# Patient Record
Sex: Female | Born: 1960
Health system: Southern US, Community
[De-identification: ages and names within clinical notes are randomized; demographics above are authoritative.]

## PROBLEM LIST (undated history)

## (undated) DIAGNOSIS — R569 Unspecified convulsions: Secondary | ICD-10-CM

## (undated) DIAGNOSIS — Z789 Other specified health status: Secondary | ICD-10-CM

## (undated) DIAGNOSIS — F209 Schizophrenia, unspecified: Secondary | ICD-10-CM

## (undated) DIAGNOSIS — F419 Anxiety disorder, unspecified: Secondary | ICD-10-CM

## (undated) DIAGNOSIS — I10 Essential (primary) hypertension: Secondary | ICD-10-CM

## (undated) DIAGNOSIS — F70 Mild intellectual disabilities: Secondary | ICD-10-CM

## (undated) DIAGNOSIS — E119 Type 2 diabetes mellitus without complications: Secondary | ICD-10-CM

## (undated) DIAGNOSIS — E785 Hyperlipidemia, unspecified: Secondary | ICD-10-CM

## (undated) DIAGNOSIS — R1310 Dysphagia, unspecified: Secondary | ICD-10-CM

## (undated) HISTORY — DX: Schizophrenia, unspecified: F20.9

## (undated) HISTORY — PX: LEG SURGERY: SHX1003

## (undated) HISTORY — DX: Hyperlipidemia, unspecified: E78.5

## (undated) HISTORY — DX: Anxiety disorder, unspecified: F41.9

## (undated) HISTORY — DX: Type 2 diabetes mellitus without complications: E11.9

---

## 2000-09-13 ENCOUNTER — Inpatient Hospital Stay (HOSPITAL_COMMUNITY): Admission: EM | Admit: 2000-09-13 | Discharge: 2000-09-19 | Payer: Self-pay | Admitting: *Deleted

## 2001-05-02 ENCOUNTER — Ambulatory Visit (HOSPITAL_COMMUNITY): Admission: RE | Admit: 2001-05-02 | Discharge: 2001-05-02 | Payer: Self-pay | Admitting: Obstetrics and Gynecology

## 2001-05-09 ENCOUNTER — Encounter: Payer: Self-pay | Admitting: Obstetrics and Gynecology

## 2001-05-09 ENCOUNTER — Ambulatory Visit (HOSPITAL_COMMUNITY): Admission: RE | Admit: 2001-05-09 | Discharge: 2001-05-09 | Payer: Self-pay | Admitting: Obstetrics and Gynecology

## 2001-08-09 ENCOUNTER — Other Ambulatory Visit: Admission: RE | Admit: 2001-08-09 | Discharge: 2001-08-09 | Payer: Self-pay | Admitting: Obstetrics and Gynecology

## 2003-01-07 ENCOUNTER — Encounter: Admission: RE | Admit: 2003-01-07 | Discharge: 2003-01-07 | Payer: Self-pay | Admitting: Family Medicine

## 2003-01-07 ENCOUNTER — Encounter: Payer: Self-pay | Admitting: Family Medicine

## 2013-12-26 ENCOUNTER — Ambulatory Visit: Payer: Self-pay | Admitting: Gastroenterology

## 2014-01-13 ENCOUNTER — Ambulatory Visit (INDEPENDENT_AMBULATORY_CARE_PROVIDER_SITE_OTHER): Payer: Medicare Other | Admitting: Gastroenterology

## 2014-01-13 ENCOUNTER — Encounter: Payer: Self-pay | Admitting: Gastroenterology

## 2014-01-13 ENCOUNTER — Encounter (INDEPENDENT_AMBULATORY_CARE_PROVIDER_SITE_OTHER): Payer: Self-pay

## 2014-01-13 VITALS — BP 135/80 | HR 87 | Temp 98.4°F | Ht 63.0 in | Wt 154.4 lb

## 2014-01-13 DIAGNOSIS — K625 Hemorrhage of anus and rectum: Secondary | ICD-10-CM | POA: Insufficient documentation

## 2014-01-13 DIAGNOSIS — R1310 Dysphagia, unspecified: Secondary | ICD-10-CM | POA: Insufficient documentation

## 2014-01-13 NOTE — Progress Notes (Signed)
Primary Care Physician:  Toma Deiters, MD Primary Gastroenterologist:  Dr. Darrick Penna   Chief Complaint  Patient presents with  . Colonoscopy    HPI:   Natasha Martin presents today at the request of Coralee Rud, NP, for initial screening colonoscopy. She has a history of schizophrenia and resides at a family care facility in [place]. She is a poor historian and difficult to keep focused on visit at hand.  Occasional low-volume hematochezia. Unable to get accurate picture from patient. Sometimes gets choked on her blue pill. Solid food and liquid dysphagia. X 2 years per patient. No change in bowel habits. No N/V, abdominal pain. Mother is power of attorney.   Past Medical History  Diagnosis Date  . Diabetes   . Hyperlipidemia   . Schizophrenia   . Anxiety     Past Surgical History  Procedure Laterality Date  . 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 aspirin 81 MG tablet Take 81 mg by mouth daily.      Marland Kitchen atenolol (TENORMIN) 25 MG tablet Take 25 mg by mouth daily.       . benztropine (COGENTIN) 2 MG tablet Take 2 mg by mouth daily.       . carbamazepine (TEGRETOL) 200 MG tablet Take 200 mg by mouth 3 (three) times daily.       . clotrimazole-betamethasone (LOTRISONE) cream Apply 1 application topically.       . CRESTOR 20 MG tablet Take 20 mg by mouth daily.       . diazepam (VALIUM) 10 MG tablet Take 10 mg by mouth every 6 (six) hours as needed.       . fenofibrate (TRICOR) 145 MG tablet Take 145 mg by mouth daily.       Marland Kitchen glimepiride (AMARYL) 4 MG tablet Take 4 mg by mouth daily with breakfast.       . glyBURIDE (DIABETA) 5 MG tablet 5 mg daily with breakfast.       . haloperidol (HALDOL) 10 MG tablet Take 10 mg by mouth 2 (two) times daily.       Marland Kitchen LORazepam (ATIVAN) 1 MG tablet Take 1 mg by mouth.       . metFORMIN (GLUCOPHAGE) 1000 MG tablet 1,000 mg 2 (two) times daily  with a meal.       . nortriptyline (PAMELOR) 50 MG capsule Take 50 mg by mouth at bedtime.       . Omega-3 Fatty Acids (OMEGA-3 FISH OIL PO) Take 1,000 mg by mouth 3 (three) times daily.      . QUEtiapine (SEROQUEL) 200 MG tablet 200 mg. Four tablets at bedtime      . ramipril (ALTACE) 10 MG capsule Take 10 mg by mouth daily.       . sucralfate (CARAFATE) 1 G tablet Take 1 g by mouth 2 (two) times daily.       . traZODone (DESYREL) 100 MG tablet Take 100 mg by mouth at bedtime.       No current facility-administered medications for this visit.    Allergies as of 01/13/2014  . (No Known Allergies)    Family History  Problem Relation Age of Onset  . Colon cancer      unknown    History   Social History  . Marital Status: Legally Separated  Spouse Name: N/A    Number of Children: N/A  . Years of Education: N/A   Occupational History  . Not on file.   Social History Main Topics  . Smoking status: Never Smoker   . Smokeless tobacco: Not on file  . Alcohol Use: No     Comment: history of ETOH abuse  . Drug Use: No  . Sexual Activity: No   Other Topics Concern  . Not on file   Social History Narrative  . No narrative on file    Review of Systems: As mentioned in HPI.   Physical Exam: BP 135/80  Pulse 87  Temp(Src) 98.4 F (36.9 C) (Oral)  Ht 5\' 3"  (1.6 m)  Wt 154 lb 6.4 oz (70.035 kg)  BMI 27.36 kg/m2 General:   Alert and oriented to person Head:  Normocephalic and atraumatic. Eyes:  Without icterus, sclera clear and conjunctiva pink.  Ears:  Normal auditory acuity. Nose:  No deformity, discharge,  or lesions. Mouth:  Poor dentition, oral mucosa pink.  Neck:  Supple, without mass or thyromegaly. Lungs:  Clear to auscultation bilaterally. No wheezes, rales, or rhonchi. No distress.  Heart:  S1, S2 present without murmurs appreciated.  Abdomen:  +BS, soft, non-tender and non-distended. Obese, no appreciable HSM Rectal:  Deferred until colonoscopy  Msk:   Symmetrical without gross deformities. Normal posture. Extremities:  Without clubbing or edema. Neurologic:  Alert and  oriented to person. Skin:  Intact without significant lesions or rashes. Psych:  Alert and cooperative.

## 2014-01-13 NOTE — Patient Instructions (Signed)
We have scheduled you for a colonoscopy and upper endoscopy with dilation with Dr. Darrick PennaFields in the near future.  You will need to bring your mom with you to sign the consent form.

## 2014-01-15 ENCOUNTER — Telehealth: Payer: Self-pay

## 2014-01-15 ENCOUNTER — Other Ambulatory Visit: Payer: Self-pay | Admitting: Gastroenterology

## 2014-01-15 DIAGNOSIS — R1319 Other dysphagia: Secondary | ICD-10-CM

## 2014-01-15 DIAGNOSIS — Z1211 Encounter for screening for malignant neoplasm of colon: Secondary | ICD-10-CM

## 2014-01-15 MED ORDER — PEG 3350-KCL-NA BICARB-NACL 420 G PO SOLR: 4000.0000 mL | ORAL | Status: DC

## 2014-01-15 NOTE — Assessment & Plan Note (Signed)
Poor historian; notes choking on "blue pill" and solid food dysphagia. Appears chronic. Question underlying motility disorder. Unable to rule out occult web, ring, or stricture.  Proceed with upper endoscopy and dilation at time of TCS.Marland Kitchen. The risks, benefits, and alternatives have been discussed in detail with patient. They have stated understanding and desire to proceed.  Mother to sign consent.

## 2014-01-15 NOTE — Assessment & Plan Note (Signed)
53 year old female with schizophrenia, poor historian, presenting with need for initial screening colonoscopy and reports of intermittent low-volume hematochezia. Likely benign anorectal source. Mother is power of attorney and will need to sign for consents. Due to polypharmacy, will need Propofol for procedures.   Proceed with colonoscopy with Dr. Darrick PennaFields in the near future. The risks, benefits, and alternatives have been discussed in detail with the patient. They state understanding and desire to proceed.  Propofol for sedation Mother will need to sign consent.

## 2014-01-15 NOTE — Telephone Encounter (Signed)
I called the home where pt lives. I asked who was her power of attorney and the caregiver asked the pt. She said it was her mother and gave them her name and phone number from memory. ( I could hear her in the back ground)  Mother: Natasha Martin Phone: [phone]  I called her mom. She said that the pt has been making her own decisions. She said she cannot help because she has a husband at home with dementia and she cannot leave him. She wanted to know when it would be and I told her it would be a Tues ( pt to be done in OR with Dr. Darrick PennaFields).   She said she has a granddaughter that comes and stays once a week a few hours on Wed for her to go to the grocery store.  She does not know of anyone she could get to help.

## 2014-01-15 NOTE — Telephone Encounter (Signed)
Is it possible to do a verbal consent the day of the procedure?  Let's check with Melanie in endo.

## 2014-01-15 NOTE — Telephone Encounter (Signed)
Verbal Consent can be given by the patients mother Per Shawna OrleansMelanie in Endo, I have Ms. Rauda Scheduled w/[facility] in the OR on Tuesday March 31st and I have faxed her instructions to her caregiver and she is aware

## 2014-01-16 NOTE — Progress Notes (Signed)
cc'd to pcp 

## 2014-01-17 ENCOUNTER — Encounter (HOSPITAL_COMMUNITY): Payer: Self-pay | Admitting: Pharmacy Technician

## 2014-01-21 ENCOUNTER — Encounter (HOSPITAL_COMMUNITY): Payer: Self-pay

## 2014-01-21 ENCOUNTER — Encounter (HOSPITAL_COMMUNITY)
Admission: RE | Admit: 2014-01-21 | Discharge: 2014-01-21 | Disposition: A | Discharge status: Home / Self Care | Payer: Medicare Other | Source: Ambulatory Visit | Attending: Gastroenterology | Admitting: Gastroenterology

## 2014-01-21 ENCOUNTER — Other Ambulatory Visit: Payer: Self-pay

## 2014-01-21 DIAGNOSIS — Z01812 Encounter for preprocedural laboratory examination: Secondary | ICD-10-CM | POA: Insufficient documentation

## 2014-01-21 DIAGNOSIS — Z01810 Encounter for preprocedural cardiovascular examination: Secondary | ICD-10-CM | POA: Insufficient documentation

## 2014-01-21 HISTORY — DX: Essential (primary) hypertension: I10

## 2014-01-21 HISTORY — DX: Dysphagia, unspecified: R13.10

## 2014-01-21 HISTORY — DX: Mild intellectual disabilities: F70

## 2014-01-21 HISTORY — DX: Unspecified convulsions: R56.9

## 2014-01-21 HISTORY — DX: Other specified health status: Z78.9

## 2014-01-21 LAB — BASIC METABOLIC PANEL
BUN: 13 mg/dL (ref 6–23)
CHLORIDE: 89 meq/L — AB (ref 96–112)
CO2: 24 meq/L (ref 19–32)
CREATININE: 0.74 mg/dL (ref 0.50–1.10)
Calcium: 9.4 mg/dL (ref 8.4–10.5)
GFR calc Af Amer: >90 mL/min (ref >90)
GFR calc non Af Amer: >90 mL/min (ref >90)
GLUCOSE: 203 mg/dL — AB (ref 70–99)
POTASSIUM: 4.4 meq/L (ref 3.7–5.3)
Sodium: 131 mEq/L — ABNORMAL LOW (ref 137–147)

## 2014-01-21 LAB — HEMOGLOBIN AND HEMATOCRIT, BLOOD
HEMATOCRIT: 35.5 % — AB (ref 36.0–46.0)
Hemoglobin: 12.4 g/dL (ref 12.0–15.0)

## 2014-01-21 NOTE — Pre-Procedure Instructions (Signed)
Patient has no access to computer.

## 2014-01-21 NOTE — Patient Instructions (Signed)
Natasha Martin  01/21/2014   Your procedure is scheduled on:   01/28/2014  Report to [facility] at  615  AM.  Call this number if you have problems the morning of surgery: [phone]   Remember:   Do not eat food or drink liquids after midnight.   Take these medicines the morning of surgery with A SIP OF WATER: atenolol, cogentin, tegretol, valium, haldol, altace   Do not wear jewelry, make-up or nail polish.  Do not wear lotions, powders, or perfumes.  Do not shave 48 hours prior to surgery. Men may shave face and neck.  Do not bring valuables to the hospital.  [facility] is not responsible for any belongings or valuables.               Contacts, dentures or bridgework may not be worn into surgery.  Leave suitcase in the car. After surgery it may be brought to your room.  For patients admitted to the hospital, discharge time is determined by your treatment team.               Patients discharged the day of surgery will not be allowed to drive home.  Name and phone number of your driver: family  Special Instructions: N/A   Please read over the following fact sheets that you were given: Pain Booklet, Coughing and Deep Breathing, Surgical Site Infection Prevention, Anesthesia Post-op Instructions and Care and Recovery After Surgery Colonoscopy A colonoscopy is an exam to look at the entire large intestine (colon). This exam can help find problems such as tumors, polyps, inflammation, and areas of bleeding. The exam takes about 1 hour.  LET [facility] PROVIDER KNOW ABOUT:   Any allergies you have.  All medicines you are taking, including vitamins, herbs, eye drops, creams, and over-the-counter medicines.  Previous problems you or members of your family have had with the use of anesthetics.  Any blood disorders you have.  Previous surgeries you have had.  Medical conditions you have. RISKS AND COMPLICATIONS  Generally, this is a safe procedure. However, as  with any procedure, complications can occur. Possible complications include:  Bleeding.  Tearing or rupture of the colon wall.  Reaction to medicines given during the exam.  Infection (rare). BEFORE THE PROCEDURE   Ask your health care provider about changing or stopping your regular medicines.  You may be prescribed an oral bowel prep. This involves drinking a large amount of medicated liquid, starting the day before your procedure. The liquid will cause you to have multiple loose stools until your stool is almost clear or light green. This cleans out your colon in preparation for the procedure.  Do not eat or drink anything else once you have started the bowel prep, unless your health care provider tells you it is safe to do so.  Arrange for someone to drive you home after the procedure. PROCEDURE   You will be given medicine to help you relax (sedative).  You will lie on your side with your knees bent.  A long, flexible tube with a light and camera on the end (colonoscope) will be inserted through the rectum and into the colon. The camera sends video back to a computer screen as it moves through the colon. The colonoscope also releases carbon dioxide gas to inflate the colon. This helps your health care provider see the area better.  During the exam, your health care provider may take  a small tissue sample (biopsy) to be examined under a microscope if any abnormalities are found.  The exam is finished when the entire colon has been viewed. AFTER THE PROCEDURE   Do not drive for 24 hours after the exam.  You may have a small amount of blood in your stool.  You may pass moderate amounts of gas and have mild abdominal cramping or bloating. This is caused by the gas used to inflate your colon during the exam.  Ask when your test results will be ready and how you will get your results. Make sure you get your test results. Document Released: 10/14/2000 Document Revised: 08/07/2013  Document Reviewed: 06/24/2013 [facility] Patient Information 2014 [place]. Esophagogastroduodenoscopy Esophagogastroduodenoscopy (EGD) is a procedure to examine the lining of the esophagus, stomach, and first part of the small intestine (duodenum). A long, flexible, lighted tube with a camera attached (endoscope) is inserted down the throat to view these organs. This procedure is done to detect problems or abnormalities, such as inflammation, bleeding, ulcers, or growths, in order to treat them. The procedure lasts about 5 20 minutes. It is usually an outpatient procedure, but it may need to be performed in emergency cases in the hospital. LET YOUR CAREGIVER KNOW ABOUT:   Allergies to food or medicine.  All medicines you are taking, including vitamins, herbs, eyedrops, and over-the-counter medicines and creams.  Use of steroids (by mouth or creams).  Previous problems you or members of your family have had with the use of anesthetics.  Any blood disorders you have.  Previous surgeries you have had.  Other health problems you have.  Possibility of pregnancy, if this applies. RISKS AND COMPLICATIONS  Generally, EGD is a safe procedure. However, as with any procedure, complications can occur. Possible complications include:  Infection.  Bleeding.  Tearing (perforation) of the esophagus, stomach, or duodenum.  Difficulty breathing or not being able to breath.  Excessive sweating.  Spasms of the larynx.  Slowed heartbeat.  Low blood pressure. BEFORE THE PROCEDURE  Do not eat or drink anything for 6 8 hours before the procedure or as directed by your caregiver.  Ask your caregiver about changing or stopping your regular medicines.  If you wear dentures, be prepared to remove them before the procedure.  Arrange for someone to drive you home after the procedure. PROCEDURE   A vein will be accessed to give medicines and fluids. A medicine to relax you (sedative) and a  pain reliever will be given through that access into the vein.  A numbing medicine (local anesthetic) may be sprayed on your throat for comfort and to stop you from gagging or coughing.  A mouth guard may be placed in your mouth to protect your teeth and to keep you from biting on the endoscope.  You will be asked to lie on your left side.  The endoscope is inserted down your throat and into the esophagus, stomach, and duodenum.  Air is put through the endoscope to allow your caregiver to view the lining of your esophagus clearly.  The esophagus, stomach, and duodenum is then examined. During the exam, your caregiver may:  Remove tissue to be examined under a microscope (biopsy) for inflammation, infection, or other medical problems.  Remove growths.  Remove objects (foreign bodies) that are stuck.  Treat any bleeding with medicines or other devices that stop tissues from bleeding (hot cauters, clipping devices).  Widen (dilate) or stretch narrowed areas of the esophagus and stomach.  The  endoscope will then be withdrawn. AFTER THE PROCEDURE  You will be taken to a recovery area to be monitored. You will be able to go home once you are stable and alert.  Do not eat or drink anything until the local anesthetic and numbing medicines have worn off. You may choke.  It is normal to feel bloated, have pain with swallowing, or have a sore throat for a short time. This will wear off.  Your caregiver should be able to discuss his or her findings with you. It will take longer to discuss the test results if any biopsies were taken. Document Released: 02/17/2005 Document Revised: 10/03/2012 Document Reviewed: 09/19/2012 [facility] Patient Information 2014 [place], [place]. PATIENT INSTRUCTIONS POST-ANESTHESIA  IMMEDIATELY FOLLOWING SURGERY:  Do not drive or operate machinery for the first twenty four hours after surgery.  Do not make any important decisions for twenty four hours after surgery  or while taking narcotic pain medications or sedatives.  If you develop intractable nausea and vomiting or a severe headache please notify your doctor immediately.  FOLLOW-UP:  Please make an appointment with your surgeon as instructed. You do not need to follow up with anesthesia unless specifically instructed to do so.  WOUND CARE INSTRUCTIONS (if applicable):  Keep a dry clean dressing on the anesthesia/puncture wound site if there is drainage.  Once the wound has quit draining you may leave it open to air.  Generally you should leave the bandage intact for twenty four hours unless there is drainage.  If the epidural site drains for more than 36-48 hours please call the anesthesia department.  QUESTIONS?:  Please feel free to call your physician or the hospital operator if you have any questions, and they will be happy to assist you.

## 2014-01-28 ENCOUNTER — Encounter (HOSPITAL_COMMUNITY): Payer: Self-pay

## 2014-01-28 ENCOUNTER — Ambulatory Visit (HOSPITAL_COMMUNITY): Payer: Medicare Other | Admitting: Anesthesiology

## 2014-01-28 ENCOUNTER — Ambulatory Visit (HOSPITAL_COMMUNITY)
Admission: RE | Admit: 2014-01-28 | Discharge: 2014-01-28 | Disposition: A | Discharge status: Home / Self Care | Payer: Medicare Other | Source: Ambulatory Visit | Attending: Gastroenterology | Admitting: Gastroenterology

## 2014-01-28 ENCOUNTER — Encounter (HOSPITAL_COMMUNITY)
Admission: RE | Disposition: A | Discharge status: Home / Self Care | Payer: Self-pay | Source: Ambulatory Visit | Attending: Gastroenterology

## 2014-01-28 ENCOUNTER — Encounter (HOSPITAL_COMMUNITY): Payer: Medicare Other | Admitting: Anesthesiology

## 2014-01-28 DIAGNOSIS — E785 Hyperlipidemia, unspecified: Secondary | ICD-10-CM | POA: Insufficient documentation

## 2014-01-28 DIAGNOSIS — K2960 Other gastritis without bleeding: Secondary | ICD-10-CM | POA: Insufficient documentation

## 2014-01-28 DIAGNOSIS — K3189 Other diseases of stomach and duodenum: Secondary | ICD-10-CM

## 2014-01-28 DIAGNOSIS — K222 Esophageal obstruction: Secondary | ICD-10-CM | POA: Diagnosis not present

## 2014-01-28 DIAGNOSIS — K648 Other hemorrhoids: Secondary | ICD-10-CM | POA: Insufficient documentation

## 2014-01-28 DIAGNOSIS — K621 Rectal polyp: Secondary | ICD-10-CM

## 2014-01-28 DIAGNOSIS — Q438 Other specified congenital malformations of intestine: Secondary | ICD-10-CM | POA: Insufficient documentation

## 2014-01-28 DIAGNOSIS — K625 Hemorrhage of anus and rectum: Secondary | ICD-10-CM | POA: Insufficient documentation

## 2014-01-28 DIAGNOSIS — Z7982 Long term (current) use of aspirin: Secondary | ICD-10-CM | POA: Insufficient documentation

## 2014-01-28 DIAGNOSIS — K2100 Gastro-esophageal reflux disease with esophagitis, without bleeding: Secondary | ICD-10-CM | POA: Insufficient documentation

## 2014-01-28 DIAGNOSIS — K922 Gastrointestinal hemorrhage, unspecified: Secondary | ICD-10-CM

## 2014-01-28 DIAGNOSIS — F411 Generalized anxiety disorder: Secondary | ICD-10-CM | POA: Insufficient documentation

## 2014-01-28 DIAGNOSIS — K644 Residual hemorrhoidal skin tags: Secondary | ICD-10-CM | POA: Insufficient documentation

## 2014-01-28 DIAGNOSIS — E119 Type 2 diabetes mellitus without complications: Secondary | ICD-10-CM | POA: Insufficient documentation

## 2014-01-28 DIAGNOSIS — I10 Essential (primary) hypertension: Secondary | ICD-10-CM | POA: Insufficient documentation

## 2014-01-28 DIAGNOSIS — R1013 Epigastric pain: Secondary | ICD-10-CM

## 2014-01-28 DIAGNOSIS — R1310 Dysphagia, unspecified: Secondary | ICD-10-CM | POA: Insufficient documentation

## 2014-01-28 DIAGNOSIS — K620 Anal polyp: Secondary | ICD-10-CM

## 2014-01-28 HISTORY — PX: COLONOSCOPY WITH PROPOFOL: SHX5780

## 2014-01-28 HISTORY — PX: SAVORY DILATION: SHX5439

## 2014-01-28 HISTORY — PX: ESOPHAGOGASTRODUODENOSCOPY (EGD) WITH PROPOFOL: SHX5813

## 2014-01-28 HISTORY — PX: BIOPSY: SHX5522

## 2014-01-28 HISTORY — PX: POLYPECTOMY: SHX5525

## 2014-01-28 LAB — HCG, SERUM, QUALITATIVE: Preg, Serum: NEGATIVE

## 2014-01-28 LAB — GLUCOSE, RANDOM: Glucose, Bld: 172 mg/dL — ABNORMAL HIGH (ref 70–99)

## 2014-01-28 SURGERY — COLONOSCOPY WITH PROPOFOL
Anesthesia: Monitor Anesthesia Care

## 2014-01-28 MED ORDER — PROPOFOL INFUSION 10 MG/ML OPTIME
INTRAVENOUS | Status: DC | PRN
  Administered 2014-01-28 (×3): 150 ug/kg/min via INTRAVENOUS

## 2014-01-28 MED ORDER — PROPOFOL 10 MG/ML IV BOLUS
INTRAVENOUS | Status: AC
  Filled 2014-01-28: qty 20

## 2014-01-28 MED ORDER — ONDANSETRON HCL 4 MG/2ML IJ SOLN
4.0000 mg | Freq: Once | INTRAMUSCULAR | Status: AC
  Administered 2014-01-28: 4 mg via INTRAVENOUS

## 2014-01-28 MED ORDER — OMEPRAZOLE 20 MG PO CPDR: DELAYED_RELEASE_CAPSULE | ORAL | Status: DC

## 2014-01-28 MED ORDER — MIDAZOLAM HCL 2 MG/2ML IJ SOLN
1.0000 mg | INTRAMUSCULAR | Status: DC | PRN
  Administered 2014-01-28: 2 mg via INTRAVENOUS

## 2014-01-28 MED ORDER — FENTANYL CITRATE 0.05 MG/ML IJ SOLN
INTRAMUSCULAR | Status: AC
  Filled 2014-01-28: qty 2

## 2014-01-28 MED ORDER — LIDOCAINE VISCOUS 2 % MT SOLN
OROMUCOSAL | Status: AC
  Filled 2014-01-28: qty 15

## 2014-01-28 MED ORDER — LIDOCAINE VISCOUS 2 % MT SOLN
OROMUCOSAL | Status: DC | PRN
  Administered 2014-01-28: 4 mL via OROMUCOSAL

## 2014-01-28 MED ORDER — GLYCOPYRROLATE 0.2 MG/ML IJ SOLN
0.2000 mg | Freq: Once | INTRAMUSCULAR | Status: AC
Start: 2014-01-28 — End: 2014-01-28
  Administered 2014-01-28: 0.2 mg via INTRAVENOUS

## 2014-01-28 MED ORDER — FENTANYL CITRATE 0.05 MG/ML IJ SOLN: 25.0000 ug | INTRAMUSCULAR | Status: DC | PRN

## 2014-01-28 MED ORDER — WATER FOR IRRIGATION, STERILE IR SOLN
Status: DC | PRN
  Administered 2014-01-28: 1000 mL

## 2014-01-28 MED ORDER — LIDOCAINE HCL (PF) 1 % IJ SOLN
INTRAMUSCULAR | Status: AC
  Filled 2014-01-28: qty 5

## 2014-01-28 MED ORDER — GLYCOPYRROLATE 0.2 MG/ML IJ SOLN
INTRAMUSCULAR | Status: AC
  Filled 2014-01-28: qty 1

## 2014-01-28 MED ORDER — FENTANYL CITRATE 0.05 MG/ML IJ SOLN
25.0000 ug | INTRAMUSCULAR | Status: AC
  Administered 2014-01-28: 25 ug via INTRAVENOUS

## 2014-01-28 MED ORDER — STERILE WATER FOR IRRIGATION IR SOLN
Status: DC | PRN
  Administered 2014-01-28: 08:00:00

## 2014-01-28 MED ORDER — ONDANSETRON HCL 4 MG/2ML IJ SOLN
INTRAMUSCULAR | Status: AC
  Filled 2014-01-28: qty 2

## 2014-01-28 MED ORDER — MINERAL OIL LIGHT 100 % EX OIL
TOPICAL_OIL | CUTANEOUS | Status: DC | PRN
  Administered 2014-01-28: 1 via TOPICAL

## 2014-01-28 MED ORDER — LIDOCAINE HCL (CARDIAC) 10 MG/ML IV SOLN
INTRAVENOUS | Status: DC | PRN
  Administered 2014-01-28: 20 mg via INTRAVENOUS

## 2014-01-28 MED ORDER — LACTATED RINGERS IV SOLN
INTRAVENOUS | Status: DC
  Administered 2014-01-28: 07:00:00 via INTRAVENOUS

## 2014-01-28 MED ORDER — METOPROLOL TARTRATE 1 MG/ML IV SOLN
5.0000 mg | Freq: Once | INTRAVENOUS | Status: AC
  Administered 2014-01-28: 3 mg via INTRAVENOUS

## 2014-01-28 MED ORDER — METOPROLOL TARTRATE 1 MG/ML IV SOLN
INTRAVENOUS | Status: AC
  Filled 2014-01-28: qty 5

## 2014-01-28 MED ORDER — ONDANSETRON HCL 4 MG/2ML IJ SOLN: 4.0000 mg | Freq: Once | INTRAMUSCULAR | Status: DC | PRN

## 2014-01-28 MED ORDER — MINERAL OIL PO OIL
TOPICAL_OIL | ORAL | Status: AC
  Filled 2014-01-28: qty 30

## 2014-01-28 MED ORDER — PROPOFOL 10 MG/ML IV BOLUS
INTRAVENOUS | Status: DC | PRN
  Administered 2014-01-28 (×4): 7.1 mg via INTRAVENOUS

## 2014-01-28 MED ORDER — MIDAZOLAM HCL 2 MG/2ML IJ SOLN
INTRAMUSCULAR | Status: AC
  Filled 2014-01-28: qty 2

## 2014-01-28 SURGICAL SUPPLY — 25 items
BLOCK BITE 60FR ADLT L/F BLUE (MISCELLANEOUS) ×4 IMPLANT
ELECT REM PT RETURN 9FT ADLT (ELECTROSURGICAL)
ELECTRODE REM PT RTRN 9FT ADLT (ELECTROSURGICAL) IMPLANT
FCP BXJMBJMB 240X2.8X (CUTTING FORCEPS)
FLOOR PAD 36X40 (MISCELLANEOUS) ×4
FORCEPS BIOP RAD 4 LRG CAP 4 (CUTTING FORCEPS) ×8 IMPLANT
FORCEPS BIOP RJ4 240 W/NDL (CUTTING FORCEPS)
FORCEPS BXJMBJMB 240X2.8X (CUTTING FORCEPS) IMPLANT
FORMALIN 10 PREFIL 20ML (MISCELLANEOUS) IMPLANT
INJECTOR/SNARE I SNARE (MISCELLANEOUS) IMPLANT
KIT CLEAN ENDO COMPLIANCE (KITS) ×4 IMPLANT
LUBRICANT JELLY 4.5OZ STERILE (MISCELLANEOUS) ×4 IMPLANT
MANIFOLD NEPTUNE II (INSTRUMENTS) ×4 IMPLANT
NEEDLE SCLEROTHERAPY 25GX240 (NEEDLE) IMPLANT
PAD FLOOR 36X40 (MISCELLANEOUS) ×2 IMPLANT
PROBE APC STR FIRE (PROBE) IMPLANT
PROBE INJECTION GOLD (MISCELLANEOUS)
PROBE INJECTION GOLD 7FR (MISCELLANEOUS) IMPLANT
SNARE ROTATE MED OVAL 20MM (MISCELLANEOUS) IMPLANT
SYR 50ML LL SCALE MARK (SYRINGE) ×4 IMPLANT
SYR INFLATION 60ML (SYRINGE) IMPLANT
TRAP SPECIMEN MUCOUS 40CC (MISCELLANEOUS) IMPLANT
TUBING ENDO SMARTCAP PENTAX (MISCELLANEOUS) ×4 IMPLANT
TUBING IRRIGATION ENDOGATOR (MISCELLANEOUS) ×4 IMPLANT
WATER STERILE IRR 1000ML POUR (IV SOLUTION) ×4 IMPLANT

## 2014-01-28 NOTE — Transfer of Care (Signed)
Immediate Anesthesia Transfer of Care Note  Patient: Natasha Martin  Procedure(s) Performed: Procedure(s) (LRB): COLONOSCOPY WITH PROPOFOL (at cecum at 0749 total withdrawal time=3025minutes) (N/A) ESOPHAGOGASTRODUODENOSCOPY (EGD) WITH PROPOFOL (N/A) SAVORY DILATION  (12.228mm to 16mm) (N/A) POLYPECTOMY (Rectal) BIOPSY (Gastric)  Patient Location: PACU  Anesthesia Type: MAC  Level of Consciousness: awake  Airway & Oxygen Therapy: Patient Spontanous Breathing.   Post-op Assessment: Report given to PACU RN, Post -op Vital signs reviewed and stable and Patient moving all extremities  Post vital signs: Reviewed and stable  Complications: No apparent anesthesia complications

## 2014-01-28 NOTE — Anesthesia Preprocedure Evaluation (Signed)
Anesthesia Evaluation  Patient identified by MRN, date of birth, ID band Patient awake    Reviewed: Allergy & Precautions, H&P , NPO status , Patient's Chart, lab work & pertinent test results, reviewed documented beta blocker date and time   Airway Mallampati: III TM Distance: >3 FB Neck ROM: Full    Dental  (+) Teeth Intact, Implants, Dental Advisory Given   Pulmonary neg pulmonary ROS,  breath sounds clear to auscultation        Cardiovascular hypertension, Pt. on medications and Pt. on home beta blockers Rhythm:Regular Rate:Normal     Neuro/Psych Seizures -, Well Controlled,  PSYCHIATRIC DISORDERS Anxiety Schizophrenia    GI/Hepatic Dysphagia    Endo/Other  diabetes, Type 2, Oral Hypoglycemic Agents  Renal/GU      Musculoskeletal   Abdominal   Peds  Hematology   Anesthesia Other Findings   Reproductive/Obstetrics                           Anesthesia Physical Anesthesia Plan  ASA: III  Anesthesia Plan: MAC   Post-op Pain Management:    Induction: Intravenous  Airway Management Planned: Simple Face Mask  Additional Equipment:   Intra-op Plan:   Post-operative Plan:   Informed Consent: I have reviewed the patients History and Physical, chart, labs and discussed the procedure including the risks, benefits and alternatives for the proposed anesthesia with the patient or authorized representative who has indicated his/her understanding and acceptance.     Plan Discussed with:   Anesthesia Plan Comments:         Anesthesia Quick Evaluation

## 2014-01-28 NOTE — H&P (Signed)
Primary Care Physician:  PROVIDER NOT IN SYSTEM Primary Gastroenterologist:  Dr. Darrick PennaFields  Pre-Procedure History & Physical: HPI:  Natasha Martin is a 53 y.o. female here for DYSPHAGIA/brbpr.  Past Medical History  Diagnosis Date  . Diabetes   . Hyperlipidemia   . Schizophrenia   . Anxiety   . Hypertension   . Seizures     has not had a seizure since last year. Unknown orgin- no family available to come and patient does not know.  Marland Kitchen. Dysphagia   . Mild mental slowing   . Poor historian     Past Surgical History  Procedure Laterality Date  . 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 Authorizing Provider  aspirin 81 MG tablet Take 81 mg by mouth daily.   Yes Historical Provider, MD  atenolol (TENORMIN) 25 MG tablet Take 25 mg by mouth daily.  01/08/14  Yes Historical Provider, MD  benztropine (COGENTIN) 2 MG tablet Take 2 mg by mouth 2 (two) times daily.  01/08/14  Yes Historical Provider, MD  calcium-vitamin D (OSCAL WITH D) 500-200 MG-UNIT per tablet Take 1 tablet by mouth daily.   Yes Historical Provider, MD  carbamazepine (TEGRETOL) 200 MG tablet Take 400 mg by mouth 2 (two) times daily.  01/08/14  Yes Historical Provider, MD  clotrimazole-betamethasone (LOTRISONE) cream Apply 1 application topically.  11/25/13  Yes Historical Provider, MD  CRESTOR 20 MG tablet Take 20 mg by mouth daily.  01/08/14  Yes Historical Provider, MD  diazepam (VALIUM) 10 MG tablet Take 10 mg by mouth every 6 (six) hours as needed.  01/08/14  Yes Historical Provider, MD  fenofibrate (TRICOR) 145 MG tablet Take 145 mg by mouth daily.  01/08/14  Yes Historical Provider, MD  glimepiride (AMARYL) 4 MG tablet Take 4 mg by mouth 2 (two) times daily.  01/08/14  Yes Historical Provider, MD  glyBURIDE (DIABETA) 5 MG tablet Take 2.5 mg by mouth 2 (two) times daily with a meal.  01/08/14  Yes Historical  Provider, MD  haloperidol (HALDOL) 10 MG tablet Take 40 mg by mouth 2 (two) times daily.  01/08/14  Yes Historical Provider, MD  LORazepam (ATIVAN) 1 MG tablet Take 1 mg by mouth 2 (two) times daily as needed for anxiety.  01/02/14  Yes Historical Provider, MD  metFORMIN (GLUCOPHAGE) 1000 MG tablet 1,000 mg 2 (two) times daily with a meal.  12/12/13  Yes Historical Provider, MD  Multiple Vitamins-Minerals (MULTIVITAMINS THER. W/MINERALS) TABS tablet Take 1 tablet by mouth daily.   Yes Historical Provider, MD  nortriptyline (PAMELOR) 50 MG capsule Take 100 mg by mouth at bedtime.  01/08/14  Yes Historical Provider, MD  Omega-3 Fatty Acids (OMEGA-3 FISH OIL PO) Take 1,000 mg by mouth 3 (three) times daily.   Yes Historical Provider, MD  QUEtiapine (SEROQUEL) 200 MG tablet Take 800 mg by mouth at bedtime. Four tablets at bedtime 01/08/14  Yes Historical Provider, MD  ramipril (ALTACE) 10 MG capsule Take 10 mg by mouth daily.  01/08/14  Yes Historical Provider, MD  sucralfate (CARAFATE) 1 G tablet Take 1 g by mouth 3 (three) times daily.  01/08/14  Yes Historical Provider, MD  traZODone (DESYREL) 100 MG tablet Take 100 mg by mouth at bedtime as needed for sleep.    Yes Historical Provider, MD    Allergies as of  01/15/2014  . (No Known Allergies)    Family History  Problem Relation Age of Onset  . Colon cancer      unknown    History   Social History  . Marital Status: Legally Separated    Spouse Name: N/A    Number of Children: N/A  . Years of Education: N/A   Occupational History  . Not on file.   Social History Main Topics  . Smoking status: Never Smoker   . Smokeless tobacco: Not on file  . Alcohol Use: No     Comment: history of ETOH abuse  . Drug Use: No  . Sexual Activity: No   Other Topics Concern  . Not on file   Social History Narrative  . No narrative on file    Review of Systems: See HPI, otherwise negative ROS   Physical Exam: BP 142/70  Pulse 86  Temp(Src)  98.4 F (36.9 C) (Oral)  Resp 17  Wt 157 lb (71.215 kg)  SpO2 97% General:   Alert,  pleasant and cooperative in NAD Head:  Normocephalic and atraumatic. Neck:  Supple; Lungs:  Clear throughout to auscultation.    Heart:  Regular rate and rhythm. Abdomen:  Soft, nontender and nondistended. Normal bowel sounds, without guarding, and without rebound.   Neurologic:  Alert and  oriented x4;  grossly normal neurologically.  Impression/Plan:     DYSPHAGIA/brbpr PLAN:  TCS/EGD/DIL TODAY

## 2014-01-28 NOTE — Op Note (Signed)
[facility] [address] [place], [id number]   ENDOSCOPY PROCEDURE REPORT  PATIENT: Natasha Martin, Natasha S.  MR#: [id number] BIRTHDATE: 01-11-1961 , 52  yrs. old GENDER: Female  ENDOSCOPIST: Jonette EvaSandi Orva Riles, MD REFFERED NW:GNFABY:Xaje Hasanaj, M.D.  PROCEDURE DATE:  01/28/2014 PROCEDURE:   EGD with biopsy and with dilatation over guidewire  INDICATIONS:1.  dyspepsia.   2.  dysphagia. MEDICATIONS: MAC sedation, administered by CRNA TOPICAL ANESTHETIC: Viscous Xylocaine  DESCRIPTION OF PROCEDURE:   After the risks benefits and alternatives of the procedure were thoroughly explained, informed consent was obtained.  The     endoscope was introduced through the mouth and advanced to the second portion of the duodenum. The instrument was slowly withdrawn as the mucosa was carefully examined.  Prior to withdrawal of the scope, the guidwire was placed.  The esophagus was dilated successfully.  The patient was recovered in endoscopy and discharged home in satisfactory condition.   ESOPHAGUS: FREQUENT LINEAR EROSIONS IN MID TO DISTAL ESOPHAGUS.   A stricture was found at the gastroesophageal junction.  The stenosis was traversable with the endoscope.   STOMACH: Moderate erosive gastritis (inflammation) was found in the gastric antrum.  Multiple biopsies were performed using cold forceps.   DUODENUM: The duodenal mucosa showed no abnormalities in the bulb and second portion of the duodenum.   Dilation was then performed at the gastroesphageal junction Dilator: Savary over guidewire Size(s): 12.8-16 MM Resistance: minimal Heme: yes  COMPLICATIONS: There were no complications.  ENDOSCOPIC IMPRESSION: 1.   REFLUX ESOPHAGITIS 2.   Stricture at the gastroesophageal junction 3.   MODERATE Erosive gastritis  RECOMMENDATIONS: START OMEPRAZOLE twice a day for 3 mos then once daily FOREVER. USE CARAFATE AS NEEDED. FOLLOW A HIGH FIBER/LOW FAT DIET.  AVOID ITEMS THAT CAUSE  BLOATING.  BIOPSY WILL BE BACK IN 7 DAYS. Follow up in 4 mos.  Next colonoscopy in 10 years WITH AN OVERTUBE    _______________________________ eSignedJonette Eva:  Hamilton Marinello, MD 01/28/2014 12:31 PM

## 2014-01-28 NOTE — Progress Notes (Signed)
Awake. Swallowing without difficulty. 

## 2014-01-28 NOTE — Op Note (Signed)
[facility] [address] [place], [id number]   COLONOSCOPY PROCEDURE REPORT  PATIENT: Natasha Martin, Natasha S.  MR#: [id number] BIRTHDATE: [birth year] , 52  yrs. old GENDER: Female ENDOSCOPIST: Jonette EvaSandi Meshell Abdulaziz, MD REFERRED KG:MWNUBY:Xaje Hasanaj, M.D. PROCEDURE DATE:  01/28/2014 PROCEDURE:   Colonoscopy with cold biopsy polypectomy INDICATIONS:Rectal Bleeding. MEDICATIONS: MAC sedation, administered by CRNA  DESCRIPTION OF PROCEDURE:    Physical exam was performed.  Informed consent was obtained from the patient after explaining the benefits, risks, and alternatives to procedure.  The patient was connected to monitor and placed in left lateral position. Continuous oxygen was provided by nasal cannula and IV medicine administered through an indwelling cannula.  After administration of sedation and rectal exam, the patients rectum was intubated and the     colonoscope was advanced under direct visualization to the ileum.  The scope was removed slowly by carefully examining the color, texture, anatomy, and integrity mucosa on the way out.  The patient was recovered in endoscopy and discharged home in satisfactory condition.    COLON FINDINGS: The mucosa appeared normal in the terminal ileum.  , The colon is redundant.  Manual abdominal counter-pressure was used to reach the cecum.  The patient was moved on to their back to reach the cecum, A sessile polyp measuring 3 mm in size was found in the rectum.  A polypectomy was performed with cold forceps. Small internal and external hemorrhoids were found.  PREP QUALITY: good. CECAL W/D TIME: 25 minutes     COMPLICATIONS: None  ENDOSCOPIC IMPRESSION: 1.   Normal mucosa in the terminal ileum 2.   The colon is redundant 3.   ONE RECTAL POLYP 4.   Small internal and external hemorrhoids  RECOMMENDATIONS: START OMEPRAZOLE twice a day for 3 mos then once daily FOREVER. USE CARAFATE AS NEEDED. FOLLOW A HIGH FIBER/LOW FAT DIET.  AVOID  ITEMS THAT CAUSE BLOATING.  BIOPSY WILL BE BACK IN 7 DAYS. Follow up in 4 mos.  Next colonoscopy in 10 years WITH AN OVERTUBE       _______________________________ eSignedJonette Eva:  Ilani Otterson, MD 01/28/2014 12:37 PM

## 2014-01-28 NOTE — Progress Notes (Signed)
REVIEWED.  

## 2014-01-28 NOTE — Anesthesia Postprocedure Evaluation (Signed)
Anesthesia Post Note  Patient: Natasha Martin  Procedure(s) Performed: Procedure(s) (LRB): COLONOSCOPY WITH PROPOFOL (at cecum at 0749 total withdrawal time=1725minutes) (N/A) ESOPHAGOGASTRODUODENOSCOPY (EGD) WITH PROPOFOL (N/A) SAVORY DILATION  (12.408mm to 16mm) (N/A) POLYPECTOMY (Rectal) BIOPSY (Gastric)  Anesthesia type: MAC  Patient location: PACU  Post pain: Pain level controlled  Post assessment: Post-op Vital signs reviewed, Patient's Cardiovascular Status Stable, Respiratory Function Stable, Patent Airway, No signs of Nausea or vomiting and Pain level controlled  Last Vitals:  Filed Vitals:   01/28/14 0838  BP: 128/42  Pulse: 82  Temp: 36.8 C  Resp: 18    Post vital signs: Reviewed and stable  Level of consciousness: awake and alert   Complications: No apparent anesthesia complications

## 2014-01-28 NOTE — Discharge Instructions (Signed)
NO OBVIOUS SOURCE FOR YOUR RECTAL BLEEDING WAS IDENTIFIED. You had 1 polyp removed FROM YOUR RECTUM. YOU HAVE A  ESOPHAGITIS FROM ACID REFLUX AND GASTRITIS. I STRETCHED YOUR ESOPHAGUS DUE to a stricture at the bottom of your esophagus. I BIOPSIED YOUR STOMACH.   START OMEPRAZOLE twice a day for 3 mos then once daily.  USE CARAFATE AS NEEDED.  FOLLOW A HIGH FIBER/LOW FAT DIET. AVOID ITEMS THAT CAUSE BLOATING. SEE INFO BELOW.  YOUR BIOPSY WILL BE BACK IN 7 DAYS.  Follow up in 4 mos.  Next colonoscopy in 10 years.    ENDOSCOPY Care After Read the instructions outlined below and refer to this sheet in the next week. These discharge instructions provide you with general information on caring for yourself after you leave the hospital. While your treatment has been planned according to the most current medical practices available, unavoidable complications occasionally occur. If you have any problems or questions after discharge, call DR. Terrilynn Postell, [phone].  ACTIVITY  You may resume your regular activity, but move at a slower pace for the next 24 hours.   Take frequent rest periods for the next 24 hours.   Walking will help get rid of the air and reduce the bloated feeling in your belly (abdomen).   No driving for 24 hours (because of the medicine (anesthesia) used during the test).   You may shower.   Do not sign any important legal documents or operate any machinery for 24 hours (because of the anesthesia used during the test).    NUTRITION  Drink plenty of fluids.   You may resume your normal diet as instructed by your doctor.   Begin with a light meal and progress to your normal diet. Heavy or fried foods are harder to digest and may make you feel sick to your stomach (nauseated).   Avoid alcoholic beverages for 24 hours or as instructed.    MEDICATIONS  You may resume your normal medications.   WHAT YOU CAN EXPECT TODAY  Some feelings of bloating in the abdomen.    Passage of more gas than usual.   Spotting of blood in your stool or on the toilet paper  .  IF YOU HAD POLYPS REMOVED DURING THE ENDOSCOPY:  Eat a soft diet IF YOU HAVE NAUSEA, BLOATING, ABDOMINAL PAIN, OR VOMITING.    FINDING OUT THE RESULTS OF YOUR TEST Not all test results are available during your visit. DR. Darrick PennaFIELDS WILL CALL YOU WITHIN 7 DAYS OF YOUR PROCEDUE WITH YOUR RESULTS. Do not assume everything is normal if you have not heard from DR. Jean Alejos IN ONE WEEK, CALL HER OFFICE AT [phone].  SEEK IMMEDIATE MEDICAL ATTENTION AND CALL THE OFFICE: [phone] IF:  You have more than a spotting of blood in your stool.   Your belly is swollen (abdominal distention).   You are nauseated or vomiting.   You have a temperature over 101F.   You have abdominal pain or discomfort that is severe or gets worse throughout the day.   High-Fiber Diet A high-fiber diet changes your normal diet to include more whole grains, legumes, fruits, and vegetables. Changes in the diet involve replacing refined carbohydrates with unrefined foods. The calorie level of the diet is essentially unchanged. The Dietary Reference Intake (recommended amount) for adult males is 38 grams per day. For adult females, it is 25 grams per day. Pregnant and lactating women should consume 28 grams of fiber per day. Fiber is the intact part of a plant  that is not broken down during digestion. Functional fiber is fiber that has been isolated from the plant to provide a beneficial effect in the body. PURPOSE  Increase stool bulk.   Ease and regulate bowel movements.   Lower cholesterol.  INDICATIONS THAT YOU NEED MORE FIBER  Constipation and hemorrhoids.   Uncomplicated diverticulosis (intestine condition) and irritable bowel syndrome.   Weight management.   As a protective measure against hardening of the arteries (atherosclerosis), diabetes, and cancer.   GUIDELINES FOR INCREASING FIBER IN THE  DIET  Start adding fiber to the diet slowly. A gradual increase of about 5 more grams (2 slices of whole-wheat bread, 2 servings of most fruits or vegetables, or 1 bowl of high-fiber cereal) per day is best. Too rapid an increase in fiber may result in constipation, flatulence, and bloating.   Drink enough water and fluids to keep your urine clear or pale yellow. Water, juice, or caffeine-free drinks are recommended. Not drinking enough fluid may cause constipation.   Eat a variety of high-fiber foods rather than one type of fiber.   Try to increase your intake of fiber through using high-fiber foods rather than fiber pills or supplements that contain small amounts of fiber.   The goal is to change the types of food eaten. Do not supplement your present diet with high-fiber foods, but replace foods in your present diet.  INCLUDE A VARIETY OF FIBER SOURCES  Replace refined and processed grains with whole grains, canned fruits with fresh fruits, and incorporate other fiber sources. White rice, white breads, and most bakery goods contain little or no fiber.   Brown whole-grain rice, buckwheat oats, and many fruits and vegetables are all good sources of fiber. These include: broccoli, Brussels sprouts, cabbage, cauliflower, beets, sweet potatoes, white potatoes (skin on), carrots, tomatoes, eggplant, squash, berries, fresh fruits, and dried fruits.   Cereals appear to be the richest source of fiber. Cereal fiber is found in whole grains and bran. Bran is the fiber-rich outer coat of cereal grain, which is largely removed in refining. In whole-grain cereals, the bran remains. In breakfast cereals, the largest amount of fiber is found in those with "bran" in their names. The fiber content is sometimes indicated on the label.   You may need to include additional fruits and vegetables each day.   In baking, for 1 cup white flour, you may use the following substitutions:   1 cup whole-wheat flour minus  2 tablespoons.   1/2 cup white flour plus 1/2 cup whole-wheat flour.   Low-Fat Diet BREADS, CEREALS, PASTA, RICE, DRIED PEAS, AND BEANS These products are high in carbohydrates and most are low in fat. Therefore, they can be increased in the diet as substitutes for fatty foods. They too, however, contain calories and should not be eaten in excess. Cereals can be eaten for snacks as well as for breakfast.   FRUITS AND VEGETABLES It is good to eat fruits and vegetables. Besides being sources of fiber, both are rich in vitamins and some minerals. They help you get the daily allowances of these nutrients. Fruits and vegetables can be used for snacks and desserts.  MEATS Limit lean meat, chicken, [place], and fish to no more than 6 ounces per day. Beef, Pork, and Lamb Use lean cuts of beef, pork, and lamb. Lean cuts include:  Extra-lean ground beef.  Arm roast.  Sirloin tip.  Center-cut ham.  Round steak.  Loin chops.  Rump roast.  Tenderloin.  Trim  all fat off the outside of meats before cooking. It is not necessary to severely decrease the intake of red meat, but lean choices should be made. Lean meat is rich in protein and contains a highly absorbable form of iron. Premenopausal women, in particular, should avoid reducing lean red meat because this could increase the risk for low red blood cells (iron-deficiency anemia).  Chicken and [place] These are good sources of protein. The fat of poultry can be reduced by removing the skin and underlying fat layers before cooking. Chicken and [place] can be substituted for lean red meat in the diet. Poultry should not be fried or covered with high-fat sauces. Fish and Shellfish Fish is a good source of protein. Shellfish contain cholesterol, but they usually are low in saturated fatty acids. The preparation of fish is important. Like chicken and [place], they should not be fried or covered with high-fat sauces. EGGS Egg whites contain no fat or  cholesterol. They can be eaten often. Try 1 to 2 egg whites instead of whole eggs in recipes or use egg substitutes that do not contain yolk. MILK AND DAIRY PRODUCTS Use skim or 1% milk instead of 2% or whole milk. Decrease whole milk, natural, and processed cheeses. Use nonfat or low-fat (2%) cottage cheese or low-fat cheeses made from vegetable oils. Choose nonfat or low-fat (1 to 2%) yogurt. Experiment with evaporated skim milk in recipes that call for heavy cream. Substitute low-fat yogurt or low-fat cottage cheese for sour cream in dips and salad dressings. Have at least 2 servings of low-fat dairy products, such as 2 glasses of skim (or 1%) milk each day to help get your daily calcium intake. FATS AND OILS Reduce the total intake of fats, especially saturated fat. Butterfat, lard, and beef fats are high in saturated fat and cholesterol. These should be avoided as much as possible. Vegetable fats do not contain cholesterol, but certain vegetable fats, such as coconut oil, palm oil, and palm kernel oil are very high in saturated fats. These should be limited. These fats are often used in bakery goods, processed foods, popcorn, oils, and nondairy creamers. Vegetable shortenings and some peanut butters contain hydrogenated oils, which are also saturated fats. Read the labels on these foods and check for saturated vegetable oils. Unsaturated vegetable oils and fats do not raise blood cholesterol. However, they should be limited because they are fats and are high in calories. Total fat should still be limited to 30% of your daily caloric intake. Desirable liquid vegetable oils are corn oil, cottonseed oil, olive oil, canola oil, safflower oil, soybean oil, and sunflower oil. Peanut oil is not as good, but small amounts are acceptable. Buy a heart-healthy tub margarine that has no partially hydrogenated oils in the ingredients. Mayonnaise and salad dressings often are made from unsaturated fats, but they should  also be limited because of their high calorie and fat content. Seeds, nuts, peanut butter, olives, and avocados are high in fat, but the fat is mainly the unsaturated type. These foods should be limited mainly to avoid excess calories and fat. OTHER EATING TIPS Snacks  Most sweets should be limited as snacks. They tend to be rich in calories and fats, and their caloric content outweighs their nutritional value. Some good choices in snacks are graham crackers, melba toast, soda crackers, bagels (no egg), English muffins, fruits, and vegetables. These snacks are preferable to snack crackers, [place] fries, TORTILLA CHIPS, and POTATO chips. Popcorn should be air-popped or cooked in  small amounts of liquid vegetable oil. Desserts Eat fruit, low-fat yogurt, and fruit ices instead of pastries, cake, and cookies. Sherbet, angel food cake, gelatin dessert, frozen low-fat yogurt, or other frozen products that do not contain saturated fat (pure fruit juice bars, frozen ice pops) are also acceptable.  COOKING METHODS Choose those methods that use little or no fat. They include: Poaching.  Braising.  Steaming.  Grilling.  Baking.  Stir-frying.  Broiling.  Microwaving.  Foods can be cooked in a nonstick pan without added fat, or use a nonfat cooking spray in regular cookware. Limit fried foods and avoid frying in saturated fat. Add moisture to lean meats by using water, broth, cooking wines, and other nonfat or low-fat sauces along with the cooking methods mentioned above. Soups and stews should be chilled after cooking. The fat that forms on top after a few hours in the refrigerator should be skimmed off. When preparing meals, avoid using excess salt. Salt can contribute to raising blood pressure in some people.  EATING AWAY FROM HOME Order entres, potatoes, and vegetables without sauces or butter. When meat exceeds the size of a deck of cards (3 to 4 ounces), the rest can be taken home for another  meal. Choose vegetable or fruit salads and ask for low-calorie salad dressings to be served on the side. Use dressings sparingly. Limit high-fat toppings, such as bacon, crumbled eggs, cheese, sunflower seeds, and olives. Ask for heart-healthy tub margarine instead of butter.    Polyps, Colon  A polyp is extra tissue that grows inside your body. Colon polyps grow in the large intestine. The large intestine, also called the colon, is part of your digestive system. It is a long, hollow tube at the end of your digestive tract where your body makes and stores stool. Most polyps are not dangerous. They are benign. This means they are not cancerous. But over time, some types of polyps can turn into cancer. Polyps that are smaller than a pea are usually not harmful. But larger polyps could someday become or may already be cancerous. To be safe, doctors remove all polyps and test them.   WHO GETS POLYPS? Anyone can get polyps, but certain people are more likely than others. You may have a greater chance of getting polyps if:  You are over 50.   You have had polyps before.   Someone in your family has had polyps.   Someone in your family has had cancer of the large intestine.   Find out if someone in your family has had polyps. You may also be more likely to get polyps if you:   Eat a lot of fatty foods   Smoke   Drink alcohol   Do not exercise  Eat too much   TREATMENT  The caregiver will remove the polyp during sigmoidoscopy or colonoscopy.  PREVENTION There is not one sure way to prevent polyps. You might be able to lower your risk of getting them if you:  Eat more fruits and vegetables and less fatty food.   Do not smoke.   Avoid alcohol.   Exercise every day.   Lose weight if you are overweight.   Eating more calcium and folate can also lower your risk of getting polyps. Some foods that are rich in calcium are milk, cheese, and broccoli. Some foods that are rich in folate are  chickpeas, kidney beans, and spinach.    Gastritis  Gastritis is an inflammation (the body's way of reacting to  injury and/or infection) of the stomach. It is often caused by viral or bacterial (germ) infections. It can also be caused BY ASPIRIN, BC/GOODY POWDER'S, (IBUPROFEN) MOTRIN, OR ALEVE (NAPROXEN), chemicals (including alcohol), SPICY FOODS, and medications. This illness may be associated with generalized malaise (feeling tired, not well), UPPER ABDOMINAL STOMACH cramps, and fever. One common bacterial cause of gastritis is an organism known as H. Pylori. This can be treated with antibiotics.

## 2014-01-28 NOTE — Anesthesia Procedure Notes (Signed)
Procedure Name: MAC Date/Time: 01/28/2014 7:31 AM Performed by: Franco NonesYATES, Maghan Jessee S Pre-anesthesia Checklist: Patient identified, Emergency Drugs available, Suction available, Timeout performed and Patient being monitored Patient Re-evaluated:Patient Re-evaluated prior to inductionOxygen Delivery Method: Non-rebreather mask

## 2014-01-29 ENCOUNTER — Encounter (HOSPITAL_COMMUNITY): Payer: Self-pay | Admitting: Gastroenterology

## 2014-01-30 ENCOUNTER — Telehealth: Payer: Self-pay | Admitting: Gastroenterology

## 2014-01-30 NOTE — Telephone Encounter (Signed)
Please call pt. HER stomach Bx shows gastritis. She had a polypoid lesion removed FROM HER RECTUM and it was benign.    START OMEPRAZOLE twice a day for 3 mos then once daily. USE CARAFATE AS NEEDED. FOLLOW A HIGH FIBER/LOW FAT DIET. AVOID ITEMS THAT CAUSE BLOATING.  Follow up in 4 mos E30 DYSPEPSIA/RECTAL BLEEDING.  Next colonoscopy in 10 years WITH PROPOFOL.

## 2014-02-03 NOTE — Telephone Encounter (Signed)
Faxed results to nursing home

## 2014-02-03 NOTE — Telephone Encounter (Signed)
Reminder in Epic 

## 2014-06-09 ENCOUNTER — Encounter: Payer: Self-pay | Admitting: Gastroenterology

## 2014-12-02 DIAGNOSIS — F250 Schizoaffective disorder, bipolar type: Secondary | ICD-10-CM | POA: Diagnosis not present

## 2014-12-26 DIAGNOSIS — K210 Gastro-esophageal reflux disease with esophagitis: Secondary | ICD-10-CM | POA: Diagnosis not present

## 2014-12-26 DIAGNOSIS — I10 Essential (primary) hypertension: Secondary | ICD-10-CM | POA: Diagnosis not present

## 2014-12-26 DIAGNOSIS — E782 Mixed hyperlipidemia: Secondary | ICD-10-CM | POA: Diagnosis not present

## 2014-12-26 DIAGNOSIS — E119 Type 2 diabetes mellitus without complications: Secondary | ICD-10-CM | POA: Diagnosis not present

## 2015-01-05 DIAGNOSIS — E785 Hyperlipidemia, unspecified: Secondary | ICD-10-CM | POA: Diagnosis not present

## 2015-01-05 DIAGNOSIS — E1165 Type 2 diabetes mellitus with hyperglycemia: Secondary | ICD-10-CM | POA: Diagnosis not present

## 2015-02-08 DIAGNOSIS — K921 Melena: Secondary | ICD-10-CM | POA: Diagnosis not present

## 2015-02-08 DIAGNOSIS — R569 Unspecified convulsions: Secondary | ICD-10-CM | POA: Diagnosis not present

## 2015-02-08 DIAGNOSIS — I2699 Other pulmonary embolism without acute cor pulmonale: Secondary | ICD-10-CM | POA: Diagnosis not present

## 2015-02-08 DIAGNOSIS — R4182 Altered mental status, unspecified: Secondary | ICD-10-CM | POA: Diagnosis not present

## 2015-02-08 DIAGNOSIS — R140 Abdominal distension (gaseous): Secondary | ICD-10-CM | POA: Diagnosis not present

## 2015-02-08 DIAGNOSIS — E119 Type 2 diabetes mellitus without complications: Secondary | ICD-10-CM | POA: Diagnosis not present

## 2015-02-08 DIAGNOSIS — R40241 Glasgow coma scale score 13-15: Secondary | ICD-10-CM | POA: Diagnosis not present

## 2015-02-08 DIAGNOSIS — R404 Transient alteration of awareness: Secondary | ICD-10-CM | POA: Diagnosis not present

## 2015-02-08 DIAGNOSIS — R531 Weakness: Secondary | ICD-10-CM | POA: Diagnosis not present

## 2015-02-08 DIAGNOSIS — R401 Stupor: Secondary | ICD-10-CM | POA: Diagnosis not present

## 2015-02-08 DIAGNOSIS — R4781 Slurred speech: Secondary | ICD-10-CM | POA: Diagnosis not present

## 2015-02-08 DIAGNOSIS — N289 Disorder of kidney and ureter, unspecified: Secondary | ICD-10-CM | POA: Diagnosis not present

## 2015-02-08 DIAGNOSIS — E871 Hypo-osmolality and hyponatremia: Secondary | ICD-10-CM | POA: Diagnosis not present

## 2015-02-08 DIAGNOSIS — E860 Dehydration: Secondary | ICD-10-CM | POA: Diagnosis not present

## 2015-02-09 DIAGNOSIS — F99 Mental disorder, not otherwise specified: Secondary | ICD-10-CM | POA: Diagnosis present

## 2015-02-09 DIAGNOSIS — Z7982 Long term (current) use of aspirin: Secondary | ICD-10-CM | POA: Diagnosis not present

## 2015-02-09 DIAGNOSIS — R140 Abdominal distension (gaseous): Secondary | ICD-10-CM | POA: Diagnosis present

## 2015-02-09 DIAGNOSIS — I4891 Unspecified atrial fibrillation: Secondary | ICD-10-CM | POA: Diagnosis not present

## 2015-02-09 DIAGNOSIS — I6789 Other cerebrovascular disease: Secondary | ICD-10-CM | POA: Diagnosis not present

## 2015-02-09 DIAGNOSIS — R4781 Slurred speech: Secondary | ICD-10-CM | POA: Diagnosis not present

## 2015-02-09 DIAGNOSIS — R4182 Altered mental status, unspecified: Secondary | ICD-10-CM | POA: Diagnosis not present

## 2015-02-09 DIAGNOSIS — E875 Hyperkalemia: Secondary | ICD-10-CM | POA: Diagnosis present

## 2015-02-09 DIAGNOSIS — E871 Hypo-osmolality and hyponatremia: Secondary | ICD-10-CM | POA: Diagnosis present

## 2015-02-09 DIAGNOSIS — Z794 Long term (current) use of insulin: Secondary | ICD-10-CM | POA: Diagnosis not present

## 2015-02-09 DIAGNOSIS — Z885 Allergy status to narcotic agent status: Secondary | ICD-10-CM | POA: Diagnosis not present

## 2015-02-09 DIAGNOSIS — G809 Cerebral palsy, unspecified: Secondary | ICD-10-CM | POA: Diagnosis present

## 2015-02-09 DIAGNOSIS — I2699 Other pulmonary embolism without acute cor pulmonale: Secondary | ICD-10-CM | POA: Diagnosis present

## 2015-02-09 DIAGNOSIS — E119 Type 2 diabetes mellitus without complications: Secondary | ICD-10-CM | POA: Diagnosis not present

## 2015-02-09 DIAGNOSIS — K5669 Other intestinal obstruction: Secondary | ICD-10-CM | POA: Diagnosis not present

## 2015-02-09 DIAGNOSIS — G40409 Other generalized epilepsy and epileptic syndromes, not intractable, without status epilepticus: Secondary | ICD-10-CM | POA: Diagnosis present

## 2015-02-09 DIAGNOSIS — K6389 Other specified diseases of intestine: Secondary | ICD-10-CM | POA: Diagnosis not present

## 2015-02-09 DIAGNOSIS — R8299 Other abnormal findings in urine: Secondary | ICD-10-CM | POA: Diagnosis present

## 2015-02-09 DIAGNOSIS — R404 Transient alteration of awareness: Secondary | ICD-10-CM | POA: Diagnosis not present

## 2015-02-09 DIAGNOSIS — R569 Unspecified convulsions: Secondary | ICD-10-CM | POA: Diagnosis not present

## 2015-02-09 DIAGNOSIS — K562 Volvulus: Secondary | ICD-10-CM | POA: Diagnosis not present

## 2015-02-09 DIAGNOSIS — Z79899 Other long term (current) drug therapy: Secondary | ICD-10-CM | POA: Diagnosis not present

## 2015-02-09 DIAGNOSIS — E860 Dehydration: Secondary | ICD-10-CM | POA: Diagnosis present

## 2015-02-09 DIAGNOSIS — E1165 Type 2 diabetes mellitus with hyperglycemia: Secondary | ICD-10-CM | POA: Diagnosis present

## 2015-02-09 DIAGNOSIS — R0602 Shortness of breath: Secondary | ICD-10-CM | POA: Diagnosis not present

## 2015-02-09 DIAGNOSIS — K921 Melena: Secondary | ICD-10-CM | POA: Diagnosis present

## 2015-02-09 DIAGNOSIS — Z888 Allergy status to other drugs, medicaments and biological substances status: Secondary | ICD-10-CM | POA: Diagnosis not present

## 2015-02-09 DIAGNOSIS — N289 Disorder of kidney and ureter, unspecified: Secondary | ICD-10-CM | POA: Diagnosis present

## 2015-02-11 ENCOUNTER — Inpatient Hospital Stay (HOSPITAL_COMMUNITY): Payer: Medicare Other

## 2015-02-11 ENCOUNTER — Encounter (HOSPITAL_COMMUNITY): Payer: Self-pay | Admitting: *Deleted

## 2015-02-11 ENCOUNTER — Inpatient Hospital Stay (HOSPITAL_COMMUNITY)
Admission: AD | Admit: 2015-02-11 | Discharge: 2015-02-20 | DRG: 308 | Disposition: A | Discharge status: Skilled Nursing Facility | Payer: Medicare Other | Source: Other Acute Inpatient Hospital | Attending: Internal Medicine | Admitting: Internal Medicine

## 2015-02-11 DIAGNOSIS — F419 Anxiety disorder, unspecified: Secondary | ICD-10-CM | POA: Diagnosis present

## 2015-02-11 DIAGNOSIS — N390 Urinary tract infection, site not specified: Secondary | ICD-10-CM | POA: Diagnosis present

## 2015-02-11 DIAGNOSIS — E1165 Type 2 diabetes mellitus with hyperglycemia: Secondary | ICD-10-CM | POA: Diagnosis present

## 2015-02-11 DIAGNOSIS — E875 Hyperkalemia: Secondary | ICD-10-CM | POA: Diagnosis present

## 2015-02-11 DIAGNOSIS — E872 Acidosis: Secondary | ICD-10-CM | POA: Diagnosis present

## 2015-02-11 DIAGNOSIS — K828 Other specified diseases of gallbladder: Secondary | ICD-10-CM | POA: Diagnosis not present

## 2015-02-11 DIAGNOSIS — R791 Abnormal coagulation profile: Secondary | ICD-10-CM | POA: Diagnosis not present

## 2015-02-11 DIAGNOSIS — Z658 Other specified problems related to psychosocial circumstances: Secondary | ICD-10-CM | POA: Diagnosis not present

## 2015-02-11 DIAGNOSIS — K550 Acute vascular disorders of intestine: Secondary | ICD-10-CM | POA: Diagnosis not present

## 2015-02-11 DIAGNOSIS — G40909 Epilepsy, unspecified, not intractable, without status epilepticus: Secondary | ICD-10-CM | POA: Diagnosis present

## 2015-02-11 DIAGNOSIS — F79 Unspecified intellectual disabilities: Secondary | ICD-10-CM | POA: Diagnosis present

## 2015-02-11 DIAGNOSIS — I481 Persistent atrial fibrillation: Secondary | ICD-10-CM | POA: Diagnosis not present

## 2015-02-11 DIAGNOSIS — I4891 Unspecified atrial fibrillation: Secondary | ICD-10-CM | POA: Diagnosis not present

## 2015-02-11 DIAGNOSIS — I10 Essential (primary) hypertension: Secondary | ICD-10-CM | POA: Diagnosis present

## 2015-02-11 DIAGNOSIS — K5150 Left sided colitis without complications: Secondary | ICD-10-CM | POA: Diagnosis present

## 2015-02-11 DIAGNOSIS — K921 Melena: Secondary | ICD-10-CM | POA: Diagnosis not present

## 2015-02-11 DIAGNOSIS — Z86010 Personal history of colonic polyps: Secondary | ICD-10-CM

## 2015-02-11 DIAGNOSIS — D649 Anemia, unspecified: Secondary | ICD-10-CM | POA: Diagnosis present

## 2015-02-11 DIAGNOSIS — K5289 Other specified noninfective gastroenteritis and colitis: Secondary | ICD-10-CM | POA: Diagnosis not present

## 2015-02-11 DIAGNOSIS — G92 Toxic encephalopathy: Secondary | ICD-10-CM | POA: Diagnosis present

## 2015-02-11 DIAGNOSIS — N179 Acute kidney failure, unspecified: Secondary | ICD-10-CM | POA: Diagnosis present

## 2015-02-11 DIAGNOSIS — R7989 Other specified abnormal findings of blood chemistry: Secondary | ICD-10-CM | POA: Diagnosis present

## 2015-02-11 DIAGNOSIS — G9340 Encephalopathy, unspecified: Secondary | ICD-10-CM | POA: Diagnosis not present

## 2015-02-11 DIAGNOSIS — E785 Hyperlipidemia, unspecified: Secondary | ICD-10-CM | POA: Diagnosis present

## 2015-02-11 DIAGNOSIS — K529 Noninfective gastroenteritis and colitis, unspecified: Secondary | ICD-10-CM | POA: Diagnosis present

## 2015-02-11 DIAGNOSIS — R140 Abdominal distension (gaseous): Secondary | ICD-10-CM | POA: Diagnosis present

## 2015-02-11 DIAGNOSIS — K567 Ileus, unspecified: Secondary | ICD-10-CM | POA: Diagnosis present

## 2015-02-11 DIAGNOSIS — K55039 Acute (reversible) ischemia of large intestine, extent unspecified: Secondary | ICD-10-CM | POA: Diagnosis present

## 2015-02-11 DIAGNOSIS — E222 Syndrome of inappropriate secretion of antidiuretic hormone: Secondary | ICD-10-CM | POA: Diagnosis present

## 2015-02-11 DIAGNOSIS — I248 Other forms of acute ischemic heart disease: Secondary | ICD-10-CM | POA: Diagnosis present

## 2015-02-11 DIAGNOSIS — F209 Schizophrenia, unspecified: Secondary | ICD-10-CM | POA: Diagnosis present

## 2015-02-11 DIAGNOSIS — Z800 Family history of malignant neoplasm of digestive organs: Secondary | ICD-10-CM

## 2015-02-11 DIAGNOSIS — I4892 Unspecified atrial flutter: Secondary | ICD-10-CM | POA: Diagnosis not present

## 2015-02-11 DIAGNOSIS — R0902 Hypoxemia: Secondary | ICD-10-CM

## 2015-02-11 DIAGNOSIS — Z79899 Other long term (current) drug therapy: Secondary | ICD-10-CM

## 2015-02-11 DIAGNOSIS — I480 Paroxysmal atrial fibrillation: Secondary | ICD-10-CM | POA: Diagnosis not present

## 2015-02-11 DIAGNOSIS — R198 Other specified symptoms and signs involving the digestive system and abdomen: Secondary | ICD-10-CM | POA: Diagnosis not present

## 2015-02-11 DIAGNOSIS — K7290 Hepatic failure, unspecified without coma: Secondary | ICD-10-CM | POA: Diagnosis present

## 2015-02-11 DIAGNOSIS — E871 Hypo-osmolality and hyponatremia: Secondary | ICD-10-CM | POA: Diagnosis present

## 2015-02-11 DIAGNOSIS — Z7982 Long term (current) use of aspirin: Secondary | ICD-10-CM | POA: Diagnosis not present

## 2015-02-11 DIAGNOSIS — R079 Chest pain, unspecified: Secondary | ICD-10-CM | POA: Diagnosis not present

## 2015-02-11 DIAGNOSIS — R000 Tachycardia, unspecified: Secondary | ICD-10-CM

## 2015-02-11 DIAGNOSIS — E119 Type 2 diabetes mellitus without complications: Secondary | ICD-10-CM | POA: Diagnosis not present

## 2015-02-11 DIAGNOSIS — K922 Gastrointestinal hemorrhage, unspecified: Secondary | ICD-10-CM | POA: Diagnosis not present

## 2015-02-11 DIAGNOSIS — E876 Hypokalemia: Secondary | ICD-10-CM | POA: Diagnosis present

## 2015-02-11 DIAGNOSIS — J9811 Atelectasis: Secondary | ICD-10-CM | POA: Diagnosis not present

## 2015-02-11 DIAGNOSIS — R4182 Altered mental status, unspecified: Secondary | ICD-10-CM | POA: Diagnosis not present

## 2015-02-11 DIAGNOSIS — I483 Typical atrial flutter: Secondary | ICD-10-CM

## 2015-02-11 DIAGNOSIS — T43505A Adverse effect of unspecified antipsychotics and neuroleptics, initial encounter: Secondary | ICD-10-CM | POA: Diagnosis present

## 2015-02-11 DIAGNOSIS — G931 Anoxic brain damage, not elsewhere classified: Secondary | ICD-10-CM | POA: Diagnosis not present

## 2015-02-11 DIAGNOSIS — I482 Chronic atrial fibrillation: Secondary | ICD-10-CM | POA: Diagnosis not present

## 2015-02-11 DIAGNOSIS — Z452 Encounter for adjustment and management of vascular access device: Secondary | ICD-10-CM | POA: Diagnosis not present

## 2015-02-11 DIAGNOSIS — R1084 Generalized abdominal pain: Secondary | ICD-10-CM | POA: Diagnosis not present

## 2015-02-11 LAB — HEPATIC FUNCTION PANEL
ALT: 17 U/L (ref 0–35)
ALT: 17 U/L (ref 0–35)
AST: 21 U/L (ref 0–37)
AST: 19 U/L (ref 0–37)
Albumin: 2.6 g/dL — ABNORMAL LOW (ref 3.5–5.2)
Albumin: 2.5 g/dL — ABNORMAL LOW (ref 3.5–5.2)
Alkaline Phosphatase: 55 U/L (ref 39–117)
Alkaline Phosphatase: 53 U/L (ref 39–117)
BILIRUBIN DIRECT: 0.2 mg/dL (ref 0.0–0.5)
BILIRUBIN INDIRECT: 0.6 mg/dL (ref 0.3–0.9)
BILIRUBIN TOTAL: 0.8 mg/dL (ref 0.3–1.2)
BILIRUBIN TOTAL: 1 mg/dL (ref 0.3–1.2)
Bilirubin, Direct: 0.2 mg/dL (ref 0.0–0.5)
Indirect Bilirubin: 0.8 mg/dL (ref 0.3–0.9)
TOTAL PROTEIN: 5.5 g/dL — AB (ref 6.0–8.3)
Total Protein: 6 g/dL (ref 6.0–8.3)

## 2015-02-11 LAB — BASIC METABOLIC PANEL
Anion gap: 9 (ref 5–15)
BUN: 15 mg/dL (ref 6–23)
CALCIUM: 7.9 mg/dL — AB (ref 8.4–10.5)
CHLORIDE: 102 mmol/L (ref 96–112)
CO2: 26 mmol/L (ref 19–32)
Creatinine, Ser: 0.66 mg/dL (ref 0.50–1.10)
GFR calc Af Amer: >90 mL/min (ref >90)
GFR calc non Af Amer: >90 mL/min (ref >90)
Glucose, Bld: 194 mg/dL — ABNORMAL HIGH (ref 70–99)
Potassium: 3.6 mmol/L (ref 3.5–5.1)
Sodium: 137 mmol/L (ref 135–145)

## 2015-02-11 LAB — CBC
HEMATOCRIT: 31 % — AB (ref 36.0–46.0)
HEMOGLOBIN: 10.6 g/dL — AB (ref 12.0–15.0)
MCH: 31.4 pg (ref 26.0–34.0)
MCHC: 34.2 g/dL (ref 30.0–36.0)
MCV: 91.7 fL (ref 78.0–100.0)
PLATELETS: 172 10*3/uL (ref 150–400)
RBC: 3.38 MIL/uL — ABNORMAL LOW (ref 3.87–5.11)
RDW: 12.8 % (ref 11.5–15.5)
WBC: 3.9 10*3/uL — ABNORMAL LOW (ref 4.0–10.5)

## 2015-02-11 LAB — TROPONIN I: Troponin I: 0.04 ng/mL — ABNORMAL HIGH (ref <0.031)

## 2015-02-11 LAB — TSH: TSH: 1.154 u[IU]/mL (ref 0.350–4.500)

## 2015-02-11 LAB — BLOOD GAS, ARTERIAL
Acid-Base Excess: 1.5 mmol/L (ref 0.0–2.0)
BICARBONATE: 24.8 meq/L — AB (ref 20.0–24.0)
DRAWN BY: 331471
O2 Content: 4 L/min
O2 Saturation: 97.2 %
PCO2 ART: 35.4 mmHg (ref 35.0–45.0)
PH ART: 7.458 — AB (ref 7.350–7.450)
Patient temperature: 98.6
TCO2: 22.7 mmol/L (ref 0–100)
pO2, Arterial: 150 mmHg — ABNORMAL HIGH (ref 80.0–100.0)

## 2015-02-11 LAB — PROCALCITONIN: PROCALCITONIN: 5.07 ng/mL

## 2015-02-11 LAB — CK TOTAL AND CKMB (NOT AT ARMC)
CK, MB: 11.4 ng/mL (ref 0.3–4.0)
RELATIVE INDEX: INVALID (ref 0.0–2.5)
Total CK: 41 U/L (ref 7–177)

## 2015-02-11 LAB — LACTIC ACID, PLASMA
LACTIC ACID, VENOUS: 0.9 mmol/L (ref 0.5–2.0)
Lactic Acid, Venous: 1.7 mmol/L (ref 0.5–2.0)

## 2015-02-11 LAB — AMMONIA: Ammonia: 21 umol/L (ref 11–32)

## 2015-02-11 LAB — GLUCOSE, CAPILLARY: GLUCOSE-CAPILLARY: 161 mg/dL — AB (ref 70–99)

## 2015-02-11 LAB — MRSA PCR SCREENING: MRSA by PCR: NEGATIVE

## 2015-02-11 MED ORDER — SODIUM CHLORIDE 0.9 % IV SOLN
8.0000 mg/h | INTRAVENOUS | Status: DC
  Filled 2015-02-11: qty 80

## 2015-02-11 MED ORDER — AMIODARONE HCL IN DEXTROSE 360-4.14 MG/200ML-% IV SOLN
30.0000 mg/h | INTRAVENOUS | Status: DC
  Administered 2015-02-11 – 2015-02-16 (×11): 30 mg/h via INTRAVENOUS
  Filled 2015-02-11 (×11): qty 200

## 2015-02-11 MED ORDER — ALBUTEROL SULFATE (2.5 MG/3ML) 0.083% IN NEBU: 2.5000 mg | INHALATION_SOLUTION | RESPIRATORY_TRACT | Status: DC | PRN

## 2015-02-11 MED ORDER — SODIUM CHLORIDE 0.9 % IV SOLN
80.0000 mg | Freq: Once | INTRAVENOUS | Status: DC
  Filled 2015-02-11: qty 80

## 2015-02-11 MED ORDER — ONDANSETRON HCL 4 MG PO TABS
4.0000 mg | ORAL_TABLET | Freq: Four times a day (QID) | ORAL | Status: DC | PRN
  Administered 2015-02-19 – 2015-02-20 (×2): 4 mg via ORAL
  Filled 2015-02-11 (×2): qty 1

## 2015-02-11 MED ORDER — DILTIAZEM HCL 100 MG IV SOLR
5.0000 mg/h | INTRAVENOUS | Status: DC
  Administered 2015-02-11: 15 mg/h via INTRAVENOUS
  Administered 2015-02-11: 10 mg/h via INTRAVENOUS
  Administered 2015-02-12 – 2015-02-13 (×5): 15 mg/h via INTRAVENOUS
  Administered 2015-02-14: 5 mg/h via INTRAVENOUS
  Administered 2015-02-14 – 2015-02-16 (×8): 15 mg/h via INTRAVENOUS
  Filled 2015-02-11 (×19): qty 100

## 2015-02-11 MED ORDER — SODIUM CHLORIDE 0.9 % IV SOLN
INTRAVENOUS | Status: AC
  Administered 2015-02-12: 16:00:00 via INTRAVENOUS
  Administered 2015-02-14: 75 mL via INTRAVENOUS

## 2015-02-11 MED ORDER — PANTOPRAZOLE SODIUM 40 MG IV SOLR: 40.0000 mg | Freq: Two times a day (BID) | INTRAVENOUS | Status: DC

## 2015-02-11 MED ORDER — AMIODARONE LOAD VIA INFUSION
150.0000 mg | Freq: Once | INTRAVENOUS | Status: AC
  Administered 2015-02-11: 150 mg via INTRAVENOUS
  Filled 2015-02-11: qty 83.34

## 2015-02-11 MED ORDER — METRONIDAZOLE IN NACL 5-0.79 MG/ML-% IV SOLN
500.0000 mg | Freq: Four times a day (QID) | INTRAVENOUS | Status: DC
  Administered 2015-02-11 – 2015-02-14 (×12): 500 mg via INTRAVENOUS
  Filled 2015-02-11 (×12): qty 100

## 2015-02-11 MED ORDER — ONDANSETRON HCL 4 MG/2ML IJ SOLN
4.0000 mg | Freq: Four times a day (QID) | INTRAMUSCULAR | Status: DC | PRN
  Administered 2015-02-18 – 2015-02-20 (×4): 4 mg via INTRAVENOUS
  Filled 2015-02-11 (×4): qty 2

## 2015-02-11 MED ORDER — AMIODARONE IV BOLUS ONLY 150 MG/100ML
INTRAVENOUS | Status: AC
  Administered 2015-02-11: 150 mg
  Filled 2015-02-11: qty 100

## 2015-02-11 MED ORDER — AMIODARONE HCL IN DEXTROSE 360-4.14 MG/200ML-% IV SOLN
60.0000 mg/h | INTRAVENOUS | Status: AC
  Administered 2015-02-11: 60 mg/h via INTRAVENOUS
  Filled 2015-02-11 (×2): qty 200

## 2015-02-11 MED ORDER — PIPERACILLIN-TAZOBACTAM 3.375 G IVPB
3.3750 g | Freq: Three times a day (TID) | INTRAVENOUS | Status: DC
  Administered 2015-02-11 – 2015-02-16 (×15): 3.375 g via INTRAVENOUS
  Filled 2015-02-11 (×15): qty 50

## 2015-02-11 MED ORDER — IOHEXOL 300 MG/ML  SOLN
100.0000 mL | Freq: Once | INTRAMUSCULAR | Status: AC | PRN
  Administered 2015-02-11: 100 mL via INTRAVENOUS

## 2015-02-11 NOTE — Progress Notes (Signed)
eLink Physician-Brief Progress Note Patient Name: Natasha Martin DOB: Jan 03, 1961 MRN: [id number]   Date of Service  02/11/2015  HPI/Events of Note  Called d/t multiple issues: 1. Review Head, Abdomen/Pelvis CT Scans, 2. Patient wants soda and 3. Troponin #1 = 0.04.  eICU Interventions  Head CT Scan negative for intracranial pathology. CT Scan Abdomen and Pelvis - 1. Colitis extending from the mid transverse colon through the rectum. Findings may be infectious or inflammatory. There is no perforation or abscess. 2. Dilated and fluid-filled small bowel loops in the central and lower abdomen. This may be reactive ileus versus small bowel enteritis. 3. Small amount of free fluid in the pelvis and perisplenic space.  Will order ice chips. Trend troponin.     Intervention Category Intermediate Interventions: Diagnostic test evaluation Minor Interventions: Communication with other healthcare providers and/or family  Lenell AntuSommer,Latrell Potempa [place] 02/11/2015, 11:05 PM

## 2015-02-11 NOTE — Consult Note (Signed)
PULMONARY / CRITICAL CARE MEDICINE   Name: Natasha Martin MRN: [id number] DOB: 08/15/1961    ADMISSION DATE:  02/11/2015 CONSULTATION DATE:  4/13 REFERRING MD :  Tat CHIEF COMPLAINT:  Encephalopathy   INITIAL PRESENTATION:  This is a 54 year old female w/ history for MR since birth, seizure d.o, schizophrenia, HTN and DM . [facility] for evaluation of encephalopathy. Dx eval demonstrated AKI, Na 125, abd pain and hyperkalemia. There was also question of UTI so treated for that. On 4/12 developed new onset of AF w/ RVR as well as worsening Abd swelling. CT abd w/ possible colitis. Transferred to [facility] on 4/13 as MS not improved and remained in AF w/ RVR.   STUDIES:  CT head 4/9: neg (per report) CT abd/pelvis 4/11:  chronic wall thickening from mid transverse to descending colon c/w possible colitis    SIGNIFICANT EVENTS:    HISTORY OF PRESENT ILLNESS:  See above   PAST MEDICAL HISTORY :   has a past medical history of Diabetes; Hyperlipidemia; Schizophrenia; Anxiety; Hypertension; Seizures; Dysphagia; Mild mental slowing; and Poor historian.  has past surgical history that includes Leg Surgery (Bilateral); Colonoscopy with propofol (N/A, 01/28/2014); Esophagogastroduodenoscopy (egd) with propofol (N/A, 01/28/2014); Savory dilation (N/A, 01/28/2014); polypectomy (01/28/2014); and esophageal biopsy (01/28/2014). Prior to Admission medications   Medication Sig Start Date End Date Taking? Authorizing Provider  aspirin 81 MG tablet Take 81 mg by mouth daily.    Historical Provider, MD  atenolol (TENORMIN) 25 MG tablet Take 25 mg by mouth daily.  01/08/14   Historical Provider, MD  benztropine (COGENTIN) 2 MG tablet Take 2 mg by mouth 2 (two) times daily.  01/08/14   Historical Provider, MD  calcium-vitamin D (OSCAL WITH D) 500-200 MG-UNIT per tablet Take 1 tablet by mouth daily.    Historical Provider, MD  carbamazepine (TEGRETOL) 200 MG tablet Take 400 mg by mouth 2 (two)  times daily.  01/08/14   Historical Provider, MD  clotrimazole-betamethasone (LOTRISONE) cream Apply 1 application topically.  11/25/13   Historical Provider, MD  CRESTOR 20 MG tablet Take 20 mg by mouth daily.  01/08/14   Historical Provider, MD  diazepam (VALIUM) 10 MG tablet Take 10 mg by mouth every 6 (six) hours as needed.  01/08/14   Historical Provider, MD  fenofibrate (TRICOR) 145 MG tablet Take 145 mg by mouth daily.  01/08/14   Historical Provider, MD  glimepiride (AMARYL) 4 MG tablet Take 4 mg by mouth 2 (two) times daily.  01/08/14   Historical Provider, MD  glyBURIDE (DIABETA) 5 MG tablet Take 2.5 mg by mouth 2 (two) times daily with a meal.  01/08/14   Historical Provider, MD  haloperidol (HALDOL) 10 MG tablet Take 40 mg by mouth 2 (two) times daily.  01/08/14   Historical Provider, MD  LORazepam (ATIVAN) 1 MG tablet Take 1 mg by mouth 2 (two) times daily as needed for anxiety.  01/02/14   Historical Provider, MD  metFORMIN (GLUCOPHAGE) 1000 MG tablet 1,000 mg 2 (two) times daily with a meal.  12/12/13   Historical Provider, MD  Multiple Vitamins-Minerals (MULTIVITAMINS THER. W/MINERALS) TABS tablet Take 1 tablet by mouth daily.    Historical Provider, MD  nortriptyline (PAMELOR) 50 MG capsule Take 100 mg by mouth at bedtime.  01/08/14   Historical Provider, MD  Omega-3 Fatty Acids (OMEGA-3 FISH OIL PO) Take 1,000 mg by mouth 3 (three) times daily.    Historical Provider, MD  omeprazole (  PRILOSEC) 20 MG capsule 1 po bid 30 minutes before meals for 3 mos then once daily FOREVER 01/28/14   West Bali, MD  QUEtiapine (SEROQUEL) 200 MG tablet Take 800 mg by mouth at bedtime. Four tablets at bedtime 01/08/14   Historical Provider, MD  ramipril (ALTACE) 10 MG capsule Take 10 mg by mouth daily.  01/08/14   Historical Provider, MD  traZODone (DESYREL) 100 MG tablet Take 100 mg by mouth at bedtime as needed for sleep.     Historical Provider, MD   No Known Allergies  FAMILY HISTORY:  has no family  status information on file.  SOCIAL HISTORY:  reports that she has never smoked. She has never used smokeless tobacco. She reports that she does not drink alcohol or use illicit drugs.  REVIEW OF SYSTEMS:  Unable  SUBJECTIVE:  Lethargic   VITAL SIGNS: Temp:  [98.3 F (36.8 C)-99.4 F (37.4 C)] 99.4 F (37.4 C) (04/13 1530) Pulse Rate:  [146-152] 152 (04/13 1530) Resp:  [17-19] 17 (04/13 1530) BP: (137-156)/(68-79) 137/68 mmHg (04/13 1530) SpO2:  [100 %] 100 % (04/13 1530) Weight:  [75.9 kg (167 lb 5.3 oz)] 75.9 kg (167 lb 5.3 oz) (04/13 1400) HEMODYNAMICS:   VENTILATOR SETTINGS:   INTAKE / OUTPUT: No intake or output data in the 24 hours ending 02/11/15 1621  PHYSICAL EXAMINATION: General:  54 year old female, somnolent, difficult to arouse   Neuro:  Oriented X1, very weak, attempts to localize.  HEENT:  NGT in nare. MMM no JVD  Cardiovascular:  Tachy irreg  Lungs:  Clear and decreased in bases Abdomen:  Distended, hypoactive. Some pain to palp  Musculoskeletal:  Generalized weakness  Skin:  intact  LABS:  CBC  Recent Labs Lab 02/11/15 1525  WBC 3.9*  HGB 10.6*  HCT 31.0*  PLT 172   Coag's No results for input(s): APTT, INR in the last 168 hours. BMET  Recent Labs Lab 02/11/15 1525  NA 137  K 3.6  CL 102  CO2 26  BUN 15  CREATININE 0.66  GLUCOSE 194*   Electrolytes  Recent Labs Lab 02/11/15 1525  CALCIUM 7.9*   Sepsis Markers  Recent Labs Lab 02/11/15 1525  LATICACIDVEN 1.7   ABG  Recent Labs Lab 02/11/15 1515  PHART 7.458*  PCO2ART 35.4  PO2ART 150.0*   Liver Enzymes  Recent Labs Lab 02/11/15 1525  AST 21  ALT 17  ALKPHOS 55  BILITOT 0.8  ALBUMIN 2.6*   Cardiac Enzymes No results for input(s): TROPONINI, PROBNP in the last 168 hours. Glucose No results for input(s): GLUCAP in the last 168 hours.  Imaging No results found.   ASSESSMENT / PLAN:  PULMONARY OETT A: No acute. At risk for acute resp failure in  setting of AMS P:   Pulse ox Repeat cxr s/p CVL  CARDIOVASCULAR CVL A:  New on set AF w/ RVR P:  amio bolus and gtt Cycle CEs Fluid challenge  Ck lactic acid  ECHO  RENAL A:   AKI Metabolic acidosis (on presentation lactic acid was 3.3) Hyponatremia (suspect r/t hypovolemia as responded to volume)  P:   Repeat lactic acid  CK BMP Total CKs  GASTROINTESTINAL A:   abd pain CT abd at [facility]: chronic wall thickening from mid transverse to descending colon c/w possible colitis  P:   NPO Cont NGT to NIGW Ck C diff PCR Ck LFTs CT abd/pelvis ordered per recs of surg  Don't think PPI gtt warranted  HEMATOLOGIC A:   Tarry stools  Anemia of critical illness  P:  Place PAS PPI F/u CBC  INFECTIOUS A:   Colitis ? R/o infectious vs ischemic  Reported UTI P:   BCx2 4/13>>> UC 4/13>>> Zosyn 4/13> Flagyl 4/13>>>  ENDOCRINE A:   DM  P:   ssi   NEUROLOGIC A:  Acute encephalopathy: ? Metabolic vs post-ictal vs CVA  H/o seizure d/o H/o chronic cognitive delay  Schizophrenia  P:   RASS goal: 0 Hold all sedating meds EEG CT head ordered; prob needs MRI ammonia and lactic acid   FAMILY  - Updates:      TODAY'S SUMMARY:  Admitted to [facility] for further evaluation of encephalopathy. Ddx new CVA in setting AF but seems non-focal, subclinical seizure or simply residual metabolic acidosis. Her initial Na was 125, it was 134 at time of dc after NS resuscitation. (doubt this is still contributing). She remains in AF. Abd more distended w/ CT from [facility] suggesting possible abd colitis. Surgery wanted repeat CT abd/pelvis per IM service. IM service ordered CT chest. Doubt this is PE. CT head ordered, although suspect she will need an MRI when we think that it is safe to do this. Currently she is protecting her airway.   Simonne Martinet ACNP-BC [facility] Pulmonary/Critical Care Pager # [phone] OR # [phone] if no answer   02/11/2015, 4:21 PM   Attending:  I  have seen and examined the patient with nurse practitioner/resident and agree with the note above.   On my exam Ms. Loudin has increased work of breathing, clear lungs, some confusion but able to say her name.  She as some abdominal tenderness.  Chart reviewed including H&P, cardiology and gi consultants notes.   She has colitis on the CT with little stool output.  Etiology of colitis uncertain, appreciate GI input.    Colitis> continue supportive care, check for c.diff, gi consult Afib with RVR> we will place CVL for IV amiodarone, no anticoagulation per cardiology Elevated D-dimer> no signs of swelling on leg exam, as we would not anticoagulate with GI bleed I will cancel the CT chest and order a LE doppler to look for DVT Lactic acidosis> continue volume resuscitation as she is volume deplete  Family updated beside by Anders Simmonds and me.  My cc time 40 minutes  Heber Otwell, MD  PCCM Pager: [phone] Cell: [phone] If no response, call [phone]

## 2015-02-11 NOTE — Procedures (Signed)
Central Venous Catheter Insertion Procedure Note Retta Macaula S Ehrhard [id number] September 16, 1961  Procedure: Insertion of Central Venous Catheter Indications: Assessment of intravascular volume, Drug and/or fluid administration and Frequent blood sampling  Procedure Details Consent: Risks of procedure as well as the alternatives and risks of each were explained to the (patient/caregiver).  Consent for procedure obtained. Time Out: Verified patient identification, verified procedure, site/side was marked, verified correct patient position, special equipment/implants available, medications/allergies/relevent history reviewed, required imaging and test results available.  Performed Real time US used to ID and cannulate vessel  Maximum sterile technique was used including antiseptics, cap, gloves, gown, hand hygiene, mask and sheet. Skin prep: Chlorhexidine; local anesthetic administered A antimicrobial bonded/coated triple lumen catheter was placed in the right internal jugular vein using the Seldinger technique.  Evaluation Blood flow good Complications: No apparent complications Patient did tolerate procedure well. Chest X-ray ordered to verify placement.  CXR: pending.  Danyle Boening,PETE 02/11/2015, 5:10 PM

## 2015-02-11 NOTE — Progress Notes (Signed)
Dr Tat page to inform that pt arrived to unit he informed me that Dr. Randol KernElgergawy would be admitting him. Dr. Randol KernElgergawy paged and informed that no orders were in computer that pt was in Afib c RVR in 150's, cardizem gtt has run dry from [facility] and I am unable to pull cardizem gtt from pyxis. MD ordered cardizem and to continue amiodarone gtt that was infusing upon arrival. MD also made aware of pt distended abdomen with coffee ground dark emesis coming out of NG tube and that pt was lethargic. No other orders given at this time. Will continue to monitor.

## 2015-02-11 NOTE — H&P (Signed)
Patient Demographics  Natasha Martin, is a 54 y.o. female  MRN: [id number]   DOB - 02-Dec-1960  Admit Date - 02/11/2015  Outpatient Primary MD for the patient is PROVIDER NOT IN SYSTEM   With History of -  Past Medical History  Diagnosis Date  . Diabetes   . Hyperlipidemia   . Schizophrenia   . Anxiety   . Hypertension   . Seizures     has not had a seizure since last year. Unknown orgin- no family available to come and patient does not know.  Marland Kitchen Dysphagia   . Mild mental slowing   . Poor historian       Past Surgical History  Procedure Laterality Date  . 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 Colonoscopy with propofol N/A 01/28/2014    Procedure: COLONOSCOPY WITH PROPOFOL (at cecum at 0749 total withdrawal time=38minutes);  Surgeon: West Bali, MD;  Location: [facility];  Service: Endoscopy;  Laterality: N/A;  . Esophagogastroduodenoscopy (egd) with propofol N/A 01/28/2014    Procedure: ESOPHAGOGASTRODUODENOSCOPY (EGD) WITH PROPOFOL;  Surgeon: West Bali, MD;  Location: [facility];  Service: Endoscopy;  Laterality: N/A;  . Savory dilation N/A 01/28/2014    Procedure: SAVORY DILATION  (12.48mm to 16mm);  Surgeon: West Bali, MD;  Location: [facility];  Service: Endoscopy;  Laterality: N/A;  . Polypectomy  01/28/2014    Procedure: POLYPECTOMY (Rectal);  Surgeon: West Bali, MD;  Location: [facility];  Service: Endoscopy;;  . Esophageal biopsy  01/28/2014    Procedure: BIOPSY (Gastric);  Surgeon: West Bali, MD;  Location: [facility];  Service: Endoscopy;;    in for   No chief complaint on file.    HPI  Natasha Martin  is a 54 y.o. female, with  who lives in a group home secondary to developmental delay( questionable MR), admitted to [facility] on 02/09/15 for altered mental status, CT head was negative on admission , negative urinalysis, negative toxicology panel, when a tree me of 125 on admission, CT abdomen pelvis without contrast  was done for distended abdomen( without contrast given elevated creatinine of 1.9 on admission), showing continuous wall thickening extended from mid transverse to descending colon, patient developed A. fib with RVR, started on amiodarone and Cardizem drip, acute renal failure resolved with creatinine improved from 1.9 to 0,8 , 12 patient was noticed to have melena and tarry stool on physical exam, her d-dimer's were elevated at 14 as well, [facility] requested transfer to [facility] for further management, patient was noticed to have tarry stool at [facility], continues to have deterioration of her mental status, patient is currently very lethargic, can give any history or review of system.    Review of Systems    In addition to the HPI above,  - Patient can't give any  review of system given she is lethargic.     Social History History  Substance Use Topics  . Smoking status: Never Smoker   . Smokeless tobacco: Not on file  . Alcohol Use: No     Comment: history of ETOH abuse     Family History Family History  Problem Relation Age of Onset  . Colon cancer      unknown     Prior to Admission medications   Medication Sig Start Date End Date Taking? Authorizing Provider  aspirin 81 MG tablet Take 81 mg by  mouth daily.    Historical Provider, MD  atenolol (TENORMIN) 25 MG tablet Take 25 mg by mouth daily.  01/08/14   Historical Provider, MD  benztropine (COGENTIN) 2 MG tablet Take 2 mg by mouth 2 (two) times daily.  01/08/14   Historical Provider, MD  calcium-vitamin D (OSCAL WITH D) 500-200 MG-UNIT per tablet Take 1 tablet by mouth daily.    Historical Provider, MD  carbamazepine (TEGRETOL) 200 MG tablet Take 400 mg by mouth 2 (two) times daily.  01/08/14   Historical Provider, MD  clotrimazole-betamethasone (LOTRISONE) cream Apply 1 application topically.  11/25/13   Historical Provider, MD  CRESTOR 20 MG tablet Take 20 mg by mouth daily.  01/08/14   Historical  Provider, MD  diazepam (VALIUM) 10 MG tablet Take 10 mg by mouth every 6 (six) hours as needed.  01/08/14   Historical Provider, MD  fenofibrate (TRICOR) 145 MG tablet Take 145 mg by mouth daily.  01/08/14   Historical Provider, MD  glimepiride (AMARYL) 4 MG tablet Take 4 mg by mouth 2 (two) times daily.  01/08/14   Historical Provider, MD  glyBURIDE (DIABETA) 5 MG tablet Take 2.5 mg by mouth 2 (two) times daily with a meal.  01/08/14   Historical Provider, MD  haloperidol (HALDOL) 10 MG tablet Take 40 mg by mouth 2 (two) times daily.  01/08/14   Historical Provider, MD  LORazepam (ATIVAN) 1 MG tablet Take 1 mg by mouth 2 (two) times daily as needed for anxiety.  01/02/14   Historical Provider, MD  metFORMIN (GLUCOPHAGE) 1000 MG tablet 1,000 mg 2 (two) times daily with a meal.  12/12/13   Historical Provider, MD  Multiple Vitamins-Minerals (MULTIVITAMINS THER. W/MINERALS) TABS tablet Take 1 tablet by mouth daily.    Historical Provider, MD  nortriptyline (PAMELOR) 50 MG capsule Take 100 mg by mouth at bedtime.  01/08/14   Historical Provider, MD  Omega-3 Fatty Acids (OMEGA-3 FISH OIL PO) Take 1,000 mg by mouth 3 (three) times daily.    Historical Provider, MD  omeprazole (PRILOSEC) 20 MG capsule 1 po bid 30 minutes before meals for 3 mos then once daily FOREVER 01/28/14   West Bali, MD  QUEtiapine (SEROQUEL) 200 MG tablet Take 800 mg by mouth at bedtime. Four tablets at bedtime 01/08/14   Historical Provider, MD  ramipril (ALTACE) 10 MG capsule Take 10 mg by mouth daily.  01/08/14   Historical Provider, MD  traZODone (DESYREL) 100 MG tablet Take 100 mg by mouth at bedtime as needed for sleep.     Historical Provider, MD    No Known Allergies  Physical Exam  Vitals  Blood pressure 156/79, pulse 148, temperature 98.3 F (36.8 C), temperature source Oral, resp. rate 19, height  (1.549 m), weight 75.9 kg (167 lb 5.3 oz), last menstrual period 01/21/2014, SpO2 100 %.    General frail appearing  female lying in bed in NAD, minimally responsive to painful stimuli.   Appears to be moving all extremities in response to painful stimuli.   Ears and Eyes appear Normal, Conjunctivae clear,  a.   Supple Neck, No JVD, No cervical lymphadenopathy appriciated, No Carotid Bruits.  Symmetrical Chest wall movement, Good air movement bilaterally, CTAB.   Tachycardic, No Gallops, Rubs or Murmurs, No Parasternal Heave.  Diminished Bowel Sounds, Abdomen distended, No tenderness , No organomegaly appriciated,No rebound -guarding or rigidity.   No Cyanosis, Normal Skin Turgor, No Skin Rash or Bruise.    joints appear normal ,  no effusions,    No Palpable Lymph Nodes in Neck or Axillae    Data Review  CBC No results for input(s): WBC, HGB, HCT, PLT, MCV, MCH, MCHC, RDW, LYMPHSABS, MONOABS, EOSABS, BASOSABS, BANDABS in the last 168 hours.  Invalid input(s): NEUTRABS, BANDSABD ------------------------------------------------------------------------------------------------------------------  Chemistries  No results for input(s): NA, K, CL, CO2, GLUCOSE, BUN, CREATININE, CALCIUM, MG, AST, ALT, ALKPHOS, BILITOT in the last 168 hours.  Invalid input(s): GFRCGP ------------------------------------------------------------------------------------------------------------------ CrCl cannot be calculated (Patient has no serum creatinine result on file.). ------------------------------------------------------------------------------------------------------------------ No results for input(s): TSH, T4TOTAL, T3FREE, THYROIDAB in the last 72 hours.  Invalid input(s): FREET3   Coagulation profile No results for input(s): INR, PROTIME in the last 168 hours. ------------------------------------------------------------------------------------------------------------------- No results for input(s): DDIMER in the last 72  hours. -------------------------------------------------------------------------------------------------------------------  Cardiac Enzymes No results for input(s): CKMB, TROPONINI, MYOGLOBIN in the last 168 hours.  Invalid input(s): CK ------------------------------------------------------------------------------------------------------------------ Invalid input(s): POCBNP   ---------------------------------------------------------------------------------------------------------------  Urinalysis No results found for: COLORURINE, APPEARANCEUR, LABSPEC, PHURINE, GLUCOSEU, HGBUR, BILIRUBINUR, KETONESUR, PROTEINUR, UROBILINOGEN, NITRITE, LEUKOCYTESUR  ----------------------------------------------------------------------------------------------------------------  Imaging results:   No results found.      Assessment & Plan  Active Problems:   Atrial fibrillation with RVR   GI bleed   Abdominal distention   Noninfectious gastroenteritis and colitis   Diabetes mellitus type 2 in nonobese   Acute encephalopathy    Altered mental status - Unclear etiology, most likely related to metabolic region, will repeat CT head, ammonia level, TSH. - We'll check EEG. - Keep nothing by mouth.  Abdominal distention/ colitis/ GI bleed. - Most likely related to colitis, unclear if related to  ischemic colitis given her new onset A. Fib, and the distribution of colitis from mid transverse to descending colon. - We'll check CT abdomen and pelvis with IV contrast. - Check C. difficile by PCR. - Requested surgical and gastric logic consult. - Patient presents with melena, has tarry stool, will start on Protonix drip.  A. fib with RVR - EKG showing a flutter. - Continue with amiodarone drip. - Cardiology consulted.  Type 2 diabetes mellitus - We'll monitor CBGs, if elevated will start on sliding scale  Elevated d-dimer's - [facility] (normal is 0.49),  will obtain CT chest angiogram to rule out PE.  DVT Prophylaxis Heparin -  Lovenox - SCDs   AM Labs Ordered, also please review Full Orders  Family Communication: Admission, patients condition and plan of care including tests being ordered have been discussed with the sister, niece and mother who indicate understanding and agree with the plan and Code Status.  Code Status full  Likely DC to maintain stepdown  Condition GUARDED  /critical  Time spent in minutes : 65 minutes    ELGERGAWY, DAWOOD M.D on 02/11/2015 at 3:36 PM  Between 7am to 7pm - Pager - [phone]  After 7pm go to [email or web address] - password [email or web address]  And look for the night coverage person covering me after hours  [facility] Office  [phone]   **Disclaimer: This note may have been dictated with voice recognition software. Similar sounding words can inadvertently be transcribed and this note may contain transcription errors which may not have been corrected upon publication of note.**

## 2015-02-11 NOTE — Consult Note (Signed)
Patient ID: Natasha Martin MRN: [id number] DOB/AGE: 52-Jul-1962 52 y.o.  Admit date: 02/11/2015 Referring Physician: Elgergawy Primary Cardiologist: New Reason for Consultation: Atrial flutter with RVR  HPI: 54 yo female with history of DM, HTN, HLD, schizophrenia, mental retardation who lives in a group home and is transferred to [facility] from [facility] for further management of GI bleeding, altered mental status and atrial flutter. She was admitted to [facility] 02/09/15 with altered mental status. CT head negative for acute events. She was started on an amiodarone drip for reported atrial fibrillation with RVR. She has now developed melena with a distended abdomen. History is obtained from the chart.   She is currently minimally responsive. She does react to noxious stimuli. Family reports no prior arrhythmias or heart problems.    Past Medical History  Diagnosis Date  . Diabetes   . Hyperlipidemia   . Schizophrenia   . Anxiety   . Hypertension   . Seizures     has not had a seizure since last year. Unknown orgin- no family available to come and patient does not know.  Marland Kitchen Dysphagia   . Mild mental slowing   . Poor historian     Family History  Problem Relation Age of Onset  . Colon cancer      unknown    History   Social History  . Marital Status: Legally Separated    Spouse Name: N/A  . Number of Children: N/A  . Years of Education: N/A   Occupational History  . Not on file.   Social History Main Topics  . Smoking status: Never Smoker   . Smokeless tobacco: Never Used  . Alcohol Use: No     Comment: history of ETOH abuse  . Drug Use: No  . Sexual Activity: No   Other Topics Concern  . Not on file   Social History Narrative    Past Surgical History  Procedure Laterality Date  . 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 Colonoscopy with propofol N/A 01/28/2014    Procedure: COLONOSCOPY  WITH PROPOFOL (at cecum at 0749 total withdrawal time=32minutes);  Surgeon: West Bali, MD;  Location: [facility];  Service: Endoscopy;  Laterality: N/A;  . Esophagogastroduodenoscopy (egd) with propofol N/A 01/28/2014    Procedure: ESOPHAGOGASTRODUODENOSCOPY (EGD) WITH PROPOFOL;  Surgeon: West Bali, MD;  Location: [facility];  Service: Endoscopy;  Laterality: N/A;  . Savory dilation N/A 01/28/2014    Procedure: SAVORY DILATION  (12.24mm to 16mm);  Surgeon: West Bali, MD;  Location: [facility];  Service: Endoscopy;  Laterality: N/A;  . Polypectomy  01/28/2014    Procedure: POLYPECTOMY (Rectal);  Surgeon: West Bali, MD;  Location: [facility];  Service: Endoscopy;;  . Esophageal biopsy  01/28/2014    Procedure: BIOPSY (Gastric);  Surgeon: West Bali, MD;  Location: [facility];  Service: Endoscopy;;    No Known Allergies   Hospital Meds:  . amiodarone  150 mg Intravenous Once  . metronidazole  500 mg Intravenous 4 times per day  . [START ON 02/15/2015] pantoprazole (PROTONIX) IV  40 mg Intravenous Q12H    Prior to Admission medications   Medication Sig Start Date End Date Taking? Authorizing Provider  aspirin 81 MG tablet Take 81 mg by mouth daily.    Historical Provider, MD  atenolol (TENORMIN) 25 MG tablet Take 25 mg by mouth daily.  01/08/14   Historical Provider, MD  benztropine (COGENTIN) 2 MG tablet Take 2 mg by mouth 2 (two) times daily.  01/08/14   Historical Provider, MD  calcium-vitamin D (OSCAL WITH D) 500-200 MG-UNIT per tablet Take 1 tablet by mouth daily.    Historical Provider, MD  carbamazepine (TEGRETOL) 200 MG tablet Take 400 mg by mouth 2 (two) times daily.  01/08/14   Historical Provider, MD  clotrimazole-betamethasone (LOTRISONE) cream Apply 1 application topically.  11/25/13   Historical Provider, MD  CRESTOR 20 MG tablet Take 20 mg by mouth daily.  01/08/14   Historical Provider, MD  diazepam (VALIUM) 10 MG tablet Take 10 mg by mouth every 6 (six) hours as needed.  01/08/14    Historical Provider, MD  fenofibrate (TRICOR) 145 MG tablet Take 145 mg by mouth daily.  01/08/14   Historical Provider, MD  glimepiride (AMARYL) 4 MG tablet Take 4 mg by mouth 2 (two) times daily.  01/08/14   Historical Provider, MD  glyBURIDE (DIABETA) 5 MG tablet Take 2.5 mg by mouth 2 (two) times daily with a meal.  01/08/14   Historical Provider, MD  haloperidol (HALDOL) 10 MG tablet Take 40 mg by mouth 2 (two) times daily.  01/08/14   Historical Provider, MD  LORazepam (ATIVAN) 1 MG tablet Take 1 mg by mouth 2 (two) times daily as needed for anxiety.  01/02/14   Historical Provider, MD  metFORMIN (GLUCOPHAGE) 1000 MG tablet 1,000 mg 2 (two) times daily with a meal.  12/12/13   Historical Provider, MD  Multiple Vitamins-Minerals (MULTIVITAMINS THER. W/MINERALS) TABS tablet Take 1 tablet by mouth daily.    Historical Provider, MD  nortriptyline (PAMELOR) 50 MG capsule Take 100 mg by mouth at bedtime.  01/08/14   Historical Provider, MD  Omega-3 Fatty Acids (OMEGA-3 FISH OIL PO) Take 1,000 mg by mouth 3 (three) times daily.    Historical Provider, MD  omeprazole (PRILOSEC) 20 MG capsule 1 po bid 30 minutes before meals for 3 mos then once daily FOREVER 01/28/14   West BaliSandi L Fields, MD  QUEtiapine (SEROQUEL) 200 MG tablet Take 800 mg by mouth at bedtime. Four tablets at bedtime 01/08/14   Historical Provider, MD  ramipril (ALTACE) 10 MG capsule Take 10 mg by mouth daily.  01/08/14   Historical Provider, MD  traZODone (DESYREL) 100 MG tablet Take 100 mg by mouth at bedtime as needed for sleep.     Historical Provider, MD    Review of systems complete and found to be negative unless listed above    Physical Exam: Blood pressure 137/68, pulse 152, temperature 99.4 F (37.4 C), temperature source Axillary, resp. rate 17, height 5\' 1"  (1.549 m), weight 167 lb 5.3 oz (75.9 kg), last menstrual period 01/21/2014, SpO2 100 %.    General: Minimally responsive HEENT: OP clear, mucus membranes moist  SKIN: warm,  dry. No rashes.  Musculoskeletal: Muscle strength 5/5 all ext  Psychiatric: Mood and affect normal  Neck: No JVD, no carotid bruits, no thyromegaly, no lymphadenopathy.  Lungs:Clear bilaterally, no wheezes, rhonci, crackles  Cardiovascular: Regular, tachy. No murmurs, gallops or rubs.  Abdomen:Soft. Distended. TTP.   Extremities: No lower extremity edema. Pulses are 2 + in the bilateral DP/PT.  Labs:   Lab Results  Component Value Date   WBC 3.9* 02/11/2015   HGB 10.6* 02/11/2015   HCT 31.0* 02/11/2015   MCV 91.7 02/11/2015   PLT 172 02/11/2015      EKG: Atrial flutter, rate 135  bpm  ASSESSMENT AND PLAN:   1. Atrial flutter with RVR: Rate is slightly better controlled. BP is stable.  She is currently on an amiodarone drip and a diltiazem drip. She has been given an additional bolus of amiodarone. She is being resuscitated with IV fluids.  Echo tomorrow to assess LV function. No anti-coagulation currently with GI bleeding.   We will follow with you.    Signed: Verne Carrow, MD 02/11/2015, 4:45 PM

## 2015-02-11 NOTE — Consult Note (Signed)
Consultation  Referring Provider: [facility] hospitalist Primary Care Physician:  PROVIDER NOT IN SYSTEM Primary Gastroenterologist:  Dr. Jonette Eva  Reason for Consultation:   Abdominal distention, dark stool abnormal CT  HPI: Natasha Martin is a 54 y.o. female admitted to the ICU at [facility] this afternoon in transfer from [facility]. She had been admitted to [facility] on 02/09/2015 with altered mental status from her group home. According to her family she had been in the emergency room there the day before after an episode of unconsciousness. It was felt she may have had a seizure but apparently this was not witnessed. She does have history of seizure disorder but had not had any seizures in years. She has not been mentating since then. CT of the head without contrast was negative she was noted to have a distended abdomen and CT of the abdomen and pelvis again with no IV contrast showed a colitis from the transverse colon to the descending colon. The only yesterday she went into rapid atrial fibrillation was started on amiodarone. She then developed dark stools. And for all of the above reasons decision was made to transfer her here for further intensive evaluation. Her family states she has history of diabetes mellitus seizure disorder and schizophrenia anxiety and hypertension. She had undergone prior colonoscopy and EGD in March of last year 2015 with Dr. Andrey Campanile fields or complaints of rectal bleeding colonoscopy was positive for a redundant colon one small polyp and hemorrhoids she also had EGD with dilation was noted have reflux esophagitis and erosive gastritis. Labs have been reviewed hemoglobin 10.6 today She has been hemodynamically stable here however remains in rapid atrial fibrillation with a rate in the 130s to 150s. Cardiology is evaluating   Past Medical History  Diagnosis Date  . Diabetes   . Hyperlipidemia   . Schizophrenia   . Anxiety   . Hypertension   . Seizures       has not had a seizure since last year. Unknown orgin- no family available to come and patient does not know.  Marland Kitchen Dysphagia   . Mild mental slowing   . Poor historian     Past Surgical History  Procedure Laterality Date  . 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 Colonoscopy with propofol N/A 01/28/2014    Procedure: COLONOSCOPY WITH PROPOFOL (at cecum at 0749 total withdrawal time=31minutes);  Surgeon: West Bali, MD;  Location: [facility];  Service: Endoscopy;  Laterality: N/A;  . Esophagogastroduodenoscopy (egd) with propofol N/A 01/28/2014    Procedure: ESOPHAGOGASTRODUODENOSCOPY (EGD) WITH PROPOFOL;  Surgeon: West Bali, MD;  Location: [facility];  Service: Endoscopy;  Laterality: N/A;  . Savory dilation N/A 01/28/2014    Procedure: SAVORY DILATION  (12.19mm to 16mm);  Surgeon: West Bali, MD;  Location: [facility];  Service: Endoscopy;  Laterality: N/A;  . Polypectomy  01/28/2014    Procedure: POLYPECTOMY (Rectal);  Surgeon: West Bali, MD;  Location: [facility];  Service: Endoscopy;;  . Esophageal biopsy  01/28/2014    Procedure: BIOPSY (Gastric);  Surgeon: West Bali, MD;  Location: [facility];  Service: Endoscopy;;    Prior to Admission medications   Medication Sig Start Date End Date Taking? Authorizing Provider  aspirin 81 MG tablet Take 81 mg by mouth daily.    Historical Provider, MD  atenolol (TENORMIN) 25 MG tablet Take 25 mg by mouth daily.  01/08/14  Historical Provider, MD  benztropine (COGENTIN) 2 MG tablet Take 2 mg by mouth 2 (two) times daily.  01/08/14   Historical Provider, MD  calcium-vitamin D (OSCAL WITH D) 500-200 MG-UNIT per tablet Take 1 tablet by mouth daily.    Historical Provider, MD  carbamazepine (TEGRETOL) 200 MG tablet Take 400 mg by mouth 2 (two) times daily.  01/08/14   Historical Provider, MD  clotrimazole-betamethasone (LOTRISONE) cream Apply 1 application topically.  11/25/13   Historical Provider, MD  CRESTOR 20  MG tablet Take 20 mg by mouth daily.  01/08/14   Historical Provider, MD  diazepam (VALIUM) 10 MG tablet Take 10 mg by mouth every 6 (six) hours as needed.  01/08/14   Historical Provider, MD  fenofibrate (TRICOR) 145 MG tablet Take 145 mg by mouth daily.  01/08/14   Historical Provider, MD  glimepiride (AMARYL) 4 MG tablet Take 4 mg by mouth 2 (two) times daily.  01/08/14   Historical Provider, MD  glyBURIDE (DIABETA) 5 MG tablet Take 2.5 mg by mouth 2 (two) times daily with a meal.  01/08/14   Historical Provider, MD  haloperidol (HALDOL) 10 MG tablet Take 40 mg by mouth 2 (two) times daily.  01/08/14   Historical Provider, MD  LORazepam (ATIVAN) 1 MG tablet Take 1 mg by mouth 2 (two) times daily as needed for anxiety.  01/02/14   Historical Provider, MD  metFORMIN (GLUCOPHAGE) 1000 MG tablet 1,000 mg 2 (two) times daily with a meal.  12/12/13   Historical Provider, MD  Multiple Vitamins-Minerals (MULTIVITAMINS THER. W/MINERALS) TABS tablet Take 1 tablet by mouth daily.    Historical Provider, MD  nortriptyline (PAMELOR) 50 MG capsule Take 100 mg by mouth at bedtime.  01/08/14   Historical Provider, MD  Omega-3 Fatty Acids (OMEGA-3 FISH OIL PO) Take 1,000 mg by mouth 3 (three) times daily.    Historical Provider, MD  omeprazole (PRILOSEC) 20 MG capsule 1 po bid 30 minutes before meals for 3 mos then once daily FOREVER 01/28/14   West BaliSandi L Fields, MD  QUEtiapine (SEROQUEL) 200 MG tablet Take 800 mg by mouth at bedtime. Four tablets at bedtime 01/08/14   Historical Provider, MD  ramipril (ALTACE) 10 MG capsule Take 10 mg by mouth daily.  01/08/14   Historical Provider, MD  traZODone (DESYREL) 100 MG tablet Take 100 mg by mouth at bedtime as needed for sleep.     Historical Provider, MD    Current Facility-Administered Medications  Medication Dose Route Frequency Provider Last Rate Last Dose  . 0.9 %  sodium chloride infusion   Intravenous Continuous Dawood S Elgergawy, MD      . albuterol (PROVENTIL) (2.5  MG/3ML) 0.083% nebulizer solution 2.5 mg  2.5 mg Nebulization Q2H PRN Starleen Armsawood S Elgergawy, MD      . amiodarone (NEXTERONE PREMIX) 360 MG/200ML (1.8 mg/mL) IV infusion  60 mg/hr Intravenous Continuous Leana Roeawood S Elgergawy, MD 33.3 mL/hr at 02/11/15 1529 60 mg/hr at 02/11/15 1529  . amiodarone (NEXTERONE PREMIX) 360 MG/200ML (1.8 mg/mL) IV infusion  30 mg/hr Intravenous Continuous Leana Roeawood S Elgergawy, MD      . amiodarone (NEXTERONE) 1.8 mg/mL load via infusion 150 mg  150 mg Intravenous Once Simonne MartinetPeter E Babcock, NP      . diltiazem (CARDIZEM) 100 mg in dextrose 5 % 100 mL (1 mg/mL) infusion  5-15 mg/hr Intravenous Titrated Starleen Armsawood S Elgergawy, MD 10 mL/hr at 02/11/15 1529 10 mg/hr at 02/11/15 1529  . ondansetron (ZOFRAN) tablet 4 mg  4 mg Oral Q6H PRN Starleen Arms, MD       Or  . ondansetron (ZOFRAN) injection 4 mg  4 mg Intravenous Q6H PRN Leana Roe Elgergawy, MD      . pantoprazole (PROTONIX) 80 mg in sodium chloride 0.9 % 100 mL IVPB  80 mg Intravenous Once Starleen Arms, MD      . pantoprazole (PROTONIX) 80 mg in sodium chloride 0.9 % 250 mL (0.32 mg/mL) infusion  8 mg/hr Intravenous Continuous Starleen Arms, MD      . Melene Muller ON 02/15/2015] pantoprazole (PROTONIX) injection 40 mg  40 mg Intravenous Q12H Starleen Arms, MD        Allergies as of 02/11/2015  . (No Known Allergies)    Family History  Problem Relation Age of Onset  . Colon cancer      unknown    History   Social History  . Marital Status: Legally Separated    Spouse Name: N/A  . Number of Children: N/A  . Years of Education: N/A   Occupational History  . Not on file.   Social History Main Topics  . Smoking status: Never Smoker   . Smokeless tobacco: Never Used  . Alcohol Use: No     Comment: history of ETOH abuse  . Drug Use: No  . Sexual Activity: No   Other Topics Concern  . Not on file   Social History Narrative    Review of Systems: Pt unable to offer.  Physical Exam: Vital signs in  last 24 hours: Temp:  [98.3 F (36.8 C)-99.4 F (37.4 C)] 99.4 F (37.4 C) (04/13 1530) Pulse Rate:  [146-152] 152 (04/13 1530) Resp:  [17-19] 17 (04/13 1530) BP: (137-156)/(68-79) 137/68 mmHg (04/13 1530) SpO2:  [100 %] 100 % (04/13 1530) Weight:  [167 lb 5.3 oz (75.9 kg)] 167 lb 5.3 oz (75.9 kg) (04/13 1400)   General:  Lethargic-opens eyes to sternal rub but no attempt to speak,  Well-developed, well-nourished, obese, NG draining dark green black effluent Head:  Normocephalic and atraumatic. Eyes:  Sclera clear, no icterus.   Conjunctiva pink. Ears:  Normal auditory acuity. Nose:  No deformity, discharge,  or lesions. Mouth:  No deformity or lesions.   Neck:  Supple; no masses or thyromegaly. Lungs:  Clear throughout to auscultation.   No wheezes, crackles, or rhonchi. Heart: tachy irrRegular rate and rhythm; no murmurs, clicks, rubs,  or gallops. Abdomen:  Distended, tympanitic, Bs+ but decreased, cannot elicit ant focal tenderness Rectal:  Deferred  Msk:  Symmetrical without gross deformities. . Pulses:  Normal pulses noted. Extremities:  Without clubbing or edema. Neurologic:  Obtunded.opens eyes to rub Skin:  Intact without significant lesions or rashes.. Psych:    Intake/Output from previous day:   Intake/Output this shift:    Lab Results:  Recent Labs  02/11/15 1525  WBC 3.9*  HGB 10.6*  HCT 31.0*  PLT 172   BMET  Recent Labs  02/11/15 1525  NA 137  K 3.6  CL 102  CO2 26  GLUCOSE 194*  BUN 15  CREATININE 0.66  CALCIUM 7.9*   LFT  Recent Labs  02/11/15 1525  PROT 6.0  ALBUMIN 2.6*  AST 21  ALT 17  ALKPHOS 55  BILITOT 0.8  BILIDIR 0.2  IBILI 0.6   PT/INR No results for input(s): LABPROT, INR in the last 72 hours. Hepatitis Panel No results for input(s): HEPBSAG, HCVAB, HEPAIGM, HEPBIGM in the last 72 hours.  MPRESSION:  #42 54 year old white female who resides in a group home with history of mental retardation and schizophrenia with  altered mental status 4 days, new onset rapid atrial fibrillation, low-grade GI bleeding and abnormal CT of the abdomen and pelvis (noncontrasted) showing a left-sided colitis. Etiology of her constellation of problems is unclear though would wonder if she had an arrhythmia which precipitated the altered mental status and ?ischemic  colitis-question embolic shower #2 anemia mild secondary to above #3 acute renal insufficiency improved #4 history of erosive gastritis March 2015 #5 history of colon polyps March 2015  Plan; Patient is scheduled for CT angio of the chest and CT of the abdomen and pelvis with contrast. Will await findings of above for now GI management is supportive. Leave NG to suction Serial hemoglobins IV PPI twice a day Surgery is also following. We will follow with you   Amy Esterwood  02/11/2015, 4:18 PM      Attending physician's note   I have taken a history, examined the patient and reviewed the chart. I agree with the Advanced Practitioner's note, impression and recommendations. Suspect ischemic colitis with segmental bowel wall thickening on CT. Had colonoscopy with only a small rectal polyp found and EGD with erosive gastritis, esophageal stricture and reflux esophagitis-both 1 year ago. Await contrasted abdomen/pelvic CT and chest CT angio. Monitor CBC.   Meryl Dare, MD Clementeen Graham

## 2015-02-11 NOTE — Consult Note (Signed)
Reason for Consult: abdominal distension and Altered mental status Referring Physician: Starleen Arms, MD  Natasha Martin is an 54 y.o. 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 HPI: Pt seen at [facility] on Sunday 02/08/15 after she was found unresponsive, she was thought to have a seizure, but nothing found at ED in [place] and she returned to the group home that evening.  She walked in but by Monday she could not get up and her speech was garbled, they could not understand her.  This was new.  She was seen and  admitted to [facility] with altered mental status, elevated creatinine and had a distended abdomen. There are reports of Melena and tarry stools. She had an elevated D-Dimer.   CT without contrast showed wall  thickening from the mid transverse to descending colon.  She has subsequently developed AF with RVR and is on amiodarone and Cardizem drips.  She continues to have a deterioration of her mental status, currently she is unresponsive to any commands and is not responding to family in room.  She was transferred to Medicine here at [facility] for evaluation and further management.  We are ask to see and help evaluate for ischemic colitis.  CT's are ordered and pending at this time. She is afebrile, AF in 140-150's range.  Sats good on 4l/Mackey. Labs show creatinine is down to 0.66.  WBC is normal Glucose is up at 194.  Lactate is 1.7, procalcitonin 5.07.  We are ask to see.  She is currently unresponsive, NG, greenish colored drainage.   Past Medical History  Diagnosis Date  . Diabetes   . Hyperlipidemia   . Schizophrenia   . Anxiety   . Hypertension   . Seizures     has not had a seizure since last year. Unknown orgin- no family available to come and patient does not know.  Marland Kitchen Dysphagia   . Mild mental slowing   . Poor historian     Past Surgical History  Procedure Laterality Date  . Leg surgery Bilateral     states she was born crippled and had  to be repaired  . Colonoscopy with propofol N/A 01/28/2014    Procedure: COLONOSCOPY WITH PROPOFOL (at cecum at 0749 total withdrawal time=12minutes);  Surgeon: West Bali, MD;  Location: [facility];  Service: Endoscopy;  Laterality: N/A;  . Esophagogastroduodenoscopy (egd) with propofol N/A 01/28/2014    Procedure: ESOPHAGOGASTRODUODENOSCOPY (EGD) WITH PROPOFOL;  Surgeon: West Bali, MD;  Location: [facility];  Service: Endoscopy;  Laterality: N/A;  . Savory dilation N/A 01/28/2014    Procedure: SAVORY DILATION  (12.54mm to 16mm);  Surgeon: West Bali, MD;  Location: [facility];  Service: Endoscopy;  Laterality: N/A;  . Polypectomy  01/28/2014    Procedure: POLYPECTOMY (Rectal);  Surgeon: West Bali, MD;  Location: [facility];  Service: Endoscopy;;  . Esophageal biopsy  01/28/2014    Procedure: BIOPSY (Gastric);  Surgeon: West Bali, MD;  Location: [facility];  Service: Endoscopy;;    Family History  Problem Relation Age of Onset  . Colon cancer      unknown    Social History:  reports that she has never smoked. She does not have any smokeless tobacco history on file. She reports that she does not drink alcohol or use illicit drugs.  Allergies: No Known Allergies  Medications:  Prior to Admission:  Prescriptions prior to admission  Medication Sig Dispense Refill  Last Dose  . aspirin 81 MG tablet Take 81 mg by mouth daily.   01/27/2014 at 0800  . atenolol (TENORMIN) 25 MG tablet Take 25 mg by mouth daily.    01/27/2014 at 0800  . benztropine (COGENTIN) 2 MG tablet Take 2 mg by mouth 2 (two) times daily.    01/27/2014 at 2000  . calcium-vitamin D (OSCAL WITH D) 500-200 MG-UNIT per tablet Take 1 tablet by mouth daily.   01/27/2014 at Unknown time  . carbamazepine (TEGRETOL) 200 MG tablet Take 400 mg by mouth 2 (two) times daily.    01/27/2014 at 2000  . clotrimazole-betamethasone (LOTRISONE) cream Apply 1 application topically.    01/27/2014 at Unknown time  . CRESTOR 20 MG tablet Take 20 mg by  mouth daily.    01/27/2014 at Unknown time  . diazepam (VALIUM) 10 MG tablet Take 10 mg by mouth every 6 (six) hours as needed.    01/27/2014 at 2000  . fenofibrate (TRICOR) 145 MG tablet Take 145 mg by mouth daily.    01/27/2014 at Unknown time  . glimepiride (AMARYL) 4 MG tablet Take 4 mg by mouth 2 (two) times daily.    01/27/2014 at Unknown time  . glyBURIDE (DIABETA) 5 MG tablet Take 2.5 mg by mouth 2 (two) times daily with a meal.    01/27/2014 at Unknown time  . haloperidol (HALDOL) 10 MG tablet Take 40 mg by mouth 2 (two) times daily.    01/27/2014 at 2000  . LORazepam (ATIVAN) 1 MG tablet Take 1 mg by mouth 2 (two) times daily as needed for anxiety.    01/27/2014 at 2000  . metFORMIN (GLUCOPHAGE) 1000 MG tablet 1,000 mg 2 (two) times daily with a meal.    01/27/2014 at Unknown time  . Multiple Vitamins-Minerals (MULTIVITAMINS THER. W/MINERALS) TABS tablet Take 1 tablet by mouth daily.   01/27/2014 at Unknown time  . nortriptyline (PAMELOR) 50 MG capsule Take 100 mg by mouth at bedtime.    01/27/2014 at Unknown time  . Omega-3 Fatty Acids (OMEGA-3 FISH OIL PO) Take 1,000 mg by mouth 3 (three) times daily.   01/27/2014 at Unknown time  . omeprazole (PRILOSEC) 20 MG capsule 1 po bid 30 minutes before meals for 3 mos then once daily FOREVER 60 capsule 11   . QUEtiapine (SEROQUEL) 200 MG tablet Take 800 mg by mouth at bedtime. Four tablets at bedtime   01/27/2014 at Unknown time  . ramipril (ALTACE) 10 MG capsule Take 10 mg by mouth daily.    01/27/2014 at Unknown time  . traZODone (DESYREL) 100 MG tablet Take 100 mg by mouth at bedtime as needed for sleep.    01/27/2014 at Unknown time   Scheduled: . amiodarone  150 mg Intravenous Once  . pantoprazole (PROTONIX) IV  80 mg Intravenous Once  . [START ON 02/15/2015] pantoprazole (PROTONIX) IV  40 mg Intravenous Q12H   Continuous: . sodium chloride    . amiodarone 60 mg/hr (02/11/15 1529)  . amiodarone    . diltiazem (CARDIZEM) infusion 10 mg/hr (02/11/15  1529)  . pantoprozole (PROTONIX) infusion     ZOX:[id number], ondansetron **OR** ondansetron (ZOFRAN) IV Anti-infectives    None      Results for orders placed or performed during the hospital encounter of 02/11/15 (from the past 48 hour(s))  Blood gas, arterial     Status: Abnormal   Collection Time: 02/11/15  3:15 PM  Result Value Ref Range   O2 Content 4.0 L/min  Delivery systems NASAL CANNULA    pH, Arterial 7.458 (H) 7.350 - 7.450   pCO2 arterial 35.4 35.0 - 45.0 mmHg   pO2, Arterial 150.0 (H) 80.0 - 100.0 mmHg   Bicarbonate 24.8 (H) 20.0 - 24.0 mEq/L   TCO2 22.7 0 - 100 mmol/L   Acid-Base Excess 1.5 0.0 - 2.0 mmol/L   O2 Saturation 97.2 %   Patient temperature 98.6    Collection site LEFT RADIAL    Drawn by [id number]    Sample type ARTERIAL DRAW    Allens test (pass/fail) PASS PASS  CBC     Status: Abnormal   Collection Time: 02/11/15  3:25 PM  Result Value Ref Range   WBC 3.9 (L) 4.0 - 10.5 K/uL   RBC 3.38 (L) 3.87 - 5.11 MIL/uL   Hemoglobin 10.6 (L) 12.0 - 15.0 g/dL   HCT 04.531.0 (L) 40.936.0 - 81.146.0 %   MCV 91.7 78.0 - 100.0 fL   MCH 31.4 26.0 - 34.0 pg   MCHC 34.2 30.0 - 36.0 g/dL   RDW 91.412.8 78.211.5 - 95.615.5 %   Platelets 172 150 - 400 K/uL    No results found.  Review of Systems  Unable to perform ROS: mental acuity  Constitutional: Negative.   HENT: Negative.   Skin: Negative.    Blood pressure 137/68, pulse 152, temperature 99.4 F (37.4 C), temperature source Axillary, resp. rate 17, height 5\' 1"  (1.549 m), weight 75.9 kg (167 lb 5.3 oz), last menstrual period 01/21/2014, SpO2 100 %. Physical Exam  Constitutional: She appears well-developed and well-nourished.  She is not responding in ICU.  Family answered all questions.  HENT:  Head: Normocephalic and atraumatic.  NG in place  Eyes: Right eye exhibits no discharge. Left eye exhibits no discharge. No scleral icterus.  Neck: Normal range of motion. No JVD present. No tracheal deviation present.   Cardiovascular: Normal heart sounds.   No murmur heard. AF with RVR  Respiratory: Effort normal and breath sounds normal. No stridor. No respiratory distress. She has no wheezes. She has no rales.  Anterior exam  GI: She exhibits distension. She exhibits no mass. There is no tenderness. There is no rebound and no guarding.  I did not hear bowel sounds, she is in isolation so we don't have a good scope. Tympanic on percussion   Musculoskeletal: She exhibits no edema.  Lymphadenopathy:    She has no cervical adenopathy.  Neurological:  Pt is unresponsive  Skin: Skin is dry. No rash noted. No erythema. No pallor.  Psychiatric:  unresponsive    Assessment/Plan: 1.  Altered Mental status/LOC of uncertain etiology 2.  Abdominal distension, she was having some pain with thickened transverse and descending colon wall on non contrast CT. 3.  Reported Melena 4.  AF with RVR  - THIS  Is thought to be a new diagnosis 5.  Group home secondary to diminished mental capacity 20+ years. (blue baby) 6.  Hx of seizures 7.  Hx of schizophrenia 8.  Diabetes  9.  Hypertension 10.  Dysphagia with prior esophageal dilatation    Plan:  We will await CT scans and follow with you.  She does not appear to have an acute abdomen at this time, by exam.    Annaleia Pence 02/11/2015, 3:51 PM

## 2015-02-12 ENCOUNTER — Inpatient Hospital Stay (HOSPITAL_COMMUNITY): Payer: Medicare Other

## 2015-02-12 ENCOUNTER — Inpatient Hospital Stay (HOSPITAL_COMMUNITY)
Admission: AD | Admit: 2015-02-12 | Discharge: 2015-02-12 | Disposition: A | Discharge status: Home / Self Care | Payer: Medicare Other | Source: Other Acute Inpatient Hospital | Attending: Internal Medicine | Admitting: Internal Medicine

## 2015-02-12 DIAGNOSIS — K567 Ileus, unspecified: Secondary | ICD-10-CM | POA: Diagnosis present

## 2015-02-12 DIAGNOSIS — K550 Acute vascular disorders of intestine: Secondary | ICD-10-CM

## 2015-02-12 DIAGNOSIS — R079 Chest pain, unspecified: Secondary | ICD-10-CM

## 2015-02-12 DIAGNOSIS — K55039 Acute (reversible) ischemia of large intestine, extent unspecified: Secondary | ICD-10-CM | POA: Diagnosis present

## 2015-02-12 LAB — BASIC METABOLIC PANEL
Anion gap: 8 (ref 5–15)
BUN: 11 mg/dL (ref 6–23)
CALCIUM: 7.7 mg/dL — AB (ref 8.4–10.5)
CHLORIDE: 106 mmol/L (ref 96–112)
CO2: 26 mmol/L (ref 19–32)
Creatinine, Ser: 0.69 mg/dL (ref 0.50–1.10)
GFR calc Af Amer: >90 mL/min (ref >90)
Glucose, Bld: 199 mg/dL — ABNORMAL HIGH (ref 70–99)
Potassium: 2.9 mmol/L — ABNORMAL LOW (ref 3.5–5.1)
SODIUM: 140 mmol/L (ref 135–145)

## 2015-02-12 LAB — TROPONIN I
Troponin I: 0.04 ng/mL — ABNORMAL HIGH (ref <0.031)
Troponin I: <0.03 ng/mL (ref <0.031)

## 2015-02-12 LAB — GLUCOSE, CAPILLARY: Glucose-Capillary: 183 mg/dL — ABNORMAL HIGH (ref 70–99)

## 2015-02-12 LAB — CBC
HCT: 29.4 % — ABNORMAL LOW (ref 36.0–46.0)
HEMOGLOBIN: 10.2 g/dL — AB (ref 12.0–15.0)
MCH: 31.5 pg (ref 26.0–34.0)
MCHC: 34.7 g/dL (ref 30.0–36.0)
MCV: 90.7 fL (ref 78.0–100.0)
PLATELETS: 176 10*3/uL (ref 150–400)
RBC: 3.24 MIL/uL — ABNORMAL LOW (ref 3.87–5.11)
RDW: 12.8 % (ref 11.5–15.5)
WBC: 4 10*3/uL (ref 4.0–10.5)

## 2015-02-12 LAB — OCCULT BLOOD GASTRIC / DUODENUM (SPECIMEN CUP)
Occult Blood, Gastric: POSITIVE — AB
PH, GASTRIC: 4

## 2015-02-12 LAB — PROCALCITONIN: Procalcitonin: 3.43 ng/mL

## 2015-02-12 MED ORDER — POTASSIUM CHLORIDE 10 MEQ/50ML IV SOLN
10.0000 meq | INTRAVENOUS | Status: AC
  Administered 2015-02-12 (×6): 10 meq via INTRAVENOUS
  Filled 2015-02-12 (×6): qty 50

## 2015-02-12 MED ORDER — AMIODARONE IV BOLUS ONLY 150 MG/100ML
150.0000 mg | Freq: Once | INTRAVENOUS | Status: AC
  Administered 2015-02-12: 150 mg via INTRAVENOUS
  Filled 2015-02-12: qty 100

## 2015-02-12 MED ORDER — METOPROLOL TARTRATE 1 MG/ML IV SOLN: 2.5000 mg | Freq: Four times a day (QID) | INTRAVENOUS | Status: DC

## 2015-02-12 MED ORDER — IOHEXOL 350 MG/ML SOLN
100.0000 mL | Freq: Once | INTRAVENOUS | Status: AC | PRN
  Administered 2015-02-12: 100 mL via INTRAVENOUS

## 2015-02-12 MED ORDER — MAGNESIUM SULFATE 2 GM/50ML IV SOLN
2.0000 g | Freq: Once | INTRAVENOUS | Status: AC
  Administered 2015-02-12: 2 g via INTRAVENOUS
  Filled 2015-02-12: qty 50

## 2015-02-12 MED ORDER — FAMOTIDINE 20 MG PO TABS
20.0000 mg | ORAL_TABLET | Freq: Every day | ORAL | Status: DC
  Administered 2015-02-12 – 2015-02-20 (×8): 20 mg via ORAL
  Filled 2015-02-12 (×9): qty 1

## 2015-02-12 MED ORDER — PERFLUTREN LIPID MICROSPHERE
1.0000 mL | INTRAVENOUS | Status: AC | PRN
  Filled 2015-02-12: qty 10

## 2015-02-12 MED ORDER — ALPRAZOLAM 0.25 MG PO TABS
0.2500 mg | ORAL_TABLET | Freq: Three times a day (TID) | ORAL | Status: DC | PRN
Start: 2015-02-12 — End: 2015-02-20
  Administered 2015-02-12 – 2015-02-20 (×11): 0.25 mg via ORAL
  Filled 2015-02-12 (×13): qty 1

## 2015-02-12 MED ORDER — METOPROLOL TARTRATE 1 MG/ML IV SOLN
5.0000 mg | Freq: Four times a day (QID) | INTRAVENOUS | Status: DC
  Administered 2015-02-12 – 2015-02-13 (×3): 5 mg via INTRAVENOUS
  Filled 2015-02-12 (×3): qty 5

## 2015-02-12 MED ORDER — QUETIAPINE FUMARATE 100 MG PO TABS
800.0000 mg | ORAL_TABLET | Freq: Every day | ORAL | Status: DC
  Administered 2015-02-12 – 2015-02-16 (×5): 800 mg via ORAL
  Filled 2015-02-12 (×5): qty 8

## 2015-02-12 MED ORDER — CARBAMAZEPINE 200 MG PO TABS
400.0000 mg | ORAL_TABLET | Freq: Two times a day (BID) | ORAL | Status: DC
  Administered 2015-02-12 – 2015-02-20 (×16): 400 mg via ORAL
  Filled 2015-02-12 (×23): qty 2

## 2015-02-12 NOTE — Progress Notes (Signed)
Inpatient Diabetes Program Recommendations  AACE/ADA: New Consensus Statement on Inpatient Glycemic Control (2013)  Target Ranges:  Prepandial:   less than 140 mg/dL      Peak postprandial:   less than 180 mg/dL (1-2 hours)      Critically ill patients:  140 - 180 mg/dL     Results for Retta MacROWE, Juan S (MRN [id number]) as of 02/12/2015 15:52  Ref. Range 02/11/2015 15:13 02/12/2015 06:33  Glucose-Capillary Latest Ref Range: 70-99 mg/dL 409161 (H) 811183 (H)    Admit with: AMS/ Colitis/ A Fib w/ RVR  History: DM, HTN, Schizophrenia, Mental disability  Home DM Meds: Lantus 20 units QHS       Novolog 8 units QID for CBG > 150 mg/dl       Amaryl 4 mg bid       Glyburide 2.5 mg bid  Current DM Orders: None    **Patient has an order for CBG checks Q6 hours, however, only one CBG checked today.    MD- Please place order for Novolog Sensitive SSI tid ac + HS    Will follow Ambrose FinlandJeannine Johnston Vandy Fong RN, MSN, CDE Diabetes Coordinator Inpatient Diabetes Program Team Pager: [phone] (8a-5p)

## 2015-02-12 NOTE — Progress Notes (Signed)
Clinical Social Work Department BRIEF PSYCHOSOCIAL ASSESSMENT 02/12/2015  Patient:  Natasha Martin,Natasha Martin     Account Number:  [id number]     Admit date:  02/11/2015  Clinical Social Worker:  Hampton AbbotGRANT,Daveena Elmore, CLINICAL SOCIAL WORKER  Date/Time:  02/12/2015 01:52 PM  Referred by:  Physician  Date Referred:  02/12/2015 Referred for  Other - See comment   Other Referral:   Admitted from group home.   Interview type:  Family Other interview type:   Sister, Natasha Martin via phone    PSYCHOSOCIAL DATA Living Status:  FACILITY Admitted from facility:  Other- [facility]  Level of care:  Group Home Primary support name:  Natasha Martin Primary support relationship to patient:  SIBLING Degree of support available:   Adequate    CURRENT CONCERNS Current Concerns  Post-Acute Placement   Other Concerns:    SOCIAL WORK ASSESSMENT / PLAN CSW received referral being that pt is from a group home. CSW to update FL2 and contact group home for DC plans.   Assessment/plan status:  [facility] to [facility] Other assessment/ plan:   Information/referral to community resources:   CSW spoke with family regarding DC plans as well as group home to see what pt baseline is and if pt can return or needs short term rehab.    PATIENT'Martin/FAMILY'Martin RESPONSE TO PLAN OF CARE: [facility] being that [facility] from facility. CSW introduced self and explained role. CSW inquired about pt'Martin baseline, but the RN was unaware and gave her manager Latoya'Martin contact for further discussion [phone]. CSW contacted Latoya to gather information but was unable to reach her, CSW left detailed message, awaiting phone call back. CSW also contacted pt'Martin sister Natasha Martin regarding DC plans. Sister states that she would like to see if they can find another group home and the family is currently working on that now and states [facility] to discuss possible  placements for other group homes or SNF if recommended by PT. CSW will continue to follow for further DC plans.       Hampton AbbotKadijah Yesenia Locurto BSW Intern

## 2015-02-12 NOTE — Progress Notes (Signed)
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 Initially admitted to [facility] on 4/10 for evaluation of encephalopathy. Dx eval demonstrated AKI, Na 125, abd pain and hyperkalemia. There was also question of UTI so treated for that. On 4/12 developed new onset of AF w/ RVR as well as worsening Abd swelling. CT abd w/ possible colitis. Transferred to [facility] on 4/13 as MS not improved and remained in AF w/ RVR.   STUDIES:  CT head 4/9: neg (per report) CT abd/pelvis 4/13:  1. Colitis extending from the mid transverse colon through the rectum. Findings may be infectious or inflammatory. There is no perforation or abscess. 2. Dilated and fluid-filled small bowel loops in the central andlower abdomen. This may be reactive ileus versus small bowel enteritis.3. Small amount of free fluid in the pelvis and perisplenic space. CT head 4/13: normal  SUBJECTIVE:  Now better   VITAL SIGNS: Temp:  [98.3 F (36.8 C)-99.5 F (37.5 C)] 99.5 F (37.5 C) (04/14 0700) Pulse Rate:  [83-152] 143 (04/14 0800) Resp:  [17-31] 24 (04/14 0800) BP: (135-178)/(57-121) 178/89 mmHg (04/14 0800) SpO2:  [92 %-100 %] 94 % (04/14 0800) Weight:  [75.9 kg (167 lb 5.3 oz)] 75.9 kg (167 lb 5.3 oz) (04/14 0400) HEMODYNAMICS: CVP:  [4 mmHg-11 mmHg] 6 mmHg VENTILATOR SETTINGS:   INTAKE / OUTPUT:  Intake/Output Summary (Last 24 hours) at 02/12/15 0912 Last data filed at 02/12/15 0800  Gross per 24 hour  Intake 2031.95 ml  Output   4250 ml  Net -2218.05 ml    PHYSICAL EXAMINATION: General:  54 year old female, now fully awake and at baseline Neuro:  Oriented x3 and at baseline HEENT:  NGT in nare. MMM no JVD  Cardiovascular:  Tachy irreg   Lungs:  Clear and decreased in bases Abdomen:  Distended, hypoactive. Some pain to palp  Musculoskeletal:  Generalized weakness  Skin:  intact  LABS:  CBC  Recent Labs Lab 02/11/15 1525 02/12/15 0438  WBC 3.9* 4.0  HGB 10.6* 10.2*  HCT 31.0* 29.4*  PLT 172 176   Coag's No results for input(s): APTT, INR in the last 168 hours. BMET  Recent Labs Lab 02/11/15 1525 02/12/15 0438  NA 137 140  K 3.6 2.9*  CL 102 106  CO2 26 26  BUN 15 11  CREATININE 0.66 0.69  GLUCOSE 194* 199*   Electrolytes  Recent Labs Lab 02/11/15 1525 02/12/15 0438  CALCIUM 7.9* 7.7*   Sepsis Markers  Recent Labs Lab 02/11/15 1525 02/11/15 1830 02/12/15 0438  LATICACIDVEN 1.7 0.9  --   PROCALCITON 5.07  --  3.43   ABG  Recent Labs Lab 02/11/15 1515  PHART 7.458*  PCO2ART 35.4  PO2ART 150.0*   Liver Enzymes  Recent Labs Lab 02/11/15 1525 02/11/15 1830  AST 21 19  ALT 17 17  ALKPHOS 55 53  BILITOT 0.8 1.0  ALBUMIN 2.6* 2.5*   Cardiac Enzymes  Recent Labs Lab 02/11/15 1830 02/11/15 2255 02/12/15 0438  TROPONINI 0.04* 0.04* <0.03   Glucose  Recent Labs Lab 02/11/15 1513 02/12/15 0633  GLUCAP 161* 183*    Imaging Ct Head Wo Contrast  02/11/2015   CLINICAL DATA:  Altered  mental status today.  EXAM: CT HEAD WITHOUT CONTRAST  TECHNIQUE: Contiguous axial images were obtained from the base of the skull through the vertex without intravenous contrast.  COMPARISON:  02/09/2015  FINDINGS: The ventricles are normal in size and configuration. No extra-axial fluid collections are identified. The gray-white differentiation is normal. No CT findings for acute intracranial process such as hemorrhage or infarction. No mass lesions. The brainstem and cerebellum are grossly normal.  The bony structures are intact. The paranasal sinuses and mastoid air cells are clear. The globes are intact.  IMPRESSION: Normal head CT.  No change since 2 days ago.   Electronically Signed   By:  Rudie MeyerP.  Gallerani M.D.   On: 02/11/2015 21:44   Ct Abdomen Pelvis W Contrast  02/11/2015   CLINICAL DATA:  Abdominal pain and distention, decreased bowel sounds.  EXAM: CT ABDOMEN AND PELVIS WITH CONTRAST  TECHNIQUE: Multidetector CT imaging of the abdomen and pelvis was performed using the standard protocol following bolus administration of intravenous contrast.  CONTRAST:  100mL OMNIPAQUE IOHEXOL 300 MG/ML  SOLN  COMPARISON:  Radiographs 1 day prior, CT abdomen 2 days prior.  FINDINGS: Bibasilar atelectasis, minimal pleural thickening dependently.  Enteric tube in place, tip in the duodenum. There is fluid distending the stomach. Diffusely fluid-filled small bowel with small bowel dilatation, measuring up to 4.9 cm. No definite transition point, the distal small bowel loops are decompressed. There is diffuse colonic wall thickening with surrounding perienteric inflammatory change from the mid transverse colon to the rectum. The distal colon is fluid-filled. The more proximal colon is normal in caliber with small volume of stool. The appendix is normal.  No free air or pneumatosis. Small amount of free fluid in the perisplenic space and pelvis is likely reactive. The mesenteric vessels appear patent.  No focal hepatic lesion. There is an elongated left hepatic lobe. The gallbladder is physiologically distended. The spleen and pancreas are unremarkable. Unchanged adrenal nodularity. Kidneys demonstrate symmetric enhancement and excretion. No hydronephrosis or perinephric stranding.  Abdominal aorta is normal in caliber, minimal atherosclerosis. No retroperitoneal adenopathy. Mild whole body wall edema.  Within the pelvis the urinary bladder is decompressed by Foley catheter. Uterus remains in situ, atrophic, normal for age. No adnexal mass.  There are no acute or suspicious osseous abnormalities. Remote lower left rib fractures.  IMPRESSION: 1. Colitis extending from the mid transverse colon through the rectum.  Findings may be infectious or inflammatory. There is no perforation or abscess. 2. Dilated and fluid-filled small bowel loops in the central and lower abdomen. This may be reactive ileus versus small bowel enteritis. 3. Small amount of free fluid in the pelvis and perisplenic space.   Electronically Signed   By: Rubye OaksMelanie  Ehinger M.D.   On: 02/11/2015 21:53   Dg Chest Port 1 View  02/11/2015   CLINICAL DATA:  Central catheter placement  EXAM: PORTABLE CHEST - 1 VIEW  COMPARISON:  February 10, 2015  FINDINGS: Central catheter tip is at the cavoatrial junction. Nasogastric tube tip and side port are in the distal stomach. No pneumothorax. There is no edema or consolidation. Heart is upper normal in size with pulmonary vascularity within normal limits. No adenopathy.  IMPRESSION: Tube and catheter positions as described without pneumothorax. No edema or consolidation.   Electronically Signed   By: Bretta BangWilliam  Woodruff III M.D.   On: 02/11/2015 17:33     ASSESSMENT / PLAN:  PULMONARY OETT A: No acute. At risk for acute resp failure in  setting of AMS P:   Pulse ox  CARDIOVASCULAR CVL A:  New on set AF w/ RVR CHADS2 score 2-3 P:  amio bolus, gtt and CCB per cards F/u echo She needs anticoagulation. Will need to decide on a) bleeding and b) safety of this w/ her baseline cognitive deficits. In this case heparin (to ensure no bleeding) f/b NOAC such as Pradaxa would be ideal as could give Praxbind if bleeding occurred. Don't think she's a good coumadin candidate   RENAL A:   AKI, Metabolic acidosis, and Hyponatremia (suspect r/t hypovolemia as responded to volume) Hypokalemia  P:   D/c sxn on NGT Replace K F/u cultures   GASTROINTESTINAL A:   abd pain Diffuse Colitis w/ heme + stools. Has h/o gastritis. Favor colitis etiology ischemic in nature from new onset AF P:   Clamp NGT Start cl liquid diet  Ck C diff PCR May need GI in-put further if we need anticoagulation   HEMATOLOGIC A:    Heme+ stools.  Anemia of critical illness  See GI section  P:  PAS for now; need to decide on anticoagulation  PPI F/u CBC  INFECTIOUS A:   Colitis ? R/o infectious vs ischemic  Reported UTI P:   BCx2 4/13>>> UC 4/13>>> Zosyn 4/13> Flagyl 4/13>>> Would narrow quickly  ENDOCRINE A:   DM  P:   ssi   NEUROLOGIC A:  Acute encephalopathy: ? Metabolic vs post-ictal -->resolved as of 4/14 H/o seizure d/o H/o chronic cognitive delay  Schizophrenia  P:   RASS goal: 0 Hold all sedating meds EEG pending. Suspect will be normal.   FAMILY  - Updates:   TODAY'S SUMMARY:  Admitted to [facility] for further evaluation of encephalopathy. This has now resolved. Likely was all metabolic in nature, but also consider post-ictal or poly-pharmacy. She remains in AF. CT again demonstrates diffuse colitis. Suspect this was ischemic in nature. She needs better rate control. Would cautiously resume home meds (in a slow and thoughtful manor). Need to decide on long-term anticoagulation. See discussion in cards section. Her cognitive delays and h/o seizure make anticoagulation a little more risky. PCCM will s/o. Defer anticoagulation to GI and cards.    Simonne Martinet ACNP-BC [facility] Pulmonary/Critical Care Pager # [phone] OR # [phone] if no answer   02/12/2015, 9:12 AM

## 2015-02-12 NOTE — Care Management Note (Signed)
  Page 1 of 1   02/12/2015     12:34:20 PM CARE MANAGEMENT NOTE 02/12/2015  Patient:  Natasha Martin,Natasha Martin   Account Number:  [id number]  Date Initiated:  02/12/2015  Documentation initiated by:  DAVIS,RHONDA  Subjective/Objective Assessment:   infectious colitis with sirs     Action/Plan:   home when stable/ hx of M.R. [facility]   Anticipated DC Date:  02/15/2015   Anticipated DC Plan:  ASSISTED LIVING / REST HOME  In-house referral  Clinical Social Worker      DC Planning Services  CM consult      [facility]  NA   Choice offered to / List presented to:             Status of service:  In process, will continue to follow Medicare Important Message given?   (If response is "NO", the following Medicare IM given date fields will be blank) Date Medicare IM given:   Medicare IM given by:   Date Additional Medicare IM given:   Additional Medicare IM given by:    Discharge Disposition:    Per UR Regulation:  Reviewed for med. necessity/level of care/duration of stay  If discussed at Long Length of Stay Meetings, dates discussed:    Comments:  February 12, 2015/Rhonda L. Earlene Plateravis, RN, BSN, CCM. Case Management [employer] [phone] No discharge needs present of time of review.

## 2015-02-12 NOTE — Progress Notes (Signed)
Patient ID: Natasha Martin, female   DOB: 01/05/1961, 54 y.o.   MRN: [id number]    Progress Note   Subjective  Alert, about back to baseline per family, hungry, denies abdominal pain. Cdiff pending- covered with Flagyl   Objective   Vital signs in last 24 hours: Temp:  [98.3 F (36.8 C)-99.5 F (37.5 C)] 99.5 F (37.5 C) (04/14 0700) Pulse Rate:  [83-152] 143 (04/14 0800) Resp:  [17-31] 24 (04/14 0800) BP: (135-178)/(57-121) 178/89 mmHg (04/14 0800) SpO2:  [92 %-100 %] 94 % (04/14 0800) Weight:  [167 lb 5.3 oz (75.9 kg)] 167 lb 5.3 oz (75.9 kg) (04/14 0400) Last BM Date: 02/09/15 (per family) General:    white female in NAD a little agitated, NG draining Heart: tachy irr Regular rate and rhythm; no murmurs Lungs: Respirations even and unlabored, lungs CTA bilaterally Abdomen:  Soft, distended, bowel sounds present. Extremities:  Without edema. Neurologic:  Alert and oriented,  grossly normal neurologically.-baseline for pt Psych:  Cooperative. Normal mood and affect.  Intake/Output from previous day: 04/13 0701 - 04/14 0700 In: 2032 [I.V.:1632; IV Piggyback:400] Out: 3950 [Urine:2475; Emesis/NG output:1475] Intake/Output this shift: Total I/O In: -  Out: 300 [Urine:300]  Lab Results:  C diff pending  Recent Labs  02/11/15 1525 02/12/15 0438  WBC 3.9* 4.0  HGB 10.6* 10.2*  HCT 31.0* 29.4*  PLT 172 176   BMET  Recent Labs  02/11/15 1525 02/12/15 0438  NA 137 140  K 3.6 2.9*  CL 102 106  CO2 26 26  GLUCOSE 194* 199*  BUN 15 11  CREATININE 0.66 0.69  CALCIUM 7.9* 7.7*   LFT  Recent Labs  02/11/15 1830  PROT 5.5*  ALBUMIN 2.5*  AST 19  ALT 17  ALKPHOS 53  BILITOT 1.0  BILIDIR 0.2  IBILI 0.8   PT/INR No results for input(s): LABPROT, INR in the last 72 hours.  Studies/Results: Ct Head Wo Contrast  02/11/2015   CLINICAL DATA:  Altered mental status today.  EXAM: CT HEAD WITHOUT CONTRAST  TECHNIQUE: Contiguous axial images were obtained from the  base of the skull through the vertex without intravenous contrast.  COMPARISON:  02/09/2015  FINDINGS: The ventricles are normal in size and configuration. No extra-axial fluid collections are identified. The gray-white differentiation is normal. No CT findings for acute intracranial process such as hemorrhage or infarction. No mass lesions. The brainstem and cerebellum are grossly normal.  The bony structures are intact. The paranasal sinuses and mastoid air cells are clear. The globes are intact.  IMPRESSION: Normal head CT.  No change since 2 days ago.   Electronically Signed   By: Rudie MeyerP.  Gallerani M.D.   On: 02/11/2015 21:44   Ct Abdomen Pelvis W Contrast  02/11/2015   CLINICAL DATA:  Abdominal pain and distention, decreased bowel sounds.  EXAM: CT ABDOMEN AND PELVIS WITH CONTRAST  TECHNIQUE: Multidetector CT imaging of the abdomen and pelvis was performed using the standard protocol following bolus administration of intravenous contrast.  CONTRAST:  100mL OMNIPAQUE IOHEXOL 300 MG/ML  SOLN  COMPARISON:  Radiographs 1 day prior, CT abdomen 2 days prior.  FINDINGS: Bibasilar atelectasis, minimal pleural thickening dependently.  Enteric tube in place, tip in the duodenum. There is fluid distending the stomach. Diffusely fluid-filled small bowel with small bowel dilatation, measuring up to 4.9 cm. No definite transition point, the distal small bowel loops are decompressed. There is diffuse colonic wall thickening with surrounding perienteric inflammatory change from the mid transverse  colon to the rectum. The distal colon is fluid-filled. The more proximal colon is normal in caliber with small volume of stool. The appendix is normal.  No free air or pneumatosis. Small amount of free fluid in the perisplenic space and pelvis is likely reactive. The mesenteric vessels appear patent.  No focal hepatic lesion. There is an elongated left hepatic lobe. The gallbladder is physiologically distended. The spleen and pancreas  are unremarkable. Unchanged adrenal nodularity. Kidneys demonstrate symmetric enhancement and excretion. No hydronephrosis or perinephric stranding.  Abdominal aorta is normal in caliber, minimal atherosclerosis. No retroperitoneal adenopathy. Mild whole body wall edema.  Within the pelvis the urinary bladder is decompressed by Foley catheter. Uterus remains in situ, atrophic, normal for age. No adnexal mass.  There are no acute or suspicious osseous abnormalities. Remote lower left rib fractures.  IMPRESSION: 1. Colitis extending from the mid transverse colon through the rectum. Findings may be infectious or inflammatory. There is no perforation or abscess. 2. Dilated and fluid-filled small bowel loops in the central and lower abdomen. This may be reactive ileus versus small bowel enteritis. 3. Small amount of free fluid in the pelvis and perisplenic space.   Electronically Signed   By: Rubye Oaks M.D.   On: 02/11/2015 21:53   Dg Chest Port 1 View  02/11/2015   CLINICAL DATA:  Central catheter placement  EXAM: PORTABLE CHEST - 1 VIEW  COMPARISON:  February 10, 2015  FINDINGS: Central catheter tip is at the cavoatrial junction. Nasogastric tube tip and side port are in the distal stomach. No pneumothorax. There is no edema or consolidation. Heart is upper normal in size with pulmonary vascularity within normal limits. No adenopathy.  IMPRESSION: Tube and catheter positions as described without pneumothorax. No edema or consolidation.   Electronically Signed   By: Bretta Bang III M.D.   On: 02/11/2015 17:33       Assessment / Plan:   #1 54 yo female with acute illness with encephalopathy, acute abdominal distention, and significant left sided colitis on CT, and new onset Afib. Not clear what event triggered illness- suspect from GI standpoint this is an acute ischemic colitis  Encephalopathy much improved Abdomen remains distended, and CT consistent with Ileus- leave NG , also NG was in  duodenum-will pull back Continue empiric flagyl. Plain films in am #2 Atrial fib with RVR- per CCM needs anticoagulated- she is a t least at moderate risk for bleeding with an acute ischemic colitis- could start heparin and watch carefully for bleeding. Once colon heals should be fine to anticoagulate. She had colonoscopy one year ago with Dr Darrick Penna- we do not plan on colonoscopy .   Active Problems:   Atrial fibrillation with RVR   GI bleed   Abdominal distention   Noninfectious gastroenteritis and colitis   Diabetes mellitus type 2 in nonobese   Acute encephalopathy     LOS: 1 day   Amy Esterwood  02/12/2015, 10:17 AM     Attending physician's note   I have taken an interval history, reviewed the chart and examined the patient. I agree with the Advanced Practitioner's note, impression and recommendations. Presumed ischemic colitis and ileus. Ischemic colitis will be at increased risk for bleeding with anticoagulation until it has healed-which will likely take at least 1-2 weeks. No plans to repeat her colonoscopy. Continue NG suction until ileus has substantially improved.   Venita Lick. Russella Dar, MD [facility]

## 2015-02-12 NOTE — Progress Notes (Signed)
Patient ID: Natasha Martin, female   DOB: [birth year], 54 y.o.   MRN: [id number]  General Surgery [facility]  Subjective: Patient now awake, talking, wants to eat.  In ICU/stepdown.  Denies pain.  Objective: Vital signs in last 24 hours: Temp:  [98.3 F (36.8 C)-99.5 F (37.5 C)] 99.5 F (37.5 C) (04/14 0700) Pulse Rate:  [83-152] 143 (04/14 0800) Resp:  [17-31] 24 (04/14 0800) BP: (135-178)/(57-121) 178/89 mmHg (04/14 0800) SpO2:  [92 %-100 %] 94 % (04/14 0800) Weight:  [75.9 kg (167 lb 5.3 oz)] 75.9 kg (167 lb 5.3 oz) (04/14 0400) Last BM Date: 02/09/15 (per family)  Intake/Output from previous day: 04/13 0701 - 04/14 0700 In: 2032 [I.V.:1632; IV Piggyback:400] Out: 3950 [Urine:2475; Emesis/NG output:1475] Intake/Output this shift: Total I/O In: -  Out: 300 [Urine:300]  Physical Exam: HEENT - sclerae clear, mucous membranes moist Neck - soft Chest - clear bilaterally Cor - tachycardic Abdomen - protuberant, few BS present; non-tender; no mass Ext - no edema, non-tender  Lab Results:   Recent Labs  02/11/15 1525 02/12/15 0438  WBC 3.9* 4.0  HGB 10.6* 10.2*  HCT 31.0* 29.4*  PLT 172 176   BMET  Recent Labs  02/11/15 1525 02/12/15 0438  NA 137 140  K 3.6 2.9*  CL 102 106  CO2 26 26  GLUCOSE 194* 199*  BUN 15 11  CREATININE 0.66 0.69  CALCIUM 7.9* 7.7*   PT/INR No results for input(s): LABPROT, INR in the last 72 hours. Comprehensive Metabolic Panel:    Component Value Date/Time   NA 140 02/12/2015 0438   NA 137 02/11/2015 1525   K 2.9* 02/12/2015 0438   K 3.6 02/11/2015 1525   CL 106 02/12/2015 0438   CL 102 02/11/2015 1525   CO2 26 02/12/2015 0438   CO2 26 02/11/2015 1525   BUN 11 02/12/2015 0438   BUN 15 02/11/2015 1525   CREATININE 0.69 02/12/2015 0438   CREATININE 0.66 02/11/2015 1525   GLUCOSE 199* 02/12/2015 0438   GLUCOSE 194* 02/11/2015 1525   CALCIUM 7.7* 02/12/2015 0438   CALCIUM 7.9* 02/11/2015 1525   AST  19 02/11/2015 1830   AST 21 02/11/2015 1525   ALT 17 02/11/2015 1830   ALT 17 02/11/2015 1525   ALKPHOS 53 02/11/2015 1830   ALKPHOS 55 02/11/2015 1525   BILITOT 1.0 02/11/2015 1830   BILITOT 0.8 02/11/2015 1525   PROT 5.5* 02/11/2015 1830   PROT 6.0 02/11/2015 1525   ALBUMIN 2.5* 02/11/2015 1830   ALBUMIN 2.6* 02/11/2015 1525    Studies/Results: Ct Head Wo Contrast  02/11/2015   CLINICAL DATA:  Altered mental status today.  EXAM: CT HEAD WITHOUT CONTRAST  TECHNIQUE: Contiguous axial images were obtained from the base of the skull through the vertex without intravenous contrast.  COMPARISON:  02/09/2015  FINDINGS: The ventricles are normal in size and configuration. No extra-axial fluid collections are identified. The gray-white differentiation is normal. No CT findings for acute intracranial process such as hemorrhage or infarction. No mass lesions. The brainstem and cerebellum are grossly normal.  The bony structures are intact. The paranasal sinuses and mastoid air cells are clear. The globes are intact.  IMPRESSION: Normal head CT.  No change since 2 days ago.   Electronically Signed   By: Rudie MeyerP.  Gallerani M.D.   On: 02/11/2015 21:44   Ct Abdomen Pelvis W Contrast  02/11/2015   CLINICAL DATA:  Abdominal pain and distention, decreased bowel sounds.  EXAM: CT ABDOMEN AND PELVIS WITH CONTRAST  TECHNIQUE: Multidetector CT imaging of the abdomen and pelvis was performed using the standard protocol following bolus administration of intravenous contrast.  CONTRAST:  OMNIPAQUE IOHEXOL 300 MG/ML  SOLN  COMPARISON:  Radiographs 1 day prior, CT abdomen 2 days prior.  FINDINGS: Bibasilar atelectasis, minimal pleural thickening dependently.  Enteric tube in place, tip in the duodenum. There is fluid distending the stomach. Diffusely fluid-filled small bowel with small bowel dilatation, measuring up to 4.9 cm. No definite transition point, the distal small bowel loops are decompressed. There is diffuse  colonic wall thickening with surrounding perienteric inflammatory change from the mid transverse colon to the rectum. The distal colon is fluid-filled. The more proximal colon is normal in caliber with small volume of stool. The appendix is normal.  No free air or pneumatosis. Small amount of free fluid in the perisplenic space and pelvis is likely reactive. The mesenteric vessels appear patent.  No focal hepatic lesion. There is an elongated left hepatic lobe. The gallbladder is physiologically distended. The spleen and pancreas are unremarkable. Unchanged adrenal nodularity. Kidneys demonstrate symmetric enhancement and excretion. No hydronephrosis or perinephric stranding.  Abdominal aorta is normal in caliber, minimal atherosclerosis. No retroperitoneal adenopathy. Mild whole body wall edema.  Within the pelvis the urinary bladder is decompressed by Foley catheter. Uterus remains in situ, atrophic, normal for age. No adnexal mass.  There are no acute or suspicious osseous abnormalities. Remote lower left rib fractures.  IMPRESSION: 1. Colitis extending from the mid transverse colon through the rectum. Findings may be infectious or inflammatory. There is no perforation or abscess. 2. Dilated and fluid-filled small bowel loops in the central and lower abdomen. This may be reactive ileus versus small bowel enteritis. 3. Small amount of free fluid in the pelvis and perisplenic space.   Electronically Signed   By: Rubye Oaks M.D.   On: 02/11/2015 21:53   Dg Chest Port 1 View  02/11/2015   CLINICAL DATA:  Central catheter placement  EXAM: PORTABLE CHEST - 1 VIEW  COMPARISON:  February 10, 2015  FINDINGS: Central catheter tip is at the cavoatrial junction. Nasogastric tube tip and side port are in the distal stomach. No pneumothorax. There is no edema or consolidation. Heart is upper normal in size with pulmonary vascularity within normal limits. No adenopathy.  IMPRESSION: Tube and catheter positions as  described without pneumothorax. No edema or consolidation.   Electronically Signed   By: Bretta Bang III M.D.   On: 02/11/2015 17:33    Anti-infectives: Anti-infectives    Start     Dose/Rate Route Frequency Ordered Stop   02/11/15 1800  metroNIDAZOLE (FLAGYL) IVPB 500 mg     500 mg 100 mL/hr over 60 Minutes Intravenous 4 times per day 02/11/15 1638     02/11/15 1800  piperacillin-tazobactam (ZOSYN) IVPB 3.375 g     3.375 g 12.5 mL/hr over 240 Minutes Intravenous Every 8 hours 02/11/15 1650        Assessment & Plans: 1.  Encephalopathy - ?etiology 2.  Colitis - likely infectious  Discussed with CCM and GI  No surgical issues at present  Will sign off but will be available should clinical course change.  Velora Heckler, MD, [facility] Office: [phone]   Natasha Martin 02/12/2015

## 2015-02-12 NOTE — Progress Notes (Signed)
EEG Completed; Results Pending  

## 2015-02-12 NOTE — Progress Notes (Signed)
eLink Physician-Brief Progress Note Patient Name: Natasha Martin DOB: 01/02/1961 MRN: [id number]   Date of Service  02/12/2015  HPI/Events of Note  hypokalemia  eICU Interventions  Potassium replaced     Intervention Category Minor Interventions: Electrolytes abnormality - evaluation and management  DETERDING,ELIZABETH 02/12/2015, 6:47 AM

## 2015-02-12 NOTE — Procedures (Signed)
ELECTROENCEPHALOGRAM REPORT   Patient: Natasha Martin       Room #: [id number] EEG No. ID: [id number] Age: 54 y.o.        Sex: female Referring Physician: Elgergawy Report Date:  02/12/2015        Interpreting Physician: Thana FarrEYNOLDS, Vinetta Brach  History: Natasha Martin is an 54 y.o. female with altered mental status  Medications:  Scheduled: . carbamazepine  400 mg Oral BID  . famotidine  20 mg Oral Daily  . magnesium sulfate 1 - 4 g bolus IVPB  2 g Intravenous Once  . metoprolol  5 mg Intravenous 4 times per day  . metronidazole  500 mg Intravenous 4 times per day  . piperacillin-tazobactam (ZOSYN)  IV  3.375 g Intravenous Q8H  . QUEtiapine  800 mg Oral QHS    Conditions of Recording:  This is a 16 channel EEG carried out with the patient in the awake state.  Description:  The background activity consists of a low voltage, poorly organized mixture of frequencies.  Intermixed disorganized theta and delta rhythms are most prominent.  There is noted overlying beta activity at times as well which is noted bilaterally.  This background rhythm is continuous and diffusely distributed.  There are frequent intermittent discharges of triphasic morphology.  They are most prominently located in the central and occipital regions bilaterally.   The patient does not drowse or sleep during the tracing.   Hyperventilation and intermittent photic stimulation were not performed.  IMPRESSION: This an abnormal electroencephalogram secondary to the presence of intermittent triphasic activity.  This finding is characteristic of an encephalopathy that is etiology nonspecific but commonly seen in hepatic encephalopathy.     Thana FarrLeslie Demitrios Molyneux, MD [facility] [phone] 02/12/2015, 4:27 PM

## 2015-02-12 NOTE — Progress Notes (Signed)
Notified Elink Nurse in regards to pt on clear liquid diet, abdomen distended, day shift nurse placed back on low intermittent suction via NG. Pt HR is in 140s, on amio, cardizem. Pt is asymptomatic but hypertensive. Will continue to monitor and assess.

## 2015-02-12 NOTE — Progress Notes (Signed)
*  Preliminary Results* Bilateral lower extremity venous duplex completed. Bilateral lower extremities are negative for deep vein thrombosis. There is no evidence of Baker's cyst bilaterally.  02/12/2015  Gertie FeyMichelle Lulia Schriner, RVT, RDCS, RDMS

## 2015-02-12 NOTE — Progress Notes (Signed)
  Echocardiogram 2D Echocardiogram has been performed.  Janalyn HarderWest, Hadiyah Maricle R 02/12/2015, 9:45 AM

## 2015-02-12 NOTE — Progress Notes (Signed)
SUBJECTIVE:   Complains of being hungry  OBJECTIVE:   Vitals:   Filed Vitals:   02/12/15 0500 02/12/15 0600 02/12/15 0700 02/12/15 0800  BP: 141/75 155/81  178/89  Pulse: 143 141  143  Temp:   99.5 F (37.5 C)   TempSrc:   Axillary   Resp: 29 24  24   Height:      Weight:      SpO2: 95% 94%  94%   I&O's:   Intake/Output Summary (Last 24 hours) at 02/12/15 1000 Last data filed at 02/12/15 0800  Gross per 24 hour  Intake 2031.95 ml  Output   4250 ml  Net -2218.05 ml   TELEMETRY: Reviewed telemetry pt in atrial fibrillation with RVR in the 130's:     PHYSICAL EXAM General: Well developed, well nourished, in no acute distress Head: Eyes PERRLA, No xanthomas.   Normal cephalic and atramatic  Lungs:   Clear bilaterally to auscultation and percussion. Heart:   Irregularly irregular and tachy S1 S2 Pulses are 2+ & equal. Abdomen: Bowel sounds are positive, abdomen soft and non-tender without masses Extremities:   No clubbing, cyanosis or edema.  DP +1 Neuro: Alert and oriented X 3. Psych:  Good affect, responds appropriately   LABS: Basic Metabolic Panel:  Recent Labs  16/08/9603/13/16 1525 02/12/15 0438  NA 137 140  K 3.6 2.9*  CL 102 106  CO2 26 26  GLUCOSE 194* 199*  BUN 15 11  CREATININE 0.66 0.69  CALCIUM 7.9* 7.7*   Liver Function Tests:  Recent Labs  02/11/15 1525 02/11/15 1830  AST 21 19  ALT 17 17  ALKPHOS 55 53  BILITOT 0.8 1.0  PROT 6.0 5.5*  ALBUMIN 2.6* 2.5*   No results for input(s): LIPASE, AMYLASE in the last 72 hours. CBC:  Recent Labs  02/11/15 1525 02/12/15 0438  WBC 3.9* 4.0  HGB 10.6* 10.2*  HCT 31.0* 29.4*  MCV 91.7 90.7  PLT 172 176   Cardiac Enzymes:  Recent Labs  02/11/15 1830 02/11/15 2255 02/12/15 0438  CKTOTAL 41  --   --   CKMB 11.4*  --   --   TROPONINI 0.04* 0.04* <0.03   BNP: Invalid input(s): POCBNP D-Dimer: No results for input(s): DDIMER in the last 72 hours. Hemoglobin A1C: No results for  input(s): HGBA1C in the last 72 hours. Fasting Lipid Panel: No results for input(s): CHOL, HDL, LDLCALC, TRIG, CHOLHDL, LDLDIRECT in the last 72 hours. Thyroid Function Tests:  Recent Labs  02/11/15 1830  TSH 1.154   Anemia Panel: No results for input(s): VITAMINB12, FOLATE, FERRITIN, TIBC, IRON, RETICCTPCT in the last 72 hours. Coag Panel:   No results found for: INR, PROTIME  RADIOLOGY: Ct Head Wo Contrast  02/11/2015   CLINICAL DATA:  Altered mental status today.  EXAM: CT HEAD WITHOUT CONTRAST  TECHNIQUE: Contiguous axial images were obtained from the base of the skull through the vertex without intravenous contrast.  COMPARISON:  02/09/2015  FINDINGS: The ventricles are normal in size and configuration. No extra-axial fluid collections are identified. The gray-white differentiation is normal. No CT findings for acute intracranial process such as hemorrhage or infarction. No mass lesions. The brainstem and cerebellum are grossly normal.  The bony structures are intact. The paranasal sinuses and mastoid air cells are clear. The globes are intact.  IMPRESSION: Normal head CT.  No change since 2 days ago.   Electronically Signed   By: Rudie MeyerP.  Gallerani M.D.   On: 02/11/2015  21:44   Ct Abdomen Pelvis W Contrast  02/11/2015   CLINICAL DATA:  Abdominal pain and distention, decreased bowel sounds.  EXAM: CT ABDOMEN AND PELVIS WITH CONTRAST  TECHNIQUE: Multidetector CT imaging of the abdomen and pelvis was performed using the standard protocol following bolus administration of intravenous contrast.  CONTRAST:  OMNIPAQUE IOHEXOL 300 MG/ML  SOLN  COMPARISON:  Radiographs 1 day prior, CT abdomen 2 days prior.  FINDINGS: Bibasilar atelectasis, minimal pleural thickening dependently.  Enteric tube in place, tip in the duodenum. There is fluid distending the stomach. Diffusely fluid-filled small bowel with small bowel dilatation, measuring up to 4.9 cm. No definite transition point, the distal small bowel  loops are decompressed. There is diffuse colonic wall thickening with surrounding perienteric inflammatory change from the mid transverse colon to the rectum. The distal colon is fluid-filled. The more proximal colon is normal in caliber with small volume of stool. The appendix is normal.  No free air or pneumatosis. Small amount of free fluid in the perisplenic space and pelvis is likely reactive. The mesenteric vessels appear patent.  No focal hepatic lesion. There is an elongated left hepatic lobe. The gallbladder is physiologically distended. The spleen and pancreas are unremarkable. Unchanged adrenal nodularity. Kidneys demonstrate symmetric enhancement and excretion. No hydronephrosis or perinephric stranding.  Abdominal aorta is normal in caliber, minimal atherosclerosis. No retroperitoneal adenopathy. Mild whole body wall edema.  Within the pelvis the urinary bladder is decompressed by Foley catheter. Uterus remains in situ, atrophic, normal for age. No adnexal mass.  There are no acute or suspicious osseous abnormalities. Remote lower left rib fractures.  IMPRESSION: 1. Colitis extending from the mid transverse colon through the rectum. Findings may be infectious or inflammatory. There is no perforation or abscess. 2. Dilated and fluid-filled small bowel loops in the central and lower abdomen. This may be reactive ileus versus small bowel enteritis. 3. Small amount of free fluid in the pelvis and perisplenic space.   Electronically Signed   By: Rubye Oaks M.D.   On: 02/11/2015 21:53   Dg Chest Port 1 View  02/11/2015   CLINICAL DATA:  Central catheter placement  EXAM: PORTABLE CHEST - 1 VIEW  COMPARISON:  February 10, 2015  FINDINGS: Central catheter tip is at the cavoatrial junction. Nasogastric tube tip and side port are in the distal stomach. No pneumothorax. There is no edema or consolidation. Heart is upper normal in size with pulmonary vascularity within normal limits. No adenopathy.   IMPRESSION: Tube and catheter positions as described without pneumothorax. No edema or consolidation.   Electronically Signed   By: Bretta Bang III M.D.   On: 02/11/2015 17:33    ASSESSMENT AND PLAN:   1. Atrial flutter with RVR: Rate is still poorly controlled . BP is stable. She is currently on an amiodarone drip and a diltiazem drip. I will give her another bolus of Amio IV and start Lopressor 2.5mg  IV q 6 hours.  She was on Atenolol at home.   Echo today to assess LV function once HR better controlled. No anti-coagulation currently with GI bleeding 2.  HTN - BP elevated today which should improve on BB 3.  GI bleed   Quintella Reichert, MD  02/12/2015  10:00 AM

## 2015-02-12 NOTE — Progress Notes (Addendum)
Patient Demographics  Natasha Martin, is a 54 y.o. female, DOB - Aug 19, 1961, ZOX:[id number]  Admit date - 02/11/2015   Admitting Physician Starleen Arms, MD  Outpatient Primary MD for the patient is PROVIDER NOT IN SYSTEM  LOS - 1   No chief complaint on file.     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 fib with RVR, abdominal distention with colitis, transferred to [facility] on 4/13, for further management and treatment of her A. fib, abdominal distention and colitis, and altered mental status.  Subjective:   Natasha Martin today has, No headache, No chest pain, No abdominal pain - No Nausea, reports she is hungry .  Assessment & Plan    Active Problems:   Atrial fibrillation with RVR   GI bleed   Abdominal distention   Noninfectious gastroenteritis and colitis   Diabetes mellitus type 2 in nonobese   Acute encephalopathy   Ileus   Acute ischemic colitis  Acute encephalopathy - Most likely metabolic in the setting of hyponatremia, intra-abdominal process. - Difficult improvement, almost at baseline per family. - CT head without contrast 4/13 no acute findings. - Ammonia within normal limits - Follow on EEG.  Atrial fibrillation/atrial flutter - New onset - Poorly controlled, on Cardizem and amiodarone drip, started on IV Lopressor today by cardiology. - No anti-coagulation currently giving her GI bleeding ( especially in the setting of possible ischemic colitis which will take 1-2 weeks to recover her GI) - 2-D echo showing EF 60-65%.  Colitis - Most likely ischemic, versus  infectious. - Continue with empiric IV antibiotic coverage Zosyn, as well empirically on Flagyl till C. difficile is ruled out.   Ileus - Continue with NG tube suction, keep nothing by mouth, her place electrolytes,  Type 2 diabetes mellitus - We'll monitor CBGs, if elevated will start on sliding scale   Hypokalemia - Repleted, recheck in a.m.Marland Kitchen  Elevated d-dimer - Shin had 2 diameters of 14 at [facility], will check CT angiogram to rule out PE.  Code Status: Full  Family Communication: None at bedside  Disposition Plan: Remains on step down   Procedures  None   Consults   Gen. Surgery Critical care [facility] nephrology Cardiology   Medications  Scheduled Meds: . carbamazepine  400 mg Oral BID  . famotidine  20 mg Oral Daily  . magnesium sulfate 1 - 4 g bolus IVPB  2 g Intravenous Once  . metoprolol  5 mg Intravenous 4 times per day  . metronidazole  500 mg Intravenous 4 times per day  . piperacillin-tazobactam (ZOSYN)  IV  3.375 g Intravenous Q8H  . QUEtiapine  800 mg Oral QHS   Continuous Infusions: . sodium chloride 75 mL/hr at 02/12/15 1300  . amiodarone 30 mg/hr (02/12/15 0916)  . diltiazem (CARDIZEM) infusion 15 mg/hr (02/12/15 1300)   PRN Meds:.albuterol, ondansetron **OR** ondansetron (ZOFRAN) IV  DVT Prophylaxis -SCDs   Lab Results  Component Value Date   PLT 176 02/12/2015    Antibiotics    Anti-infectives  Start     Dose/Rate Route Frequency Ordered Stop   02/11/15 1800  metroNIDAZOLE (FLAGYL) IVPB 500 mg     500 mg 100 mL/hr over 60 Minutes Intravenous 4 times per day 02/11/15 1638     02/11/15 1800  piperacillin-tazobactam (ZOSYN) IVPB 3.375 g     3.375 g 12.5 mL/hr over 240 Minutes Intravenous Every 8 hours 02/11/15 1650            Objective:   Filed Vitals:   02/12/15 0600 02/12/15 0700 02/12/15 0800 02/12/15 1149  BP: 155/81  178/89   Pulse: 141  143   Temp:  99.5 F (37.5 C)  98.6 F (37 C)  TempSrc:  Axillary   Oral  Resp: 24  24   Height:      Weight:      SpO2: 94%  94%     Wt Readings from Last 3 Encounters:  02/12/15 75.9 kg (167 lb 5.3 oz)  01/28/14 71.215 kg (157 lb)  01/13/14 70.035 kg (154 lb 6.4 oz)     Intake/Output Summary (Last 24 hours) at 02/12/15 1408 Last data filed at 02/12/15 1330  Gross per 24 hour  Intake 3178.85 ml  Output   5150 ml  Net -1971.15 ml     Physical Exam  Awake Alert, communicative, No new F.N deficits, Normal affect [place].AT,PERRAL Supple Neck,No JVD, No cervical lymphadenopathy appriciated.  Symmetrical Chest wall movement, Good air movement bilaterally, CTAB Irregular,No Gallops,Rubs or new Murmurs, No Parasternal Heave Diminished B.Sounds, Abd Soft, No tenderness, mildly distended ,No organomegaly appriciated, No rebound - guarding or rigidity. No Cyanosis, Clubbing or edema, No new Rash or bruise     Data Review   Micro Results Recent Results (from the past 240 hour(s))  MRSA PCR Screening     Status: None   Collection Time: 02/11/15  8:12 PM  Result Value Ref Range Status   MRSA by PCR NEGATIVE NEGATIVE Final    Comment:        The GeneXpert MRSA Assay (FDA approved for NASAL specimens only), is one component of a comprehensive MRSA colonization surveillance program. It is not intended to diagnose MRSA infection nor to guide or monitor treatment for MRSA infections.     Radiology Reports Ct Head Wo Contrast  02/11/2015   CLINICAL DATA:  Altered mental status today.  EXAM: CT HEAD WITHOUT CONTRAST  TECHNIQUE: Contiguous axial images were obtained from the base of the skull through the vertex without intravenous contrast.  COMPARISON:  02/09/2015  FINDINGS: The ventricles are normal in size and configuration. No extra-axial fluid collections are identified. The gray-white differentiation is normal. No CT findings for acute intracranial process such as hemorrhage or infarction. No mass lesions. The brainstem and cerebellum are  grossly normal.  The bony structures are intact. The paranasal sinuses and mastoid air cells are clear. The globes are intact.  IMPRESSION: Normal head CT.  No change since 2 days ago.   Electronically Signed   By: Rudie MeyerP.  Gallerani M.D.   On: 02/11/2015 21:44   Ct Abdomen Pelvis W Contrast  02/11/2015   CLINICAL DATA:  Abdominal pain and distention, decreased bowel sounds.  EXAM: CT ABDOMEN AND PELVIS WITH CONTRAST  TECHNIQUE: Multidetector CT imaging of the abdomen and pelvis was performed using the standard protocol following bolus administration of intravenous contrast.  CONTRAST:  100mL OMNIPAQUE IOHEXOL 300 MG/ML  SOLN  COMPARISON:  Radiographs 1 day prior, CT abdomen 2 days prior.  FINDINGS: Bibasilar atelectasis,  minimal pleural thickening dependently.  Enteric tube in place, tip in the duodenum. There is fluid distending the stomach. Diffusely fluid-filled small bowel with small bowel dilatation, measuring up to 4.9 cm. No definite transition point, the distal small bowel loops are decompressed. There is diffuse colonic wall thickening with surrounding perienteric inflammatory change from the mid transverse colon to the rectum. The distal colon is fluid-filled. The more proximal colon is normal in caliber with small volume of stool. The appendix is normal.  No free air or pneumatosis. Small amount of free fluid in the perisplenic space and pelvis is likely reactive. The mesenteric vessels appear patent.  No focal hepatic lesion. There is an elongated left hepatic lobe. The gallbladder is physiologically distended. The spleen and pancreas are unremarkable. Unchanged adrenal nodularity. Kidneys demonstrate symmetric enhancement and excretion. No hydronephrosis or perinephric stranding.  Abdominal aorta is normal in caliber, minimal atherosclerosis. No retroperitoneal adenopathy. Mild whole body wall edema.  Within the pelvis the urinary bladder is decompressed by Foley catheter. Uterus remains in situ,  atrophic, normal for age. No adnexal mass.  There are no acute or suspicious osseous abnormalities. Remote lower left rib fractures.  IMPRESSION: 1. Colitis extending from the mid transverse colon through the rectum. Findings may be infectious or inflammatory. There is no perforation or abscess. 2. Dilated and fluid-filled small bowel loops in the central and lower abdomen. This may be reactive ileus versus small bowel enteritis. 3. Small amount of free fluid in the pelvis and perisplenic space.   Electronically Signed   By: Rubye Oaks M.D.   On: 02/11/2015 21:53   Dg Chest Port 1 View  02/11/2015   CLINICAL DATA:  Central catheter placement  EXAM: PORTABLE CHEST - 1 VIEW  COMPARISON:  February 10, 2015  FINDINGS: Central catheter tip is at the cavoatrial junction. Nasogastric tube tip and side port are in the distal stomach. No pneumothorax. There is no edema or consolidation. Heart is upper normal in size with pulmonary vascularity within normal limits. No adenopathy.  IMPRESSION: Tube and catheter positions as described without pneumothorax. No edema or consolidation.   Electronically Signed   By: Bretta Bang III M.D.   On: 02/11/2015 17:33    CBC  Recent Labs Lab 02/11/15 1525 02/12/15 0438  WBC 3.9* 4.0  HGB 10.6* 10.2*  HCT 31.0* 29.4*  PLT 172 176  MCV 91.7 90.7  MCH 31.4 31.5  MCHC 34.2 34.7  RDW 12.8 12.8    Chemistries   Recent Labs Lab 02/11/15 1525 02/11/15 1830 02/12/15 0438  NA 137  --  140  K 3.6  --  2.9*  CL 102  --  106  CO2 26  --  26  GLUCOSE 194*  --  199*  BUN 15  --  11  CREATININE 0.66  --  0.69  CALCIUM 7.9*  --  7.7*  AST 21 19  --   ALT 17 17  --   ALKPHOS 55 53  --   BILITOT 0.8 1.0  --    ------------------------------------------------------------------------------------------------------------------ estimated creatinine clearance is 76.6 mL/min (by C-G formula based on Cr of  0.69). ------------------------------------------------------------------------------------------------------------------ No results for input(s): HGBA1C in the last 72 hours. ------------------------------------------------------------------------------------------------------------------ No results for input(s): CHOL, HDL, LDLCALC, TRIG, CHOLHDL, LDLDIRECT in the last 72 hours. ------------------------------------------------------------------------------------------------------------------  Recent Labs  02/11/15 1830  TSH 1.154   ------------------------------------------------------------------------------------------------------------------ No results for input(s): VITAMINB12, FOLATE, FERRITIN, TIBC, IRON, RETICCTPCT in the last 72 hours.  Coagulation profile No  results for input(s): INR, PROTIME in the last 168 hours.  No results for input(s): DDIMER in the last 72 hours.  Cardiac Enzymes  Recent Labs Lab 02/11/15 1830 02/11/15 2255 02/12/15 0438  CKMB 11.4*  --   --   TROPONINI 0.04* 0.04* <0.03   ------------------------------------------------------------------------------------------------------------------ Invalid input(s): POCBNP     Time Spent in minutes   40 minutes   Natasha Martin M.D on 02/12/2015 at 2:08 PM  Between 7am to 7pm - Pager - [phone]  After 7pm go to [email or web address] - password [email or web address]  And look for the night coverage person covering for me after hours  [facility] Office  [phone]   **Disclaimer: This note may have been dictated with voice recognition software. Similar sounding words can inadvertently be transcribed and this note may contain transcription errors which may not have been corrected upon publication of note.**

## 2015-02-13 ENCOUNTER — Inpatient Hospital Stay (HOSPITAL_COMMUNITY): Payer: Medicare Other

## 2015-02-13 DIAGNOSIS — I4892 Unspecified atrial flutter: Secondary | ICD-10-CM

## 2015-02-13 LAB — CBC
HCT: 28.3 % — ABNORMAL LOW (ref 36.0–46.0)
Hemoglobin: 10 g/dL — ABNORMAL LOW (ref 12.0–15.0)
MCH: 32.2 pg (ref 26.0–34.0)
MCHC: 35.3 g/dL (ref 30.0–36.0)
MCV: 91 fL (ref 78.0–100.0)
Platelets: 173 10*3/uL (ref 150–400)
RBC: 3.11 MIL/uL — ABNORMAL LOW (ref 3.87–5.11)
RDW: 13.1 % (ref 11.5–15.5)
WBC: 3.9 10*3/uL — ABNORMAL LOW (ref 4.0–10.5)

## 2015-02-13 LAB — GLUCOSE, CAPILLARY
GLUCOSE-CAPILLARY: 146 mg/dL — AB (ref 70–99)
GLUCOSE-CAPILLARY: 172 mg/dL — AB (ref 70–99)
GLUCOSE-CAPILLARY: 179 mg/dL — AB (ref 70–99)
GLUCOSE-CAPILLARY: 210 mg/dL — AB (ref 70–99)
GLUCOSE-CAPILLARY: 219 mg/dL — AB (ref 70–99)
Glucose-Capillary: 177 mg/dL — ABNORMAL HIGH (ref 70–99)
Glucose-Capillary: 198 mg/dL — ABNORMAL HIGH (ref 70–99)

## 2015-02-13 LAB — PROCALCITONIN: PROCALCITONIN: 1.6 ng/mL

## 2015-02-13 LAB — BASIC METABOLIC PANEL
Anion gap: 14 (ref 5–15)
BUN: 10 mg/dL (ref 6–23)
CO2: 22 mmol/L (ref 19–32)
CREATININE: 0.7 mg/dL (ref 0.50–1.10)
Calcium: 7.3 mg/dL — ABNORMAL LOW (ref 8.4–10.5)
Chloride: 99 mmol/L (ref 96–112)
GFR calc non Af Amer: >90 mL/min (ref >90)
GLUCOSE: 200 mg/dL — AB (ref 70–99)
POTASSIUM: 2.9 mmol/L — AB (ref 3.5–5.1)
SODIUM: 135 mmol/L (ref 135–145)

## 2015-02-13 LAB — MAGNESIUM: Magnesium: 1.7 mg/dL (ref 1.5–2.5)

## 2015-02-13 MED ORDER — POTASSIUM CHLORIDE CRYS ER 20 MEQ PO TBCR
40.0000 meq | EXTENDED_RELEASE_TABLET | Freq: Two times a day (BID) | ORAL | Status: AC
  Administered 2015-02-13 – 2015-02-14 (×3): 40 meq via ORAL
  Filled 2015-02-13 (×3): qty 2

## 2015-02-13 MED ORDER — METOPROLOL TARTRATE 1 MG/ML IV SOLN
5.0000 mg | INTRAVENOUS | Status: DC
  Administered 2015-02-13 – 2015-02-14 (×6): 5 mg via INTRAVENOUS
  Filled 2015-02-13 (×6): qty 5

## 2015-02-13 MED ORDER — INSULIN ASPART 100 UNIT/ML ~~LOC~~ SOLN
0.0000 [IU] | SUBCUTANEOUS | Status: DC
  Administered 2015-02-13: 2 [IU] via SUBCUTANEOUS
  Administered 2015-02-13: 1 [IU] via SUBCUTANEOUS
  Administered 2015-02-13: 2 [IU] via SUBCUTANEOUS
  Administered 2015-02-14: 5 [IU] via SUBCUTANEOUS
  Administered 2015-02-14: 2 [IU] via SUBCUTANEOUS
  Administered 2015-02-14: 3 [IU] via SUBCUTANEOUS
  Administered 2015-02-14: 1 [IU] via SUBCUTANEOUS
  Administered 2015-02-14: 2 [IU] via SUBCUTANEOUS
  Administered 2015-02-14 – 2015-02-15 (×4): 5 [IU] via SUBCUTANEOUS
  Administered 2015-02-15 (×2): 2 [IU] via SUBCUTANEOUS
  Administered 2015-02-15 (×2): 5 [IU] via SUBCUTANEOUS
  Administered 2015-02-16: 7 [IU] via SUBCUTANEOUS
  Administered 2015-02-16: 5 [IU] via SUBCUTANEOUS
  Administered 2015-02-16: 3 [IU] via SUBCUTANEOUS
  Administered 2015-02-16: 7 [IU] via SUBCUTANEOUS
  Administered 2015-02-16: 2 [IU] via SUBCUTANEOUS
  Administered 2015-02-17: 3 [IU] via SUBCUTANEOUS
  Administered 2015-02-17: 5 [IU] via SUBCUTANEOUS

## 2015-02-13 MED ORDER — HEPARIN SODIUM (PORCINE) 5000 UNIT/ML IJ SOLN
5000.0000 [IU] | Freq: Three times a day (TID) | INTRAMUSCULAR | Status: DC
  Administered 2015-02-13 – 2015-02-19 (×19): 5000 [IU] via SUBCUTANEOUS
  Filled 2015-02-13 (×17): qty 1

## 2015-02-13 MED ORDER — MAGNESIUM SULFATE 2 GM/50ML IV SOLN
2.0000 g | Freq: Once | INTRAVENOUS | Status: AC
  Administered 2015-02-13: 2 g via INTRAVENOUS
  Filled 2015-02-13: qty 50

## 2015-02-13 MED ORDER — POTASSIUM CHLORIDE 10 MEQ/50ML IV SOLN
10.0000 meq | INTRAVENOUS | Status: AC
  Administered 2015-02-13 (×6): 10 meq via INTRAVENOUS
  Filled 2015-02-13 (×6): qty 50

## 2015-02-13 MED ORDER — AMIODARONE IV BOLUS ONLY 150 MG/100ML
150.0000 mg | Freq: Once | INTRAVENOUS | Status: AC
  Administered 2015-02-13: 150 mg via INTRAVENOUS
  Filled 2015-02-13: qty 100

## 2015-02-13 MED ORDER — POTASSIUM CHLORIDE CRYS ER 20 MEQ PO TBCR: 40.0000 meq | EXTENDED_RELEASE_TABLET | Freq: Every day | ORAL | Status: DC

## 2015-02-13 NOTE — Progress Notes (Signed)
Patient Demographics  Natasha Martin, is a 54 y.o. female, DOB - 09-21-1961, XWR:[id number]  Admit date - 02/11/2015   Admitting Physician Starleen Arms, MD  Outpatient Primary MD for the patient is PROVIDER NOT IN SYSTEM   LOS - 2   No chief complaint on file.     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 fib with RVR, abdominal distention with colitis, transferred to [facility] on 4/13, for further management and treatment of her A. fib, abdominal distention and colitis, and altered mental status.  Subjective:   Natasha Martin today has, No headache, No chest pain, No abdominal pain - No Nausea, asking if she can have breakfast or soda .  Assessment & Plan    Active Problems:   Atrial fibrillation with RVR   GI bleed   Abdominal distention   Noninfectious gastroenteritis and colitis   Diabetes mellitus type 2 in nonobese   Acute encephalopathy   Ileus   Acute ischemic colitis   Atrial flutter with rapid ventricular response  Acute encephalopathy - Most likely metabolic in the setting of hyponatremia, intra-abdominal process, with possible polypharmacy as she is on multiple high-dose antipsychotic medication. - CT head without contrast 4/13 no acute findings. - Ammonia within normal limits - EEG finding consistent with hepatic encephalopathy. - resolved  at baseline per family.  Atrial fibrillation/atrial flutter - New onset - Poorly controlled, on Cardizem and amiodarone drip, IV Lopressor was increased by cardiology today, as well received IV amiodarone bolus. - No anti-coagulation  currently giving her GI bleeding ( especially in the setting of possible ischemic colitis which will take 1-2 weeks to recover her GI) - 2-D echo showing EF 60-65%.  Colitis - Most likely ischemic, versus infectious. - Continue with empiric IV antibiotic coverage Zosyn, as well empirically on Flagyl till C. difficile is ruled out. - No evidence of active GI bleed currently, so we'll start on subcutaneous heparin for DVT prophylaxis.  Ileus - Continue with NG tube suction, follow on abdominal x-ray today, if improving may advance diet, continue with aggressive electrolyte replacement.  Type 2 diabetes mellitus - Related CBGs, will start on insulin sliding scale.   Hypokalemia - Repleted, will recheck in a.m.Marland Kitchen - We'll start on daily potassium supplement  Elevated d-dimer - She had 2 diameters of 14 at [facility], CT chest angiogram negative for PE, lower extremity Dopplers negative for DVT.  Schizophrenia - Resumed on Seroquel, will resume back on Haldol when more stable.  Code Status: Full  Family Communication: Spoke with sister over phone.  Disposition Plan: Remains on step down   Procedures  None   Consults   Gen. Surgery Critical care [facility] nephrology Cardiology   Medications  Scheduled Meds: . amiodarone  150 mg Intravenous Once  . carbamazepine  400 mg Oral BID  . famotidine  20 mg Oral Daily  . heparin subcutaneous  5,000 Units Subcutaneous 3 times per day  . metoprolol  5 mg Intravenous Q4H  . metronidazole  500 mg Intravenous 4  times per day  . piperacillin-tazobactam (ZOSYN)  IV  3.375 g Intravenous Q8H  . potassium chloride  10 mEq Intravenous Q1 Hr x 6  . potassium chloride  40 mEq Oral BID  . QUEtiapine  800 mg Oral QHS   Continuous Infusions: . sodium chloride 75 mL/hr at 02/12/15 2000  . amiodarone 30 mg/hr (02/13/15 0457)  . diltiazem (CARDIZEM) infusion 15 mg/hr (02/13/15 0555)   PRN Meds:.albuterol, ALPRAZolam, ondansetron **OR**  ondansetron (ZOFRAN) IV  DVT Prophylaxis -SCDs , subcutaneous heparin  Lab Results  Component Value Date   PLT 173 02/13/2015    Antibiotics    Anti-infectives    Start     Dose/Rate Route Frequency Ordered Stop   02/11/15 1800  metroNIDAZOLE (FLAGYL) IVPB 500 mg     500 mg 100 mL/hr over 60 Minutes Intravenous 4 times per day 02/11/15 1638     02/11/15 1800  piperacillin-tazobactam (ZOSYN) IVPB 3.375 g     3.375 g 12.5 mL/hr over 240 Minutes Intravenous Every 8 hours 02/11/15 1650            Objective:   Filed Vitals:   02/13/15 0000 02/13/15 0200 02/13/15 0400 02/13/15 0600  BP: 125/51 147/71 148/84 150/63  Pulse: 144 150 139 146  Temp: 98.5 F (36.9 C)  97.9 F (36.6 C)   TempSrc: Axillary  Axillary   Resp: 21 20 20 20   Height:      Weight:      SpO2: 96% 95% 97% 96%    Wt Readings from Last 3 Encounters:  02/12/15 75.9 kg (167 lb 5.3 oz)  01/28/14 71.215 kg (157 lb)  01/13/14 70.035 kg (154 lb 6.4 oz)     Intake/Output Summary (Last 24 hours) at 02/13/15 1116 Last data filed at 02/13/15 0600  Gross per 24 hour  Intake 2437.3 ml  Output   4525 ml  Net -2087.7 ml     Physical Exam  Awake Alert, communicative, No new F.N deficits, Normal affect .AT,PERRAL Supple Neck,No JVD, No cervical lymphadenopathy appriciated.  Symmetrical Chest wall movement, Good air movement bilaterally, CTAB Irregular,No Gallops,Rubs or new Murmurs, No Parasternal Heave Diminished B.Sounds, Abd Soft, No tenderness, mildly distended ,No organomegaly appriciated, No rebound - guarding or rigidity. No Cyanosis, Clubbing or edema, No new Rash or bruise     Data Review   Micro Results Recent Results (from the past 240 hour(s))  MRSA PCR Screening     Status: None   Collection Time: 02/11/15  8:12 PM  Result Value Ref Range Status   MRSA by PCR NEGATIVE NEGATIVE Final    Comment:        The GeneXpert MRSA Assay (FDA approved for NASAL specimens only), is one  component of a comprehensive MRSA colonization surveillance program. It is not intended to diagnose MRSA infection nor to guide or monitor treatment for MRSA infections.     Radiology Reports Ct Head Wo Contrast  02/11/2015   CLINICAL DATA:  Altered mental status today.  EXAM: CT HEAD WITHOUT CONTRAST  TECHNIQUE: Contiguous axial images were obtained from the base of the skull through the vertex without intravenous contrast.  COMPARISON:  02/09/2015  FINDINGS: The ventricles are normal in size and configuration. No extra-axial fluid collections are identified. The gray-white differentiation is normal. No CT findings for acute intracranial process such as hemorrhage or infarction. No mass lesions. The brainstem and cerebellum are grossly normal.  The bony structures are intact. The paranasal sinuses and mastoid air cells  are clear. The globes are intact.  IMPRESSION: Normal head CT.  No change since 2 days ago.   Electronically Signed   By: Rudie Meyer M.D.   On: 02/11/2015 21:44   Ct Angio Chest Pe W/cm &/or Wo Cm  02/12/2015   CLINICAL DATA:  Tachycardia with elevated D-dimer.  EXAM: CT ANGIOGRAPHY CHEST WITH CONTRAST  TECHNIQUE: Multidetector CT imaging of the chest was performed using the standard protocol during bolus administration of intravenous contrast. Multiplanar CT image reconstructions and MIPs were obtained to evaluate the vascular anatomy.  CONTRAST:  OMNIPAQUE IOHEXOL 350 MG/ML SOLN  COMPARISON:  Chest x-ray 02/11/2015 and CT abdomen 02/11/2015  FINDINGS: The lungs are adequately inflated with minimal linear scarring over the left lower lobe. There is a tiny amount of right pleural fluid and associated atelectasis. Airways are within normal.  There is mild prominence of the heart. There is a sub cm lymph node over the left pericardial phrenic angle. Pulmonary arterial system is normal without evidence of emboli. Right-sided aortic arch is present with aberrant left subclavian  artery coursing posterior to the esophagus. Nasogastric tube is present. There is no mediastinal, hilar or axillary adenopathy.  Images through the upper abdomen demonstrate minimal diffuse hepatic steatosis. Tiny amount of perihepatic fluid unchanged. Remainder of the exam is unchanged.  Review of the MIP images confirms the above findings.  IMPRESSION: No evidence pulmonary embolism.  Tiny amount of right pleural fluid minimal right basilar atelectasis. Linear scarring left base.  Mild cardiomegaly. Right-sided aortic arch with aberrant left subclavian artery.   Electronically Signed   By: Elberta Fortis M.D.   On: 02/12/2015 17:25   Ct Abdomen Pelvis W Contrast  02/11/2015   CLINICAL DATA:  Abdominal pain and distention, decreased bowel sounds.  EXAM: CT ABDOMEN AND PELVIS WITH CONTRAST  TECHNIQUE: Multidetector CT imaging of the abdomen and pelvis was performed using the standard protocol following bolus administration of intravenous contrast.  CONTRAST:  OMNIPAQUE IOHEXOL 300 MG/ML  SOLN  COMPARISON:  Radiographs 1 day prior, CT abdomen 2 days prior.  FINDINGS: Bibasilar atelectasis, minimal pleural thickening dependently.  Enteric tube in place, tip in the duodenum. There is fluid distending the stomach. Diffusely fluid-filled small bowel with small bowel dilatation, measuring up to 4.9 cm. No definite transition point, the distal small bowel loops are decompressed. There is diffuse colonic wall thickening with surrounding perienteric inflammatory change from the mid transverse colon to the rectum. The distal colon is fluid-filled. The more proximal colon is normal in caliber with small volume of stool. The appendix is normal.  No free air or pneumatosis. Small amount of free fluid in the perisplenic space and pelvis is likely reactive. The mesenteric vessels appear patent.  No focal hepatic lesion. There is an elongated left hepatic lobe. The gallbladder is physiologically distended. The spleen and  pancreas are unremarkable. Unchanged adrenal nodularity. Kidneys demonstrate symmetric enhancement and excretion. No hydronephrosis or perinephric stranding.  Abdominal aorta is normal in caliber, minimal atherosclerosis. No retroperitoneal adenopathy. Mild whole body wall edema.  Within the pelvis the urinary bladder is decompressed by Foley catheter. Uterus remains in situ, atrophic, normal for age. No adnexal mass.  There are no acute or suspicious osseous abnormalities. Remote lower left rib fractures.  IMPRESSION: 1. Colitis extending from the mid transverse colon through the rectum. Findings may be infectious or inflammatory. There is no perforation or abscess. 2. Dilated and fluid-filled small bowel loops in the central and lower  abdomen. This may be reactive ileus versus small bowel enteritis. 3. Small amount of free fluid in the pelvis and perisplenic space.   Electronically Signed   By: Rubye OaksMelanie  Ehinger M.D.   On: 02/11/2015 21:53   Dg Chest Port 1 View  02/11/2015   CLINICAL DATA:  Central catheter placement  EXAM: PORTABLE CHEST - 1 VIEW  COMPARISON:  February 10, 2015  FINDINGS: Central catheter tip is at the cavoatrial junction. Nasogastric tube tip and side port are in the distal stomach. No pneumothorax. There is no edema or consolidation. Heart is upper normal in size with pulmonary vascularity within normal limits. No adenopathy.  IMPRESSION: Tube and catheter positions as described without pneumothorax. No edema or consolidation.   Electronically Signed   By: Bretta BangWilliam  Woodruff III M.D.   On: 02/11/2015 17:33    CBC  Recent Labs Lab 02/11/15 1525 02/12/15 0438 02/13/15 0528  WBC 3.9* 4.0 3.9*  HGB 10.6* 10.2* 10.0*  HCT 31.0* 29.4* 28.3*  PLT 172 176 173  MCV 91.7 90.7 91.0  MCH 31.4 31.5 32.2  MCHC 34.2 34.7 35.3  RDW 12.8 12.8 13.1    Chemistries   Recent Labs Lab 02/11/15 1525 02/11/15 1830 02/12/15 0438 02/13/15 0528  NA 137  --  140 135  K 3.6  --  2.9* 2.9*  CL  102  --  106 99  CO2 26  --  26 22  GLUCOSE 194*  --  199* 200*  BUN 15  --  11 10  CREATININE 0.66  --  0.69 0.70  CALCIUM 7.9*  --  7.7* 7.3*  MG  --   --   --  1.7  AST 21 19  --   --   ALT 17 17  --   --   ALKPHOS 55 53  --   --   BILITOT 0.8 1.0  --   --    ------------------------------------------------------------------------------------------------------------------ estimated creatinine clearance is 76.6 mL/min (by C-G formula based on Cr of 0.7). ------------------------------------------------------------------------------------------------------------------ No results for input(s): HGBA1C in the last 72 hours. ------------------------------------------------------------------------------------------------------------------ No results for input(s): CHOL, HDL, LDLCALC, TRIG, CHOLHDL, LDLDIRECT in the last 72 hours. ------------------------------------------------------------------------------------------------------------------  Recent Labs  02/11/15 1830  TSH 1.154   ------------------------------------------------------------------------------------------------------------------ No results for input(s): VITAMINB12, FOLATE, FERRITIN, TIBC, IRON, RETICCTPCT in the last 72 hours.  Coagulation profile No results for input(s): INR, PROTIME in the last 168 hours.  No results for input(s): DDIMER in the last 72 hours.  Cardiac Enzymes  Recent Labs Lab 02/11/15 1830 02/11/15 2255 02/12/15 0438  CKMB 11.4*  --   --   TROPONINI 0.04* 0.04* <0.03   ------------------------------------------------------------------------------------------------------------------ Invalid input(s): POCBNP     Time Spent in minutes   40 minutes   Dawana Asper M.D on 02/13/2015 at 11:16 AM  Between 7am to 7pm - Pager - [phone]  After 7pm go to [email or web address] - password [email or web address]  And look for the night coverage person covering for me after hours  [facility]  Group Office  [phone]   **Disclaimer: This note may have been dictated with voice recognition software. Similar sounding words can inadvertently be transcribed and this note may contain transcription errors which may not have been corrected upon publication of note.**

## 2015-02-13 NOTE — Progress Notes (Signed)
Inpatient Diabetes Program Recommendations  AACE/ADA: New Consensus Statement on Inpatient Glycemic Control (2013)  Target Ranges:  Prepandial:   less than 140 mg/dL      Peak postprandial:   less than 180 mg/dL (1-2 hours)      Critically ill patients:  140 - 180 mg/dL    Results for Retta MacROWE, Ledia S (MRN [id number]) as of 02/13/2015 08:46  Ref. Range 02/11/2015 15:25 02/12/2015 04:38 02/13/2015 05:28  Glucose Latest Ref Range: 70-99 mg/dL 528194 (H) 413199 (H) 244200 (H)    Admit with: AMS/ Colitis/ A Fib w/ RVR  History: DM, HTN, Schizophrenia, Mental disability  Home DM Meds: Lantus 20 units QHS  Novolog 8 units QID for CBG > 150 mg/dl  Amaryl 4 mg bid  Glyburide 2.5 mg bid  Current DM Orders: None    MD- Lab glucose levels are elevated.  Patient takes Lantus and Novolog at home.  Please consider starting Novolog Sensitive SSI Q4 hours    Will follow Ambrose FinlandJeannine Johnston Glennie Rodda RN, MSN, CDE Diabetes Coordinator Inpatient Diabetes Program Team Pager: [phone] (8a-5p)

## 2015-02-13 NOTE — Progress Notes (Signed)
MD Sommers ordered for low intermittent suction to used, place pt on NPO diet except with medications. Will continue to monitor and assess.

## 2015-02-13 NOTE — Progress Notes (Signed)
SUBJECTIVE:  sleeping  OBJECTIVE:   Vitals:   Filed Vitals:   02/13/15 0000 02/13/15 0200 02/13/15 0400 02/13/15 0600  BP: 125/51 147/71 148/84 150/63  Pulse: 144 150 139 146  Temp: 98.5 F (36.9 C)  97.9 F (36.6 C)   TempSrc: Axillary  Axillary   Resp: Height:      Weight:      SpO2: 96% 95% 97% 96%   I&O's:   Intake/Output Summary (Last 24 hours) at 02/13/15 0914 Last data filed at 02/13/15 0600  Gross per 24 hour  Intake 2800.7 ml  Output   4525 ml  Net -1724.3 ml   TELEMETRY: Reviewed telemetry pt in atrial flutter with RVR at 110-120 bpm:     PHYSICAL EXAM General: Well developed, well nourished, in no acute distress Head: Eyes PERRLA, No xanthomas.   Normal cephalic and atramatic  Lungs:   Clear bilaterally to auscultation and percussion. Heart:   Irregularly irregular and tachyS1 S2 Pulses are 2+ & equal. Abdomen: Bowel sounds are positive, abdomen soft and non-tender without masses  Extremities:   No clubbing, cyanosis or edema.  DP +1   LABS: Basic Metabolic Panel:  Recent Labs  78/29/56 0438 02/13/15 0528  NA 140 135  K 2.9* 2.9*  CL 106 99  CO2 26 22  GLUCOSE 199* 200*  BUN 11 10  CREATININE 0.69 0.70  CALCIUM 7.7* 7.3*  MG  --  1.7   Liver Function Tests:  Recent Labs  02/11/15 1525 02/11/15 1830  AST 21 19  ALT 17 17  ALKPHOS 55 53  BILITOT 0.8 1.0  PROT 6.0 5.5*  ALBUMIN 2.6* 2.5*   No results for input(s): LIPASE, AMYLASE in the last 72 hours. CBC:  Recent Labs  02/12/15 0438 02/13/15 0528  WBC 4.0 3.9*  HGB 10.2* 10.0*  HCT 29.4* 28.3*  MCV 90.7 91.0  PLT 176 173   Cardiac Enzymes:  Recent Labs  02/11/15 1830 02/11/15 2255 02/12/15 0438  CKTOTAL 41  --   --   CKMB 11.4*  --   --   TROPONINI 0.04* 0.04* <0.03   BNP: Invalid input(s): POCBNP D-Dimer: No results for input(s): DDIMER in the last 72 hours. Hemoglobin A1C: No results for input(s): HGBA1C in the last 72 hours. Fasting Lipid  Panel: No results for input(s): CHOL, HDL, LDLCALC, TRIG, CHOLHDL, LDLDIRECT in the last 72 hours. Thyroid Function Tests:  Recent Labs  02/11/15 1830  TSH 1.154   Anemia Panel: No results for input(s): VITAMINB12, FOLATE, FERRITIN, TIBC, IRON, RETICCTPCT in the last 72 hours. Coag Panel:   No results found for: INR, PROTIME  RADIOLOGY: Ct Head Wo Contrast  02/11/2015   CLINICAL DATA:  Altered mental status today.  EXAM: CT HEAD WITHOUT CONTRAST  TECHNIQUE: Contiguous axial images were obtained from the base of the skull through the vertex without intravenous contrast.  COMPARISON:  02/09/2015  FINDINGS: The ventricles are normal in size and configuration. No extra-axial fluid collections are identified. The gray-white differentiation is normal. No CT findings for acute intracranial process such as hemorrhage or infarction. No mass lesions. The brainstem and cerebellum are grossly normal.  The bony structures are intact. The paranasal sinuses and mastoid air cells are clear. The globes are intact.  IMPRESSION: Normal head CT.  No change since 2 days ago.   Electronically Signed   By: Rudie Meyer M.D.   On: 02/11/2015 21:44   Ct Angio Chest Pe  W/cm &/or Wo Cm  02/12/2015   CLINICAL DATA:  Tachycardia with elevated D-dimer.  EXAM: CT ANGIOGRAPHY CHEST WITH CONTRAST  TECHNIQUE: Multidetector CT imaging of the chest was performed using the standard protocol during bolus administration of intravenous contrast. Multiplanar CT image reconstructions and MIPs were obtained to evaluate the vascular anatomy.  CONTRAST:  100mL OMNIPAQUE IOHEXOL 350 MG/ML SOLN  COMPARISON:  Chest x-ray 02/11/2015 and CT abdomen 02/11/2015  FINDINGS: The lungs are adequately inflated with minimal linear scarring over the left lower lobe. There is a tiny amount of right pleural fluid and associated atelectasis. Airways are within normal.  There is mild prominence of the heart. There is a sub cm lymph node over the left  pericardial phrenic angle. Pulmonary arterial system is normal without evidence of emboli. Right-sided aortic arch is present with aberrant left subclavian artery coursing posterior to the esophagus. Nasogastric tube is present. There is no mediastinal, hilar or axillary adenopathy.  Images through the upper abdomen demonstrate minimal diffuse hepatic steatosis. Tiny amount of perihepatic fluid unchanged. Remainder of the exam is unchanged.  Review of the MIP images confirms the above findings.  IMPRESSION: No evidence pulmonary embolism.  Tiny amount of right pleural fluid minimal right basilar atelectasis. Linear scarring left base.  Mild cardiomegaly. Right-sided aortic arch with aberrant left subclavian artery.   Electronically Signed   By: Elberta Fortisaniel  Boyle M.D.   On: 02/12/2015 17:25   Ct Abdomen Pelvis W Contrast  02/11/2015   CLINICAL DATA:  Abdominal pain and distention, decreased bowel sounds.  EXAM: CT ABDOMEN AND PELVIS WITH CONTRAST  TECHNIQUE: Multidetector CT imaging of the abdomen and pelvis was performed using the standard protocol following bolus administration of intravenous contrast.  CONTRAST:  100mL OMNIPAQUE IOHEXOL 300 MG/ML  SOLN  COMPARISON:  Radiographs 1 day prior, CT abdomen 2 days prior.  FINDINGS: Bibasilar atelectasis, minimal pleural thickening dependently.  Enteric tube in place, tip in the duodenum. There is fluid distending the stomach. Diffusely fluid-filled small bowel with small bowel dilatation, measuring up to 4.9 cm. No definite transition point, the distal small bowel loops are decompressed. There is diffuse colonic wall thickening with surrounding perienteric inflammatory change from the mid transverse colon to the rectum. The distal colon is fluid-filled. The more proximal colon is normal in caliber with small volume of stool. The appendix is normal.  No free air or pneumatosis. Small amount of free fluid in the perisplenic space and pelvis is likely reactive. The  mesenteric vessels appear patent.  No focal hepatic lesion. There is an elongated left hepatic lobe. The gallbladder is physiologically distended. The spleen and pancreas are unremarkable. Unchanged adrenal nodularity. Kidneys demonstrate symmetric enhancement and excretion. No hydronephrosis or perinephric stranding.  Abdominal aorta is normal in caliber, minimal atherosclerosis. No retroperitoneal adenopathy. Mild whole body wall edema.  Within the pelvis the urinary bladder is decompressed by Foley catheter. Uterus remains in situ, atrophic, normal for age. No adnexal mass.  There are no acute or suspicious osseous abnormalities. Remote lower left rib fractures.  IMPRESSION: 1. Colitis extending from the mid transverse colon through the rectum. Findings may be infectious or inflammatory. There is no perforation or abscess. 2. Dilated and fluid-filled small bowel loops in the central and lower abdomen. This may be reactive ileus versus small bowel enteritis. 3. Small amount of free fluid in the pelvis and perisplenic space.   Electronically Signed   By: Rubye OaksMelanie  Ehinger M.D.   On: 02/11/2015 21:53  Dg Chest Port 1 View  02/11/2015   CLINICAL DATA:  Central catheter placement  EXAM: PORTABLE CHEST - 1 VIEW  COMPARISON:  February 10, 2015  FINDINGS: Central catheter tip is at the cavoatrial junction. Nasogastric tube tip and side port are in the distal stomach. No pneumothorax. There is no edema or consolidation. Heart is upper normal in size with pulmonary vascularity within normal limits. No adenopathy.  IMPRESSION: Tube and catheter positions as described without pneumothorax. No edema or consolidation.   Electronically Signed   By: Bretta Bang III M.D.   On: 02/11/2015 17:33    ASSESSMENT AND PLAN:   1. Atrial flutter with RVR: Rate is still poorly controlled . BP is stable. She is currently on an amiodarone drip and a diltiazem drip. I will give her another bolus of Amio IV and increase Lopressor   IV q 4 hours. She was on Atenolol at home. No anti-coagulation currently with GI bleeding and ischemic colitis - per GI will 1-2 weeks until safe to restart anticoagulation.   2. HTN - BP elevated today which should improve on BB 3. GI bleed secondary to ischemic colitis.  GI recommends no anticoagulation for at least 1-2 weeks 4.  Hypokalemia - replete per TRH 5.  Mildly elevated troponin most likely demand ischemia in setting of afib with RVR - normal LVF on echo    Quintella Reichert, MD  02/13/2015  9:14 AM

## 2015-02-13 NOTE — Progress Notes (Signed)
Patient ID: Natasha Martin, female   DOB: Nov 28, 1960, 54 y.o.   MRN: [id number]    Progress Note   Subjective   Sleeping soundly- did not disturb   Objective   Vital signs in last 24 hours: Temp:  [97.9 F (36.6 C)-99.2 F (37.3 C)] 97.9 F (36.6 C) (04/15 0400) Pulse Rate:  [139-150] 146 (04/15 0600) Resp:  [20-23] 20 (04/15 0600) BP: (125-177)/(51-94) 150/63 mmHg (04/15 0600) SpO2:  [95 %-100 %] 96 % (04/15 0600) Last BM Date: 02/09/15 (per family) General:    white female in NAD NG draining dark brown Heart: irr Regular rate and rhythm; tachy no murmurs Lungs: Respirations even and unlabored, lungs CTA bilaterally Abdomen:  Softer, nontender, BS+, less distended  Extremities:  Without edema. Neurologic:  Psych:  Cooperative, sleeping  Intake/Output from previous day: 04/14 0701 - 04/15 0700 In: 3064.1 [I.V.:2364.1; IV Piggyback:700] Out: 4825 [Urine:3450; Emesis/NG output:1375] Intake/Output this shift:    Lab Results:  Recent Labs  02/11/15 1525 02/12/15 0438 02/13/15 0528  WBC 3.9* 4.0 3.9*  HGB 10.6* 10.2* 10.0*  HCT 31.0* 29.4* 28.3*  PLT 172 176 173   BMET  Recent Labs  02/11/15 1525 02/12/15 0438 02/13/15 0528  NA 137 140 135  K 3.6 2.9* 2.9*  CL 102 106 99  CO2 26 26 22   GLUCOSE 194* 199* 200*  BUN 15 11 10   CREATININE 0.66 0.69 0.70  CALCIUM 7.9* 7.7* 7.3*   LFT  Recent Labs  02/11/15 1830  PROT 5.5*  ALBUMIN 2.5*  AST 19  ALT 17  ALKPHOS 53  BILITOT 1.0  BILIDIR 0.2  IBILI 0.8   PT/INR No results for input(s): LABPROT, INR in the last 72 hours.  Studies/Results: Ct Head Wo Contrast  02/11/2015   CLINICAL DATA:  Altered mental status today.  EXAM: CT HEAD WITHOUT CONTRAST  TECHNIQUE: Contiguous axial images were obtained from the base of the skull through the vertex without intravenous contrast.  COMPARISON:  02/09/2015  FINDINGS: The ventricles are normal in size and configuration. No extra-axial fluid collections are  identified. The gray-white differentiation is normal. No CT findings for acute intracranial process such as hemorrhage or infarction. No mass lesions. The brainstem and cerebellum are grossly normal.  The bony structures are intact. The paranasal sinuses and mastoid air cells are clear. The globes are intact.  IMPRESSION: Normal head CT.  No change since 2 days ago.   Electronically Signed   By: Rudie MeyerP.  Gallerani M.D.   On: 02/11/2015 21:44   Ct Angio Chest Pe W/cm &/or Wo Cm  02/12/2015   CLINICAL DATA:  Tachycardia with elevated D-dimer.  EXAM: CT ANGIOGRAPHY CHEST WITH CONTRAST  TECHNIQUE: Multidetector CT imaging of the chest was performed using the standard protocol during bolus administration of intravenous contrast. Multiplanar CT image reconstructions and MIPs were obtained to evaluate the vascular anatomy.  CONTRAST:  100mL OMNIPAQUE IOHEXOL 350 MG/ML SOLN  COMPARISON:  Chest x-ray 02/11/2015 and CT abdomen 02/11/2015  FINDINGS: The lungs are adequately inflated with minimal linear scarring over the left lower lobe. There is a tiny amount of right pleural fluid and associated atelectasis. Airways are within normal.  There is mild prominence of the heart. There is a sub cm lymph node over the left pericardial phrenic angle. Pulmonary arterial system is normal without evidence of emboli. Right-sided aortic arch is present with aberrant left subclavian artery coursing posterior to the esophagus. Nasogastric tube is present. There is no mediastinal, hilar or  axillary adenopathy.  Images through the upper abdomen demonstrate minimal diffuse hepatic steatosis. Tiny amount of perihepatic fluid unchanged. Remainder of the exam is unchanged.  Review of the MIP images confirms the above findings.  IMPRESSION: No evidence pulmonary embolism.  Tiny amount of right pleural fluid minimal right basilar atelectasis. Linear scarring left base.  Mild cardiomegaly. Right-sided aortic arch with aberrant left subclavian artery.    Electronically Signed   By: Elberta Fortis M.D.   On: 02/12/2015 17:25   Ct Abdomen Pelvis W Contrast  02/11/2015   CLINICAL DATA:  Abdominal pain and distention, decreased bowel sounds.  EXAM: CT ABDOMEN AND PELVIS WITH CONTRAST  TECHNIQUE: Multidetector CT imaging of the abdomen and pelvis was performed using the standard protocol following bolus administration of intravenous contrast.  CONTRAST:  OMNIPAQUE IOHEXOL 300 MG/ML  SOLN  COMPARISON:  Radiographs 1 day prior, CT abdomen 2 days prior.  FINDINGS: Bibasilar atelectasis, minimal pleural thickening dependently.  Enteric tube in place, tip in the duodenum. There is fluid distending the stomach. Diffusely fluid-filled small bowel with small bowel dilatation, measuring up to 4.9 cm. No definite transition point, the distal small bowel loops are decompressed. There is diffuse colonic wall thickening with surrounding perienteric inflammatory change from the mid transverse colon to the rectum. The distal colon is fluid-filled. The more proximal colon is normal in caliber with small volume of stool. The appendix is normal.  No free air or pneumatosis. Small amount of free fluid in the perisplenic space and pelvis is likely reactive. The mesenteric vessels appear patent.  No focal hepatic lesion. There is an elongated left hepatic lobe. The gallbladder is physiologically distended. The spleen and pancreas are unremarkable. Unchanged adrenal nodularity. Kidneys demonstrate symmetric enhancement and excretion. No hydronephrosis or perinephric stranding.  Abdominal aorta is normal in caliber, minimal atherosclerosis. No retroperitoneal adenopathy. Mild whole body wall edema.  Within the pelvis the urinary bladder is decompressed by Foley catheter. Uterus remains in situ, atrophic, normal for age. No adnexal mass.  There are no acute or suspicious osseous abnormalities. Remote lower left rib fractures.  IMPRESSION: 1. Colitis extending from the mid transverse  colon through the rectum. Findings may be infectious or inflammatory. There is no perforation or abscess. 2. Dilated and fluid-filled small bowel loops in the central and lower abdomen. This may be reactive ileus versus small bowel enteritis. 3. Small amount of free fluid in the pelvis and perisplenic space.   Electronically Signed   By: Rubye Oaks M.D.   On: 02/11/2015 21:53   Dg Chest Port 1 View  02/11/2015   CLINICAL DATA:  Central catheter placement  EXAM: PORTABLE CHEST - 1 VIEW  COMPARISON:  February 10, 2015  FINDINGS: Central catheter tip is at the cavoatrial junction. Nasogastric tube tip and side port are in the distal stomach. No pneumothorax. There is no edema or consolidation. Heart is upper normal in size with pulmonary vascularity within normal limits. No adenopathy.  IMPRESSION: Tube and catheter positions as described without pneumothorax. No edema or consolidation.   Electronically Signed   By: Bretta Bang III M.D.   On: 02/11/2015 17:33      Assessment / Plan:   #1 54 yo female with probable acute segmental ischemic colitis and ileus- improving slowly- abdominal films pending this am- if improved. May be able to d/c NG tube and allow clears #2 anemia- no current active bleeding #3 Atrial fib/flutter-rate remains high #4 acute encephalopathy- resolved #5 schizophrenia/MR  Active Problems:   Atrial fibrillation with RVR   GI bleed   Abdominal distention   Noninfectious gastroenteritis and colitis   Diabetes mellitus type 2 in nonobese   Acute encephalopathy   Ileus   Acute ischemic colitis   Atrial flutter with rapid ventricular response     LOS: 2 days   Amy Esterwood  02/13/2015, 11:05 AM      Attending physician's note   I have taken an interval history, reviewed the chart and examined the patient. I agree with the Advanced Practitioner's note, impression and recommendations. Resolving ileus and presumed ischemic colitis. Repeat abd films today.  Aflutter with RVP still poorly controlled. To clarify GI recommendations on anticoagulation: Pt is at an increased risk of GI bleeding with anticoagulation, likely for 1-2 weeks, however if other services feel that the benefits outweigh the risks of anticoagulation could start with close monitoring. Would suggest heparin that will reverse quickly if bleeding occurs if decision is made to anticoagulate.   Venita Lick. Russella Dar, MD [facility]

## 2015-02-13 NOTE — Progress Notes (Signed)
CSW continuing to follow.  Pt admitted [facility] [place]   CSW [place] home manager[place] reported that pt is ambulatory at baseline at group home. CSW updated pt group home manager on pt status.   CSW contacted pt sister, [place]. CSW introduced self and explained role. Pt sister reports that pt has been at [facility] for 4-5 years. CSW discussed with pt sister that pt will have to be assess by therapy once she is more stable in order to determine disposition needs. CSW discussed with pt sister that depending on how pt progresses that pt may need higher level of care upon discharge. Pt sister expressed understanding and agreeable to what is recommended for continued care.   Per unit progression meeting, pt with a-fib RVR and on amiodarone and cardizone drip, pt currently has NG tube. Pt mild mental slowing at baseline and has hx of schizophrenia.   Pt will need PT/OT consult when stable to participate in order to adequately assess pt discharge needs.   CSW to continue to follow to provide support and assist with pt disposition needs.   Loletta SpecterSuzanna Kidd, MSW, LCSW Clinical Social Work [phone]

## 2015-02-14 LAB — GLUCOSE, CAPILLARY
GLUCOSE-CAPILLARY: 221 mg/dL — AB (ref 70–99)
Glucose-Capillary: 158 mg/dL — ABNORMAL HIGH (ref 70–99)
Glucose-Capillary: 145 mg/dL — ABNORMAL HIGH (ref 70–99)
Glucose-Capillary: 262 mg/dL — ABNORMAL HIGH (ref 70–99)

## 2015-02-14 LAB — HEMOGLOBIN AND HEMATOCRIT, BLOOD
HCT: 30.3 % — ABNORMAL LOW (ref 36.0–46.0)
HCT: 30.5 % — ABNORMAL LOW (ref 36.0–46.0)
HEMOGLOBIN: 10.4 g/dL — AB (ref 12.0–15.0)
HEMOGLOBIN: 10.6 g/dL — AB (ref 12.0–15.0)

## 2015-02-14 LAB — BASIC METABOLIC PANEL
Anion gap: 13 (ref 5–15)
BUN: 8 mg/dL (ref 6–23)
CO2: 25 mmol/L (ref 19–32)
Calcium: 7.4 mg/dL — ABNORMAL LOW (ref 8.4–10.5)
Chloride: 99 mmol/L (ref 96–112)
Creatinine, Ser: 0.61 mg/dL (ref 0.50–1.10)
GFR calc Af Amer: >90 mL/min (ref >90)
GFR calc non Af Amer: >90 mL/min (ref >90)
Glucose, Bld: 190 mg/dL — ABNORMAL HIGH (ref 70–99)
Potassium: 3.2 mmol/L — ABNORMAL LOW (ref 3.5–5.1)
Sodium: 137 mmol/L (ref 135–145)

## 2015-02-14 LAB — PHOSPHORUS: PHOSPHORUS: 1.8 mg/dL — AB (ref 2.3–4.6)

## 2015-02-14 LAB — MAGNESIUM: MAGNESIUM: 1.9 mg/dL (ref 1.5–2.5)

## 2015-02-14 MED ORDER — SODIUM PHOSPHATE 3 MMOLE/ML IV SOLN
30.0000 mmol | Freq: Once | INTRAVENOUS | Status: AC
  Administered 2015-02-14: 30 mmol via INTRAVENOUS
  Filled 2015-02-14: qty 10

## 2015-02-14 MED ORDER — SODIUM CHLORIDE 0.9 % IV SOLN
INTRAVENOUS | Status: DC
  Administered 2015-02-14 – 2015-02-15 (×2): via INTRAVENOUS

## 2015-02-14 MED ORDER — POTASSIUM CHLORIDE 10 MEQ/50ML IV SOLN
10.0000 meq | INTRAVENOUS | Status: AC
  Administered 2015-02-14 (×4): 10 meq via INTRAVENOUS
  Filled 2015-02-14 (×4): qty 50

## 2015-02-14 MED ORDER — METOPROLOL TARTRATE 1 MG/ML IV SOLN
7.5000 mg | INTRAVENOUS | Status: DC
  Administered 2015-02-14 – 2015-02-15 (×5): 7.5 mg via INTRAVENOUS
  Filled 2015-02-14 (×5): qty 10

## 2015-02-14 MED ORDER — M.V.I. ADULT IV INJ
INTRAVENOUS | Status: AC
  Administered 2015-02-14: 18:00:00 via INTRAVENOUS
  Filled 2015-02-14: qty 720

## 2015-02-14 MED ORDER — TRACE MINERALS CR-CU-F-FE-I-MN-MO-SE-ZN IV SOLN
INTRAVENOUS | Status: DC
  Filled 2015-02-14: qty 720

## 2015-02-14 MED ORDER — FAT EMULSION 20 % IV EMUL
240.0000 mL | INTRAVENOUS | Status: AC
  Administered 2015-02-14: 240 mL via INTRAVENOUS
  Filled 2015-02-14: qty 250

## 2015-02-14 MED ORDER — FAT EMULSION 20 % IV EMUL
250.0000 mL | INTRAVENOUS | Status: DC
  Filled 2015-02-14: qty 250

## 2015-02-14 NOTE — Progress Notes (Signed)
PARENTERAL NUTRITION CONSULT NOTE - INITIAL  Pharmacy Consult for TPN Indication: Prolonged ileus  No Known Allergies  Patient Measurements: Height:  (154.9 cm) Weight: 162 lb 11.2 oz (73.8 kg) IBW/kg (Calculated) : 47.8 Adjusted Body Weight: 55kg Usual Weight:   Vital Signs: Temp: 98.9 F (37.2 C) (04/16 0400) Temp Source: Oral (04/16 0400) BP: 142/76 mmHg (04/16 0600) Pulse Rate: 76 (04/16 0604) Intake/Output from previous day: 04/15 0701 - 04/16 0700 In: 3338.7 [P.O.:200; I.V.:2238.7; IV Piggyback:900] Out: 3550 [Urine:2750; Emesis/NG output:800] Intake/Output from this shift:    Labs:  Recent Labs  02/11/15 1525 02/12/15 0438 02/13/15 0528  WBC 3.9* 4.0 3.9*  HGB 10.6* 10.2* 10.0*  HCT 31.0* 29.4* 28.3*  PLT 172 176 173     Recent Labs  02/11/15 1525 02/11/15 1830 02/12/15 0438 02/13/15 0528 02/14/15 0545  NA 137  --  140 135 137  K 3.6  --  2.9* 2.9* 3.2*  CL 102  --  106 99 99  CO2 26  --  GLUCOSE 194*  --  199* 200* 190*  BUN 15  --  CREATININE 0.66  --  0.69 0.70 0.61  CALCIUM 7.9*  --  7.7* 7.3* 7.4*  MG  --   --   --  1.7  --   PHOS  --   --   --   --  1.8*  PROT 6.0 5.5*  --   --   --   ALBUMIN 2.6* 2.5*  --   --   --   AST 21 19  --   --   --   ALT 17 17  --   --   --   ALKPHOS 55 53  --   --   --   BILITOT 0.8 1.0  --   --   --   BILIDIR 0.2 0.2  --   --   --   IBILI 0.6 0.8  --   --   --    Estimated Creatinine Clearance: 75.6 mL/min (by C-G formula based on Cr of 0.61).    Recent Labs  02/13/15 1739 02/13/15 1946 02/14/15 0005  GLUCAP 172* 146* 221*    Medical History: Past Medical History  Diagnosis Date  . Diabetes   . Hyperlipidemia   . Schizophrenia   . Anxiety   . Hypertension   . Seizures     has not had a seizure since last year. Unknown orgin- no family available to come and patient does not know.  Marland Kitchen Dysphagia   . Mild mental slowing   . 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 Hospital stay complicated by new onset A. Fib with RVR and abdominal distention with colitis and [facility] for further management and treatment of her A. fib, abdominal distention and colitis, and AMS. Abd films 4/15 showed persistent dilated small bowel and right colon likely due to obstruction related to severe colitis. Pharmacy consulted to start TPN on 4/16 for prolonged ileus.   NG tube in place.    Significant events:   Today:    Glucose - Hx DM, CBGs elevated 146-221 (goal < 150)  Electrolytes -K persistently low, replaced by MD with 100mEq yesterday, only increased 2.9--> 3.2, Mg 1.7 yesterday, replaced by MD, Phos low today - replaced by MD  Renal - SCr WNL, no issues  LFTs -WNL  TGs -Will check tomorrow AM  Prealbumin -will check tomorrow AM  NUTRITIONAL GOALS                                                                                             RD recs: pending  Estimate daily caloric needs: 65 -82 g protein, 1375 - 1650 kcal, and 1.5-2L fluid   Clinimix E5/15 at a goal rate of 5260ml/hr + 20% fat emulsion at 4610ml/hr to provide: 72g/day protein, 1502Kcal/day.  PLAN                                                                                                                         At 1800 today:  Start Clinimix E5/15 at 4330ml/hr.  20% fat emulsion at 9810ml/hr  Insulin - pt to  receive 10 units/24 hours in TPN   Plan to advance as tolerated to the goal rate.  TPN to contain standard multivitamins and trace elements.  Reduce IVF to 6050ml/hr.  Continue SSI q6h  TPN lab panels on Mondays & Thursdays.  RD consult  F/u daily.  Haynes Hoehnolleen Jeanpaul Biehl, PharmD, BCPS 02/14/2015, 8:25 AM  Pager: [phone]

## 2015-02-14 NOTE — Progress Notes (Signed)
Patient Demographics  Natasha Croftsaula Martin, is a 54 y.o. female, DOB - 03/24/1961, WUJ:[id number]  Admit date - 02/11/2015   Admitting Physician Starleen Armsawood S Bernardo Brayman, MD  Outpatient Primary MD for the patient is PROVIDER NOT IN SYSTEM   LOS - 3   No chief complaint on file.     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 fib with RVR, abdominal distention with colitis, transferred to [facility] on 4/13, for further management and treatment of her A. fib, abdominal distention and colitis, and altered mental status.  Subjective:   Natasha CroftsPaula Hitzeman today has, No headache, No chest pain, No abdominal pain - No Nausea, asking if she can have breakfast .  Assessment & Plan    Active Problems:   Atrial fibrillation with RVR   GI bleed   Abdominal distention   Noninfectious gastroenteritis and colitis   Diabetes mellitus type 2 in nonobese   Acute encephalopathy   Ileus   Acute ischemic colitis   Atrial flutter with rapid ventricular response  Acute encephalopathy - Most likely metabolic in the setting of hyponatremia, intra-abdominal process, with possible polypharmacy as she is on multiple high-dose antipsychotic medication. - CT head without contrast 4/13 no acute findings. - Ammonia within normal limits - EEG finding consistent with hepatic encephalopathy. - resolved  at baseline per family.  Atrial fibrillation/atrial flutter - New onset - Poorly controlled, on Cardizem and amiodarone drip, IV Lopressor was increased by cardiology to 7.5 mg IV every 4 hours, no plans for cardioversion as not on  anticoagulation. - No anti-coagulation currently giving her GI bleeding ( especially in the setting of possible ischemic colitis which will take 1-2 weeks to recover her GI) - 2-D echo showing EF 60-65%.  Colitis - Most likely ischemic, unlikely C. difficile as no diarrhea(no bowel movement). - Continue with empiric IV antibiotic coverage Zosyn.   Ileus - No improvement of ileus on abdominal x-ray , continue to replace electrolytes, will be started on TPN. - Gastroenterology Input appreciated, will clamp NG tube, will try clear liquid diet.  Type 2 diabetes mellitus - on insulin sliding scale.    Hypokalemia/hypophosphatemia - Repleted, will recheck in a.m.Marland Kitchen.  Elevated d-dimer - She had d diameters of 14 [facility], CT chest angiogram negative for PE, lower extremity Dopplers negative for DVT.  Schizophrenia - Resumed on Seroquel, will resume back on Haldol when more stable.  Code Status: Full  Family Communication: None at bedside  Disposition Plan: Remains on step down   Procedures  None   Consults   Gen. Surgery [facility]   Medications  Scheduled Meds: . carbamazepine  400 mg Oral BID  . famotidine  20 mg Oral Daily  . heparin subcutaneous  5,000 Units Subcutaneous 3 times per day  . insulin aspart  0-9 Units Subcutaneous 6 times per day  . metoprolol  7.5 mg Intravenous Q4H while awake  . piperacillin-tazobactam (ZOSYN)  IV  3.375 g Intravenous Q8H  . potassium chloride  10 mEq Intravenous  Q1 Hr x 4  . potassium chloride  40 mEq Oral BID  . QUEtiapine  800 mg Oral QHS  . sodium phosphate  Dextrose 5% IVPB  30 mmol Intravenous Once   Continuous Infusions: . sodium chloride 75 mL/hr at 02/14/15 0300  . sodium chloride    . amiodarone 30 mg/hr (02/14/15 0700)  . diltiazem (CARDIZEM) infusion 10 mg/hr (02/14/15 1209)  . fat emulsion     And  . Marland KitchenTPN (CLINIMIX-E) Adult     PRN Meds:.albuterol, ALPRAZolam,  ondansetron **OR** ondansetron (ZOFRAN) IV  DVT Prophylaxis -SCDs , subcutaneous heparin  Lab Results  Component Value Date   PLT 173 02/13/2015    Antibiotics    Anti-infectives    Start     Dose/Rate Route Frequency Ordered Stop   02/11/15 1800  metroNIDAZOLE (FLAGYL) IVPB 500 mg  Status:  Discontinued     500 mg 100 mL/hr over 60 Minutes Intravenous 4 times per day 02/11/15 1638 02/14/15 1256   02/11/15 1800  piperacillin-tazobactam (ZOSYN) IVPB 3.375 g     3.375 g 12.5 mL/hr over 240 Minutes Intravenous Every 8 hours 02/11/15 1650            Objective:   Filed Vitals:   02/14/15 0900 02/14/15 1000 02/14/15 1100 02/14/15 1200  BP:  168/78  159/110  Pulse: 138 119 133 136  Temp:      TempSrc:      Resp: 23 25 28 18   Height:      Weight:      SpO2: 98% 98% 97% 97%    Wt Readings from Last 3 Encounters:  02/14/15 73.8 kg (162 lb 11.2 oz)  01/28/14 71.215 kg (157 lb)  01/13/14 70.035 kg (154 lb 6.4 oz)     Intake/Output Summary (Last 24 hours) at 02/14/15 1257 Last data filed at 02/14/15 1209  Gross per 24 hour  Intake 3510.4 ml  Output   4650 ml  Net -1139.6 ml     Physical Exam  Awake Alert, communicative, No new F.N deficits, Normal affect .AT,PERRAL Supple Neck,No JVD, No cervical lymphadenopathy appriciated.  Symmetrical Chest wall movement, Good air movement bilaterally, CTAB Irregular,No Gallops,Rubs or new Murmurs, No Parasternal Heave Diminished B.Sounds, Abd Soft, No tenderness, mildly distended ,No organomegaly appriciated, No rebound - guarding or rigidity. No Cyanosis, Clubbing or edema, No new Rash or bruise     Data Review   Micro Results Recent Results (from the past 240 hour(s))  MRSA PCR Screening     Status: None   Collection Time: 02/11/15  8:12 PM  Result Value Ref Range Status   MRSA by PCR NEGATIVE NEGATIVE Final    Comment:        The GeneXpert MRSA Assay (FDA approved for NASAL specimens only), is one component of  a comprehensive MRSA colonization surveillance program. It is not intended to diagnose MRSA infection nor to guide or monitor treatment for MRSA infections.     Radiology Reports Ct Angio Chest Pe W/cm &/or Wo Cm  02/12/2015   CLINICAL DATA:  Tachycardia with elevated D-dimer.  EXAM: CT ANGIOGRAPHY CHEST WITH CONTRAST  TECHNIQUE: Multidetector CT imaging of the chest was performed using the standard protocol during bolus administration of intravenous contrast. Multiplanar CT image reconstructions and MIPs were obtained to evaluate the vascular anatomy.  CONTRAST:  OMNIPAQUE IOHEXOL 350 MG/ML SOLN  COMPARISON:  Chest x-ray 02/11/2015 and CT abdomen 02/11/2015  FINDINGS: The lungs are adequately inflated with minimal linear scarring  over the left lower lobe. There is a tiny amount of right pleural fluid and associated atelectasis. Airways are within normal.  There is mild prominence of the heart. There is a sub cm lymph node over the left pericardial phrenic angle. Pulmonary arterial system is normal without evidence of emboli. Right-sided aortic arch is present with aberrant left subclavian artery coursing posterior to the esophagus. Nasogastric tube is present. There is no mediastinal, hilar or axillary adenopathy.  Images through the upper abdomen demonstrate minimal diffuse hepatic steatosis. Tiny amount of perihepatic fluid unchanged. Remainder of the exam is unchanged.  Review of the MIP images confirms the above findings.  IMPRESSION: No evidence pulmonary embolism.  Tiny amount of right pleural fluid minimal right basilar atelectasis. Linear scarring left base.  Mild cardiomegaly. Right-sided aortic arch with aberrant left subclavian artery.   Electronically Signed   By: Elberta Fortis M.D.   On: 02/12/2015 17:25   Dg Abd Acute W/chest  02/13/2015   CLINICAL DATA:  Altered mental status. Abdominal distention. Colitis.  EXAM: DG ABDOMEN ACUTE W/ 1V CHEST  COMPARISON:  CT examinations 414 and  02/11/2015  FINDINGS: Upright chest x-ray: The right IJ central venous catheter tip is in distal SVC near the cavoatrial junction. This is stable. The heart is normal in size. Low lung volumes with vascular crowding and bibasilar atelectasis. The NG tube is stable.  Two views of the abdomen demonstrate diffusely distended small bowel loops and right colon. Suspect functional obstruction due to severe colitis beginning in the mid transverse colon  IMPRESSION: Persistent dilated small bowel and right colon likely due to a functional obstruction related to severe colitis beginning in the mid transverse colon. No definite free air.  Stable bibasilar atelectasis.   Electronically Signed   By: Rudie Meyer M.D.   On: 02/13/2015 15:33    CBC  Recent Labs Lab 02/11/15 1525 02/12/15 0438 02/13/15 0528 02/14/15 1137  WBC 3.9* 4.0 3.9*  --   HGB 10.6* 10.2* 10.0* 10.4*  HCT 31.0* 29.4* 28.3* 30.3*  PLT 172 176 173  --   MCV 91.7 90.7 91.0  --   MCH 31.4 31.5 32.2  --   MCHC 34.2 34.7 35.3  --   RDW 12.8 12.8 13.1  --     Chemistries   Recent Labs Lab 02/11/15 1525 02/11/15 1830 02/12/15 0438 02/13/15 0528 02/14/15 0545  NA 137  --  140 135 137  K 3.6  --  2.9* 2.9* 3.2*  CL 102  --  106 99 99  CO2 26  --  GLUCOSE 194*  --  199* 200* 190*  BUN 15  --  CREATININE 0.66  --  0.69 0.70 0.61  CALCIUM 7.9*  --  7.7* 7.3* 7.4*  MG  --   --   --  1.7 1.9  AST 21 19  --   --   --   ALT 17 17  --   --   --   ALKPHOS 55 53  --   --   --   BILITOT 0.8 1.0  --   --   --    ------------------------------------------------------------------------------------------------------------------ estimated creatinine clearance is 75.6 mL/min (by C-G formula based on Cr of 0.61). ------------------------------------------------------------------------------------------------------------------ No results for input(s): HGBA1C in the last 72  hours. ------------------------------------------------------------------------------------------------------------------ No results for input(s): CHOL, HDL, LDLCALC, TRIG, CHOLHDL, LDLDIRECT in the last 72 hours. ------------------------------------------------------------------------------------------------------------------  Recent Labs  02/11/15 1830  TSH 1.154   ------------------------------------------------------------------------------------------------------------------ No results for input(s): VITAMINB12, FOLATE, FERRITIN, TIBC, IRON, RETICCTPCT in the last 72 hours.  Coagulation profile No results for input(s): INR, PROTIME in the last 168 hours.  No results for input(s): DDIMER in the last 72 hours.  Cardiac Enzymes  Recent Labs Lab 02/11/15 1830 02/11/15 2255 02/12/15 0438  CKMB 11.4*  --   --   TROPONINI 0.04* 0.04* <0.03   ------------------------------------------------------------------------------------------------------------------ Invalid input(s): POCBNP     Time Spent in minutes   35 minutes   Kaili Castille M.D on 02/14/2015 at 12:57 PM  Between 7am to 7pm - Pager - [phone]  After 7pm go to [email or web address] - password [email or web address]  And look for the night coverage person covering for me after hours  [facility] Office  [phone]   **Disclaimer: This note may have been dictated with voice recognition software. Similar sounding words can inadvertently be transcribed and this note may contain transcription errors which may not have been corrected upon publication of note.**

## 2015-02-14 NOTE — Progress Notes (Signed)
Patient ID: Natasha Martin, female   DOB: 1961/07/20, 54 y.o.   MRN: [id number]    Progress Note   Subjective  In good spirits -asking repeatedly for gingerale- Denies any nausea or abdominal pain. No stools per nursing Abdominal films yesterday showed persistent small bowel ileus   Objective   Vital signs in last 24 hours: Temp:  [97.4 F (36.3 C)-98.9 F (37.2 C)] 98.8 F (37.1 C) (04/16 0800) Pulse Rate:  [30-146] 76 (04/16 0604) Resp:  [15-26] 24 (04/16 0604) BP: (102-184)/(39-108) 142/76 mmHg (04/16 0600) SpO2:  [95 %-100 %] 97 % (04/16 0604) Weight:  [162 lb 11.2 oz (73.8 kg)] 162 lb 11.2 oz (73.8 kg) (04/16 0400) Last BM Date: 02/09/15 (per family) General:    white female in NAD Heart:  irRegular rate and rhythm; no murmurs Lungs: Respirations even and unlabored, lungs CTA bilaterally Abdomen:  Soft,much less distended, BS+ Extremities:  Without edema. Neurologic:  Alert and oriented,  grossly normal neurologically. Psych:  Cooperative. Normal mood and affect.  Intake/Output from previous day: 04/15 0701 - 04/16 0700 In: 3338.7 [P.O.:200; I.V.:2238.7; IV Piggyback:900] Out: 4650 [Urine:3350; Emesis/NG output:1300] Intake/Output this shift:    Lab Results:  Recent Labs  02/11/15 1525 02/12/15 0438 02/13/15 0528  WBC 3.9* 4.0 3.9*  HGB 10.6* 10.2* 10.0*  HCT 31.0* 29.4* 28.3*  PLT 172 176 173   BMET  Recent Labs  02/12/15 0438 02/13/15 0528 02/14/15 0545  NA 140 135 137  K 2.9* 2.9* 3.2*  CL 106 99 99  CO2 26 22 25   GLUCOSE 199* 200* 190*  BUN 11 10 8   CREATININE 0.69 0.70 0.61  CALCIUM 7.7* 7.3* 7.4*   LFT  Recent Labs  02/11/15 1830  PROT 5.5*  ALBUMIN 2.5*  AST 19  ALT 17  ALKPHOS 53  BILITOT 1.0  BILIDIR 0.2  IBILI 0.8   PT/INR No results for input(s): LABPROT, INR in the last 72 hours.  Studies/Results: Ct Angio Chest Pe W/cm &/or Wo Cm  02/12/2015   CLINICAL DATA:  Tachycardia with elevated D-dimer.  EXAM: CT ANGIOGRAPHY  CHEST WITH CONTRAST  TECHNIQUE: Multidetector CT imaging of the chest was performed using the standard protocol during bolus administration of intravenous contrast. Multiplanar CT image reconstructions and MIPs were obtained to evaluate the vascular anatomy.  CONTRAST:  100mL OMNIPAQUE IOHEXOL 350 MG/ML SOLN  COMPARISON:  Chest x-ray 02/11/2015 and CT abdomen 02/11/2015  FINDINGS: The lungs are adequately inflated with minimal linear scarring over the left lower lobe. There is a tiny amount of right pleural fluid and associated atelectasis. Airways are within normal.  There is mild prominence of the heart. There is a sub cm lymph node over the left pericardial phrenic angle. Pulmonary arterial system is normal without evidence of emboli. Right-sided aortic arch is present with aberrant left subclavian artery coursing posterior to the esophagus. Nasogastric tube is present. There is no mediastinal, hilar or axillary adenopathy.  Images through the upper abdomen demonstrate minimal diffuse hepatic steatosis. Tiny amount of perihepatic fluid unchanged. Remainder of the exam is unchanged.  Review of the MIP images confirms the above findings.  IMPRESSION: No evidence pulmonary embolism.  Tiny amount of right pleural fluid minimal right basilar atelectasis. Linear scarring left base.  Mild cardiomegaly. Right-sided aortic arch with aberrant left subclavian artery.   Electronically Signed   By: Elberta Fortisaniel  Boyle M.D.   On: 02/12/2015 17:25   Dg Abd Acute W/chest  02/13/2015   CLINICAL DATA:  Altered  mental status. Abdominal distention. Colitis.  EXAM: DG ABDOMEN ACUTE W/ 1V CHEST  COMPARISON:  CT examinations 414 and 02/11/2015  FINDINGS: Upright chest x-ray: The right IJ central venous catheter tip is in distal SVC near the cavoatrial junction. This is stable. The heart is normal in size. Low lung volumes with vascular crowding and bibasilar atelectasis. The NG tube is stable.  Two views of the abdomen demonstrate  diffusely distended small bowel loops and right colon. Suspect functional obstruction due to severe colitis beginning in the mid transverse colon  IMPRESSION: Persistent dilated small bowel and right colon likely due to a functional obstruction related to severe colitis beginning in the mid transverse colon. No definite free air.  Stable bibasilar atelectasis.   Electronically Signed   By: Rudie Meyer M.D.   On: 02/13/2015 15:33       Assessment / Plan:   #1 54 yo female with severe left sided colitis acute-most consistent with acute segmental ischemic colitis , no active bleeding On empiric Flagyl-cdiff negative- can d/c #2 Ileus secondary to severe colitis- clinically resolving- will clamp NG, get pt up to chair, allow clear liquids  #3 atrial flutter #4 encephalopathy- resolved   Active Problems:   Atrial fibrillation with RVR   GI bleed   Abdominal distention   Noninfectious gastroenteritis and colitis   Diabetes mellitus type 2 in nonobese   Acute encephalopathy   Ileus   Acute ischemic colitis   Atrial flutter with rapid ventricular response     LOS: 3 days   Amy Esterwood  02/14/2015, 9:25 AM     Attending physician's note   I have taken an interval history, reviewed the chart and examined the patient. I agree with the Advanced Practitioner's note, impression and recommendations. Left sided colitis c/w ischemic colitis. Resolving ileus. Clamp NGt and try clear liquids today.  Venita Lick. Russella Dar, MD [facility]

## 2015-02-14 NOTE — Progress Notes (Signed)
Consulting cardiologist: Dr. Verne Carrowhristopher McAlhany  Seen for followup: Atrial flutter  Subjective:    Patient wants ginger ale, states that she is hungry. NG tube in place with bowel rest currently. She does not feel any palpitations or chest pain.  Objective:   Temp:  [97.4 F (36.3 C)-98.9 F (37.2 C)] 98.9 F (37.2 C) (04/16 0400) Pulse Rate:  [30-146] 76 (04/16 0604) Resp:  [15-26] 24 (04/16 0604) BP: (102-184)/(39-108) 142/76 mmHg (04/16 0600) SpO2:  [95 %-100 %] 97 % (04/16 0604) Weight:  [162 lb 11.2 oz (73.8 kg)] 162 lb 11.2 oz (73.8 kg) (04/16 0400) Last BM Date: 02/09/15 (per family)  Filed Weights   02/11/15 1400 02/12/15 0400 02/14/15 0400  Weight: 167 lb 5.3 oz (75.9 kg) 167 lb 5.3 oz (75.9 kg) 162 lb 11.2 oz (73.8 kg)    Intake/Output Summary (Last 24 hours) at 02/14/15 0756 Last data filed at 02/14/15 0700  Gross per 24 hour  Intake 3338.7 ml  Output   3550 ml  Net -211.3 ml    Telemetry: Persistent atrial flutter with RVR.  Exam:  General: Overweight woman, no distress.  HEENT: NG tube in place.  Lungs: Clear, nonlabored.  Cardiac: Rapid, irregular, no gallop.  Abdomen: Decreased bowel sounds.  Extremities: No pitting.  Lab Results:  Basic Metabolic Panel:  Recent Labs Lab 02/12/15 0438 02/13/15 0528 02/14/15 0545  NA 140 135 137  K 2.9* 2.9* 3.2*  CL 106 99 99  CO2 26 22 25   GLUCOSE 199* 200* 190*  BUN 11 10 8   CREATININE 0.69 0.70 0.61  CALCIUM 7.7* 7.3* 7.4*  MG  --  1.7  --     Liver Function Tests:  Recent Labs Lab 02/11/15 1525 02/11/15 1830  AST 21 19  ALT 17 17  ALKPHOS 55 53  BILITOT 0.8 1.0  PROT 6.0 5.5*  ALBUMIN 2.6* 2.5*    CBC:  Recent Labs Lab 02/11/15 1525 02/12/15 0438 02/13/15 0528  WBC 3.9* 4.0 3.9*  HGB 10.6* 10.2* 10.0*  HCT 31.0* 29.4* 28.3*  MCV 91.7 90.7 91.0  PLT 172 176 173    Cardiac Enzymes:  Recent Labs Lab 02/11/15 1830 02/11/15 2255 02/12/15 0438  CKTOTAL 41  --    --   CKMB 11.4*  --   --   TROPONINI 0.04* 0.04* <0.03    Echocardiogram 02/11/25: Study Conclusions  - Left ventricle: The cavity size was normal. Wall thickness was increased in a pattern of mild LVH. Systolic function was normal. The estimated ejection fraction was in the range of 60% to 65%. - Aortic valve: There was trivial regurgitation. Valve area (VTI): 2.94 cm^2. Valve area (Vmax): 2.77 cm^2.   Medications:   Scheduled Medications: . carbamazepine  400 mg Oral BID  . famotidine  20 mg Oral Daily  . heparin subcutaneous  5,000 Units Subcutaneous 3 times per day  . insulin aspart  0-9 Units Subcutaneous 6 times per day  . metoprolol  5 mg Intravenous Q4H  . metronidazole  500 mg Intravenous 4 times per day  . piperacillin-tazobactam (ZOSYN)  IV  3.375 g Intravenous Q8H  . potassium chloride  10 mEq Intravenous Q1 Hr x 4  . potassium chloride  40 mEq Oral BID  . QUEtiapine  800 mg Oral QHS  . sodium phosphate  Dextrose 5% IVPB  30 mmol Intravenous Once     Infusions: . sodium chloride 75 mL/hr at 02/14/15 0300  . amiodarone 30 mg/hr (02/14/15  0700)  . diltiazem (CARDIZEM) infusion 5 mg/hr (02/14/15 0700)     PRN Medications:  albuterol, ALPRAZolam, ondansetron **OR** ondansetron (ZOFRAN) IV   Assessment:   1. Newly documented atrial flutter with RVR, duration uncertain. She is presently on amiodarone and diltiazem infusions as well as IV Lopressor on a standing basis. Currently not anticoagulated with GI bleeding and suspected ischemic colitis. Therefore no plans to electrically cardiovert. Discussed with nursing, Cardizem infusion was cut back due to somewhat soft blood pressure. Heart rate comes down with IV Lopressor however, but does not persist. CHADSVASC score is 3.  2. Colitis, infectious versus ischemic. She is being managed by gastroenterology.  3. Ileus with NG tube in place.  4. History of GI bleeding.  5. Encephalopathy on baseline history  of schizophrenia and developmental mental delays.   Plan/Discussion:    Continue IV amiodarone and Cardizem, increase Lopressor to 7.5 mg IV every 4 hours. No anticoagulation at this point, and therefore no plans to electrically cardiovert. Replete electrolytes as needed.   Jonelle Sidle, M.D., F.A.C.C.

## 2015-02-15 LAB — CBC
HCT: 28 % — ABNORMAL LOW (ref 36.0–46.0)
HEMOGLOBIN: 9.8 g/dL — AB (ref 12.0–15.0)
MCH: 32.1 pg (ref 26.0–34.0)
MCHC: 35 g/dL (ref 30.0–36.0)
MCV: 91.8 fL (ref 78.0–100.0)
Platelets: 197 10*3/uL (ref 150–400)
RBC: 3.05 MIL/uL — ABNORMAL LOW (ref 3.87–5.11)
RDW: 13.4 % (ref 11.5–15.5)
WBC: 5.9 10*3/uL (ref 4.0–10.5)

## 2015-02-15 LAB — COMPREHENSIVE METABOLIC PANEL
ALT: 10 U/L (ref 0–35)
AST: 14 U/L (ref 0–37)
Albumin: 2.4 g/dL — ABNORMAL LOW (ref 3.5–5.2)
Alkaline Phosphatase: 50 U/L (ref 39–117)
Anion gap: 6 (ref 5–15)
BILIRUBIN TOTAL: 0.5 mg/dL (ref 0.3–1.2)
BUN: 8 mg/dL (ref 6–23)
CALCIUM: 7.5 mg/dL — AB (ref 8.4–10.5)
CO2: 26 mmol/L (ref 19–32)
CREATININE: 0.55 mg/dL (ref 0.50–1.10)
Chloride: 100 mmol/L (ref 96–112)
GFR calc non Af Amer: >90 mL/min (ref >90)
GLUCOSE: 273 mg/dL — AB (ref 70–99)
POTASSIUM: 3.8 mmol/L (ref 3.5–5.1)
Sodium: 132 mmol/L — ABNORMAL LOW (ref 135–145)
Total Protein: 5 g/dL — ABNORMAL LOW (ref 6.0–8.3)

## 2015-02-15 LAB — DIFFERENTIAL
BASOS PCT: 1 % (ref 0–1)
Basophils Absolute: 0.1 10*3/uL (ref 0.0–0.1)
Eosinophils Absolute: 0.1 10*3/uL (ref 0.0–0.7)
Eosinophils Relative: 2 % (ref 0–5)
Lymphocytes Relative: 23 % (ref 12–46)
Lymphs Abs: 1.4 10*3/uL (ref 0.7–4.0)
Monocytes Absolute: 0.6 10*3/uL (ref 0.1–1.0)
Monocytes Relative: 10 % (ref 3–12)
NEUTROS ABS: 3.7 10*3/uL (ref 1.7–7.7)
Neutrophils Relative %: 64 % (ref 43–77)
WBC Morphology: INCREASED

## 2015-02-15 LAB — GLUCOSE, CAPILLARY
GLUCOSE-CAPILLARY: 251 mg/dL — AB (ref 70–99)
GLUCOSE-CAPILLARY: 284 mg/dL — AB (ref 70–99)
GLUCOSE-CAPILLARY: 298 mg/dL — AB (ref 70–99)
Glucose-Capillary: 175 mg/dL — ABNORMAL HIGH (ref 70–99)
Glucose-Capillary: 243 mg/dL — ABNORMAL HIGH (ref 70–99)
Glucose-Capillary: 251 mg/dL — ABNORMAL HIGH (ref 70–99)
Glucose-Capillary: 300 mg/dL — ABNORMAL HIGH (ref 70–99)

## 2015-02-15 LAB — MAGNESIUM: MAGNESIUM: 1.6 mg/dL (ref 1.5–2.5)

## 2015-02-15 LAB — TRIGLYCERIDES: Triglycerides: 304 mg/dL — ABNORMAL HIGH (ref <150)

## 2015-02-15 LAB — PHOSPHORUS: PHOSPHORUS: 2 mg/dL — AB (ref 2.3–4.6)

## 2015-02-15 LAB — HEMOGLOBIN AND HEMATOCRIT, BLOOD
HCT: 28.1 % — ABNORMAL LOW (ref 36.0–46.0)
HCT: 29.9 % — ABNORMAL LOW (ref 36.0–46.0)
HEMOGLOBIN: 10.3 g/dL — AB (ref 12.0–15.0)
Hemoglobin: 9.8 g/dL — ABNORMAL LOW (ref 12.0–15.0)

## 2015-02-15 MED ORDER — LIP MEDEX EX OINT
TOPICAL_OINTMENT | CUTANEOUS | Status: AC
  Administered 2015-02-15: 11:00:00
  Filled 2015-02-15: qty 7

## 2015-02-15 MED ORDER — HALOPERIDOL 5 MG PO TABS
10.0000 mg | ORAL_TABLET | Freq: Two times a day (BID) | ORAL | Status: DC
  Administered 2015-02-15 – 2015-02-20 (×10): 10 mg via ORAL
  Filled 2015-02-15 (×17): qty 2

## 2015-02-15 MED ORDER — TRAZODONE HCL 50 MG PO TABS
50.0000 mg | ORAL_TABLET | Freq: Every day | ORAL | Status: DC
  Administered 2015-02-15 – 2015-02-19 (×5): 50 mg via ORAL
  Filled 2015-02-15 (×6): qty 1

## 2015-02-15 MED ORDER — METOPROLOL TARTRATE 25 MG PO TABS
25.0000 mg | ORAL_TABLET | Freq: Four times a day (QID) | ORAL | Status: DC
  Administered 2015-02-15 – 2015-02-17 (×12): 25 mg via ORAL
  Filled 2015-02-15 (×13): qty 1

## 2015-02-15 MED ORDER — MAGNESIUM SULFATE 2 GM/50ML IV SOLN
2.0000 g | Freq: Once | INTRAVENOUS | Status: AC
  Administered 2015-02-15: 2 g via INTRAVENOUS
  Filled 2015-02-15: qty 50

## 2015-02-15 MED ORDER — SODIUM PHOSPHATE 3 MMOLE/ML IV SOLN
30.0000 mmol | Freq: Once | INTRAVENOUS | Status: AC
  Administered 2015-02-15: 30 mmol via INTRAVENOUS
  Filled 2015-02-15: qty 10

## 2015-02-15 MED ORDER — GLUCERNA SHAKE PO LIQD
237.0000 mL | Freq: Two times a day (BID) | ORAL | Status: DC
  Administered 2015-02-15 – 2015-02-20 (×7): 237 mL via ORAL
  Filled 2015-02-15 (×11): qty 237

## 2015-02-15 MED ORDER — POTASSIUM CHLORIDE CRYS ER 20 MEQ PO TBCR
40.0000 meq | EXTENDED_RELEASE_TABLET | Freq: Two times a day (BID) | ORAL | Status: DC
  Administered 2015-02-15 – 2015-02-16 (×3): 40 meq via ORAL
  Filled 2015-02-15 (×3): qty 2

## 2015-02-15 NOTE — Progress Notes (Signed)
INITIAL NUTRITION ASSESSMENT  DOCUMENTATION CODES Per approved criteria  -Obesity Unspecified   INTERVENTION: TPN per pharmacy Provide Glucerna Shake po BID, each supplement provides 220 kcal and 10 grams of protein Encourage PO intake RD to continue to monitor  NUTRITION DIAGNOSIS: Inadequate oral intake related to N/V as evidenced by full liquid diet.   Goal: Pt to meet >/= 90% of their estimated nutrition needs   Monitor:  PO and supplemental intake, weight, labs, I/O's  Reason for Assessment: Consulted for new TPN  Admitting Dx: A. 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 fib with RVR, abdominal distention with colitis, [facility] on 4/[facility], for further management and treatment of her A. fib, abdominal distention and colitis, and altered mental status.  Per RN, pt is to finish her last bag of TPN and she will receive no more. Pt is eating full liquids well.  Pt states she is feeling hungry and she is tolerating liquids. PTA she states that she wasn't eating well x 1 day d/t vomiting. Pt would like to receive a nutritional supplement while on full liquid diet. RD to order Glucerna shakes d/t elevated CBGs. Pt's weight has increased over the past month.  Nutrition focused physical exam shows no sign of depletion of muscle mass or body fat. Pt with +1 RLE & +1 LLE edema.  Labs reviewed: Low Na & Phos CBGs: 145-262  Height: Ht Readings from Last 1 Encounters:  02/11/15 5\' 1"  (1.549 m)    Weight: Wt Readings from Last 1 Encounters:  02/15/15 162 lb 14.7 oz (73.9 kg)    Ideal Body Weight: 105 lb  % Ideal Body Weight: 154 %  Wt Readings from Last 10 Encounters:  02/15/15 162 lb 14.7 oz (73.9 kg)  01/28/14 157  lb (71.215 kg)  01/13/14 154 lb 6.4 oz (70.035 kg)    BMI:  Body mass index is 30.8 kg/(m^2).  Estimated Nutritional Needs: Kcal: 1400-1600 Protein: 60-70g Fluid: 1.5L/day  Skin: intact  Diet Order: TPN (CLINIMIX-E) Adult Diet full liquid Room service appropriate?: Yes; Fluid consistency:: Thin  EDUCATION NEEDS: -No education needs identified at this time   Intake/Output Summary (Last 24 hours) at 02/15/15 1142 Last data filed at 02/15/15 1100  Gross per 24 hour  Intake 3006.9 ml  Output   4665 ml  Net -1658.1 ml    Last BM: 4/16  Labs:   Recent Labs Lab 02/13/15 0528 02/14/15 0545 02/15/15 0420  NA 135 137 132*  K 2.9* 3.2* 3.8  CL 99 99 100  CO2 22 25 26   BUN 10 8 8   CREATININE 0.70 0.61 0.55  CALCIUM 7.3* 7.4* 7.5*  MG 1.7 1.9 1.6  PHOS  --  1.8* 2.0*  GLUCOSE 200* 190* 273*    CBG (last 3)   Recent Labs  02/14/15 1242 02/14/15 1629 02/15/15 0431  GLUCAP 145* 262* 243*    Scheduled Meds: . carbamazepine  400 mg Oral BID  . famotidine  20 mg Oral Daily  . heparin subcutaneous  5,000 Units Subcutaneous 3 times per day  . insulin aspart  0-9 Units Subcutaneous 6 times per day  . lip balm      . metoprolol tartrate  25 mg Oral QID  . piperacillin-tazobactam (ZOSYN)  IV  3.375 g  Intravenous Q8H  . potassium chloride  40 mEq Oral BID  . QUEtiapine  800 mg Oral QHS  . sodium phosphate  Dextrose 5% IVPB  30 mmol Intravenous Once    Continuous Infusions: . sodium chloride 50 mL/hr at 02/14/15 1809  . amiodarone 30 mg/hr (02/15/15 1100)  . diltiazem (CARDIZEM) infusion 15 mg/hr (02/15/15 1100)  . fat emulsion 240 mL (02/14/15 1731)   And  . Marland KitchenTPN (CLINIMIX-E) Adult 30 mL/hr at 02/14/15 1732    Past Medical History  Diagnosis Date  . Diabetes   . Hyperlipidemia   . Schizophrenia   . Anxiety   . Hypertension   . Seizures     has not had a seizure since last year. Unknown orgin- no family available to come and patient does not know.  Marland Kitchen  Dysphagia   . Mild mental slowing   . Poor historian     Past Surgical History  Procedure Laterality Date  . 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 Colonoscopy with propofol N/A 01/28/2014    Procedure: COLONOSCOPY WITH PROPOFOL (at cecum at 0749 total withdrawal time=16minutes);  Surgeon: West Bali, MD;  Location: [facility];  Service: Endoscopy;  Laterality: N/A;  . Esophagogastroduodenoscopy (egd) with propofol N/A 01/28/2014    Procedure: ESOPHAGOGASTRODUODENOSCOPY (EGD) WITH PROPOFOL;  Surgeon: West Bali, MD;  Location: [facility];  Service: Endoscopy;  Laterality: N/A;  . Savory dilation N/A 01/28/2014    Procedure: SAVORY DILATION  (12.78mm to 16mm);  Surgeon: West Bali, MD;  Location: [facility];  Service: Endoscopy;  Laterality: N/A;  . Polypectomy  01/28/2014    Procedure: POLYPECTOMY (Rectal);  Surgeon: West Bali, MD;  Location: [facility];  Service: Endoscopy;;  . Esophageal biopsy  01/28/2014    Procedure: BIOPSY (Gastric);  Surgeon: West Bali, MD;  Location: [facility];  Service: Endoscopy;;    Tilda Franco, MS, RD, LDN Pager: [phone] After Hours Pager: [phone]

## 2015-02-15 NOTE — Progress Notes (Signed)
Patient ID: Natasha Martin, female   DOB: [birth year], 54 y.o.   MRN: [id number]    Progress Note   Subjective  Talkative, in good spiirits  Wants to get up and walk. Has tolerated NG clamped and taking clears Had 3 BM's yesterday -first with blood   Objective   Vital signs in last 24 hours: Temp:  [98 F (36.7 C)-99 F (37.2 C)] 98.6 F (37 C) (04/17 0400) Pulse Rate:  [58-138] 87 (04/17 0600) Resp:  [12-29] 12 (04/17 0600) BP: (93-170)/(48-125) 113/69 mmHg (04/17 0412) SpO2:  [96 %-100 %] 97 % (04/17 0600) Weight:  [162 lb 14.7 oz (73.9 kg)] 162 lb 14.7 oz (73.9 kg) (04/17 0527) Last BM Date: 02/14/15 General:    white female in NAD Heart:  irRegular rate and rhythm;tachy no murmurs Lungs: Respirations even and unlabored, lungs CTA bilaterally Abdomen:  Soft, minimally distended. Normal bowel sounds, nontender. Extremities:  Without edema. Neurologic:  Alert and oriented,  grossly normal neurologically. Psych:  Cooperative. Normal mood and affect.  Intake/Output from previous day: 04/16 0701 - 04/17 0700 In: 2982.9 [P.O.:675; I.V.:1249.1; IV Piggyback:560; TPN:498.8] Out: 4550 [Urine:4550] Intake/Output this shift: Total I/O In: 100 [I.V.:50; IV Piggyback:50] Out: 415 [Urine:415]  Lab Results:  Recent Labs  02/13/15 0528  02/14/15 2000 02/15/15 0305 02/15/15 0420  WBC 3.9*  --   --   --  5.9  HGB 10.0*  < > 10.6* 9.8* 9.8*  HCT 28.3*  < > 30.5* 28.1* 28.0*  PLT 173  --   --   --  197  < > = values in this interval not displayed. BMET  Recent Labs  02/13/15 0528 02/14/15 0545 02/15/15 0420  NA 135 137 132*  K 2.9* 3.2* 3.8  CL 99 99 100  CO2 22 25 26   GLUCOSE 200* 190* 273*  BUN 10 8 8   CREATININE 0.70 0.61 0.55  CALCIUM 7.3* 7.4* 7.5*   LFT  Recent Labs  02/15/15 0420  PROT 5.0*  ALBUMIN 2.4*  AST 14  ALT 10  ALKPHOS 50  BILITOT 0.5   PT/INR No results for input(s): LABPROT, INR in the last 72 hours.  Studies/Results: Dg Abd Acute  W/chest  02/13/2015   CLINICAL DATA:  Altered mental status. Abdominal distention. Colitis.  EXAM: DG ABDOMEN ACUTE W/ 1V CHEST  COMPARISON:  CT examinations 414 and 02/11/2015  FINDINGS: Upright chest x-ray: The right IJ central venous catheter tip is in distal SVC near the cavoatrial junction. This is stable. The heart is normal in size. Low lung volumes with vascular crowding and bibasilar atelectasis. The NG tube is stable.  Two views of the abdomen demonstrate diffusely distended small bowel loops and right colon. Suspect functional obstruction due to severe colitis beginning in the mid transverse colon  IMPRESSION: Persistent dilated small bowel and right colon likely due to a functional obstruction related to severe colitis beginning in the mid transverse colon. No definite free air.  Stable bibasilar atelectasis.   Electronically Signed   By: Rudie MeyerP.  Gallerani M.D.   On: 02/13/2015 15:33       Assessment / Plan:   #1 54 yo female with acute severe left sided colitis -most likely ischemic with associated ileus- she is improving daily-  Will D/C NG tube today, advance to full liquids Had some bleeding with first BM's yesterday- not unexpected #2 persistent atrial flutter with RVR #3 AODM #4 schizophrenia/MR- at her baseline- TME resolved  Active Problems:   Atrial fibrillation  with RVR   GI bleed   Abdominal distention   Noninfectious gastroenteritis and colitis   Diabetes mellitus type 2 in nonobese   Acute encephalopathy   Ileus   Acute ischemic colitis   Atrial flutter with rapid ventricular response     LOS: 4 days   Amy Esterwood  02/15/2015, 8:44 AM     Attending physician's note   I have taken an interval history, reviewed the chart and examined the patient. I agree with the Advanced Practitioner's note, impression and recommendations. Ileus and ischemic colitis continue to steadily improve. Would not be unexpected to have some loose stools and mild bleeding with bowel  movements as her diet if advanced. Will remove NGT and advance to full liquids. GI signing off. Available if needed. Outpatient GI follow up with Dr. Jonette Eva.   Venita Lick. Russella Dar, MD [facility]

## 2015-02-15 NOTE — Progress Notes (Signed)
Consulting cardiologist: Dr. Verne Carrow  Seen for followup: Atrial flutter  Subjective:    Has been taking in PO's without problem so far.  Objective:   Temp:  [98 F (36.7 C)-99 F (37.2 C)] 98.6 F (37 C) (04/17 0400) Pulse Rate:  [58-138] 87 (04/17 0600) Resp:  [12-29] 12 (04/17 0600) BP: (93-170)/(48-125) 113/69 mmHg (04/17 0412) SpO2:  [96 %-100 %] 97 % (04/17 0600) Weight:  [162 lb 14.7 oz (73.9 kg)] 162 lb 14.7 oz (73.9 kg) (04/17 0527) Last BM Date: 02/14/15  Filed Weights   02/12/15 0400 02/14/15 0400 02/15/15 0527  Weight: 167 lb 5.3 oz (75.9 kg) 162 lb 11.2 oz (73.8 kg) 162 lb 14.7 oz (73.9 kg)    Intake/Output Summary (Last 24 hours) at 02/15/15 0737 Last data filed at 02/15/15 0600  Gross per 24 hour  Intake 2982.9 ml  Output   4550 ml  Net -1567.1 ml    Telemetry: Persistent atrial flutter with RVR, rate down in 90's this morning when asleep.  Exam:  General: Overweight woman, no distress.  Lungs: Clear, nonlabored.  Cardiac: Rapid, irregular, no gallop.  Abdomen: Decreased bowel sounds.  Extremities: No pitting.  Lab Results:  Basic Metabolic Panel:  Recent Labs Lab 02/13/15 0528 02/14/15 0545 02/15/15 0420  NA 135 137 132*  K 2.9* 3.2* 3.8  CL 99 99 100  CO2 GLUCOSE 200* 190* 273*  BUN CREATININE 0.70 0.61 0.55  CALCIUM 7.3* 7.4* 7.5*  MG 1.7 1.9 1.6    Liver Function Tests:  Recent Labs Lab 02/11/15 1525 02/11/15 1830 02/15/15 0420  AST ALT ALKPHOS 55 53 50  BILITOT 0.8 1.0 0.5  PROT 6.0 5.5* 5.0*  ALBUMIN 2.6* 2.5* 2.4*    CBC:  Recent Labs Lab 02/12/15 0438 02/13/15 0528  02/14/15 2000 02/15/15 0305 02/15/15 0420  WBC 4.0 3.9*  --   --   --  5.9  HGB 10.2* 10.0*  < > 10.6* 9.8* 9.8*  HCT 29.4* 28.3*  < > 30.5* 28.1* 28.0*  MCV 90.7 91.0  --   --   --  91.8  PLT 176 173  --   --   --  197  < > = values in this interval not displayed.  Cardiac  Enzymes:  Recent Labs Lab 02/11/15 1830 02/11/15 2255 02/12/15 0438  CKTOTAL 41  --   --   CKMB 11.4*  --   --   TROPONINI 0.04* 0.04* <0.03    Echocardiogram 02/11/25: Study Conclusions  - Left ventricle: The cavity size was normal. Wall thickness was increased in a pattern of mild LVH. Systolic function was normal. The estimated ejection fraction was in the range of 60% to 65%. - Aortic valve: There was trivial regurgitation. Valve area (VTI): 2.94 cm^2. Valve area (Vmax): 2.77 cm^2.   Medications:   Scheduled Medications: . carbamazepine  400 mg Oral BID  . famotidine  20 mg Oral Daily  . heparin subcutaneous  5,000 Units Subcutaneous 3 times per day  . insulin aspart  0-9 Units Subcutaneous 6 times per day  . magnesium sulfate 1 - 4 g bolus IVPB  2 g Intravenous Once  . metoprolol  7.5 mg Intravenous Q4H while awake  . piperacillin-tazobactam (ZOSYN)  IV  3.375 g Intravenous Q8H  . potassium chloride  40 mEq Oral BID  . potassium chloride  40 mEq Oral BID  .  QUEtiapine  800 mg Oral QHS  . sodium phosphate  Dextrose 5% IVPB  30 mmol Intravenous Once    Infusions: . sodium chloride 50 mL/hr at 02/14/15 1809  . amiodarone 30 mg/hr (02/14/15 1934)  . diltiazem (CARDIZEM) infusion 15 mg/hr (02/15/15 0159)  . fat emulsion 240 mL (02/14/15 1731)   And  . Marland Kitchen.TPN (CLINIMIX-E) Adult 30 mL/hr at 02/14/15 1732    PRN Medications: albuterol, ALPRAZolam, ondansetron **OR** ondansetron (ZOFRAN) IV   Assessment:   1. Newly documented atrial flutter with RVR, duration uncertain. She has been on amiodarone and diltiazem infusions as well as IV Lopressor on a standing basis. Currently not anticoagulated with GI bleeding and suspected ischemic colitis. Therefore no plans to electrically cardiovert. CHADSVASC score is 3. She is now taking PO's since yesterday.  2. Colitis, infectious versus ischemic. She is being managed by gastroenterology.  3. Ileus, improved.  4.  History of GI bleeding.  5. History of schizophrenia and developmental mental delays.   Plan/Discussion:    Continue IV amiodarone and Cardizem, will change to oral Lopressor and depending on heart rate control can make subsequent adjustments in her medications. No anticoagulation at this point, and therefore no plans to electrically cardiovert. Replete electrolytes as needed.   Jonelle SidleSamuel G. Emmalyne Giacomo, M.D., F.A.C.C.

## 2015-02-15 NOTE — Progress Notes (Signed)
Patient Demographics  Natasha Martin, is a 54 y.o. female, DOB - Apr 23, 1961, ZOX:[id number]  Admit date - 02/11/2015   Admitting Physician Starleen Arms, MD  Outpatient Primary MD for the patient is PROVIDER NOT IN SYSTEM   LOS - 4   No chief complaint on file.     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 fib with RVR, abdominal distention with colitis, transferred to [facility] on 4/13, for further management and treatment of her A. fib, abdominal distention and colitis, and altered mental status.  Subjective:   Natasha Martin today has, No headache, No chest pain, No abdominal pain - No Nausea, asking if she can have breakfast .  Assessment & Plan    Active Problems:   Atrial fibrillation with RVR   GI bleed   Abdominal distention   Noninfectious gastroenteritis and colitis   Diabetes mellitus type 2 in nonobese   Acute encephalopathy   Ileus   Acute ischemic colitis   Atrial flutter with rapid ventricular response  Acute encephalopathy - Most likely metabolic in the setting of hyponatremia, intra-abdominal process, with possible polypharmacy as she is on multiple high-dose antipsychotic medication. - CT head without contrast 4/13 no acute findings. - Ammonia within normal limits - EEG finding consistent with hepatic encephalopathy. - resolved  at baseline per family.  Atrial fibrillation/atrial flutter - New onset - Poorly controlled, on Cardizem and amiodarone drip, IV Lopressor transitioned to oral by cardiology, no plans for cardioversion as not on anticoagulation. - No  anti-coagulation currently giving her GI bleeding ( especially in the setting of possible ischemic colitis which will take 1-2 weeks to recover her GI) - 2-D echo showing EF 60-65%.  Colitis - Most likely ischemic, unlikely C. difficile as no diarrhea(no bowel movement). - Continue with empiric IV antibiotic coverage Zosyn X 1 week.   Ileus -NG tube was clamped, tolerating clear liquid diet, so will DC NG tube, advance to full liquid diet, replace electrolytes.  Type 2 diabetes mellitus - on insulin sliding scale, CBGs started to increase, will start on low dose Lantus.   Hypokalemia/hypophosphatemia - Repleted, will recheck in a.m.Marland Kitchen  Elevated d-dimer - She had d diameters of 14 at [facility], CT chest angiogram negative for PE, lower extremity Dopplers negative for DVT.  Schizophrenia - Resumed on Seroquel, will resume back today on low dose Haldol 10 mg twice a day (home dose is 40 mg twice a day was ), and trazodone 50 mg oral daily..  Code Status: Full  Family Communication: None at bedside  Disposition Plan: Remains on step down   Procedures  None   Consults   Gen. Surgery Critical care [facility] nephrology Cardiology   Medications  Scheduled Meds: . carbamazepine  400 mg Oral BID  . famotidine  20 mg Oral Daily  . feeding supplement (GLUCERNA SHAKE)  237 mL Oral BID BM  . heparin subcutaneous  5,000 Units Subcutaneous 3 times per day  . insulin aspart  0-9 Units Subcutaneous 6 times per day  .  metoprolol tartrate  25 mg Oral QID  . piperacillin-tazobactam (ZOSYN)  IV  3.375 g Intravenous Q8H  . potassium chloride  40 mEq Oral BID  . QUEtiapine  800 mg Oral QHS  . sodium phosphate  Dextrose 5% IVPB  30 mmol Intravenous Once   Continuous Infusions: . sodium chloride 50 mL/hr at 02/14/15 1809  . amiodarone 30 mg/hr (02/15/15 1300)  . diltiazem (CARDIZEM) infusion 15 mg/hr (02/15/15 1300)  . fat emulsion 240 mL (02/14/15 1731)   And  . Marland Kitchen.TPN  (CLINIMIX-E) Adult 30 mL/hr at 02/14/15 1732   PRN Meds:.albuterol, ALPRAZolam, ondansetron **OR** ondansetron (ZOFRAN) IV  DVT Prophylaxis -SCDs , subcutaneous heparin  Lab Results  Component Value Date   PLT 197 02/15/2015    Antibiotics    Anti-infectives    Start     Dose/Rate Route Frequency Ordered Stop   02/11/15 1800  metroNIDAZOLE (FLAGYL) IVPB 500 mg  Status:  Discontinued     500 mg 100 mL/hr over 60 Minutes Intravenous 4 times per day 02/11/15 1638 02/14/15 1256   02/11/15 1800  piperacillin-tazobactam (ZOSYN) IVPB 3.375 g     3.375 g 12.5 mL/hr over 240 Minutes Intravenous Every 8 hours 02/11/15 1650            Objective:   Filed Vitals:   02/15/15 0900 02/15/15 1056 02/15/15 1200 02/15/15 1334  BP: 136/79 168/74  112/70  Pulse: 129 130 124 124  Temp:      TempSrc:      Resp: 16 20 19    Height:      Weight:      SpO2: 97% 96% 99% 98%    Wt Readings from Last 3 Encounters:  02/15/15 73.9 kg (162 lb 14.7 oz)  01/28/14 71.215 kg (157 lb)  01/13/14 70.035 kg (154 lb 6.4 oz)     Intake/Output Summary (Last 24 hours) at 02/15/15 1425 Last data filed at 02/15/15 1400  Gross per 24 hour  Intake 3522.45 ml  Output   4065 ml  Net -542.55 ml     Physical Exam  Awake Alert, communicative, No new F.N deficits, Normal affect [place].AT,PERRAL Supple Neck,No JVD, No cervical lymphadenopathy appriciated.  Symmetrical Chest wall movement, Good air movement bilaterally, CTAB Irregular,No Gallops,Rubs or new Murmurs, No Parasternal Heave Diminished B.Sounds, Abd Soft, No tenderness, mildly distended ,No organomegaly appriciated, No rebound - guarding or rigidity. No Cyanosis, Clubbing or edema, No new Rash or bruise     Data Review   Micro Results Recent Results (from the past 240 hour(s))  MRSA PCR Screening     Status: None   Collection Time: 02/11/15  8:12 PM  Result Value Ref Range Status   MRSA by PCR NEGATIVE NEGATIVE Final    Comment:         The GeneXpert MRSA Assay (FDA approved for NASAL specimens only), is one component of a comprehensive MRSA colonization surveillance program. It is not intended to diagnose MRSA infection nor to guide or monitor treatment for MRSA infections.     Radiology Reports Dg Abd Acute W/chest  02/13/2015   CLINICAL DATA:  Altered mental status. Abdominal distention. Colitis.  EXAM: DG ABDOMEN ACUTE W/ 1V CHEST  COMPARISON:  CT examinations 414 and 02/11/2015  FINDINGS: Upright chest x-ray: The right IJ central venous catheter tip is in distal SVC near the cavoatrial junction. This is stable. The heart is normal in size. Low lung volumes with vascular crowding and bibasilar atelectasis. The NG tube is stable.  Two views of the abdomen demonstrate diffusely distended small bowel loops and right colon. Suspect functional obstruction due to severe colitis beginning in the mid transverse colon  IMPRESSION: Persistent dilated small bowel and right colon likely due to a functional obstruction related to severe colitis beginning in the mid transverse colon. No definite free air.  Stable bibasilar atelectasis.   Electronically Signed   By: Rudie Meyer M.D.   On: 02/13/2015 15:33    CBC  Recent Labs Lab 02/11/15 1525 02/12/15 0438 02/13/15 0528 02/14/15 1137 02/14/15 2000 02/15/15 0305 02/15/15 0420 02/15/15 1105  WBC 3.9* 4.0 3.9*  --   --   --  5.9  --   HGB 10.6* 10.2* 10.0* 10.4* 10.6* 9.8* 9.8* 10.3*  HCT 31.0* 29.4* 28.3* 30.3* 30.5* 28.1* 28.0* 29.9*  PLT 172 176 173  --   --   --  197  --   MCV 91.7 90.7 91.0  --   --   --  91.8  --   MCH 31.4 31.5 32.2  --   --   --  32.1  --   MCHC 34.2 34.7 35.3  --   --   --  35.0  --   RDW 12.8 12.8 13.1  --   --   --  13.4  --   LYMPHSABS  --   --   --   --   --   --  1.4  --   MONOABS  --   --   --   --   --   --  0.6  --   EOSABS  --   --   --   --   --   --  0.1  --   BASOSABS  --   --   --   --   --   --  0.1  --     Chemistries    Recent Labs Lab 02/11/15 1525 02/11/15 1830 02/12/15 0438 02/13/15 0528 02/14/15 0545 02/15/15 0420  NA 137  --  140 135 137 132*  K 3.6  --  2.9* 2.9* 3.2* 3.8  CL 102  --  106 99 99 100  CO2 26  --  GLUCOSE 194*  --  199* 200* 190* 273*  BUN 15  --  CREATININE 0.66  --  0.69 0.70 0.61 0.55  CALCIUM 7.9*  --  7.7* 7.3* 7.4* 7.5*  MG  --   --   --  1.7 1.9 1.6  AST 21 19  --   --   --  14  ALT 17 17  --   --   --  10  ALKPHOS 55 53  --   --   --  50  BILITOT 0.8 1.0  --   --   --  0.5   ------------------------------------------------------------------------------------------------------------------ estimated creatinine clearance is 75.6 mL/min (by C-G formula based on Cr of 0.55). ------------------------------------------------------------------------------------------------------------------ No results for input(s): HGBA1C in the last 72 hours. ------------------------------------------------------------------------------------------------------------------  Recent Labs  02/15/15 0420  TRIG 304*   ------------------------------------------------------------------------------------------------------------------ No results for input(s): TSH, T4TOTAL, T3FREE, THYROIDAB in the last 72 hours.  Invalid input(s): FREET3 ------------------------------------------------------------------------------------------------------------------ No results for input(s): VITAMINB12, FOLATE, FERRITIN, TIBC, IRON, RETICCTPCT in the last 72 hours.  Coagulation profile No results for input(s): INR, PROTIME in the last 168 hours.  No results for input(s): DDIMER in the last 72 hours.  Cardiac Enzymes  Recent  Labs Lab 02/11/15 1830 02/11/15 2255 02/12/15 0438  CKMB 11.4*  --   --   TROPONINI 0.04* 0.04* <0.03   ------------------------------------------------------------------------------------------------------------------ Invalid input(s):  POCBNP     Time Spent in minutes   35 minutes   Gwenivere Hiraldo M.D on 02/15/2015 at 2:25 PM  Between 7am to 7pm - Pager - [phone]  After 7pm go to [email or web address] - password [email or web address]  And look for the night coverage person covering for me after hours  [facility] Office  [phone]   **Disclaimer: This note may have been dictated with voice recognition software. Similar sounding words can inadvertently be transcribed and this note may contain transcription errors which may not have been corrected upon publication of note.**

## 2015-02-16 LAB — GLUCOSE, CAPILLARY
GLUCOSE-CAPILLARY: 280 mg/dL — AB (ref 70–99)
Glucose-Capillary: 259 mg/dL — ABNORMAL HIGH (ref 70–99)
Glucose-Capillary: 268 mg/dL — ABNORMAL HIGH (ref 70–99)
Glucose-Capillary: 200 mg/dL — ABNORMAL HIGH (ref 70–99)
Glucose-Capillary: 315 mg/dL — ABNORMAL HIGH (ref 70–99)
Glucose-Capillary: 343 mg/dL — ABNORMAL HIGH (ref 70–99)

## 2015-02-16 LAB — COMPREHENSIVE METABOLIC PANEL
ALT: 12 U/L (ref 0–35)
ANION GAP: 4 — AB (ref 5–15)
AST: 15 U/L (ref 0–37)
Albumin: 2.5 g/dL — ABNORMAL LOW (ref 3.5–5.2)
Alkaline Phosphatase: 56 U/L (ref 39–117)
BUN: 7 mg/dL (ref 6–23)
CO2: 23 mmol/L (ref 19–32)
CREATININE: 0.59 mg/dL (ref 0.50–1.10)
Calcium: 7.4 mg/dL — ABNORMAL LOW (ref 8.4–10.5)
Chloride: 98 mmol/L (ref 96–112)
GLUCOSE: 208 mg/dL — AB (ref 70–99)
POTASSIUM: 4.7 mmol/L (ref 3.5–5.1)
Sodium: 125 mmol/L — ABNORMAL LOW (ref 135–145)
Total Bilirubin: 0.5 mg/dL (ref 0.3–1.2)
Total Protein: 5 g/dL — ABNORMAL LOW (ref 6.0–8.3)

## 2015-02-16 LAB — MAGNESIUM: Magnesium: 1.6 mg/dL (ref 1.5–2.5)

## 2015-02-16 LAB — DIFFERENTIAL
BASOS PCT: 0 % (ref 0–1)
Basophils Absolute: 0 10*3/uL (ref 0.0–0.1)
Eosinophils Absolute: 0.1 10*3/uL (ref 0.0–0.7)
Eosinophils Relative: 1 % (ref 0–5)
LYMPHS PCT: 12 % (ref 12–46)
Lymphs Abs: 1.5 10*3/uL (ref 0.7–4.0)
MONOS PCT: 4 % (ref 3–12)
Monocytes Absolute: 0.5 10*3/uL (ref 0.1–1.0)
NEUTROS ABS: 10.2 10*3/uL — AB (ref 1.7–7.7)
Neutrophils Relative %: 83 % — ABNORMAL HIGH (ref 43–77)

## 2015-02-16 LAB — CBC
HCT: 26.6 % — ABNORMAL LOW (ref 36.0–46.0)
Hemoglobin: 9.1 g/dL — ABNORMAL LOW (ref 12.0–15.0)
MCH: 31.4 pg (ref 26.0–34.0)
MCHC: 34.2 g/dL (ref 30.0–36.0)
MCV: 91.7 fL (ref 78.0–100.0)
PLATELETS: 198 10*3/uL (ref 150–400)
RBC: 2.9 MIL/uL — AB (ref 3.87–5.11)
RDW: 13.4 % (ref 11.5–15.5)
WBC: 12.3 10*3/uL — AB (ref 4.0–10.5)

## 2015-02-16 LAB — OSMOLALITY: OSMOLALITY: 274 mosm/kg — AB (ref 275–300)

## 2015-02-16 LAB — OSMOLALITY, URINE: OSMOLALITY UR: 549 mosm/kg (ref 390–1090)

## 2015-02-16 LAB — TRIGLYCERIDES: Triglycerides: 310 mg/dL — ABNORMAL HIGH (ref <150)

## 2015-02-16 LAB — SODIUM, URINE, RANDOM: Sodium, Ur: 142 mmol/L

## 2015-02-16 LAB — PHOSPHORUS: Phosphorus: 2.6 mg/dL (ref 2.3–4.6)

## 2015-02-16 MED ORDER — MAGNESIUM SULFATE 2 GM/50ML IV SOLN
2.0000 g | Freq: Once | INTRAVENOUS | Status: AC
  Administered 2015-02-16: 2 g via INTRAVENOUS
  Filled 2015-02-16: qty 50

## 2015-02-16 MED ORDER — INSULIN GLARGINE 100 UNIT/ML ~~LOC~~ SOLN
10.0000 [IU] | Freq: Every day | SUBCUTANEOUS | Status: DC
  Administered 2015-02-16: 10 [IU] via SUBCUTANEOUS
  Filled 2015-02-16 (×2): qty 0.1

## 2015-02-16 MED ORDER — LIP MEDEX EX OINT
TOPICAL_OINTMENT | CUTANEOUS | Status: AC
  Filled 2015-02-16: qty 7

## 2015-02-16 NOTE — Progress Notes (Signed)
CSW continuing to follow.  Pt admitted from [facility] in [place], [place].   Per unit progression meeting, pt with a-fib RVR and continues to require amiodarone and cardizam drip. Pt not yet able to participate with PT/OT in order to assess pt disposition needs.   Pt will need PT/OT consult when stable to participate in order to adequately assess pt discharge needs and determine if pt will be able to return to [facility] vs. Need for higher level of care.   CSW to continue to follow to provide support and assist with pt disposition needs.   Loletta SpecterSuzanna Toua Stites, MSW, LCSW Clinical Social Work [phone]

## 2015-02-16 NOTE — Progress Notes (Signed)
Inpatient Diabetes Program Recommendations  AACE/ADA: New Consensus Statement on Inpatient Glycemic Control (2013)  Target Ranges:  Prepandial:   less than 140 mg/dL      Peak postprandial:   less than 180 mg/dL (1-2 hours)      Critically ill patients:  140 - 180 mg/dL   Reason for Visit: Hyperglycemia  Results for Natasha Martin, Natasha Martin (MRN [id number]) as of 02/16/2015 10:28  Ref. Range 02/15/2015 12:50 02/15/2015 16:07 02/15/2015 20:28 02/15/2015 23:53 02/16/2015 07:22  Glucose-Capillary Latest Ref Range: 70-99 mg/dL 409298 (H) 811300 (H) 914268 (H) 259 (H) 200 (H)   On TNA at 30/hour.  Would likely benefit from addition of basal insulin.  Consider adding Lantus 10 units QHS.  Will continue to follow. Thank you. Ailene Ardshonda Ahman Dugdale, RD, LDN, CDE Inpatient Diabetes Coordinator [phone]

## 2015-02-16 NOTE — Progress Notes (Signed)
CARE MANAGEMENT NOTE 02/16/2015  Patient:  Natasha Martin,Natasha Martin   Account Number:  [id number]  Date Initiated:  02/12/2015  Documentation initiated by:  Willi Borowiak  Subjective/Objective Assessment:   infectious colitis with sirs     Action/Plan:   home when stable/ hx of M.R. lives [facility] [place]   Anticipated DC Date:  02/19/2015   Anticipated DC Plan:  ASSISTED LIVING / REST HOME  In-house referral  Clinical Social Worker      DC Planning Services  CM consult      [facility]  NA   Choice offered to / List presented to:             Status of service:  In process, will continue to follow Medicare Important Message given?   (If response is "NO", the following Medicare IM given date fields will be blank) Date Medicare IM given:   Medicare IM given by:   Date Additional Medicare IM given:   Additional Medicare IM given by:    Discharge Disposition:    Per UR Regulation:  Reviewed for med. necessity/level of care/duration of stay  If discussed at Long Length of Stay Meetings, dates discussed:    Comments:  [id number]/[name] Stark JockDavis, RN, BSN, [place] [phone] Chart Reviewed. Discharge needs at time of review: None present will follow for needs. Chart note for progression: [id number]: Blood pressure is soft.  Heart rate is still rapid.  We will continue IV diltiazem for rate control today before switching to oral tomorrow.  We will continue IV loading of amiodarone to help with rate control.  Continue current oral metoprolol 25 mg 4 times a day. Follow hemoglobin carefully. Signed, Cassell Clementhomas Brackbill MD  February 12, 2015/Dulcemaria Bula L. Earlene Plateravis, RN, BSN, CCM. Case Management [employer] [phone] No discharge needs present of time of review.

## 2015-02-16 NOTE — Progress Notes (Signed)
Patient Name: Natasha Martin Date of Encounter: 02/16/2015     Active Problems:   Atrial fibrillation with RVR   GI bleed   Abdominal distention   Noninfectious gastroenteritis and colitis   Diabetes mellitus type 2 in nonobese   Acute encephalopathy   Ileus   Acute ischemic colitis   Atrial flutter with rapid ventricular response    SUBJECTIVE  Patient states she is feeling better today.  She is swallowing without difficulty.  Denies chest pain or shortness of breath.  Rhythm remains atrial flutter with rapid ventricular response  CURRENT MEDS . carbamazepine  400 mg Oral BID  . famotidine  20 mg Oral Daily  . feeding supplement (GLUCERNA SHAKE)  237 mL Oral BID BM  . haloperidol  10 mg Oral BID  . heparin subcutaneous  5,000 Units Subcutaneous 3 times per day  . insulin aspart  0-9 Units Subcutaneous 6 times per day  . magnesium sulfate 1 - 4 g bolus IVPB  2 g Intravenous Once  . metoprolol tartrate  25 mg Oral QID  . piperacillin-tazobactam (ZOSYN)  IV  3.375 g Intravenous Q8H  . potassium chloride  40 mEq Oral BID  . QUEtiapine  800 mg Oral QHS  . traZODone  50 mg Oral QHS    OBJECTIVE  Filed Vitals:   02/16/15 0000 02/16/15 0200 02/16/15 0400 02/16/15 0600  BP: 124/68  102/60   Pulse: 124 105 115 119  Temp: 98 F (36.7 C)  99.3 F (37.4 C)   TempSrc: Axillary  Axillary   Resp: Height:      Weight:   162 lb 7.7 oz (73.7 kg)   SpO2: 96% 99% 98% 98%    Intake/Output Summary (Last 24 hours) at 02/16/15 0739 Last data filed at 02/16/15 0600  Gross per 24 hour  Intake 2952.61 ml  Output   1915 ml  Net 1037.61 ml   Filed Weights   02/14/15 0400 02/15/15 0527 02/16/15 0400  Weight: 162 lb 11.2 oz (73.8 kg) 162 lb 14.7 oz (73.9 kg) 162 lb 7.7 oz (73.7 kg)    PHYSICAL EXAM  General: Pleasant, NAD.  Speech impediment.  Difficult to understand at times. Neuro: Alert and oriented X 3. Moves all extremities spontaneously. Psych: Normal  affect. HEENT:  Normal  Neck: Supple without bruits or JVD. Lungs:  Resp regular and unlabored, CTA. Heart: Irregularly irregular no s3, s4, or murmurs. Abdomen: Soft, non-tender, non-distended, soft bowel sounds. Extremities: No clubbing, cyanosis or edema. DP/PT/Radials 2+ and equal bilaterally.  Accessory Clinical Findings  CBC  Recent Labs  02/15/15 0420 02/15/15 1105 02/16/15 0600  WBC 5.9  --  12.3*  NEUTROABS 3.7  --  10.2*  HGB 9.8* 10.3* 9.1*  HCT 28.0* 29.9* 26.6*  MCV 91.8  --  91.7  PLT 197  --  198   Basic Metabolic Panel  Recent Labs  02/15/15 0420 02/16/15 0600  NA 132* 125*  K 3.8 4.7  CL 100 98  CO2 26 23  GLUCOSE 273* 208*  BUN 8 7  CREATININE 0.55 0.59  CALCIUM 7.5* 7.4*  MG 1.6 1.6  PHOS 2.0* 2.6   Liver Function Tests  Recent Labs  02/15/15 0420 02/16/15 0600  AST 14 15  ALT 10 12  ALKPHOS 50 56  BILITOT 0.5 0.5  PROT 5.0* 5.0*  ALBUMIN 2.4* 2.5*   No results for input(s): LIPASE, AMYLASE in the last 72 hours. Cardiac Enzymes No  results for input(s): CKTOTAL, CKMB, CKMBINDEX, TROPONINI in the last 72 hours. BNP Invalid input(s): POCBNP D-Dimer No results for input(s): DDIMER in the last 72 hours. Hemoglobin A1C No results for input(s): HGBA1C in the last 72 hours. Fasting Lipid Panel  Recent Labs  02/15/15 0420  TRIG 304*   Thyroid Function Tests No results for input(s): TSH, T4TOTAL, T3FREE, THYROIDAB in the last 72 hours.  Invalid input(s): FREET3  TELE  Atrial flutter with controlled ventricular response  ECG  2-D echo: 02/12/15 - Left ventricle: The cavity size was normal. Wall thickness was increased in a pattern of mild LVH. Systolic function was normal. The estimated ejection fraction was in the range of 60% to 65%. - Aortic valve: There was trivial regurgitation. Valve area (VTI): 2.94 cm^2. Valve area (Vmax): 2.77 cm^2. Radiology/Studies  Ct Head Wo Contrast  02/11/2015   CLINICAL DATA:   Altered mental status today.  EXAM: CT HEAD WITHOUT CONTRAST  TECHNIQUE: Contiguous axial images were obtained from the base of the skull through the vertex without intravenous contrast.  COMPARISON:  02/09/2015  FINDINGS: The ventricles are normal in size and configuration. No extra-axial fluid collections are identified. The gray-white differentiation is normal. No CT findings for acute intracranial process such as hemorrhage or infarction. No mass lesions. The brainstem and cerebellum are grossly normal.  The bony structures are intact. The paranasal sinuses and mastoid air cells are clear. The globes are intact.  IMPRESSION: Normal head CT.  No change since 2 days ago.   Electronically Signed   By: Rudie MeyerP.  Gallerani M.D.   On: 02/11/2015 21:44   Ct Angio Chest Pe W/cm &/or Wo Cm  02/12/2015   CLINICAL DATA:  Tachycardia with elevated D-dimer.  EXAM: CT ANGIOGRAPHY CHEST WITH CONTRAST  TECHNIQUE: Multidetector CT imaging of the chest was performed using the standard protocol during bolus administration of intravenous contrast. Multiplanar CT image reconstructions and MIPs were obtained to evaluate the vascular anatomy.  CONTRAST:  100mL OMNIPAQUE IOHEXOL 350 MG/ML SOLN  COMPARISON:  Chest x-ray 02/11/2015 and CT abdomen 02/11/2015  FINDINGS: The lungs are adequately inflated with minimal linear scarring over the left lower lobe. There is a tiny amount of right pleural fluid and associated atelectasis. Airways are within normal.  There is mild prominence of the heart. There is a sub cm lymph node over the left pericardial phrenic angle. Pulmonary arterial system is normal without evidence of emboli. Right-sided aortic arch is present with aberrant left subclavian artery coursing posterior to the esophagus. Nasogastric tube is present. There is no mediastinal, hilar or axillary adenopathy.  Images through the upper abdomen demonstrate minimal diffuse hepatic steatosis. Tiny amount of perihepatic fluid unchanged.  Remainder of the exam is unchanged.  Review of the MIP images confirms the above findings.  IMPRESSION: No evidence pulmonary embolism.  Tiny amount of right pleural fluid minimal right basilar atelectasis. Linear scarring left base.  Mild cardiomegaly. Right-sided aortic arch with aberrant left subclavian artery.   Electronically Signed   By: Elberta Fortisaniel  Boyle M.D.   On: 02/12/2015 17:25   Ct Abdomen Pelvis W Contrast  02/11/2015   CLINICAL DATA:  Abdominal pain and distention, decreased bowel sounds.  EXAM: CT ABDOMEN AND PELVIS WITH CONTRAST  TECHNIQUE: Multidetector CT imaging of the abdomen and pelvis was performed using the standard protocol following bolus administration of intravenous contrast.  CONTRAST:  100mL OMNIPAQUE IOHEXOL 300 MG/ML  SOLN  COMPARISON:  Radiographs 1 day prior, CT abdomen 2 days prior.  FINDINGS: Bibasilar atelectasis, minimal pleural thickening dependently.  Enteric tube in place, tip in the duodenum. There is fluid distending the stomach. Diffusely fluid-filled small bowel with small bowel dilatation, measuring up to 4.9 cm. No definite transition point, the distal small bowel loops are decompressed. There is diffuse colonic wall thickening with surrounding perienteric inflammatory change from the mid transverse colon to the rectum. The distal colon is fluid-filled. The more proximal colon is normal in caliber with small volume of stool. The appendix is normal.  No free air or pneumatosis. Small amount of free fluid in the perisplenic space and pelvis is likely reactive. The mesenteric vessels appear patent.  No focal hepatic lesion. There is an elongated left hepatic lobe. The gallbladder is physiologically distended. The spleen and pancreas are unremarkable. Unchanged adrenal nodularity. Kidneys demonstrate symmetric enhancement and excretion. No hydronephrosis or perinephric stranding.  Abdominal aorta is normal in caliber, minimal atherosclerosis. No retroperitoneal adenopathy.  Mild whole body wall edema.  Within the pelvis the urinary bladder is decompressed by Foley catheter. Uterus remains in situ, atrophic, normal for age. No adnexal mass.  There are no acute or suspicious osseous abnormalities. Remote lower left rib fractures.  IMPRESSION: 1. Colitis extending from the mid transverse colon through the rectum. Findings may be infectious or inflammatory. There is no perforation or abscess. 2. Dilated and fluid-filled small bowel loops in the central and lower abdomen. This may be reactive ileus versus small bowel enteritis. 3. Small amount of free fluid in the pelvis and perisplenic space.   Electronically Signed   By: Rubye Oaks M.D.   On: 02/11/2015 21:53   Dg Chest Port 1 View  02/11/2015   CLINICAL DATA:  Central catheter placement  EXAM: PORTABLE CHEST - 1 VIEW  COMPARISON:  February 10, 2015  FINDINGS: Central catheter tip is at the cavoatrial junction. Nasogastric tube tip and side port are in the distal stomach. No pneumothorax. There is no edema or consolidation. Heart is upper normal in size with pulmonary vascularity within normal limits. No adenopathy.  IMPRESSION: Tube and catheter positions as described without pneumothorax. No edema or consolidation.   Electronically Signed   By: Bretta Bang III M.D.   On: 02/11/2015 17:33   Dg Abd Acute W/chest  02/13/2015   CLINICAL DATA:  Altered mental status. Abdominal distention. Colitis.  EXAM: DG ABDOMEN ACUTE W/ 1V CHEST  COMPARISON:  CT examinations 414 and 02/11/2015  FINDINGS: Upright chest x-ray: The right IJ central venous catheter tip is in distal SVC near the cavoatrial junction. This is stable. The heart is normal in size. Low lung volumes with vascular crowding and bibasilar atelectasis. The NG tube is stable.  Two views of the abdomen demonstrate diffusely distended small bowel loops and right colon. Suspect functional obstruction due to severe colitis beginning in the mid transverse colon  IMPRESSION:  Persistent dilated small bowel and right colon likely due to a functional obstruction related to severe colitis beginning in the mid transverse colon. No definite free air.  Stable bibasilar atelectasis.   Electronically Signed   By: Rudie Meyer M.D.   On: 02/13/2015 15:33    ASSESSMENT AND PLAN  1. Newly documented atrial flutter with RVR, duration uncertain. She has been on amiodarone and diltiazem infusions as well as IV Lopressor on a standing basis. Currently not anticoagulated with GI bleeding and suspected ischemic colitis. Therefore no plans to electrically cardiovert. CHADSVASC score is 3. She is now taking PO's.  2. Colitis,  infectious versus ischemic. She is being managed by gastroenterology.  3. Ileus, improved.  4. History of GI bleeding.  5. History of schizophrenia and developmental mental delays.  6.  Hyponatremia  Plan: Blood pressure is soft.  Heart rate is still rapid.  We will continue IV diltiazem for rate control today before switching to oral tomorrow.  We will continue IV loading of amiodarone to help with rate control.  Continue current oral metoprolol 25 mg 4 times a day. Follow hemoglobin carefully. Signed, Cassell Clement MD

## 2015-02-16 NOTE — Progress Notes (Signed)
Patient Demographics  Natasha Martin, is a 54 y.o. female, DOB - [birth year], WUJ:[id number]  Admit date - 02/11/2015   Admitting Physician Starleen Arms, MD  Outpatient Primary MD for the patient is PROVIDER NOT IN SYSTEM   LOS - 5   No chief complaint on file.     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 fib with RVR, abdominal distention with colitis, transferred to [facility] on 4/13, for further management and treatment of her A. fib, abdominal distention and colitis, and altered mental status.  Subjective:   [name] today has, No headache, No chest pain, No abdominal pain - No Nausea, tolerated full liquid diet yesterday, agent had large fluid intake overnight. No bowel movement overnight.  Assessment & Plan    Active Problems:   Atrial fibrillation with RVR   GI bleed   Abdominal distention   Noninfectious gastroenteritis and colitis   Diabetes mellitus type 2 in nonobese   Acute encephalopathy   Ileus   Acute ischemic colitis   Atrial flutter with rapid ventricular response  Acute encephalopathy - Most likely metabolic in the setting of hyponatremia, intra-abdominal process, with possible polypharmacy as she is on multiple high-dose antipsychotic medication. - CT head without contrast 4/13 no acute findings. - Ammonia within normal limits - EEG finding consistent with hepatic encephalopathy. - resolved  at baseline per family.  Atrial fibrillation/atrial flutter - New onset - Managed by cardiology on Cardizem and amiodarone drip, oral metoprolol, no plans for cardioversion  as not on anticoagulation. - No anti-coagulation currently giving her GI bleeding ( especially in the setting of possible ischemic colitis which will take 1-2 weeks to recover her GI) - 2-D echo showing EF 60-65%.  Colitis - Most likely ischemic, unlikely C. difficile as no diarrhea (no bowel movement). - Treated with IV Zosyn 6 days, a febrile, with psychosis almost resolved, will discontinue IV Zosyn.  Hyponatremia - Patient with significant hyponatremia 125 today, patient had excessive fluid intake over the last 24 hours at as per staff, to drink multiple ginger ale overnight, possibly due to psychogenic polydipsia, initial labs showing elevated urine sodium, though will await urine osmolality and serum osmolality as SIADH is a possibility, will continue with fluid restriction 1200 mL per day.  Ileus -NG tube discontinued, tolerated clear liquid diet yesterday, will advance to full liquid diet today.  Type 2 diabetes mellitus - on insulin sliding scale, CBGs started to increase, start on 10 units subcutaneous Lantus daily   Hypokalemia/hypophosphatemia - Repleted,   Elevated d-dimer - She had d diameters of 14 at [facility], CT chest angiogram negative for PE, lower extremity Dopplers negative for DVT.  Schizophrenia - Resumed on Seroquel, and carbamazepine and dose Haldol 10 mg twice a day (home dose is 40 mg twice a day was ), and trazodone 50 mg oral daily..  Code Status: Full  Family Communication: None at bedside  Disposition Plan: Remains on step down   Procedures  None   Consults   [facility]   Medications  Scheduled Meds: . carbamazepine  400 mg Oral BID  . famotidine  20 mg Oral Daily  . feeding supplement (GLUCERNA SHAKE)  237 mL Oral BID BM  . haloperidol  10 mg Oral BID  . heparin subcutaneous  5,000 Units Subcutaneous 3 times per day  . insulin aspart  0-9 Units Subcutaneous 6 times per day  .  metoprolol tartrate  25 mg Oral QID  . potassium chloride  40 mEq Oral BID  . QUEtiapine  800 mg Oral QHS  . traZODone  50 mg Oral QHS   Continuous Infusions: . amiodarone 30 mg/hr (02/16/15 0750)  . diltiazem (CARDIZEM) infusion 15 mg/hr (02/16/15 1133)   PRN Meds:.albuterol, ALPRAZolam, ondansetron **OR** ondansetron (ZOFRAN) IV  DVT Prophylaxis -SCDs , subcutaneous heparin  Lab Results  Component Value Date   PLT 198 02/16/2015    Antibiotics    Anti-infectives    Start     Dose/Rate Route Frequency Ordered Stop   02/11/15 1800  metroNIDAZOLE (FLAGYL) IVPB 500 mg  Status:  Discontinued     500 mg 100 mL/hr over 60 Minutes Intravenous 4 times per day 02/11/15 1638 02/14/15 1256   02/11/15 1800  piperacillin-tazobactam (ZOSYN) IVPB 3.375 g  Status:  Discontinued     3.375 g 12.5 mL/hr over 240 Minutes Intravenous Every 8 hours 02/11/15 1650 02/16/15 1054          Objective:   Filed Vitals:   02/16/15 0800 02/16/15 0900 02/16/15 1000 02/16/15 1100  BP: 146/70 128/77 117/74 124/74  Pulse: 81 122 125 119  Temp: 98.6 F (37 C)     TempSrc: Oral     Resp: 17 19 26 17   Height:      Weight:      SpO2: 97% 97% 100% 98%    Wt Readings from Last 3 Encounters:  02/16/15 73.7 kg (162 lb 7.7 oz)  01/28/14 71.215 kg (157 lb)  01/13/14 70.035 kg (154 lb 6.4 oz)     Intake/Output Summary (Last 24 hours) at 02/16/15 1134 Last data filed at 02/16/15 1100  Gross per 24 hour  Intake 2969.29 ml  Output   1800 ml  Net 1169.29 ml     Physical Exam  Awake Alert, communicative, No new F.N deficits, Normal affect Peak.AT,PERRAL Supple Neck,No JVD, No cervical lymphadenopathy appriciated.  Symmetrical Chest wall movement, Good air movement bilaterally, CTAB Irregular,No Gallops,Rubs or new Murmurs, No Parasternal Heave Diminished B.Sounds, Abd Soft, No tenderness, mildly distended ,No organomegaly appriciated, No rebound - guarding or rigidity. No Cyanosis, Clubbing or  edema, No new Rash or bruise     Data Review   Micro Results Recent Results (from the past 240 hour(s))  MRSA PCR Screening     Status: None   Collection Time: 02/11/15  8:12 PM  Result Value Ref Range Status   MRSA by PCR NEGATIVE NEGATIVE Final    Comment:        The GeneXpert MRSA Assay (FDA approved for NASAL specimens only), is one component of a comprehensive MRSA colonization surveillance program. It is not intended to diagnose MRSA infection nor to guide or monitor treatment for MRSA infections.     Radiology Reports No results found.  CBC  Recent Labs Lab 02/11/15 1525 02/12/15 0438 02/13/15 0528  02/14/15 2000 02/15/15 0305 02/15/15 0420 02/15/15 1105 02/16/15 0600  WBC 3.9* 4.0 3.9*  --   --   --  5.9  --  12.3*  HGB 10.6* 10.2* 10.0*  < > 10.6* 9.8* 9.8* 10.3* 9.1*  HCT 31.0* 29.4* 28.3*  < > 30.5* 28.1* 28.0* 29.9* 26.6*  PLT 172 176 173  --   --   --  197  --  198  MCV 91.7 90.7 91.0  --   --   --  91.8  --  91.7  MCH 31.4 31.5 32.2  --   --   --  32.1  --  31.4  MCHC 34.2 34.7 35.3  --   --   --  35.0  --  34.2  RDW 12.8 12.8 13.1  --   --   --  13.4  --  13.4  LYMPHSABS  --   --   --   --   --   --  1.4  --  1.5  MONOABS  --   --   --   --   --   --  0.6  --  0.5  EOSABS  --   --   --   --   --   --  0.1  --  0.1  BASOSABS  --   --   --   --   --   --  0.1  --  0.0  < > = values in this interval not displayed.  Chemistries   Recent Labs Lab 02/11/15 1525 02/11/15 1830 02/12/15 0438 02/13/15 0528 02/14/15 0545 02/15/15 0420 02/16/15 0600  NA 137  --  140 135 137 132* 125*  K 3.6  --  2.9* 2.9* 3.2* 3.8 4.7  CL 102  --  106 99 99 100 98  CO2 26  --  GLUCOSE 194*  --  199* 200* 190* 273* 208*  BUN 15  --  CREATININE 0.66  --  0.69 0.70 0.61 0.55 0.59  CALCIUM 7.9*  --  7.7* 7.3* 7.4* 7.5* 7.4*  MG  --   --   --  1.7 1.9 1.6 1.6  AST 21 19  --   --   --  14 15  ALT 17 17  --   --   --  10 12  ALKPHOS  55 53  --   --   --  50 56  BILITOT 0.8 1.0  --   --   --  0.5 0.5   ------------------------------------------------------------------------------------------------------------------ estimated creatinine clearance is 75.6 mL/min (by C-G formula based on Cr of 0.59). ------------------------------------------------------------------------------------------------------------------ No results for input(s): HGBA1C in the last 72 hours. ------------------------------------------------------------------------------------------------------------------  Recent Labs  02/15/15 0420 02/16/15 0600  TRIG 304* 310*   ------------------------------------------------------------------------------------------------------------------ No results for input(s): TSH, T4TOTAL, T3FREE, THYROIDAB in the last 72 hours.  Invalid input(s): FREET3 ------------------------------------------------------------------------------------------------------------------ No results for input(s): VITAMINB12, FOLATE, FERRITIN, TIBC, IRON, RETICCTPCT in the last 72 hours.  Coagulation profile No results for input(s): INR, PROTIME in the last 168 hours.  No results for input(s): DDIMER in the last 72 hours.  Cardiac Enzymes  Recent Labs Lab 02/11/15 1830 02/11/15 2255 02/12/15 0438  CKMB 11.4*  --   --   TROPONINI 0.04* 0.04* <0.03   ------------------------------------------------------------------------------------------------------------------ Invalid input(s): POCBNP     Time Spent in minutes   35 minutes   ELGERGAWY, DAWOOD M.D on 02/16/2015 at 11:34 AM  Between 7am to 7pm - Pager - [phone]  After 7pm go to [email or web address] - password [email or web address]  And look for the night coverage person  covering for me after hours  [facility] Office  [phone]   **Disclaimer: This note may have been dictated with voice recognition software. Similar sounding words can inadvertently be transcribed and  this note may contain transcription errors which may not have been corrected upon publication of note.**

## 2015-02-17 LAB — GLUCOSE, CAPILLARY
GLUCOSE-CAPILLARY: 252 mg/dL — AB (ref 70–99)
GLUCOSE-CAPILLARY: 158 mg/dL — AB (ref 70–99)
GLUCOSE-CAPILLARY: 354 mg/dL — AB (ref 70–99)
Glucose-Capillary: 215 mg/dL — ABNORMAL HIGH (ref 70–99)
Glucose-Capillary: 238 mg/dL — ABNORMAL HIGH (ref 70–99)
Glucose-Capillary: 242 mg/dL — ABNORMAL HIGH (ref 70–99)

## 2015-02-17 LAB — BASIC METABOLIC PANEL
Anion gap: 6 (ref 5–15)
BUN: 7 mg/dL (ref 6–23)
CALCIUM: 7.9 mg/dL — AB (ref 8.4–10.5)
CHLORIDE: 98 mmol/L (ref 96–112)
CO2: 25 mmol/L (ref 19–32)
Creatinine, Ser: 0.61 mg/dL (ref 0.50–1.10)
GFR calc non Af Amer: >90 mL/min (ref >90)
Glucose, Bld: 253 mg/dL — ABNORMAL HIGH (ref 70–99)
Potassium: 4.8 mmol/L (ref 3.5–5.1)
SODIUM: 129 mmol/L — AB (ref 135–145)

## 2015-02-17 LAB — CBC
HCT: 26.2 % — ABNORMAL LOW (ref 36.0–46.0)
Hemoglobin: 8.9 g/dL — ABNORMAL LOW (ref 12.0–15.0)
MCH: 31.2 pg (ref 26.0–34.0)
MCHC: 34 g/dL (ref 30.0–36.0)
MCV: 91.9 fL (ref 78.0–100.0)
PLATELETS: 204 10*3/uL (ref 150–400)
RBC: 2.85 MIL/uL — ABNORMAL LOW (ref 3.87–5.11)
RDW: 13.4 % (ref 11.5–15.5)
WBC: 6.9 10*3/uL (ref 4.0–10.5)

## 2015-02-17 LAB — PREALBUMIN
PREALBUMIN: 12 mg/dL — AB (ref 17–34)
Prealbumin: 12 mg/dL — ABNORMAL LOW (ref 17–34)

## 2015-02-17 LAB — CLOSTRIDIUM DIFFICILE BY PCR: Toxigenic C. Difficile by PCR: NEGATIVE

## 2015-02-17 MED ORDER — DIGOXIN 0.25 MG/ML IJ SOLN
0.2500 mg | Freq: Once | INTRAMUSCULAR | Status: AC
  Administered 2015-02-17: 0.25 mg via INTRAVENOUS
  Filled 2015-02-17: qty 1

## 2015-02-17 MED ORDER — AMIODARONE HCL 200 MG PO TABS
400.0000 mg | ORAL_TABLET | Freq: Two times a day (BID) | ORAL | Status: DC
  Administered 2015-02-17 – 2015-02-19 (×6): 400 mg via ORAL
  Filled 2015-02-17 (×8): qty 2

## 2015-02-17 MED ORDER — INSULIN GLARGINE 100 UNIT/ML ~~LOC~~ SOLN
10.0000 [IU] | Freq: Once | SUBCUTANEOUS | Status: AC
Start: 2015-02-17 — End: 2015-02-17
  Administered 2015-02-17: 10 [IU] via SUBCUTANEOUS
  Filled 2015-02-17: qty 0.1

## 2015-02-17 MED ORDER — DIGOXIN 125 MCG PO TABS
0.1250 mg | ORAL_TABLET | Freq: Every day | ORAL | Status: DC
  Administered 2015-02-18 – 2015-02-19 (×2): 0.125 mg via ORAL
  Filled 2015-02-17 (×3): qty 1

## 2015-02-17 MED ORDER — INSULIN GLARGINE 100 UNIT/ML ~~LOC~~ SOLN
30.0000 [IU] | Freq: Every day | SUBCUTANEOUS | Status: DC
  Administered 2015-02-18: 30 [IU] via SUBCUTANEOUS
  Filled 2015-02-17 (×2): qty 0.3

## 2015-02-17 MED ORDER — NYSTATIN 100000 UNIT/GM EX POWD
Freq: Two times a day (BID) | CUTANEOUS | Status: DC
  Administered 2015-02-17 – 2015-02-19 (×6): via TOPICAL
  Administered 2015-02-20: 1 via TOPICAL
  Filled 2015-02-17: qty 15

## 2015-02-17 MED ORDER — INSULIN GLARGINE 100 UNIT/ML ~~LOC~~ SOLN
20.0000 [IU] | Freq: Every day | SUBCUTANEOUS | Status: DC
Start: 2015-02-17 — End: 2015-02-17
  Administered 2015-02-17: 20 [IU] via SUBCUTANEOUS
  Filled 2015-02-17: qty 0.2

## 2015-02-17 MED ORDER — INSULIN ASPART 100 UNIT/ML ~~LOC~~ SOLN
0.0000 [IU] | Freq: Three times a day (TID) | SUBCUTANEOUS | Status: DC
  Administered 2015-02-17: 3 [IU] via SUBCUTANEOUS
  Administered 2015-02-17: 5 [IU] via SUBCUTANEOUS
  Administered 2015-02-17: 15 [IU] via SUBCUTANEOUS
  Administered 2015-02-18: 11 [IU] via SUBCUTANEOUS
  Administered 2015-02-18: 100 [IU] via SUBCUTANEOUS
  Administered 2015-02-18: 8 [IU] via SUBCUTANEOUS

## 2015-02-17 MED ORDER — QUETIAPINE FUMARATE 300 MG PO TABS
600.0000 mg | ORAL_TABLET | Freq: Every day | ORAL | Status: DC
  Administered 2015-02-17 – 2015-02-19 (×3): 600 mg via ORAL
  Filled 2015-02-17: qty 2
  Filled 2015-02-17 (×2): qty 6
  Filled 2015-02-17: qty 2

## 2015-02-17 NOTE — Progress Notes (Addendum)
Patient Demographics  Natasha Martin, is a 54 y.o. female, DOB - [birth year], ZOX:[id number]  Admit date - 02/11/2015   Admitting Physician Starleen Arms, MD  Outpatient Primary MD for the patient is PROVIDER NOT IN SYSTEM   LOS - 6   No chief complaint on file.     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 fib with RVR, abdominal distention with colitis, transferred to [facility] on 4/13, for further management and treatment of her A. fib, abdominal distention and colitis, and altered mental status. Workup was significant for possible ischemic colitis, treated with IV Zosyn total of 6 days, new onset A. fib with RVR is managed by cardiology, very hard to control, patient ileus seems to be at his resolving, tolerating advanced diet.  Subjective:   Natasha Martin today has, No headache, No chest pain, No abdominal pain - No Nausea, tolerated full liquid diet yesterday, patient currently having bowel movement, no blood.  Assessment & Plan    Active Problems:   Atrial fibrillation with RVR   GI bleed   Abdominal distention   Noninfectious gastroenteritis and colitis   Diabetes mellitus type 2 in nonobese   Acute encephalopathy   Ileus   Acute ischemic colitis   Atrial flutter with rapid ventricular response  Acute encephalopathy - Most likely metabolic in the setting of hyponatremia, intra-abdominal process, with possible polypharmacy as she is on multiple high-dose antipsychotic medication. - CT head without contrast 4/13 no acute findings. - Ammonia within normal limits - EEG finding consistent  with hepatic encephalopathy. - resolved  at baseline per family.  Atrial fibrillation/atrial flutter - New onset - Managed by cardiology initially on Cardizem and amiodarone drip which are being transitioned to oral today , heart rate remains poorly controlled , receiving IV digoxin as per cardiac recommendation , if no improvement. Likely will go back on amiodarone drip , on oral metoprolol, no plans for cardioversion as not on anticoagulation. - No anti-coagulation currently giving her GI bleeding ( especially in the setting of possible ischemic colitis which will take 1-2 weeks to recover prer GI) - 2-D echo showing EF 60-65%.  Colitis - Most likely ischemic, unlikely C. difficile as no diarrhea . - Treated with IV Zosyn 6 days, a febrile.  Hyponatremia -Hyponatremia improving today went from 125 yesterday to 129 today, initially thought due to to psychogenic polydipsia as patient has excessive fluid intake, but labs showing increased urine sodium, osmolality, and decreased urine osmolality which is compatible with SIADH, improving on fluid restriction.  Ileus -NG tube discontinued, tolerated full liquid diet yesterday, will advance to soft diet today.  Type 2 diabetes mellitus - Poorly controlled, as she was resumed and diet, will increase Lantus from 20-30 units daily.   Hypokalemia - Repleting, will recheck in a.m.  hypophosphatemia - Repleted,   Elevated d-dimer - She had d diameters of 14 at [facility], CT chest angiogram negative for PE, lower extremity Dopplers negative for DVT.  Schizophrenia - Resumed on Seroquel, and carbamazepine and dose  Haldol 10 mg twice a day (home dose is 40 mg twice a day was ), and trazodone 50 mg oral daily. Giving patient low blood pressure overnight, will decrease his evening dose Seroquel from 800 to 600.   Lines - Right IJ TLC inserted 4/13  Code Status: Full  Family Communication: None at bedside  Disposition Plan: Remains  on step down, does still in RVR.   Procedures  None   Consults   Gen. Surgery Critical care [facility] nephrology Cardiology   Medications  Scheduled Meds: . amiodarone  400 mg Oral BID  . carbamazepine  400 mg Oral BID  . [START ON 02/18/2015] digoxin  0.125 mg Oral Daily  . famotidine  20 mg Oral Daily  . feeding supplement (GLUCERNA SHAKE)  237 mL Oral BID BM  . haloperidol  10 mg Oral BID  . heparin subcutaneous  5,000 Units Subcutaneous 3 times per day  . insulin aspart  0-15 Units Subcutaneous TID WC  . insulin glargine  10 Units Subcutaneous Once  . [START ON 02/18/2015] insulin glargine  30 Units Subcutaneous Daily  . metoprolol tartrate  25 mg Oral QID  . nystatin   Topical BID  . QUEtiapine  600 mg Oral QHS  . traZODone  50 mg Oral QHS   Continuous Infusions:   PRN Meds:.albuterol, ALPRAZolam, ondansetron **OR** ondansetron (ZOFRAN) IV  DVT Prophylaxis -SCDs , subcutaneous heparin  Lab Results  Component Value Date   PLT 204 02/17/2015    Antibiotics    Anti-infectives    Start     Dose/Rate Route Frequency Ordered Stop   02/11/15 1800  metroNIDAZOLE (FLAGYL) IVPB 500 mg  Status:  Discontinued     500 mg 100 mL/hr over 60 Minutes Intravenous 4 times per day 02/11/15 1638 02/14/15 1256   02/11/15 1800  piperacillin-tazobactam (ZOSYN) IVPB 3.375 g  Status:  Discontinued     3.375 g 12.5 mL/hr over 240 Minutes Intravenous Every 8 hours 02/11/15 1650 02/16/15 1054          Objective:   Filed Vitals:   02/17/15 1105 02/17/15 1107 02/17/15 1109 02/17/15 1200  BP:    132/91  Pulse: 141 145 140 141  Temp:    98.2 F (36.8 C)  TempSrc:    Oral  Resp: 17 18 21 18   Height:      Weight:      SpO2: 98% 93% 97% 97%    Wt Readings from Last 3 Encounters:  02/16/15 73.7 kg (162 lb 7.7 oz)  01/28/14 71.215 kg (157 lb)  01/13/14 70.035 kg (154 lb 6.4 oz)     Intake/Output Summary (Last 24 hours) at 02/17/15 1336 Last data filed at 02/17/15 1300   Gross per 24 hour  Intake 2226.91 ml  Output   1690 ml  Net 536.91 ml     Physical Exam  Awake Alert, communicative, No new F.N deficits, Normal affect [place].AT,PERRAL, right IJ TLC Supple Neck,No JVD, No cervical lymphadenopathy appriciated.  Symmetrical Chest wall movement, Good air movement bilaterally, CTAB Irregular,No Gallops,Rubs or new Murmurs, No Parasternal Heave Diminished B.Sounds, Abd Soft, No tenderness, mildly distended ,No organomegaly appriciated, No rebound - guarding or rigidity. No Cyanosis, Clubbing or edema, has fungal rash in groin area.    Data Review   Micro Results Recent Results (from the past 240 hour(s))  MRSA PCR Screening     Status: None   Collection Time: 02/11/15  8:12 PM  Result Value Ref Range Status  MRSA by PCR NEGATIVE NEGATIVE Final    Comment:        The GeneXpert MRSA Assay (FDA approved for NASAL specimens only), is one component of a comprehensive MRSA colonization surveillance program. It is not intended to diagnose MRSA infection nor to guide or monitor treatment for MRSA infections.     Radiology Reports No results found.  CBC  Recent Labs Lab 02/12/15 0438 02/13/15 0528  02/15/15 0305 02/15/15 0420 02/15/15 1105 02/16/15 0600 02/17/15 0415  WBC 4.0 3.9*  --   --  5.9  --  12.3* 6.9  HGB 10.2* 10.0*  < > 9.8* 9.8* 10.3* 9.1* 8.9*  HCT 29.4* 28.3*  < > 28.1* 28.0* 29.9* 26.6* 26.2*  PLT 176 173  --   --  197  --  198 204  MCV 90.7 91.0  --   --  91.8  --  91.7 91.9  MCH 31.5 32.2  --   --  32.1  --  31.4 31.2  MCHC 34.7 35.3  --   --  35.0  --  34.2 34.0  RDW 12.8 13.1  --   --  13.4  --  13.4 13.4  LYMPHSABS  --   --   --   --  1.4  --  1.5  --   MONOABS  --   --   --   --  0.6  --  0.5  --   EOSABS  --   --   --   --  0.1  --  0.1  --   BASOSABS  --   --   --   --  0.1  --  0.0  --   < > = values in this interval not displayed.  Chemistries   Recent Labs Lab 02/11/15 1525 02/11/15 1830   02/13/15 0528 02/14/15 0545 02/15/15 0420 02/16/15 0600 02/17/15 0415  NA 137  --   < > 135 137 132* 125* 129*  K 3.6  --   < > 2.9* 3.2* 3.8 4.7 4.8  CL 102  --   < > 99 99 100 98 98  CO2 26  --   < > GLUCOSE 194*  --   < > 200* 190* 273* 208* 253*  BUN 15  --   < > CREATININE 0.66  --   < > 0.70 0.61 0.55 0.59 0.61  CALCIUM 7.9*  --   < > 7.3* 7.4* 7.5* 7.4* 7.9*  MG  --   --   --  1.7 1.9 1.6 1.6  --   AST 21 19  --   --   --  14 15  --   ALT 17 17  --   --   --  10 12  --   ALKPHOS 55 53  --   --   --  50 56  --   BILITOT 0.8 1.0  --   --   --  0.5 0.5  --   < > = values in this interval not displayed. ------------------------------------------------------------------------------------------------------------------ estimated creatinine clearance is 75.6 mL/min (by C-G formula based on Cr of 0.61). ------------------------------------------------------------------------------------------------------------------ No results for input(s): HGBA1C in the last 72 hours. ------------------------------------------------------------------------------------------------------------------  Recent Labs  02/15/15 0420 02/16/15 0600  TRIG 304* 310*   ------------------------------------------------------------------------------------------------------------------ No results for input(s): TSH, T4TOTAL, T3FREE, THYROIDAB in the last 72 hours.  Invalid input(s): FREET3 ------------------------------------------------------------------------------------------------------------------ No results for  input(s): VITAMINB12, FOLATE, FERRITIN, TIBC, IRON, RETICCTPCT in the last 72 hours.  Coagulation profile No results for input(s): INR, PROTIME in the last 168 hours.  No results for input(s): DDIMER in the last 72 hours.  Cardiac Enzymes  Recent Labs Lab 02/11/15 1830 02/11/15 2255 02/12/15 0438  CKMB 11.4*  --   --   TROPONINI 0.04* 0.04* <0.03    ------------------------------------------------------------------------------------------------------------------ Invalid input(s): POCBNP     Time Spent in minutes   35 minutes   ELGERGAWY, DAWOOD M.D on 02/17/2015 at 1:36 PM  Between 7am to 7pm - Pager - [phone]  After 7pm go to [email or web address] - password [email or web address]  And look for the night coverage person covering for me after hours  [facility] Office  [phone]   **Disclaimer: This note may have been dictated with voice recognition software. Similar sounding words can inadvertently be transcribed and this note may contain transcription errors which may not have been corrected upon publication of note.**

## 2015-02-17 NOTE — Progress Notes (Signed)
Patient Name: Natasha Martin Date of Encounter: 02/17/2015     Active Problems:   Atrial fibrillation with RVR   GI bleed   Abdominal distention   Noninfectious gastroenteritis and colitis   Diabetes mellitus type 2 in nonobese   Acute encephalopathy   Ileus   Acute ischemic colitis   Atrial flutter with rapid ventricular response    SUBJECTIVE  Blood pressure is still soft.  IV diltiazem was stopped during the night.  Patient denies any cardiac complaints today.  She denies any chest pain or shortness of breath or dizziness.  Her rhythm remains atrial flutter with rapid ventricular response.  She is tolerating her low blood pressure without complaints of dizziness.  CURRENT MEDS . carbamazepine  400 mg Oral BID  . famotidine  20 mg Oral Daily  . feeding supplement (GLUCERNA SHAKE)  237 mL Oral BID BM  . haloperidol  10 mg Oral BID  . heparin subcutaneous  5,000 Units Subcutaneous 3 times per day  . insulin aspart  0-15 Units Subcutaneous TID WC  . insulin glargine  20 Units Subcutaneous Daily  . metoprolol tartrate  25 mg Oral QID  . QUEtiapine  800 mg Oral QHS  . traZODone  50 mg Oral QHS    OBJECTIVE  Filed Vitals:   02/17/15 0100 02/17/15 0300 02/17/15 0400 02/17/15 0600  BP: 102/77  Pulse: 94 145 111 117  Temp:   97.6 F (36.4 C)   TempSrc:   Axillary   Resp: Height:      Weight:      SpO2: 98% 97% 99% 100%    Intake/Output Summary (Last 24 hours) at 02/17/15 0745 Last data filed at 02/17/15 0700  Gross per 24 hour  Intake 2430.75 ml  Output   1600 ml  Net 830.75 ml   Filed Weights   02/14/15 0400 02/15/15 0527 02/16/15 0400  Weight: 162 lb 11.2 oz (73.8 kg) 162 lb 14.7 oz (73.9 kg) 162 lb 7.7 oz (73.7 kg)    PHYSICAL EXAM    General: Pleasant, NAD. Speech impediment. Difficult to understand at times. Neuro: Alert and oriented X 3. Moves all extremities spontaneously. Psych: Normal affect. HEENT:  Normal Neck: Supple without bruits or JVD. Lungs: Resp regular and unlabored, CTA. Heart: Irregularly irregular no s3, s4, or murmurs. Abdomen: Soft, non-tender, non-distended, soft bowel sounds. Extremities: No clubbing, cyanosis or edema. DP/PT/Radials 2+ and equal bilaterally.cessory Clinical Findings  CBC  Recent Labs  02/15/15 0420  02/16/15 0600 02/17/15 0415  WBC 5.9  --  12.3* 6.9  NEUTROABS 3.7  --  10.2*  --   HGB 9.8*  < > 9.1* 8.9*  HCT 28.0*  < > 26.6* 26.2*  MCV 91.8  --  91.7 91.9  PLT 197  --  198 204  < > = values in this interval not displayed. Basic Metabolic Panel  Recent Labs  02/15/15 0420 02/16/15 0600 02/17/15 0415  NA 132* 125* 129*  K 3.8 4.7 4.8  CL 100 98 98  CO2 GLUCOSE 273* 208* 253*  BUN CREATININE 0.55 0.59 0.61  CALCIUM 7.5* 7.4* 7.9*  MG 1.6 1.6  --   PHOS 2.0* 2.6  --    Liver Function Tests  Recent Labs  02/15/15 0420 02/16/15 0600  AST 14 15  ALT 10 12  ALKPHOS 50 56  BILITOT 0.5 0.5  PROT 5.0* 5.0*  ALBUMIN 2.4* 2.5*   No results for input(s): LIPASE, AMYLASE in the last 72 hours. Cardiac Enzymes No results for input(s): CKTOTAL, CKMB, CKMBINDEX, TROPONINI in the last 72 hours. BNP Invalid input(s): POCBNP D-Dimer No results for input(s): DDIMER in the last 72 hours. Hemoglobin A1C No results for input(s): HGBA1C in the last 72 hours. Fasting Lipid Panel  Recent Labs  02/16/15 0600  TRIG 310*   Thyroid Function Tests No results for input(s): TSH, T4TOTAL, T3FREE, THYROIDAB in the last 72 hours.  Invalid input(s): FREET3  TELE  Atrial flutter with rapid ventricular response  ECG    Radiology/Studies  Ct Head Wo Contrast  02/11/2015   CLINICAL DATA:  Altered mental status today.  EXAM: CT HEAD WITHOUT CONTRAST  TECHNIQUE: Contiguous axial images were obtained from the base of the skull through the vertex without intravenous contrast.  COMPARISON:  02/09/2015  FINDINGS: The  ventricles are normal in size and configuration. No extra-axial fluid collections are identified. The gray-white differentiation is normal. No CT findings for acute intracranial process such as hemorrhage or infarction. No mass lesions. The brainstem and cerebellum are grossly normal.  The bony structures are intact. The paranasal sinuses and mastoid air cells are clear. The globes are intact.  IMPRESSION: Normal head CT.  No change since 2 days ago.   Electronically Signed   By: Rudie MeyerP.  Gallerani M.D.   On: 02/11/2015 21:44   Ct Angio Chest Pe W/cm &/or Wo Cm  02/12/2015   CLINICAL DATA:  Tachycardia with elevated D-dimer.  EXAM: CT ANGIOGRAPHY CHEST WITH CONTRAST  TECHNIQUE: Multidetector CT imaging of the chest was performed using the standard protocol during bolus administration of intravenous contrast. Multiplanar CT image reconstructions and MIPs were obtained to evaluate the vascular anatomy.  CONTRAST:  100mL OMNIPAQUE IOHEXOL 350 MG/ML SOLN  COMPARISON:  Chest x-ray 02/11/2015 and CT abdomen 02/11/2015  FINDINGS: The lungs are adequately inflated with minimal linear scarring over the left lower lobe. There is a tiny amount of right pleural fluid and associated atelectasis. Airways are within normal.  There is mild prominence of the heart. There is a sub cm lymph node over the left pericardial phrenic angle. Pulmonary arterial system is normal without evidence of emboli. Right-sided aortic arch is present with aberrant left subclavian artery coursing posterior to the esophagus. Nasogastric tube is present. There is no mediastinal, hilar or axillary adenopathy.  Images through the upper abdomen demonstrate minimal diffuse hepatic steatosis. Tiny amount of perihepatic fluid unchanged. Remainder of the exam is unchanged.  Review of the MIP images confirms the above findings.  IMPRESSION: No evidence pulmonary embolism.  Tiny amount of right pleural fluid minimal right basilar atelectasis. Linear scarring left  base.  Mild cardiomegaly. Right-sided aortic arch with aberrant left subclavian artery.   Electronically Signed   By: Elberta Fortisaniel  Boyle M.D.   On: 02/12/2015 17:25   Ct Abdomen Pelvis W Contrast  02/11/2015   CLINICAL DATA:  Abdominal pain and distention, decreased bowel sounds.  EXAM: CT ABDOMEN AND PELVIS WITH CONTRAST  TECHNIQUE: Multidetector CT imaging of the abdomen and pelvis was performed using the standard protocol following bolus administration of intravenous contrast.  CONTRAST:  100mL OMNIPAQUE IOHEXOL 300 MG/ML  SOLN  COMPARISON:  Radiographs 1 day prior, CT abdomen 2 days prior.  FINDINGS: Bibasilar atelectasis, minimal pleural thickening dependently.  Enteric tube in place, tip in the duodenum. There is fluid distending the stomach. Diffusely fluid-filled small bowel with small bowel dilatation, measuring up  to 4.9 cm. No definite transition point, the distal small bowel loops are decompressed. There is diffuse colonic wall thickening with surrounding perienteric inflammatory change from the mid transverse colon to the rectum. The distal colon is fluid-filled. The more proximal colon is normal in caliber with small volume of stool. The appendix is normal.  No free air or pneumatosis. Small amount of free fluid in the perisplenic space and pelvis is likely reactive. The mesenteric vessels appear patent.  No focal hepatic lesion. There is an elongated left hepatic lobe. The gallbladder is physiologically distended. The spleen and pancreas are unremarkable. Unchanged adrenal nodularity. Kidneys demonstrate symmetric enhancement and excretion. No hydronephrosis or perinephric stranding.  Abdominal aorta is normal in caliber, minimal atherosclerosis. No retroperitoneal adenopathy. Mild whole body wall edema.  Within the pelvis the urinary bladder is decompressed by Foley catheter. Uterus remains in situ, atrophic, normal for age. No adnexal mass.  There are no acute or suspicious osseous abnormalities.  Remote lower left rib fractures.  IMPRESSION: 1. Colitis extending from the mid transverse colon through the rectum. Findings may be infectious or inflammatory. There is no perforation or abscess. 2. Dilated and fluid-filled small bowel loops in the central and lower abdomen. This may be reactive ileus versus small bowel enteritis. 3. Small amount of free fluid in the pelvis and perisplenic space.   Electronically Signed   By: Rubye Oaks M.D.   On: 02/11/2015 21:53   Dg Chest Port 1 View  02/11/2015   CLINICAL DATA:  Central catheter placement  EXAM: PORTABLE CHEST - 1 VIEW  COMPARISON:  February 10, 2015  FINDINGS: Central catheter tip is at the cavoatrial junction. Nasogastric tube tip and side port are in the distal stomach. No pneumothorax. There is no edema or consolidation. Heart is upper normal in size with pulmonary vascularity within normal limits. No adenopathy.  IMPRESSION: Tube and catheter positions as described without pneumothorax. No edema or consolidation.   Electronically Signed   By: Bretta Bang III M.D.   On: 02/11/2015 17:33   Dg Abd Acute W/chest  02/13/2015   CLINICAL DATA:  Altered mental status. Abdominal distention. Colitis.  EXAM: DG ABDOMEN ACUTE W/ 1V CHEST  COMPARISON:  CT examinations 414 and 02/11/2015  FINDINGS: Upright chest x-ray: The right IJ central venous catheter tip is in distal SVC near the cavoatrial junction. This is stable. The heart is normal in size. Low lung volumes with vascular crowding and bibasilar atelectasis. The NG tube is stable.  Two views of the abdomen demonstrate diffusely distended small bowel loops and right colon. Suspect functional obstruction due to severe colitis beginning in the mid transverse colon  IMPRESSION: Persistent dilated small bowel and right colon likely due to a functional obstruction related to severe colitis beginning in the mid transverse colon. No definite free air.  Stable bibasilar atelectasis.   Electronically Signed    By: Rudie Meyer M.D.   On: 02/13/2015 15:33    ASSESSMENT AND PLAN  1. Newly documented atrial flutter with RVR, duration uncertain.. Currently not anticoagulated with GI bleeding and suspected ischemic colitis. Therefore no plans to electrically cardiovert. CHADSVASC score is 3. She is now taking PO's.  2. Colitis, infectious versus ischemic. She is being managed by gastroenterology.  3. Ileus, improved.  4. History of GI bleeding.  5. History of schizophrenia and developmental mental delays.  6. Hyponatremia  Plan: Continue oral metoprolol. Switch amiodarone to oral route. Add low dose digoxin for additional rate control.  Signed, Darlin Coco MD

## 2015-02-17 NOTE — Progress Notes (Signed)
Pt BP soft during the night.  Cardizem turned off about 1 am per parameters.  [facility] NP aware with orders received to D/C Cardizem.  HR has been in the 110-130s and is still on Amiodarone. No other orders received at present.

## 2015-02-17 NOTE — Progress Notes (Signed)
CSW continuing to follow.   [facility] [place], [place]. Per unit progression meeting, pt will hopeful be able to transition to amiodarone and cardizam by mouth. CSW notified RN of need for PT consult in order to determine if pt needs a higher level of care than group home as pt was ambulatory prior to admission.   CSW received phone call from pt sister, [place] Co[place]. CSW provided update to pt sister and discussed that PT will be order in order to determine if pt needs rehab at SNF prior to returning to group home. Pt sister in agreement to rehab at SNF if that is recommendation.   CSW to follow up on PT recommendations in order to appropriately assist with pt disposition needs.   Loletta SpecterSuzanna Rondarius Kadrmas, MSW, LCSW Clinical Social Work [phone]

## 2015-02-18 DIAGNOSIS — E871 Hypo-osmolality and hyponatremia: Secondary | ICD-10-CM

## 2015-02-18 DIAGNOSIS — F209 Schizophrenia, unspecified: Secondary | ICD-10-CM

## 2015-02-18 DIAGNOSIS — I4891 Unspecified atrial fibrillation: Secondary | ICD-10-CM | POA: Insufficient documentation

## 2015-02-18 DIAGNOSIS — I481 Persistent atrial fibrillation: Secondary | ICD-10-CM

## 2015-02-18 DIAGNOSIS — R7989 Other specified abnormal findings of blood chemistry: Secondary | ICD-10-CM | POA: Diagnosis present

## 2015-02-18 DIAGNOSIS — E876 Hypokalemia: Secondary | ICD-10-CM | POA: Diagnosis present

## 2015-02-18 LAB — GLUCOSE, CAPILLARY
GLUCOSE-CAPILLARY: 259 mg/dL — AB (ref 70–99)
Glucose-Capillary: 191 mg/dL — ABNORMAL HIGH (ref 70–99)
Glucose-Capillary: 335 mg/dL — ABNORMAL HIGH (ref 70–99)
Glucose-Capillary: 292 mg/dL — ABNORMAL HIGH (ref 70–99)

## 2015-02-18 LAB — BASIC METABOLIC PANEL
Anion gap: 8 (ref 5–15)
BUN: 8 mg/dL (ref 6–23)
CHLORIDE: 96 mmol/L (ref 96–112)
CO2: 26 mmol/L (ref 19–32)
Calcium: 7.9 mg/dL — ABNORMAL LOW (ref 8.4–10.5)
Creatinine, Ser: 0.63 mg/dL (ref 0.50–1.10)
GFR calc non Af Amer: >90 mL/min (ref >90)
GLUCOSE: 162 mg/dL — AB (ref 70–99)
POTASSIUM: 4.2 mmol/L (ref 3.5–5.1)
Sodium: 130 mmol/L — ABNORMAL LOW (ref 135–145)

## 2015-02-18 LAB — CBC
HCT: 28 % — ABNORMAL LOW (ref 36.0–46.0)
Hemoglobin: 9.6 g/dL — ABNORMAL LOW (ref 12.0–15.0)
MCH: 31.3 pg (ref 26.0–34.0)
MCHC: 34.3 g/dL (ref 30.0–36.0)
MCV: 91.2 fL (ref 78.0–100.0)
PLATELETS: 214 10*3/uL (ref 150–400)
RBC: 3.07 MIL/uL — ABNORMAL LOW (ref 3.87–5.11)
RDW: 13.4 % (ref 11.5–15.5)
WBC: 6.1 10*3/uL (ref 4.0–10.5)

## 2015-02-18 MED ORDER — METOPROLOL SUCCINATE ER 25 MG PO TB24
75.0000 mg | ORAL_TABLET | Freq: Two times a day (BID) | ORAL | Status: DC
  Administered 2015-02-18 (×2): 75 mg via ORAL
  Filled 2015-02-18 (×2): qty 3

## 2015-02-18 MED ORDER — METOPROLOL TARTRATE 1 MG/ML IV SOLN
5.0000 mg | Freq: Four times a day (QID) | INTRAVENOUS | Status: DC | PRN
  Administered 2015-02-18 – 2015-02-19 (×3): 5 mg via INTRAVENOUS
  Filled 2015-02-18 (×3): qty 5

## 2015-02-18 NOTE — Evaluation (Signed)
Physical Therapy Evaluation Patient Details Name: Natasha Martin MRN: [id number] DOB: 1961/07/03 Today's Date: 02/18/2015   History of Present Illness  54 yo female with  MR, resident of group home sent  to [facility] 02/09/15 with abdominal pain/colitis, noted to have melena stools and onset of afib with RVRand  transferred to [facility] on 02/11/15. also found to have SB ileus.  Clinical Impression  Patient's HR at rest 120, RN said OK to walk a short distance. HR noted to rise to 150, RR 30, dyspnea 3/4. Assisted back to bed. Patient will benefit from PT to address problems listed in note below.    Follow Up Recommendations SNF;Home health PT (depends on facility's ability to care for patient at DC.)    Equipment Recommendations  Rolling walker with 5" wheels (TBD)    Recommendations for Other Services       Precautions / Restrictions Precautions Precautions: Fall Precaution Comments: monitor HR      Mobility  Bed Mobility Overal bed mobility: Needs Assistance Bed Mobility: Sit to Supine       Sit to supine: Supervision      Transfers Overall transfer level: Needs assistance Equipment used: Rolling walker (2 wheeled) Transfers: Stand Pivot Transfers Sit to Stand: Min assist Stand pivot transfers: Min assist       General transfer comment: cues for safety, impusivity, frequent cues  for turns  and slowing down, HHA to take several steps  to get to bed from recliner.  Ambulation/Gait Ambulation/Gait assistance: Min assist;+2 safety/equipment Ambulation Distance (Feet): 60 Feet Assistive device: Rolling walker (2 wheeled) Gait Pattern/deviations: Step-through pattern     General Gait Details: multimodal cues for safet with use of RW, patient wantwed  to walk, required repeated  explanation as to  her HR was limiting activity. patient noted to be SOB, increased  RR 30's, HR  125- 150 , RN gave OK to walk a short distance as patient was getting antsy about  walking.  Stairs            Wheelchair Mobility    Modified Rankin (Stroke Patients Only)       Balance Overall balance assessment: Needs assistance Sitting-balance support: Feet supported;No upper extremity supported Sitting balance-Leahy Scale: Normal     Standing balance support: During functional activity;No upper extremity supported Standing balance-Leahy Scale: Fair                               Pertinent Vitals/Pain Pain Assessment: No/denies pain    Home Living Family/patient expects to be discharged to:: Assisted living               Home Equipment: None      Prior Function           Comments: not sure     Hand Dominance        Extremity/Trunk Assessment   Upper Extremity Assessment: Overall WFL for tasks assessed           Lower Extremity Assessment: Overall WFL for tasks assessed      Cervical / Trunk Assessment: Normal  Communication   Communication: No difficulties  Cognition Arousal/Alertness: Awake/alert Behavior During Therapy: Impulsive;Restless Overall Cognitive Status: History of cognitive impairments - at baseline       Memory: Decreased recall of precautions [facility] for following instruction and participation)              General Comments  Exercises        Assessment/Plan    PT Assessment Patient needs continued PT services  PT Diagnosis Difficulty walking;Altered mental status   PT Problem List Decreased strength;Decreased mobility;Decreased knowledge of use of DME;Decreased safety awareness;Decreased knowledge of precautions;Cardiopulmonary status limiting activity  PT Treatment Interventions DME instruction;Functional mobility training;Therapeutic activities;Patient/family education   PT Goals (Current goals can be found in the Care Plan section) Acute Rehab PT Goals Patient Stated Goal: to get a scooter PT Goal Formulation: With family Time For Goal Achievement:  03/04/15 Potential to Achieve Goals: Good    Frequency Min 3X/week   Barriers to discharge        Co-evaluation               End of Session Equipment Utilized During Treatment: Gait belt Activity Tolerance: Treatment limited secondary to medical complications (Comment) (HR and DOE) Patient left: in bed;with call bell/phone within reach;with bed alarm set Nurse Communication: Mobility status         Time: [id number] PT Time Calculation (min) (ACUTE ONLY): 15 min   Charges:   PT Evaluation $Initial PT Evaluation Tier I: 1 Procedure     PT G CodesRada Martin:        Natasha Martin 02/18/2015, 12:59 PM Natasha Martin PT [phone]

## 2015-02-18 NOTE — Progress Notes (Signed)
Patient Name: Natasha Martin Date of Encounter: 02/18/2015     Active Problems:   Atrial fibrillation with RVR   GI bleed   Abdominal distention   Noninfectious gastroenteritis and colitis   Diabetes mellitus type 2 in nonobese   Acute encephalopathy   Ileus   Acute ischemic colitis   Atrial flutter with rapid ventricular response    SUBJECTIVE  The patient denies any chest pain or shortness of breath.  She wants to increase activity.  She remains in atrial flutter with ventricular response 125 bpm.  Her blood pressure is low in the mornings secondary to massive doses of antipsychotic medications at bedtime.  During the day her blood pressure typically rises.  CURRENT MEDS . amiodarone  400 mg Oral BID  . carbamazepine  400 mg Oral BID  . digoxin  0.125 mg Oral Daily  . famotidine  20 mg Oral Daily  . feeding supplement (GLUCERNA SHAKE)  237 mL Oral BID BM  . haloperidol  10 mg Oral BID  . heparin subcutaneous  5,000 Units Subcutaneous 3 times per day  . insulin aspart  0-15 Units Subcutaneous TID WC  . insulin glargine  30 Units Subcutaneous Daily  . metoprolol tartrate  25 mg Oral QID  . nystatin   Topical BID  . QUEtiapine  600 mg Oral QHS  . traZODone  50 mg Oral QHS    OBJECTIVE  Filed Vitals:   02/18/15 0000 02/18/15 0200 02/18/15 0400 02/18/15 0500  BP: 82/62  90/32   Pulse: 117 118 125 123  Temp: 99 F (37.2 C)   98.4 F (36.9 C)  TempSrc: Oral     Resp: 20 15 13 14   Height:      Weight:      SpO2: 99% 100% 100% 100%    Intake/Output Summary (Last 24 hours) at 02/18/15 0719 Last data filed at 02/17/15 2200  Gross per 24 hour  Intake 1393.64 ml  Output   2860 ml  Net -1466.36 ml   Filed Weights   02/15/15 0527 02/16/15 0400 02/17/15 0700  Weight: 162 lb 14.7 oz (73.9 kg) 162 lb 7.7 oz (73.7 kg) 162 lb 7.7 oz (73.7 kg)    PHYSICAL EXAM    General: Pleasant, NAD. Speech impediment. Difficult to understand at times. Neuro: Alert and  oriented X 3. Moves all extremities spontaneously. Psych: Normal affect. HEENT: Normal Neck: Supple without bruits or JVD. Lungs: Resp regular and unlabored, CTA. Heart: Irregularly irregular no s3, s4, or murmurs. Abdomen: Soft, non-tender, non-distended, soft bowel sounds. Extremities: No clubbing, cyanosis or edema. DP/PT/Radials 2+ and equal bilaterally.cessory Clinical Findings  CBC  Recent Labs  02/16/15 0600 02/17/15 0415 02/18/15 0515  WBC 12.3* 6.9 6.1  NEUTROABS 10.2*  --   --   HGB 9.1* 8.9* 9.6*  HCT 26.6* 26.2* 28.0*  MCV 91.7 91.9 91.2  PLT 198 204 214   Basic Metabolic Panel  Recent Labs  02/16/15 0600 02/17/15 0415 02/18/15 0515  NA 125* 129* 130*  K 4.7 4.8 4.2  CL 98 98 96  CO2 23 25 26   GLUCOSE 208* 253* 162*  BUN 7 7 8   CREATININE 0.59 0.61 0.63  CALCIUM 7.4* 7.9* 7.9*  MG 1.6  --   --   PHOS 2.6  --   --    Liver Function Tests  Recent Labs  02/16/15 0600  AST 15  ALT 12  ALKPHOS 56  BILITOT 0.5  PROT 5.0*  ALBUMIN  2.5*   No results for input(s): LIPASE, AMYLASE in the last 72 hours. Cardiac Enzymes No results for input(s): CKTOTAL, CKMB, CKMBINDEX, TROPONINI in the last 72 hours. BNP Invalid input(s): POCBNP D-Dimer No results for input(s): DDIMER in the last 72 hours. Hemoglobin A1C No results for input(s): HGBA1C in the last 72 hours. Fasting Lipid Panel  Recent Labs  02/16/15 0600  TRIG 310*   Thyroid Function Tests No results for input(s): TSH, T4TOTAL, T3FREE, THYROIDAB in the last 72 hours.  Invalid input(s): FREET3  TELE  Atrial flutter with rapid ventricular response  ECG    Radiology/Studies  Ct Head Wo Contrast  02/11/2015   CLINICAL DATA:  Altered mental status today.  EXAM: CT HEAD WITHOUT CONTRAST  TECHNIQUE: Contiguous axial images were obtained from the base of the skull through the vertex without intravenous contrast.  COMPARISON:  02/09/2015  FINDINGS: The ventricles are normal in  size and configuration. No extra-axial fluid collections are identified. The gray-white differentiation is normal. No CT findings for acute intracranial process such as hemorrhage or infarction. No mass lesions. The brainstem and cerebellum are grossly normal.  The bony structures are intact. The paranasal sinuses and mastoid air cells are clear. The globes are intact.  IMPRESSION: Normal head CT.  No change since 2 days ago.   Electronically Signed   By: Rudie MeyerP.  Gallerani M.D.   On: 02/11/2015 21:44   Ct Angio Chest Pe W/cm &/or Wo Cm  02/12/2015   CLINICAL DATA:  Tachycardia with elevated D-dimer.  EXAM: CT ANGIOGRAPHY CHEST WITH CONTRAST  TECHNIQUE: Multidetector CT imaging of the chest was performed using the standard protocol during bolus administration of intravenous contrast. Multiplanar CT image reconstructions and MIPs were obtained to evaluate the vascular anatomy.  CONTRAST:  100mL OMNIPAQUE IOHEXOL 350 MG/ML SOLN  COMPARISON:  Chest x-ray 02/11/2015 and CT abdomen 02/11/2015  FINDINGS: The lungs are adequately inflated with minimal linear scarring over the left lower lobe. There is a tiny amount of right pleural fluid and associated atelectasis. Airways are within normal.  There is mild prominence of the heart. There is a sub cm lymph node over the left pericardial phrenic angle. Pulmonary arterial system is normal without evidence of emboli. Right-sided aortic arch is present with aberrant left subclavian artery coursing posterior to the esophagus. Nasogastric tube is present. There is no mediastinal, hilar or axillary adenopathy.  Images through the upper abdomen demonstrate minimal diffuse hepatic steatosis. Tiny amount of perihepatic fluid unchanged. Remainder of the exam is unchanged.  Review of the MIP images confirms the above findings.  IMPRESSION: No evidence pulmonary embolism.  Tiny amount of right pleural fluid minimal right basilar atelectasis. Linear scarring left base.  Mild cardiomegaly.  Right-sided aortic arch with aberrant left subclavian artery.   Electronically Signed   By: Elberta Fortisaniel  Boyle M.D.   On: 02/12/2015 17:25   Ct Abdomen Pelvis W Contrast  02/11/2015   CLINICAL DATA:  Abdominal pain and distention, decreased bowel sounds.  EXAM: CT ABDOMEN AND PELVIS WITH CONTRAST  TECHNIQUE: Multidetector CT imaging of the abdomen and pelvis was performed using the standard protocol following bolus administration of intravenous contrast.  CONTRAST:  100mL OMNIPAQUE IOHEXOL 300 MG/ML  SOLN  COMPARISON:  Radiographs 1 day prior, CT abdomen 2 days prior.  FINDINGS: Bibasilar atelectasis, minimal pleural thickening dependently.  Enteric tube in place, tip in the duodenum. There is fluid distending the stomach. Diffusely fluid-filled small bowel with small bowel dilatation, measuring up to 4.9  cm. No definite transition point, the distal small bowel loops are decompressed. There is diffuse colonic wall thickening with surrounding perienteric inflammatory change from the mid transverse colon to the rectum. The distal colon is fluid-filled. The more proximal colon is normal in caliber with small volume of stool. The appendix is normal.  No free air or pneumatosis. Small amount of free fluid in the perisplenic space and pelvis is likely reactive. The mesenteric vessels appear patent.  No focal hepatic lesion. There is an elongated left hepatic lobe. The gallbladder is physiologically distended. The spleen and pancreas are unremarkable. Unchanged adrenal nodularity. Kidneys demonstrate symmetric enhancement and excretion. No hydronephrosis or perinephric stranding.  Abdominal aorta is normal in caliber, minimal atherosclerosis. No retroperitoneal adenopathy. Mild whole body wall edema.  Within the pelvis the urinary bladder is decompressed by Foley catheter. Uterus remains in situ, atrophic, normal for age. No adnexal mass.  There are no acute or suspicious osseous abnormalities. Remote lower left rib  fractures.  IMPRESSION: 1. Colitis extending from the mid transverse colon through the rectum. Findings may be infectious or inflammatory. There is no perforation or abscess. 2. Dilated and fluid-filled small bowel loops in the central and lower abdomen. This may be reactive ileus versus small bowel enteritis. 3. Small amount of free fluid in the pelvis and perisplenic space.   Electronically Signed   By: Rubye Oaks M.D.   On: 02/11/2015 21:53   Dg Chest Port 1 View  02/11/2015   CLINICAL DATA:  Central catheter placement  EXAM: PORTABLE CHEST - 1 VIEW  COMPARISON:  February 10, 2015  FINDINGS: Central catheter tip is at the cavoatrial junction. Nasogastric tube tip and side port are in the distal stomach. No pneumothorax. There is no edema or consolidation. Heart is upper normal in size with pulmonary vascularity within normal limits. No adenopathy.  IMPRESSION: Tube and catheter positions as described without pneumothorax. No edema or consolidation.   Electronically Signed   By: Bretta Bang III M.D.   On: 02/11/2015 17:33   Dg Abd Acute W/chest  02/13/2015   CLINICAL DATA:  Altered mental status. Abdominal distention. Colitis.  EXAM: DG ABDOMEN ACUTE W/ 1V CHEST  COMPARISON:  CT examinations 414 and 02/11/2015  FINDINGS: Upright chest x-ray: The right IJ central venous catheter tip is in distal SVC near the cavoatrial junction. This is stable. The heart is normal in size. Low lung volumes with vascular crowding and bibasilar atelectasis. The NG tube is stable.  Two views of the abdomen demonstrate diffusely distended small bowel loops and right colon. Suspect functional obstruction due to severe colitis beginning in the mid transverse colon  IMPRESSION: Persistent dilated small bowel and right colon likely due to a functional obstruction related to severe colitis beginning in the mid transverse colon. No definite free air.  Stable bibasilar atelectasis.   Electronically Signed   By: Rudie Meyer  M.D.   On: 02/13/2015 15:33    ASSESSMENT AND PLAN  1. Newly documented atrial flutter with RVR, duration uncertain.. Currently not anticoagulated with GI bleeding and suspected ischemic colitis. Therefore no plans to electrically cardiovert. CHADSVASC score is 3. She is now taking PO's.  Ejection fraction is normal at 60-65% by echo  2. Colitis, infectious versus ischemic. She is being managed by gastroenterology.  Rectal bleeding appears to have stopped  3. Ileus, improved.  4. History of GI bleeding.  5. History of schizophrenia and developmental mental delays.  6. Hyponatremia  Plan: Continue rate  control with digoxin and metoprolol.  Will increase metoprolol further.  Continue amiodarone loading. Okay from cardiac standpoint to increase activity.  Signed, Cassell Clement MD

## 2015-02-18 NOTE — Progress Notes (Signed)
PT Cancellation Note  Patient Details Name: Natasha Martin MRN: [id number] DOB: [birth year]   Cancelled Treatment:    Reason Eval/Treat Not Completed: Medical issues which prohibited therapy (ready to assist OOB, HR sustaining in 140's. will wait for lower HR per RN request. )will check back later after medications given and HR loweres.    [name], [name] 02/18/2015, 9:35 AM [name] PT [phone]

## 2015-02-18 NOTE — Progress Notes (Addendum)
Progress Note   Natasha Martin UXL:[id number] DOB: [birth year] DOA: 02/11/2015 PCP: PROVIDER NOT IN SYSTEM   Brief Narrative:   Natasha Martin is an 54 y.o. female with PMH of developmental delay, seizures, schizophrenia, hypertension and diabetes who was admitted to an [facility] 02/08/15 with altered mental status.  CT abdomen pelvis without contrast was done for distended abdomen (without contrast given elevated creatinine of 1.9), and showed wall thickening from the mid transverse to descending colon. Her hospital course was complicated by atrial fibrillation with RVR and she was transferred to [facility] on 02/11/15. She is currently being treated for ischemic colitis and cardiology is following for her A. fib with RVR.  Assessment/Plan:   Principal Problem:   Acute ischemic colitis / GI bleed / abdominal distention / noninfectious gastroenteritis and colitis - Thought to be ischemic. Managed IV Zosyn, which was discontinued 02/16/15 after 6 days of therapy.  - GI evaluated and signed off 02/15/15 with recommendations for outpatient GI follow-up with Dr. Jonette Eva.  - Resolving, diet advanced to soft foods, which she is tolerating. - Hemoglobin stable.  Active Problems:   Hyponatremia secondary to SIADH - Sodium stable at 130. Continue fluid restriction.    Atrial fibrillation/flutter with RVR - New onset. TSH WNL. - Managed by cardiology: initially on Cardizem and amiodarone drip.   Transitioned to oral therapy 02/17/15: Amiodarone, digoxin and metoprolol. - No anti-coagulation currently given her GI bleeding. Cardioversion contraindicated due to lack of anticoagulation. - 2-D echo showed EF 60-65%. HR 117-125. - Metoprolol dose adjusted by cardiology, added IV metoprolol to use as needed for heart rate greater than 120. - Continue to monitor on telemetry given her risk of QTC prolongation with amiodarone and antipsychotic medications.    Acute encephalopathy - Most likely  metabolic in the setting of hyponatremia, intra-abdominal process, with possible polypharmacy. - CT head without contrast 02/11/15 negative for acute findings. - EEG negative for seizures, consistent with encephalopathy. - Resolved,now at baseline per family.    Ileus - Initially managed with NG tube/gastric decompression. Diet subsequently advanced.    Hypokalemia/hypophosphatemia - Electrolytes being monitored and replaced as needed.    Elevated d-dimer - VTE ruled out with CT chest and lower extremity Dopplers.    Schizophrenia  - Usual home medications including Seroquel, carbamazepine, Haldol and trazodone resumed.  Invega on hold. - Family advised of the potential interaction of amiodarone with her psychiatry medications including Haldol and risk of cardiac arrhythmias with this combination.    Poorly controlled type 2 diabetes  - Currently being managed with Lantus 30 units daily and moderate scale SSI 3 times a day. CBGs 158-354. - Glyburide and Amaryl on hold.    DVT Prophylaxis - Continue SCDs and heparin.  Code Status: Full. Family Communication: Mother Natasha Martin), and niece Natasha Martin) updated at the bedside. Disposition Plan: From a group home, likely to return there in 1-2 days if HR stable.   IV Access:    Peripheral IV   Procedures and diagnostic studies:   Ct Head Wo Contrast 02/11/2015: Normal head CT.  No change since 2 days ago.     Ct Abdomen Pelvis W Contrast 02/11/2015: 1. Colitis extending from the mid transverse colon through the rectum. Findings may be infectious or inflammatory. There is no perforation or abscess. 2. Dilated and fluid-filled small bowel loops in the central and lower abdomen. This may be reactive ileus versus small bowel enteritis. 3. Small amount of free fluid  in the pelvis and perisplenic space.    Dg Chest Port 1 View 02/11/2015: Tube and catheter positions as described without pneumothorax. No edema or consolidation.    Ct Angio  Chest Pe W/cm &/or Wo Cm 02/12/2015: No evidence pulmonary embolism.  Tiny amount of right pleural fluid minimal right basilar atelectasis. Linear scarring left base.  Mild cardiomegaly. Right-sided aortic arch with aberrant left subclavian artery.    Dg Abd Acute W/chest 02/13/2015: Persistent dilated small bowel and right colon likely due to a functional obstruction related to severe colitis beginning in the mid transverse colon. No definite free air.  Stable bibasilar atelectasis.      Medical Consultants:    Dr. Claudette Head, GI  Dr. Verne Carrow, Cardiology  Dr. Ileene Rubens, Surgery  Dr. Max Fickle, PCCM  Anti-Infectives:    Flagyl 02/11/15---> 02/14/15  Zosyn 02/11/15---> 02/16/15  Subjective:   [name] says she is "eating", but continues to have some dry heaves. Has had 3 loose stools over the past 24 hours.  No dyspnea.  Objective:    Filed Vitals:   02/18/15 0000 02/18/15 0200 02/18/15 0400 02/18/15 0500  BP: 82/62  90/32   Pulse: 117 118 125 123  Temp: 99 F (37.2 C)   98.4 F (36.9 C)  TempSrc: Oral     Resp: Height:      Weight:      SpO2: 99% 100% 100% 100%    Intake/Output Summary (Last 24 hours) at 02/18/15 0732 Last data filed at 02/17/15 2200  Gross per 24 hour  Intake 1393.64 ml  Output   2860 ml  Net -1466.36 ml    Exam: Gen:  NAD Cardiovascular:  Tachy/reg with a few irregular beats Respiratory:  Lungs CTAB Gastrointestinal:  Abdomen soft,mildly tender to palpation, + BS Extremities:  No C/E/C   Data Reviewed:    Labs: Basic Metabolic Panel:  Recent Labs Lab 02/13/15 0528 02/14/15 0545 02/15/15 0420 02/16/15 0600 02/17/15 0415 02/18/15 0515  NA 135 137 132* 125* 129* 130*  K 2.9* 3.2* 3.8 4.7 4.8 4.2  CL 99 99 100 98 98 96  CO2 GLUCOSE 200* 190* 273* 208* 253* 162*  BUN CREATININE 0.70 0.61 0.55 0.59 0.61 0.63  CALCIUM 7.3* 7.4* 7.5* 7.4* 7.9* 7.9*  MG 1.7  1.9 1.6 1.6  --   --   PHOS  --  1.8* 2.0* 2.6  --   --    GFR Estimated Creatinine Clearance: 75.6 mL/min (by C-G formula based on Cr of 0.63). Liver Function Tests:  Recent Labs Lab 02/11/15 1525 02/11/15 1830 02/15/15 0420 02/16/15 0600  AST ALT ALKPHOS 55 53 50 56  BILITOT 0.8 1.0 0.5 0.5  PROT 6.0 5.5* 5.0* 5.0*  ALBUMIN 2.6* 2.5* 2.4* 2.5*    Recent Labs Lab 02/11/15 1830  AMMONIA 21   CBC:  Recent Labs Lab 02/13/15 0528  02/15/15 0420 02/15/15 1105 02/16/15 0600 02/17/15 0415 02/18/15 0515  WBC 3.9*  --  5.9  --  12.3* 6.9 6.1  NEUTROABS  --   --  3.7  --  10.2*  --   --   HGB 10.0*  < > 9.8* 10.3* 9.1* 8.9* 9.6*  HCT 28.3*  < > 28.0* 29.9* 26.6* 26.2* 28.0*  MCV 91.0  --  91.8  --  91.7 91.9 91.2  PLT 173  --  197  --  198 204 214  < > = values in this interval not displayed. Cardiac Enzymes:  Recent Labs Lab 02/11/15 1830 02/11/15 2255 02/12/15 0438  CKTOTAL 41  --   --   CKMB 11.4*  --   --   TROPONINI 0.04* 0.04* <0.03   CBG:  Recent Labs Lab 02/16/15 2339 02/17/15 0348 02/17/15 0754 02/17/15 1207 02/17/15 1532  GLUCAP 238* 252* 158* 354* 242*   Lipid Profile:  Recent Labs  02/16/15 0600  TRIG 310*   Sepsis Labs:  Recent Labs Lab 02/11/15 1525 02/11/15 1830 02/12/15 0438 02/13/15 0528 02/15/15 0420 02/16/15 0600 02/17/15 0415 02/18/15 0515  PROCALCITON 5.07  --  3.43 1.60  --   --   --   --   WBC 3.9*  --  4.0 3.9* 5.9 12.3* 6.9 6.1  LATICACIDVEN 1.7 0.9  --   --   --   --   --   --    Microbiology Recent Results (from the past 240 hour(s))  MRSA PCR Screening     Status: None   Collection Time: 02/11/15  8:12 PM  Result Value Ref Range Status   MRSA by PCR NEGATIVE NEGATIVE Final    Comment:        The GeneXpert MRSA Assay (FDA approved for NASAL specimens only), is one component of a comprehensive MRSA colonization surveillance program. It is not intended to diagnose  MRSA infection nor to guide or monitor treatment for MRSA infections.   Clostridium Difficile by PCR     Status: None   Collection Time: 02/17/15  9:47 PM  Result Value Ref Range Status   C difficile by pcr NEGATIVE NEGATIVE Final     Medications:   . amiodarone  400 mg Oral BID  . carbamazepine  400 mg Oral BID  . digoxin  0.125 mg Oral Daily  . famotidine  20 mg Oral Daily  . feeding supplement (GLUCERNA SHAKE)  237 mL Oral BID BM  . haloperidol  10 mg Oral BID  . heparin subcutaneous  5,000 Units Subcutaneous 3 times per day  . insulin aspart  0-15 Units Subcutaneous TID WC  . insulin glargine  30 Units Subcutaneous Daily  . metoprolol succinate  75 mg Oral BID AC  . nystatin   Topical BID  . QUEtiapine  600 mg Oral QHS  . traZODone  50 mg Oral QHS   Continuous Infusions:   Time spent: 35 minutes with > 50% of time discussing current diagnostic test results, clinical impression and plan of care with the patient and her family.   LOS: 7 days   Jenesa Foresta  [facility] Pager [phone]. If unable to reach me by pager, please call my cell phone at [phone].  *Please refer to [email or web address], password [email or web address] to get updated schedule on who will round on this patient, as hospitalists switch teams weekly. If 7PM-7AM, please contact night-coverage at [email or web address], password [email or web address] for any overnight needs.  02/18/2015, 7:32 AM

## 2015-02-19 LAB — GLUCOSE, CAPILLARY
GLUCOSE-CAPILLARY: 160 mg/dL — AB (ref 70–99)
GLUCOSE-CAPILLARY: 226 mg/dL — AB (ref 70–99)
Glucose-Capillary: 153 mg/dL — ABNORMAL HIGH (ref 70–99)
Glucose-Capillary: 297 mg/dL — ABNORMAL HIGH (ref 70–99)

## 2015-02-19 LAB — BASIC METABOLIC PANEL
Anion gap: 9 (ref 5–15)
BUN: 7 mg/dL (ref 6–23)
CALCIUM: 8 mg/dL — AB (ref 8.4–10.5)
CO2: 25 mmol/L (ref 19–32)
Chloride: 96 mmol/L (ref 96–112)
Creatinine, Ser: 0.61 mg/dL (ref 0.50–1.10)
Glucose, Bld: 130 mg/dL — ABNORMAL HIGH (ref 70–99)
Potassium: 4.2 mmol/L (ref 3.5–5.1)
SODIUM: 130 mmol/L — AB (ref 135–145)

## 2015-02-19 LAB — PROTIME-INR
INR: 0.99 (ref 0.00–1.49)
Prothrombin Time: 13.2 seconds (ref 11.6–15.2)

## 2015-02-19 MED ORDER — INSULIN GLARGINE 100 UNIT/ML ~~LOC~~ SOLN
40.0000 [IU] | Freq: Every day | SUBCUTANEOUS | Status: DC
  Filled 2015-02-19: qty 0.4

## 2015-02-19 MED ORDER — INSULIN ASPART 100 UNIT/ML ~~LOC~~ SOLN: 0.0000 [IU] | Freq: Every day | SUBCUTANEOUS | Status: DC

## 2015-02-19 MED ORDER — INSULIN ASPART 100 UNIT/ML ~~LOC~~ SOLN
4.0000 [IU] | Freq: Three times a day (TID) | SUBCUTANEOUS | Status: DC
Start: 2015-02-19 — End: 2015-02-20
  Administered 2015-02-19 – 2015-02-20 (×4): 4 [IU] via SUBCUTANEOUS

## 2015-02-19 MED ORDER — ENOXAPARIN SODIUM 80 MG/0.8ML ~~LOC~~ SOLN
1.0000 mg/kg | Freq: Two times a day (BID) | SUBCUTANEOUS | Status: DC
Start: 2015-02-20 — End: 2015-02-20
  Administered 2015-02-20: 75 mg via SUBCUTANEOUS
  Filled 2015-02-19 (×2): qty 0.8

## 2015-02-19 MED ORDER — WARFARIN - PHARMACIST DOSING INPATIENT: Freq: Every day | Status: DC

## 2015-02-19 MED ORDER — INSULIN ASPART 100 UNIT/ML ~~LOC~~ SOLN
0.0000 [IU] | Freq: Three times a day (TID) | SUBCUTANEOUS | Status: DC
  Administered 2015-02-19: 4 [IU] via SUBCUTANEOUS

## 2015-02-19 MED ORDER — ROSUVASTATIN CALCIUM 20 MG PO TABS
20.0000 mg | ORAL_TABLET | Freq: Every day | ORAL | Status: DC
  Administered 2015-02-19 – 2015-02-20 (×2): 20 mg via ORAL
  Filled 2015-02-19 (×2): qty 1

## 2015-02-19 MED ORDER — INSULIN ASPART 100 UNIT/ML ~~LOC~~ SOLN
0.0000 [IU] | Freq: Every day | SUBCUTANEOUS | Status: DC
  Administered 2015-02-19: 2 [IU] via SUBCUTANEOUS

## 2015-02-19 MED ORDER — WARFARIN SODIUM 4 MG PO TABS
4.0000 mg | ORAL_TABLET | Freq: Once | ORAL | Status: AC
  Administered 2015-02-19: 4 mg via ORAL
  Filled 2015-02-19: qty 1

## 2015-02-19 MED ORDER — BENZTROPINE MESYLATE 2 MG PO TABS
2.0000 mg | ORAL_TABLET | Freq: Two times a day (BID) | ORAL | Status: DC
  Administered 2015-02-19 – 2015-02-20 (×2): 2 mg via ORAL
  Filled 2015-02-19 (×4): qty 1

## 2015-02-19 MED ORDER — WARFARIN SODIUM 5 MG PO TABS: 5.0000 mg | ORAL_TABLET | Freq: Once | ORAL | Status: DC

## 2015-02-19 MED ORDER — FENOFIBRATE 54 MG PO TABS
54.0000 mg | ORAL_TABLET | Freq: Every day | ORAL | Status: DC
  Administered 2015-02-19 – 2015-02-20 (×2): 54 mg via ORAL
  Filled 2015-02-19 (×2): qty 1

## 2015-02-19 MED ORDER — ENOXAPARIN SODIUM 100 MG/ML ~~LOC~~ SOLN
100.0000 mg | Freq: Once | SUBCUTANEOUS | Status: DC
  Filled 2015-02-19: qty 1

## 2015-02-19 MED ORDER — METOPROLOL SUCCINATE ER 100 MG PO TB24
100.0000 mg | ORAL_TABLET | Freq: Two times a day (BID) | ORAL | Status: DC
Start: 2015-02-19 — End: 2015-02-20
  Administered 2015-02-19 – 2015-02-20 (×3): 100 mg via ORAL
  Filled 2015-02-19 (×2): qty 1
  Filled 2015-02-19: qty 4
  Filled 2015-02-19 (×2): qty 1

## 2015-02-19 MED ORDER — INSULIN ASPART 100 UNIT/ML ~~LOC~~ SOLN
0.0000 [IU] | Freq: Three times a day (TID) | SUBCUTANEOUS | Status: DC
  Administered 2015-02-19: 2 [IU] via SUBCUTANEOUS
  Administered 2015-02-19 – 2015-02-20 (×3): 5 [IU] via SUBCUTANEOUS

## 2015-02-19 MED ORDER — COUMADIN BOOK
Freq: Once | Status: AC
  Administered 2015-02-19: 18:00:00
  Filled 2015-02-19: qty 1

## 2015-02-19 MED ORDER — INSULIN GLARGINE 100 UNIT/ML ~~LOC~~ SOLN
30.0000 [IU] | Freq: Every day | SUBCUTANEOUS | Status: DC
  Administered 2015-02-20: 30 [IU] via SUBCUTANEOUS
  Filled 2015-02-19: qty 0.3

## 2015-02-19 MED ORDER — WARFARIN VIDEO
Freq: Once | Status: AC
  Administered 2015-02-19: 1

## 2015-02-19 NOTE — Progress Notes (Signed)
Patient Name: Natasha Martin Date of Encounter: 02/19/2015     Principal Problem:   Acute ischemic colitis Active Problems:   Atrial fibrillation with RVR   GI bleed   Abdominal distention   Noninfectious gastroenteritis and colitis   Diabetes mellitus type 2 in nonobese   Acute encephalopathy   Ileus   Atrial flutter with rapid ventricular response   Hyponatremia secondary to SA IDH   Hypokalemia   Hypophosphatemia   Elevated d-dimer   Schizophrenia   Atrial fibrillation    SUBJECTIVE  The patient denies any chest pain or shortness of breath.  He remains in atrial flutter with predominantly 2 to one ventricular response.  Heart rate increases to the 150s with exertion and she is short of breath with exertion.  CURRENT MEDS . amiodarone  400 mg Oral BID  . carbamazepine  400 mg Oral BID  . digoxin  0.125 mg Oral Daily  . famotidine  20 mg Oral Daily  . feeding supplement (GLUCERNA SHAKE)  237 mL Oral BID BM  . haloperidol  10 mg Oral BID  . heparin subcutaneous  5,000 Units Subcutaneous 3 times per day  . insulin aspart  0-20 Units Subcutaneous TID WC  . insulin aspart  0-5 Units Subcutaneous QHS  . insulin glargine  40 Units Subcutaneous Daily  . metoprolol succinate  75 mg Oral BID AC  . nystatin   Topical BID  . QUEtiapine  600 mg Oral QHS  . traZODone  50 mg Oral QHS    OBJECTIVE  Filed Vitals:   02/18/15 2220 02/19/15 0000 02/19/15 0200 02/19/15 0400  BP: 85/51 97/52 104/51 98/44  Pulse:      Temp:      TempSrc:      Resp: _0 Height:      Weight:      SpO2: 96% 96% 97% 96%    Intake/Output Summary (Last 24 hours) at 02/19/15 0721 Last data filed at 02/18/15 2051  Gross per 24 hour  Intake   1310 ml  Output    901 ml  Net    409 ml   Filed Weights   02/15/15 0527 02/16/15 0400 02/17/15 0700  Weight: 162 lb 14.7 oz (73.9 kg) 162 lb 7.7 oz (73.7 kg) 162 lb 7.7 oz (73.7 kg)    PHYSICAL EXAM    General: Pleasant, NAD. Speech  impediment. Difficult to understand at times. Neuro: Alert and oriented X 3. Moves all extremities spontaneously. Psych: Normal affect. HEENT: Normal Neck: Supple without bruits or JVD. Lungs: Resp regular and unlabored, CTA. Heart: Irregularly irregular no s3, s4, or murmurs. Abdomen: Soft, non-tender, non-distended, soft bowel sounds. Extremities: No clubbing, cyanosis or edema. DP/PT/Radials 2+ and equal bilaterally.  CBC  Recent Labs  02/17/15 0415 02/18/15 0515  WBC 6.9 6.1  HGB 8.9* 9.6*  HCT 26.2* 28.0*  MCV 91.9 91.2  PLT 204 242   Basic Metabolic Panel  Recent Labs  02/17/15 0415 02/18/15 0515  NA 129* 130*  K 4.8 4.2  CL 98 96  CO2 25 26  GLUCOSE 253* 162*  BUN 7 8  CREATININE 0.61 0.63  CALCIUM 7.9* 7.9*   Liver Function Tests No results for input(s): AST, ALT, ALKPHOS, BILITOT, PROT, ALBUMIN in the last 72 hours. No results for input(s): LIPASE, AMYLASE in the last 72 hours. Cardiac Enzymes No results for input(s): CKTOTAL, CKMB, CKMBINDEX, TROPONINI in the last 72 hours. BNP Invalid input(s): POCBNP D-Dimer No  results for input(s): DDIMER in the last 72 hours. Hemoglobin A1C No results for input(s): HGBA1C in the last 72 hours. Fasting Lipid Panel No results for input(s): CHOL, HDL, LDLCALC, TRIG, CHOLHDL, LDLDIRECT in the last 72 hours. Thyroid Function Tests No results for input(s): TSH, T4TOTAL, T3FREE, THYROIDAB in the last 72 hours.  Invalid input(s): FREET3  TELE  Atrial flutter with rapid ventricular response  ECG    Radiology/Studies  Ct Head Wo Contrast  02/11/2015   CLINICAL DATA:  Altered mental status today.  EXAM: CT HEAD WITHOUT CONTRAST  TECHNIQUE: Contiguous axial images were obtained from the base of the skull through the vertex without intravenous contrast.  COMPARISON:  02/09/2015  FINDINGS: The ventricles are normal in size and configuration. No extra-axial fluid collections are identified. The gray-white  differentiation is normal. No CT findings for acute intracranial process such as hemorrhage or infarction. No mass lesions. The brainstem and cerebellum are grossly normal.  The bony structures are intact. The paranasal sinuses and mastoid air cells are clear. The globes are intact.  IMPRESSION: Normal head CT.  No change since 2 days ago.   Electronically Signed   By: Marijo Sanes M.D.   On: 02/11/2015 21:44   Ct Angio Chest Pe W/cm &/or Wo Cm  02/12/2015   CLINICAL DATA:  Tachycardia with elevated D-dimer.  EXAM: CT ANGIOGRAPHY CHEST WITH CONTRAST  TECHNIQUE: Multidetector CT imaging of the chest was performed using the standard protocol during bolus administration of intravenous contrast. Multiplanar CT image reconstructions and MIPs were obtained to evaluate the vascular anatomy.  CONTRAST:  163mL OMNIPAQUE IOHEXOL 350 MG/ML SOLN  COMPARISON:  Chest x-ray 02/11/2015 and CT abdomen 02/11/2015  FINDINGS: The lungs are adequately inflated with minimal linear scarring over the left lower lobe. There is a tiny amount of right pleural fluid and associated atelectasis. Airways are within normal.  There is mild prominence of the heart. There is a sub cm lymph node over the left pericardial phrenic angle. Pulmonary arterial system is normal without evidence of emboli. Right-sided aortic arch is present with aberrant left subclavian artery coursing posterior to the esophagus. Nasogastric tube is present. There is no mediastinal, hilar or axillary adenopathy.  Images through the upper abdomen demonstrate minimal diffuse hepatic steatosis. Tiny amount of perihepatic fluid unchanged. Remainder of the exam is unchanged.  Review of the MIP images confirms the above findings.  IMPRESSION: No evidence pulmonary embolism.  Tiny amount of right pleural fluid minimal right basilar atelectasis. Linear scarring left base.  Mild cardiomegaly. Right-sided aortic arch with aberrant left subclavian artery.   Electronically Signed    By: Marin Olp M.D.   On: 02/12/2015 17:25   Ct Abdomen Pelvis W Contrast  02/11/2015   CLINICAL DATA:  Abdominal pain and distention, decreased bowel sounds.  EXAM: CT ABDOMEN AND PELVIS WITH CONTRAST  TECHNIQUE: Multidetector CT imaging of the abdomen and pelvis was performed using the standard protocol following bolus administration of intravenous contrast.  CONTRAST:  177mL OMNIPAQUE IOHEXOL 300 MG/ML  SOLN  COMPARISON:  Radiographs 1 day prior, CT abdomen 2 days prior.  FINDINGS: Bibasilar atelectasis, minimal pleural thickening dependently.  Enteric tube in place, tip in the duodenum. There is fluid distending the stomach. Diffusely fluid-filled small bowel with small bowel dilatation, measuring up to 4.9 cm. No definite transition point, the distal small bowel loops are decompressed. There is diffuse colonic wall thickening with surrounding perienteric inflammatory change from the mid transverse colon to the rectum.  The distal colon is fluid-filled. The more proximal colon is normal in caliber with small volume of stool. The appendix is normal.  No free air or pneumatosis. Small amount of free fluid in the perisplenic space and pelvis is likely reactive. The mesenteric vessels appear patent.  No focal hepatic lesion. There is an elongated left hepatic lobe. The gallbladder is physiologically distended. The spleen and pancreas are unremarkable. Unchanged adrenal nodularity. Kidneys demonstrate symmetric enhancement and excretion. No hydronephrosis or perinephric stranding.  Abdominal aorta is normal in caliber, minimal atherosclerosis. No retroperitoneal adenopathy. Mild whole body wall edema.  Within the pelvis the urinary bladder is decompressed by Foley catheter. Uterus remains in situ, atrophic, normal for age. No adnexal mass.  There are no acute or suspicious osseous abnormalities. Remote lower left rib fractures.  IMPRESSION: 1. Colitis extending from the mid transverse colon through the rectum.  Findings may be infectious or inflammatory. There is no perforation or abscess. 2. Dilated and fluid-filled small bowel loops in the central and lower abdomen. This may be reactive ileus versus small bowel enteritis. 3. Small amount of free fluid in the pelvis and perisplenic space.   Electronically Signed   By: Jeb Levering M.D.   On: 02/11/2015 21:53   Dg Chest Port 1 View  02/11/2015   CLINICAL DATA:  Central catheter placement  EXAM: PORTABLE CHEST - 1 VIEW  COMPARISON:  February 10, 2015  FINDINGS: Central catheter tip is at the cavoatrial junction. Nasogastric tube tip and side port are in the distal stomach. No pneumothorax. There is no edema or consolidation. Heart is upper normal in size with pulmonary vascularity within normal limits. No adenopathy.  IMPRESSION: Tube and catheter positions as described without pneumothorax. No edema or consolidation.   Electronically Signed   By: Lowella Grip III M.D.   On: 02/11/2015 17:33   Dg Abd Acute W/chest  02/13/2015   CLINICAL DATA:  Altered mental status. Abdominal distention. Colitis.  EXAM: DG ABDOMEN ACUTE W/ 1V CHEST  COMPARISON:  CT examinations 414 and 02/11/2015  FINDINGS: Upright chest x-ray: The right IJ central venous catheter tip is in distal SVC near the cavoatrial junction. This is stable. The heart is normal in size. Low lung volumes with vascular crowding and bibasilar atelectasis. The NG tube is stable.  Two views of the abdomen demonstrate diffusely distended small bowel loops and right colon. Suspect functional obstruction due to severe colitis beginning in the mid transverse colon  IMPRESSION: Persistent dilated small bowel and right colon likely due to a functional obstruction related to severe colitis beginning in the mid transverse colon. No definite free air.  Stable bibasilar atelectasis.   Electronically Signed   By: Marijo Sanes M.D.   On: 02/13/2015 15:33    ASSESSMENT AND PLAN  1. Newly documented atrial flutter with  RVR, duration uncertain.Marland Kitchen  CHADSVASC score is 3. She is now taking PO's.  Ejection fraction is normal at 60-65% by echo  2. Colitis, infectious versus ischemic. She is being managed by gastroenterology.  Rectal bleeding appears to have stopped.we will begin anticoagulation and watch closely   3. Ileus, improved.  4. History of GI bleeding.  5. History of schizophrenia and developmental mental delays.  6. Hyponatremia  Plan: Continue rate control with digoxin and metoprolol.  Will increase metoprololto 100 mg twice a day.ntinue amiodarone loading.start Xarelto  Okay from cardiac standpoint to increase activity.  Signed, Darlin Coco MD

## 2015-02-19 NOTE — Care Management Note (Signed)
CARE MANAGEMENT NOTE 02/19/2015  Patient:  Natasha Martin,Natasha Martin   Account Number:  [id number]  Date Initiated:  02/12/2015  Documentation initiated by:  DAVIS,RHONDA  Subjective/Objective Assessment:   infectious colitis with sirs     Action/Plan:   home when stable/ hx of M.R. [facility] [place] [facility]   Anticipated DC Date:  02/20/2015   Anticipated DC Plan:  ASSISTED LIVING / REST HOME  In-house referral  Clinical Social Worker      DC Planning Services  CM consult      [facility]  [facility]   Choice offered to / List presented to:             Status of service:  In process, will continue to follow Medicare Important Message given?   (If response is "NO", the following Medicare IM given date fields will be blank) Date Medicare IM given:   Medicare IM given by:   Date Additional Medicare IM given:   Additional Medicare IM given by:    Discharge Disposition:  SKILLED NURSING FACILITY  Per UR Regulation:  Reviewed for med. necessity/level of care/duration of stay  If discussed at Long Length of Stay Meetings, dates discussed:    Comments:  February 19, 2015/Rhonda L. Earlene Plateravis, RN, BSN, CCM. Case Management [facility] [facility] [phone] No discharge needs present of time of review. Transferred from sdu to med surg tele bed [id number]/poss dc in am/  [id number]/Rhonda Stark JockDavis, RN, BSN, [place] [phone] Chart Reviewed. Discharge needs at time of review: None present will follow for needs. Chart note for progression: [id number]: Blood pressure is soft.  Heart rate is still rapid.  We will continue IV diltiazem for rate control today before switching to oral tomorrow.  We will continue IV loading of amiodarone to help with rate control.  Continue current oral metoprolol 25 mg 4 times a day. Follow hemoglobin carefully. Signed, Cassell Clementhomas Brackbill MD  February 12, 2015/Rhonda L. Earlene Plateravis, RN, BSN, CCM. Case Management [facility] [facility] [phone] No discharge needs present of  time of review.

## 2015-02-19 NOTE — Progress Notes (Signed)
Called and spoke with the pts sister [place], who is also an Charity fundraiserN and let her know what the plan for her sister was and that she would be transferred to the tele floor today. Also tried to call and let the pts mother know what was going on but with every attempt I got busy signal on the phone

## 2015-02-19 NOTE — Progress Notes (Addendum)
Progress Note   Natasha Martin UJW:[id number] DOB: Mar 18, 1961 DOA: 02/11/2015 PCP: PROVIDER NOT IN SYSTEM   Brief Narrative:   Natasha Martin is an 54 y.o. female with PMH of developmental delay, seizures, schizophrenia, hypertension and diabetes who was admitted to an [facility] 02/08/15 with altered mental status.  CT abdomen pelvis without contrast was done for distended abdomen (without contrast given elevated creatinine of 1.9), and showed wall thickening from the mid transverse to descending colon. Her hospital course was complicated by atrial fibrillation with RVR and she was transferred to [facility] on 02/11/15. She is currently being treated for ischemic colitis and cardiology is following for her A. fib with RVR.  Assessment/Plan:   Principal Problem:   Acute ischemic colitis / GI bleed / abdominal distention / noninfectious gastroenteritis and colitis - Thought to be ischemic. Managed IV Zosyn, which was discontinued 02/16/15 after 6 days of therapy.  - GI evaluated and signed off 02/15/15 with recommendations for outpatient GI follow-up with Dr. Jonette Eva.  - Resolving, diet advanced to soft foods, which she is tolerating. - Hemoglobin remains stable and is trending up.  Active Problems:   Hyponatremia secondary to SIADH - Sodium stable at 130. Continue fluid restriction.    Atrial fibrillation/flutter with RVR - New onset. TSH WNL. - Managed by cardiology: initially on Cardizem and amiodarone drip.   Transitioned to oral therapy 02/17/15: Amiodarone, digoxin and metoprolol. - 2-D echo showed EF 60-65%. HR remains elevated in the 120s. - Continue IV metoprolol as needed for heart rate greater than 120. - Continue to monitor on telemetry given her risk of QTC prolongation with amiodarone and antipsychotic medications. - To start anti-coagulation with Lovenox/coumadin given CHADS2vasc score of 3.  Monitor H&H closely.    Acute encephalopathy - Most likely metabolic in the  setting of hyponatremia, intra-abdominal process, with possible polypharmacy. - CT head without contrast 02/11/15 negative for acute findings. - EEG negative for seizures, consistent with encephalopathy. - Resolved,now at baseline per family.    Ileus - Initially managed with NG tube/gastric decompression. Resolved. Tolerating diet.    Hypokalemia/hypophosphatemia - Electrolytes being monitored and replaced as needed.    Elevated d-dimer - VTE ruled out with CT chest and lower extremity Dopplers.    Schizophrenia  - Usual home medications including Seroquel, carbamazepine, Haldol and trazodone resumed.  Invega on hold. - Family advised of the potential interaction of amiodarone with her psychiatry medications including Haldol and risk of cardiac arrhythmias with this combination.    Poorly controlled type 2 diabetes  - Currently being managed with Lantus 30 units daily and moderate scale SSI 3 times a day. CBGs 191-335. - Add meal coverage. - Glyburide and Amaryl on hold.    DVT Prophylaxis - Continue SCDs and heparin.  Code Status: Full. Family Communication: Mother Natasha Martin), and niece Natasha Martin) updated at the bedside 02/18/15, no family at bedside today. Disposition Plan: From a group home, likely to return there in 1-2 days if HR stable. Heart rate remained suboptimally controlled.   IV Access:    Peripheral IV   Procedures and diagnostic studies:   Ct Head Wo Contrast 02/11/2015: Normal head CT.  No change since 2 days ago.     Ct Abdomen Pelvis W Contrast 02/11/2015: 1. Colitis extending from the mid transverse colon through the rectum. Findings may be infectious or inflammatory. There is no perforation or abscess. 2. Dilated and fluid-filled small bowel loops in the central and  lower abdomen. This may be reactive ileus versus small bowel enteritis. 3. Small amount of free fluid in the pelvis and perisplenic space.    Dg Chest Port 1 View 02/11/2015: Tube and catheter  positions as described without pneumothorax. No edema or consolidation.    Ct Angio Chest Pe W/cm &/or Wo Cm 02/12/2015: No evidence pulmonary embolism.  Tiny amount of right pleural fluid minimal right basilar atelectasis. Linear scarring left base.  Mild cardiomegaly. Right-sided aortic arch with aberrant left subclavian artery.    Dg Abd Acute W/chest 02/13/2015: Persistent dilated small bowel and right colon likely due to a functional obstruction related to severe colitis beginning in the mid transverse colon. No definite free air.  Stable bibasilar atelectasis.      Medical Consultants:    Dr. Claudette HeadMalcolm Stark, GI  Dr. Verne Carrowhristopher McAlhany, Cardiology  Dr. Ileene Rubensodd Jerkin, Surgery  Dr. Max Fickleouglas McQuaid, PCCM  Anti-Infectives:    Flagyl 02/11/15---> 02/14/15  Zosyn 02/11/15---> 02/16/15  Subjective:   Natasha Martin asks, "Can you get me some crayons?  Has had some nausea and loose stools. No chest pain.  No dyspnea.  Objective:    Filed Vitals:   02/18/15 2220 02/19/15 0000 02/19/15 0200 02/19/15 0400  BP: 85/51 97/52 104/51 98/44  Pulse:      Temp:      TempSrc:      Resp: 16 16 22 16   Height:      Weight:      SpO2: 96% 96% 97% 96%    Intake/Output Summary (Last 24 hours) at 02/19/15 0710 Last data filed at 02/18/15 2051  Gross per 24 hour  Intake   1310 ml  Output    901 ml  Net    409 ml    Exam: Gen:  NAD, sitting up, talkative Cardiovascular:  Tachy/reg with a few irregular beats Respiratory:  Lungs CTAB Gastrointestinal:  Abdomen soft, NT, + BS Extremities:  No C/E/C, SCDs on   Data Reviewed:    Labs: Basic Metabolic Panel:  Recent Labs Lab 02/13/15 0528 02/14/15 0545 02/15/15 0420 02/16/15 0600 02/17/15 0415 02/18/15 0515  NA 135 137 132* 125* 129* 130*  K 2.9* 3.2* 3.8 4.7 4.8 4.2  CL 99 99 100 98 98 96  CO2 22 25 26 23 25 26   GLUCOSE 200* 190* 273* 208* 253* 162*  BUN 10 8 8 7 7 8   CREATININE 0.70 0.61 0.55 0.59 0.61 0.63  CALCIUM 7.3*  7.4* 7.5* 7.4* 7.9* 7.9*  MG 1.7 1.9 1.6 1.6  --   --   PHOS  --  1.8* 2.0* 2.6  --   --    GFR Estimated Creatinine Clearance: 75.6 mL/min (by C-G formula based on Cr of 0.63). Liver Function Tests:  Recent Labs Lab 02/15/15 0420 02/16/15 0600  AST 14 15  ALT 10 12  ALKPHOS 50 56  BILITOT 0.5 0.5  PROT 5.0* 5.0*  ALBUMIN 2.4* 2.5*   CBC:  Recent Labs Lab 02/13/15 0528  02/15/15 0420 02/15/15 1105 02/16/15 0600 02/17/15 0415 02/18/15 0515  WBC 3.9*  --  5.9  --  12.3* 6.9 6.1  NEUTROABS  --   --  3.7  --  10.2*  --   --   HGB 10.0*  < > 9.8* 10.3* 9.1* 8.9* 9.6*  HCT 28.3*  < > 28.0* 29.9* 26.6* 26.2* 28.0*  MCV 91.0  --  91.8  --  91.7 91.9 91.2  PLT 173  --  197  --  198 204 214  < > = values in this interval not displayed.   CBG:  Recent Labs Lab 02/17/15 1532 02/18/15 0744 02/18/15 1216 02/18/15 1730 02/18/15 2126  GLUCAP 242* 191* 335* 292* 259*   Sepsis Labs:  Recent Labs Lab 02/13/15 0528 02/15/15 0420 02/16/15 0600 02/17/15 0415 02/18/15 0515  PROCALCITON 1.60  --   --   --   --   WBC 3.9* 5.9 12.3* 6.9 6.1   Microbiology Recent Results (from the past 240 hour(s))  MRSA PCR Screening     Status: None   Collection Time: 02/11/15  8:12 PM  Result Value Ref Range Status   MRSA by PCR NEGATIVE NEGATIVE Final    Comment:        The GeneXpert MRSA Assay (FDA approved for NASAL specimens only), is one component of a comprehensive MRSA colonization surveillance program. It is not intended to diagnose MRSA infection nor to guide or monitor treatment for MRSA infections.   Clostridium Difficile by PCR     Status: None   Collection Time: 02/17/15  9:47 PM  Result Value Ref Range Status   C difficile by pcr NEGATIVE NEGATIVE Final     Medications:   . amiodarone  400 mg Oral BID  . carbamazepine  400 mg Oral BID  . digoxin  0.125 mg Oral Daily  . famotidine  20 mg Oral Daily  . feeding supplement (GLUCERNA SHAKE)  237 mL Oral BID  BM  . haloperidol  10 mg Oral BID  . heparin subcutaneous  5,000 Units Subcutaneous 3 times per day  . insulin aspart  0-15 Units Subcutaneous TID WC  . insulin glargine  30 Units Subcutaneous Daily  . metoprolol succinate  75 mg Oral BID AC  . nystatin   Topical BID  . QUEtiapine  600 mg Oral QHS  . traZODone  50 mg Oral QHS   Continuous Infusions:   Time spent: 25 minutes.   LOS: 8 days   RAMA,CHRISTINA  [facility] Pager [phone]. If unable to reach me by pager, please call my cell phone at [phone].  *Please refer to [email or web address], password [email or web address] to get updated schedule on who will round on this patient, as hospitalists switch teams weekly. If 7PM-7AM, please contact night-coverage at [email or web address], password [email or web address] for any overnight needs.  02/19/2015, 7:10 AM

## 2015-02-19 NOTE — Progress Notes (Signed)
CSW is assisting with d/c planning. PN reviewed. Spoke with Dr. Darnelle Catalanama this am. SNF placement has been recommended at this time. Pt's sister contacted Reuel Boom( [place] Cochran [phone] ) and agrees with plan. Family has requested [facility]. Clinicals have been. Updated med list will be sent shortly. Pasrr submitted and a # is pending. CSW will continue to follow to assist with d/c planning to SNF.  Cori RazorJamie Axl Rodino LCSW [phone]

## 2015-02-19 NOTE — Progress Notes (Signed)
CSW assisting with d/c planning. [facility] has a SN[facility] bed available [facility] if pt is ready for d/c. Pasrr # number received . Pharmacy note sent to SNF for review. CSW will continue to follow to assist with d/c planning to SNF.  Cori RazorJamie Sharlot Sturkey LCSW [phone]

## 2015-02-19 NOTE — Progress Notes (Signed)
Inpatient Diabetes Program Recommendations  AACE/ADA: New Consensus Statement on Inpatient Glycemic Control (2013)  Target Ranges:  Prepandial:   less than 140 mg/dL      Peak postprandial:   less than 180 mg/dL (1-2 hours)      Critically ill patients:  140 - 180 mg/dL   Reason for Visit: Glycemic control  Results for Natasha Martin, Natasha Martin (MRN [id number]) as of 02/19/2015 10:00  Ref. Range 02/18/2015 07:44 02/18/2015 12:16 02/18/2015 17:30 02/18/2015 21:26 02/19/2015 07:27  Glucose-Capillary Latest Ref Range: 70-99 mg/dL 657191 (H) 846335 (H) 962292 (H) 259 (H) 160 (H)   FBS looks good with 30 units of Lantus. Post-prandial blood sugars elevated.  Recommendations: Decrease Lantus from 40 to 30 units QHS. Add Novolog 4 units tidwc for meal coverage insulin if pt eats > 50% meal. (On 8 units QID at home)  Will continue to follow. Thank you. Ailene Ardshonda Adelina Collard, RD, LDN, CDE Inpatient Diabetes Coordinator [phone]

## 2015-02-19 NOTE — Progress Notes (Addendum)
ANTICOAGULATION CONSULT NOTE - Initial Consult  Pharmacy Consult for warfarin/enoxaparin Indication: atrial fibrillation  No Known Allergies  Patient Measurements: Height:  (154.9 cm) Weight: 162 lb 7.7 oz (73.7 kg) IBW/kg (Calculated) : 47.8  Vital Signs: Temp: 98.3 F (36.8 C) (04/21 1200) Temp Source: Oral (04/21 1200) BP: 113/73 mmHg (04/21 0741) Pulse Rate: 118 (04/21 0741)  Labs:  Recent Labs  02/17/15 0415 02/18/15 0515 02/19/15 0647  HGB 8.9* 9.6*  --   HCT 26.2* 28.0*  --   PLT 204 214  --   CREATININE 0.61 0.63 0.61    Estimated Creatinine Clearance: 75.6 mL/min (by C-G formula based on Cr of 0.61).   Medical History: Past Medical History  Diagnosis Date  . Diabetes   . Hyperlipidemia   . Schizophrenia   . Anxiety   . Hypertension   . Seizures     has not had a seizure since last year. Unknown orgin- no family available to come and patient does not know.  Marland Kitchen Dysphagia   . Mild mental slowing   . Poor historian     Medications:  Prescriptions prior to admission  Medication Sig Dispense Refill Last Dose  . acetaminophen (TYLENOL) 325 MG tablet Take 650 mg by mouth every 6 (six) hours as needed for moderate pain.   unknown at unknown time  . insulin aspart (NOVOLOG) 100 UNIT/ML injection Inject 8 Units into the skin 4 (four) times daily. Give 8 units QID as long as Blood Sugar is above 150.   02/08/2015 at unknown time  . insulin glargine (LANTUS) 100 UNIT/ML injection Inject 20 Units into the skin at bedtime.   02/07/2015 at unknown time  . paliperidone (INVEGA) 6 MG 24 hr tablet Take 12 mg by mouth at bedtime.   02/07/2015 at unknown time  . sodium chloride 1 G tablet Take 1 g by mouth daily.     Marland Kitchen aspirin 81 MG tablet Take 81 mg by mouth daily.   02/01/2015 at unknown time  . atenolol (TENORMIN) 25 MG tablet Take 25 mg by mouth daily.    02/08/2015 at 0800  . benztropine (COGENTIN) 2 MG tablet Take 2 mg by mouth 2 (two) times daily.    02/08/2015 at  0800  . calcium-vitamin D (OSCAL WITH D) 500-200 MG-UNIT per tablet Take 1 tablet by mouth daily.   02/08/2015 at 0800  . carbamazepine (TEGRETOL) 200 MG tablet Take 400 mg by mouth 2 (two) times daily.    02/08/2015 at 0800  . clotrimazole-betamethasone (LOTRISONE) cream Apply 1 application topically daily as needed (dry skin and rashes).    unknown at unkjnown time  . CRESTOR 20 MG tablet Take 20 mg by mouth daily.    02/08/2015 at 0800  . diazepam (VALIUM) 10 MG tablet Take 10 mg by mouth daily.    02/07/2015 at 0800  . fenofibrate (TRICOR) 145 MG tablet Take 145 mg by mouth daily.    02/08/2015 at 0800  . glimepiride (AMARYL) 4 MG tablet Take 4 mg by mouth 2 (two) times daily.    02/08/2015 at 0800  . glyBURIDE (DIABETA) 5 MG tablet Take 2.5 mg by mouth 2 (two) times daily with a meal.    02/08/2015 at 0800  . haloperidol (HALDOL) 10 MG tablet Take 40 mg by mouth 2 (two) times daily.    02/08/2015 at 0800  . LORazepam (ATIVAN) 1 MG tablet Take 1 mg by mouth 2 (two) times daily as needed for anxiety (anxiety).  02/07/2015 at 0800  . Multiple Vitamins-Minerals (MULTIVITAMINS THER. W/MINERALS) TABS tablet Take 1 tablet by mouth daily.   02/08/2015 at unknown time  . nortriptyline (PAMELOR) 50 MG capsule Take 100 mg by mouth at bedtime.    02/07/2015 at unknown time  . Omega-3 Fatty Acids (OMEGA-3 FISH OIL PO) Take 1,000 mg by mouth 3 (three) times daily.   02/08/2015 at unknown time  . omeprazole (PRILOSEC) 20 MG capsule 1 po bid 30 minutes before meals for 3 mos then once daily FOREVER (Patient taking differently: Take 20 mg by mouth daily. ) 60 capsule 11 02/08/2015 at 0800  . QUEtiapine (SEROQUEL) 200 MG tablet Take 800 mg by mouth at bedtime.    02/07/2015 at unknown time  . ramipril (ALTACE) 10 MG capsule Take 10 mg by mouth daily.    02/08/2015 at 0800  . traZODone (DESYREL) 100 MG tablet Take 100 mg by mouth at bedtime as needed for sleep.    unknown at unknown time   Scheduled:  . amiodarone  400 mg Oral  BID  . carbamazepine  400 mg Oral BID  . digoxin  0.125 mg Oral Daily  . famotidine  20 mg Oral Daily  . feeding supplement (GLUCERNA SHAKE)  237 mL Oral BID BM  . haloperidol  10 mg Oral BID  . heparin subcutaneous  5,000 Units Subcutaneous 3 times per day  . insulin aspart  0-15 Units Subcutaneous TID WC  . insulin aspart  0-5 Units Subcutaneous QHS  . insulin aspart  4 Units Subcutaneous TID WC  . [START ON 02/20/2015] insulin glargine  30 Units Subcutaneous Daily  . metoprolol succinate  100 mg Oral BID AC  . nystatin   Topical BID  . QUEtiapine  600 mg Oral QHS  . traZODone  50 mg Oral QHS   Infusions:   PRN: albuterol, ALPRAZolam, metoprolol, ondansetron **OR** ondansetron (ZOFRAN) IV  Assessment: 20 yoF with mental development delay at birth, seizures, possible schizophrenia, HTN, and DM admitted to OSH 4/10 for AMS. Hospital stay complicated by new onset A. Fib with RVR and abdominal distention with colitis and pt tx to [facility] 4/13 for further management and treatment of her A. fib, abdominal distention and colitis, and AMS. Abd films 4/15 showed persistent dilated small bowel and right colon likely ileus due to ischemic colitis.    Significant events: 4/17: Noted some blood with first BMs, but appears to have resolved.  GI signed off with outpatient follow-up 4/21: Cardiology would like to begin anticoagulation for new A-flutter.  Plan is to send patient to SNF.  Note all direct oral anticoagulants are contraindicated (Pradaxa, Xarelto, Eliquis due to carbamazepine interaction, and Savaysa due to CrCl > 95 ml/min by total body weight) - only remaining option is warfarin.  Today, 02/19/2015:  Baseline INR wnl  CBC with Hgb low but stable; Plt wnl  Significant drug interactions: amiodarone (incr INR), carbamazepine (decr INR)  Possibly some degree of hepatic insufficiency indicated by chronically low albumin (transaminases wnl)  Eating 100% of diet  No further bleeding  complications documented   Goal of Therapy:  INR 2-3 Anti-Xa level 0.6-1 units/ml 4hrs after LMWH dose given Monitor platelets by anticoagulation protocol: Yes   Plan:  Due to multiple issues with warfarin dosing (DDIs, recent bleed), will need close follow-up after discharge  Discontinue SQ heparin  Lovenox 100 mg x 1 at 14:00, then 75 mg (1 mg/kg) SQ q12 hr starting tomorrow at 10:00.  Warfarin 4 mg  PO tonight at 18:00; with amiodarone just starting, dose will likely need to be reduced over the next month  Recommend starting conservatively as patient has active bridging, recent bleed, and ? Hepatic insufficiency.  Would discharge on warfarin 3 - 4 mg PO daily until INR trend becomes clear.  CBC, SCr at least q72 hrs, daily INR while admitted  Monitor closely for new signs of bleeding, especially GI  Provide warfarin education as able given cognitive impairment.  Will need to continue Lovenox for at least 5 days AND until 2 consecutive INR values are therapeutic  Recommend switching to Lovenox 110 mg (1.5 mg/kg) SQ q24 hr at discharge for simplified dosing.   Bernadene Personrew Mart Colpitts, PharmD Pager: [phone] 02/19/2015, 12:28 PM

## 2015-02-19 NOTE — Progress Notes (Signed)
Physical Therapy Treatment Patient Details Name: Natasha Martin MRN: [id number] DOB: Nov 11, 1960 Today's Date: 02/19/2015    History of Present Illness 54 yo female with  MR, resident of group home [facility]  [facility] with abdominal pain/colitis, noted to have melena stools and onset of afib with RVRand  [facility]. also found to have SB ileus.    PT Comments    Patient's HR fluctuating 115-124 during ambulation. Impulsive and requires frequent cues for safety.   Follow Up Recommendations  SNF     Equipment Recommendations  None recommended by PT    Recommendations for Other Services       Precautions / Restrictions Precautions Precautions: Fall Precaution Comments: monitor HR    Mobility  Bed Mobility   Bed Mobility: Supine to Sit;Sit to Supine     Supine to sit: Supervision Sit to supine: Supervision      Transfers Overall transfer level: Needs assistance Equipment used: Rolling walker (2 wheeled) Transfers: Sit to/from Stand Sit to Stand: Min assist            Ambulation/Gait Ambulation/Gait assistance: Min assist;+2 safety/equipment Ambulation Distance (Feet): 210 Feet Assistive device: Rolling walker (2 wheeled) Gait Pattern/deviations: Step-through pattern Gait velocity: fast   General Gait Details: multimodal cues for safety with use of RW, patient walks fast, frequent tactile cues to slow RW. noted increased dyspnea with ambulation. HR 124 max noted.   Stairs            Wheelchair Mobility    Modified Rankin (Stroke Patients Only)       Balance             Standing balance-Leahy Scale: Fair                      Cognition Arousal/Alertness: Awake/alert Behavior During Therapy: Impulsive;Restless Overall Cognitive Status: History of cognitive impairments - at baseline                      Exercises      General Comments        Pertinent Vitals/Pain Pain  Assessment: Faces Faces Pain Scale: Hurts little more Pain Location: periarea Pain Descriptors / Indicators: Burning Pain Intervention(s): Repositioned    Home Living                      Prior Function            PT Goals (current goals can now be found in the care plan section) Progress towards PT goals: Progressing toward goals    Frequency  Min 3X/week    PT Plan Current plan remains appropriate    Co-evaluation             End of Session Equipment Utilized During Treatment: Gait belt Activity Tolerance: Patient tolerated treatment well Patient left: in bed;with call bell/phone within reach;with bed alarm set     Time: 1555-1605 PT Time Calculation (min) (ACUTE ONLY): 10 min  Charges:  $Gait Training: 8-22 mins                    G Codes:      [name], Tabby Beaston Elizabeth 02/19/2015, 4:46 PM

## 2015-02-20 DIAGNOSIS — I4892 Unspecified atrial flutter: Secondary | ICD-10-CM | POA: Diagnosis not present

## 2015-02-20 DIAGNOSIS — E119 Type 2 diabetes mellitus without complications: Secondary | ICD-10-CM | POA: Diagnosis not present

## 2015-02-20 DIAGNOSIS — K550 Acute vascular disorders of intestine: Secondary | ICD-10-CM | POA: Diagnosis not present

## 2015-02-20 DIAGNOSIS — I10 Essential (primary) hypertension: Secondary | ICD-10-CM | POA: Diagnosis not present

## 2015-02-20 DIAGNOSIS — R198 Other specified symptoms and signs involving the digestive system and abdomen: Secondary | ICD-10-CM | POA: Diagnosis not present

## 2015-02-20 DIAGNOSIS — I4891 Unspecified atrial fibrillation: Secondary | ICD-10-CM | POA: Diagnosis not present

## 2015-02-20 DIAGNOSIS — G9340 Encephalopathy, unspecified: Secondary | ICD-10-CM | POA: Diagnosis not present

## 2015-02-20 DIAGNOSIS — R791 Abnormal coagulation profile: Secondary | ICD-10-CM | POA: Diagnosis not present

## 2015-02-20 DIAGNOSIS — I482 Chronic atrial fibrillation: Secondary | ICD-10-CM | POA: Diagnosis not present

## 2015-02-20 DIAGNOSIS — E876 Hypokalemia: Secondary | ICD-10-CM | POA: Diagnosis not present

## 2015-02-20 DIAGNOSIS — K567 Ileus, unspecified: Secondary | ICD-10-CM | POA: Diagnosis not present

## 2015-02-20 DIAGNOSIS — K922 Gastrointestinal hemorrhage, unspecified: Secondary | ICD-10-CM | POA: Diagnosis not present

## 2015-02-20 DIAGNOSIS — K529 Noninfective gastroenteritis and colitis, unspecified: Secondary | ICD-10-CM | POA: Diagnosis not present

## 2015-02-20 DIAGNOSIS — Z7901 Long term (current) use of anticoagulants: Secondary | ICD-10-CM | POA: Diagnosis not present

## 2015-02-20 DIAGNOSIS — F209 Schizophrenia, unspecified: Secondary | ICD-10-CM | POA: Diagnosis not present

## 2015-02-20 DIAGNOSIS — E1165 Type 2 diabetes mellitus with hyperglycemia: Secondary | ICD-10-CM | POA: Diagnosis not present

## 2015-02-20 LAB — CBC
HCT: 29.5 % — ABNORMAL LOW (ref 36.0–46.0)
HEMOGLOBIN: 10 g/dL — AB (ref 12.0–15.0)
MCH: 31.3 pg (ref 26.0–34.0)
MCHC: 33.9 g/dL (ref 30.0–36.0)
MCV: 92.2 fL (ref 78.0–100.0)
Platelets: 246 10*3/uL (ref 150–400)
RBC: 3.2 MIL/uL — ABNORMAL LOW (ref 3.87–5.11)
RDW: 13.5 % (ref 11.5–15.5)
WBC: 5.2 10*3/uL (ref 4.0–10.5)

## 2015-02-20 LAB — BASIC METABOLIC PANEL
ANION GAP: 12 (ref 5–15)
BUN: 6 mg/dL (ref 6–23)
CHLORIDE: 96 mmol/L (ref 96–112)
CO2: 25 mmol/L (ref 19–32)
Calcium: 8.3 mg/dL — ABNORMAL LOW (ref 8.4–10.5)
Creatinine, Ser: 0.71 mg/dL (ref 0.50–1.10)
GFR calc Af Amer: >90 mL/min (ref >90)
GFR calc non Af Amer: >90 mL/min (ref >90)
GLUCOSE: 181 mg/dL — AB (ref 70–99)
POTASSIUM: 4.3 mmol/L (ref 3.5–5.1)
SODIUM: 133 mmol/L — AB (ref 135–145)

## 2015-02-20 LAB — GLUCOSE, CAPILLARY
GLUCOSE-CAPILLARY: 206 mg/dL — AB (ref 70–99)
Glucose-Capillary: 233 mg/dL — ABNORMAL HIGH (ref 70–99)

## 2015-02-20 LAB — PROTIME-INR
INR: 1.1 (ref 0.00–1.49)
Prothrombin Time: 14.4 seconds (ref 11.6–15.2)

## 2015-02-20 LAB — DIGOXIN LEVEL: DIGOXIN LVL: 0.6 ng/mL — AB (ref 0.8–2.0)

## 2015-02-20 MED ORDER — ENOXAPARIN SODIUM 150 MG/ML ~~LOC~~ SOLN: 1.0000 mg/kg | Freq: Two times a day (BID) | SUBCUTANEOUS | Status: DC

## 2015-02-20 MED ORDER — METOPROLOL SUCCINATE ER 100 MG PO TB24: 100.0000 mg | ORAL_TABLET | Freq: Two times a day (BID) | ORAL | Status: DC

## 2015-02-20 MED ORDER — WARFARIN SODIUM 5 MG PO TABS: 5.0000 mg | ORAL_TABLET | Freq: Every day | ORAL | Status: DC

## 2015-02-20 MED ORDER — TRAZODONE HCL 50 MG PO TABS: 50.0000 mg | ORAL_TABLET | Freq: Every evening | ORAL | Status: DC | PRN

## 2015-02-20 MED ORDER — QUETIAPINE FUMARATE 200 MG PO TABS: 600.0000 mg | ORAL_TABLET | Freq: Every day | ORAL | Status: DC

## 2015-02-20 MED ORDER — DIGOXIN 125 MCG PO TABS: 0.1250 mg | ORAL_TABLET | Freq: Every day | ORAL | Status: DC

## 2015-02-20 MED ORDER — HALOPERIDOL 10 MG PO TABS: 10.0000 mg | ORAL_TABLET | Freq: Two times a day (BID) | ORAL | Status: DC

## 2015-02-20 MED ORDER — ONDANSETRON HCL 4 MG PO TABS: 4.0000 mg | ORAL_TABLET | Freq: Four times a day (QID) | ORAL | Status: DC | PRN

## 2015-02-20 MED ORDER — AMIODARONE HCL 200 MG PO TABS
200.0000 mg | ORAL_TABLET | Freq: Two times a day (BID) | ORAL | Status: DC
  Administered 2015-02-20: 200 mg via ORAL
  Filled 2015-02-20 (×2): qty 1

## 2015-02-20 MED ORDER — GLUCERNA SHAKE PO LIQD: 237.0000 mL | Freq: Two times a day (BID) | ORAL | Status: DC

## 2015-02-20 MED ORDER — DIAZEPAM 10 MG PO TABS: 10.0000 mg | ORAL_TABLET | Freq: Every day | ORAL | Status: DC

## 2015-02-20 MED ORDER — LORAZEPAM 1 MG PO TABS: 1.0000 mg | ORAL_TABLET | Freq: Two times a day (BID) | ORAL | Status: DC | PRN

## 2015-02-20 MED ORDER — AMIODARONE HCL 400 MG PO TABS: 400.0000 mg | ORAL_TABLET | Freq: Two times a day (BID) | ORAL | Status: DC

## 2015-02-20 MED ORDER — AMIODARONE HCL 200 MG PO TABS
200.0000 mg | ORAL_TABLET | Freq: Two times a day (BID) | ORAL | Status: DC
Start: 2015-02-20 — End: 2018-02-13

## 2015-02-20 NOTE — Clinical Social Work Placement (Signed)
   CLINICAL SOCIAL WORK PLACEMENT  NOTE  Date:  02/20/2015  Patient Details  Name: Natasha Martin MRN: [id number] Date of Birth: 11-16-1960  Clinical Social Work is seeking post-discharge placement for this patient at the Skilled  Nursing Facility level of care (*CSW will initial, date and re-position this form in  chart as items are completed):  Yes   Patient/family provided with [facility] Clinical Social Work Department's list of facilities offering this level of care within the geographic area requested by the patient (or if unable, by the patient's family).  Yes   Patient/family informed of their freedom to choose among providers that offer the needed level of care, that participate in Medicare, Medicaid or managed care program needed by the patient, have an available bed and are willing to accept the patient.  Yes   Patient/family informed of [facility]'s ownership interest in [facility] and [facility], as well as of the fact that they are under no obligation to receive care at these facilities.  PASRR submitted to EDS on 02/19/15     PASRR number received on 02/19/15     Existing PASRR number confirmed on       FL2 transmitted to all facilities in geographic area requested by pt/family on 02/18/15     FL2 transmitted to all facilities within larger geographic area on       Patient informed that his/her managed care company has contracts with or will negotiate with certain facilities, including the following:        Yes   Patient/family informed of bed offers received.  [facility]     Physician recommends and patient chooses bed at      Patient to be [facility] on 02/20/15.  Patient to be transferred to facility by [facility]     Patient family notified on 02/20/15 of transfer.  Name of family member notified:  [place]     PHYSICIAN Please prepare prescriptions     Additional Comment:     _______________________________________________ Elaina Hoopsarter, Ryland Smoots M, LCSW 02/20/2015, 10:57 AM

## 2015-02-20 NOTE — Discharge Instructions (Signed)
Atrial Fibrillation  Atrial fibrillation is a condition that causes your heart to beat irregularly. It may also cause your heart to beat faster than normal. Atrial fibrillation can prevent your heart from pumping blood normally. It increases your risk of stroke and heart problems.  HOME CARE  · Take medications as told by your doctor.  · Only take medications that your doctor says are safe. Some medications can make the condition worse or happen again.  · If blood thinners were prescribed by your doctor, take them exactly as told. Too much can cause bleeding. Too little and you will not have the needed protection against stroke and other problems.  · Perform blood tests at home if told by your doctor.  · Perform blood tests exactly as told by your doctor.  · Do not drink alcohol.  · Do not drink beverages with caffeine such as coffee, soda, and some teas.  · Maintain a healthy weight.  · Do not use diet pills unless your doctor says they are safe. They may make heart problems worse.  · Follow diet instructions as told by your doctor.  · Exercise regularly as told by your doctor.  · Keep all follow-up appointments.  GET HELP IF:  · You notice a change in the speed, rhythm, or strength of your heartbeat.  · You suddenly begin peeing (urinating) more often.  · You get tired more easily when moving or exercising.  GET HELP RIGHT AWAY IF:   · You have chest or belly (abdominal) pain.  · You feel sick to your stomach (nauseous).  · You are short of breath.  · You suddenly have swollen feet and ankles.  · You feel dizzy.  · You face, arms, or legs feel numb or weak.  · There is a change in your vision or speech.  MAKE SURE YOU:   · Understand these instructions.  · Will watch your condition.  · Will get help right away if you are not doing well or get worse.  Document Released: 07/26/2008 Document Revised: 03/03/2014 Document Reviewed: 11/27/2012  ExitCare® Patient Information ©2015 [employer]. This information is not  intended to replace advice given to you by your health care provider. Make sure you discuss any questions you have with your health care provider.

## 2015-02-20 NOTE — Discharge Summary (Signed)
Physician Discharge Summary  Natasha Martin:[id number] DOB: 06/01/1961 DOA: 02/11/2015  PCP: PROVIDER NOT IN SYSTEM  Cardiologist: Dr. Mare Ferrari  Admit date: 02/11/2015 Discharge date: 02/20/2015   Recommendations for Outpatient Follow-Up:   1. On amiodarone, haldol, seroquel and trazodone (dosages reduced from home dosages) which can prolong QTc interval.  Monitor closely. 2. Long-term strategy of management of atrial flutter will be to have her on adequate anticoagulation for 4 weeks with good documentation of adequate INRs and then consider outpatient direct current cardioversion if she remains in atrial flutter. 3. Please schedule GI follow-up with Dr. Barney Drain for ischemic colitis.Natasha Martin  4. Monitor INR daily until > 2, D/C Lovenox when INR > 2 x 48 hours.  Monitor CBC weekly.   Discharge Diagnosis:   Principal Problem:    Acute ischemic colitis Active Problems:    Atrial fibrillation with RVR    GI bleed    Abdominal distention    Noninfectious gastroenteritis and colitis    Diabetes mellitus type 2 in nonobese    Acute encephalopathy    Ileus    Atrial flutter with rapid ventricular response    Hyponatremia secondary to SA IDH    Hypokalemia    Hypophosphatemia    Elevated d-dimer    Schizophrenia    Atrial fibrillation   Discharge disposition:  SNF:  [facility] in [place].  Discharge Condition: Improved.  Diet recommendation: Low sodium, heart healthy, carb modified.  1200 cc fluid restriction/24 hours.  Wound care: None.   History of Present Illness:   Natasha Martin is an 54 y.o. female with PMH of developmental delay, seizures, schizophrenia, hypertension and diabetes who was admitted to an [facility] 02/08/15 with altered mental status. CT abdomen pelvis without contrast was done for distended abdomen (without contrast given elevated creatinine of 1.9), and showed wall thickening from the mid transverse to descending colon. Her  hospital course was complicated by atrial fibrillation with RVR and she was transferred to [facility] [place] on 02/11/15. She is currently being treated for ischemic colitis and cardiology is following for her A. fib with RVR.   Hospital Course by Problem:   Principal Problem:  Acute ischemic colitis / GI bleed / abdominal distention / noninfectious gastroenteritis and colitis - Colitis thought to be ischemic. Managed IV Zosyn, which was discontinued 02/16/15 after 6 days of therapy.  - GI evaluated and signed off 02/15/15 with recommendations for outpatient GI follow-up with Dr. Barney Drain.  - Resolving, diet advanced to soft foods, which she is tolerating. - Hemoglobin remains stable and is trending up.  Active Problems:  Hyponatremia secondary to SIADH - Sodium stable at 130. Continue fluid restriction.   Atrial fibrillation/flutter with RVR - New onset. TSH WNL. - Managed by cardiology: initially on Cardizem and amiodarone drip.  Transitioned to oral therapy 02/17/15: Amiodarone, digoxin and metoprolol.  D/C digoxin per cardiology recs. - 2-D echo showed EF 60-65%.  - Continue Lovenox/coumadin given CHADS2vasc score of 3. Monitor H&H closely.  D/C Lovenox when INR therapeutic x 48 hours.   Acute encephalopathy - Most likely metabolic in the setting of hyponatremia, intra-abdominal process, with possible polypharmacy. - CT head without contrast 02/11/15 negative for acute findings. - EEG negative for seizures, consistent with encephalopathy. - Resolved,now at baseline per family.   Ileus - Initially managed with NG tube/gastric decompression. Resolved. Tolerating diet.   Hypokalemia/hypophosphatemia - Resolved with supplementation..   Elevated d-dimer - VTE ruled out with CT chest and lower  extremity Dopplers.   Schizophrenia  - Usual home medications including Seroquel, carbamazepine, Haldol and trazodone resumed (lower dose). Invega on hold. - Family advised of the  potential interaction of amiodarone with her psychiatry medications including Haldol and risk of cardiac arrhythmias with this combination.   Poorly controlled type 2 diabetes  - Resume Glyburide, Amaryl at D/C.  Continue Lantus.    Medical Consultants:    Dr. Lucio Edward, GI  Dr. Lauree Chandler, Cardiology  Dr. Scherrie Merritts, Surgery  Dr. Simonne Maffucci, PCCM   Discharge Exam:   Filed Vitals:   02/20/15 0553  BP: 125/63  Pulse: 119  Temp: 98.6 F (37 C)  Resp: 20   Filed Vitals:   02/19/15 1200 02/19/15 1332 02/19/15 2112 02/20/15 0553  BP: 130/66 121/69 132/84 125/63  Pulse:  121 122 119  Temp: 98.3 F (36.8 C) 98.2 F (36.8 C) 98.6 F (37 C) 98.6 F (37 C)  TempSrc: Oral Oral Oral Oral  Resp: _0 Height:      Weight:  71.623 kg (157 lb 14.4 oz)    SpO2: 98% 100% 100% 99%    Gen:  NAD Cardiovascular:  RRR, No M/R/G Respiratory: Lungs CTAB Gastrointestinal: Abdomen soft, NT/ND with normal active bowel sounds. Extremities: No C/E/C   The results of significant diagnostics from this hospitalization (including imaging, microbiology, ancillary and laboratory) are listed below for reference.     Procedures and Diagnostic Studies:   Ct Head Wo Contrast 02/11/2015: Normal head CT. No change since 2 days ago.   Ct Abdomen Pelvis W Contrast 02/11/2015: 1. Colitis extending from the mid transverse colon through the rectum. Findings may be infectious or inflammatory. There is no perforation or abscess. 2. Dilated and fluid-filled small bowel loops in the central and lower abdomen. This may be reactive ileus versus small bowel enteritis. 3. Small amount of free fluid in the pelvis and perisplenic space.   Dg Chest Port 1 View 02/11/2015: Tube and catheter positions as described without pneumothorax. No edema or consolidation.   Ct Angio Chest Pe W/cm &/or Wo Cm 02/12/2015: No evidence pulmonary embolism. Tiny amount of right pleural fluid minimal  right basilar atelectasis. Linear scarring left base. Mild cardiomegaly. Right-sided aortic arch with aberrant left subclavian artery.   Dg Abd Acute W/chest 02/13/2015: Persistent dilated small bowel and right colon likely due to a functional obstruction related to severe colitis beginning in the mid transverse colon. No definite free air. Stable bibasilar atelectasis.    Labs:   Basic Metabolic Panel:  Recent Labs Lab 02/14/15 0545 02/15/15 0420 02/16/15 0600 02/17/15 0415 02/18/15 0515 02/19/15 0647 02/20/15 0550  NA 137 132* 125* 129* 130* 130* 133*  K 3.2* 3.8 4.7 4.8 4.2 4.2 4.3  CL 99 100 98 98 96 96 96  CO2 _1 GLUCOSE 190* 273* 208* 253* 162* 130* 181*  BUN _2 CREATININE 0.61 0.55 0.59 0.61 0.63 0.61 0.71  CALCIUM 7.4* 7.5* 7.4* 7.9* 7.9* 8.0* 8.3*  MG 1.9 1.6 1.6  --   --   --   --   PHOS 1.8* 2.0* 2.6  --   --   --   --    GFR Estimated Creatinine Clearance: 74.4 mL/min (by C-G formula based on Cr of 0.71). Liver Function Tests:  Recent Labs Lab 02/15/15 0420 02/16/15 0600  AST 14 15  ALT 10 12  ALKPHOS 50  56  BILITOT 0.5 0.5  PROT 5.0* 5.0*  ALBUMIN 2.4* 2.5*   Coagulation profile  Recent Labs Lab 02/19/15 1405 02/20/15 0550  INR 0.99 1.10    CBC:  Recent Labs Lab 02/15/15 0420 02/15/15 1105 02/16/15 0600 02/17/15 0415 02/18/15 0515 02/20/15 0550  WBC 5.9  --  12.3* 6.9 6.1 5.2  NEUTROABS 3.7  --  10.2*  --   --   --   HGB 9.8* 10.3* 9.1* 8.9* 9.6* 10.0*  HCT 28.0* 29.9* 26.6* 26.2* 28.0* 29.5*  MCV 91.8  --  91.7 91.9 91.2 92.2  PLT 197  --  198 204 214 246   CBG:  Recent Labs Lab 02/19/15 0727 02/19/15 1120 02/19/15 1733 02/19/15 2110 02/20/15 0707  GLUCAP 160* 153* 226* 297* 206*   Microbiology Recent Results (from the past 240 hour(s))  MRSA PCR Screening     Status: None   Collection Time: 02/11/15  8:12 PM  Result Value Ref Range Status   MRSA by PCR NEGATIVE NEGATIVE Final     Comment:        The GeneXpert MRSA Assay (FDA approved for NASAL specimens only), is one component of a comprehensive MRSA colonization surveillance program. It is not intended to diagnose MRSA infection nor to guide or monitor treatment for MRSA infections.   Clostridium Difficile by PCR     Status: None   Collection Time: 02/17/15  9:47 PM  Result Value Ref Range Status   C difficile by pcr NEGATIVE NEGATIVE Final     Discharge Instructions:       Discharge Instructions    Call MD for:  extreme fatigue    Complete by:  As directed      Call MD for:  persistant dizziness or light-headedness    Complete by:  As directed      Call MD for:  persistant nausea and vomiting    Complete by:  As directed      Diet - low sodium heart healthy    Complete by:  As directed      Diet Carb Modified    Complete by:  As directed      Discharge instructions    Complete by:  As directed   Amiodarone can interact with some of your psychiatric medications and cause problems with your heart rhythm. Monitor closely for palpitations, dizziness, passing out spells.     Increase activity slowly    Complete by:  As directed      Walk with assistance    Complete by:  As directed      Walker     Complete by:  As directed             Medication List    STOP taking these medications        atenolol 25 MG tablet  Commonly known as:  TENORMIN     nortriptyline 50 MG capsule  Commonly known as:  PAMELOR     paliperidone 6 MG 24 hr tablet  Commonly known as:  INVEGA     ramipril 10 MG capsule  Commonly known as:  ALTACE     sodium chloride 1 G tablet      TAKE these medications        acetaminophen 325 MG tablet  Commonly known as:  TYLENOL  Take 650 mg by mouth every 6 (six) hours as needed for moderate pain.     amiodarone 200 MG tablet  Commonly known as:  PACERONE  Take 1 tablet (200 mg total) by mouth 2 (two) times daily.     aspirin 81 MG tablet  Take 81 mg by mouth  daily.     benztropine 2 MG tablet  Commonly known as:  COGENTIN  Take 2 mg by mouth 2 (two) times daily.     calcium-vitamin D 500-200 MG-UNIT per tablet  Commonly known as:  OSCAL WITH D  Take 1 tablet by mouth daily.     carbamazepine 200 MG tablet  Commonly known as:  TEGRETOL  Take 400 mg by mouth 2 (two) times daily.     clotrimazole-betamethasone cream  Commonly known as:  LOTRISONE  Apply 1 application topically daily as needed (dry skin and rashes).     CRESTOR 20 MG tablet  Generic drug:  rosuvastatin  Take 20 mg by mouth daily.     diazepam 10 MG tablet  Commonly known as:  VALIUM  Take 1 tablet (10 mg total) by mouth daily.     enoxaparin 150 MG/ML injection  Commonly known as:  LOVENOX  Inject 0.49 mLs (75 mg total) into the skin every 12 (twelve) hours.     feeding supplement (GLUCERNA SHAKE) Liqd  Take 237 mLs by mouth 2 (two) times daily between meals.     fenofibrate 145 MG tablet  Commonly known as:  TRICOR  Take 145 mg by mouth daily.     glimepiride 4 MG tablet  Commonly known as:  AMARYL  Take 4 mg by mouth 2 (two) times daily.     glyBURIDE 5 MG tablet  Commonly known as:  DIABETA  Take 2.5 mg by mouth 2 (two) times daily with a meal.     haloperidol 10 MG tablet  Commonly known as:  HALDOL  Take 1 tablet (10 mg total) by mouth 2 (two) times daily.     insulin aspart 100 UNIT/ML injection  Commonly known as:  novoLOG  Inject 8 Units into the skin 4 (four) times daily. Give 8 units QID as long as Blood Sugar is above 150.     insulin glargine 100 UNIT/ML injection  Commonly known as:  LANTUS  Inject 20 Units into the skin at bedtime.     LORazepam 1 MG tablet  Commonly known as:  ATIVAN  Take 1 tablet (1 mg total) by mouth 2 (two) times daily as needed for anxiety (anxiety).     metoprolol succinate 100 MG 24 hr tablet  Commonly known as:  TOPROL-XL  Take 1 tablet (100 mg total) by mouth 2 (two) times daily before a meal. Take with  or immediately following a meal.     multivitamins ther. w/minerals Tabs tablet  Take 1 tablet by mouth daily.     OMEGA-3 FISH OIL PO  Take 1,000 mg by mouth 3 (three) times daily.     omeprazole 20 MG capsule  Commonly known as:  PRILOSEC  1 po bid 30 minutes before meals for 3 mos then once daily FOREVER     ondansetron 4 MG tablet  Commonly known as:  ZOFRAN  Take 1 tablet (4 mg total) by mouth every 6 (six) hours as needed for nausea.     QUEtiapine 200 MG tablet  Commonly known as:  SEROQUEL  Take 3 tablets (600 mg total) by mouth at bedtime.     traZODone 50 MG tablet  Commonly known as:  DESYREL  Take 1 tablet (50 mg total) by mouth at bedtime as needed  for sleep.     warfarin 5 MG tablet  Commonly known as:  COUMADIN  Take 1 tablet (5 mg total) by mouth daily.          Time coordinating discharge: 35 minutes.  Signed:  Jailynne Opperman  Pager [phone] [facility] 02/20/2015, 10:42 AM

## 2015-02-20 NOTE — Progress Notes (Signed)
   02/20/15 1000  Clinical Encounter Type  Visited With Patient  Visit Type Initial;Spiritual support  Referral From Nurse  Consult/Referral To Chaplain  Stress Factors  Patient Stress Factors Loss;Lack of knowledge   Chaplain referred for patient support.  Chaplain provided with coloring materials and provided bedside support with pt.    [name] spoke of the loss of her father a couple years prior.  She became tearful briefly when discussing father.  She described seeing him when she sleeps and seeing him in the moon.  Because of this, she fears he is "not resting peacefully"  Chaplain provided support, normalized this grief response, engaged with [name] in speaking about her father's care for her.  [name] recently experienced her father as "telling her to get to the hospital because something is wrong."  She was able take joy in his continued care for her.    [name] requested prayers and a bible, as she wants to read scripture to her sister, April.     Burnis KingfisherStalnaker, Eisley Barber [place] MDiv  [phone]

## 2015-02-20 NOTE — Progress Notes (Signed)
Patient discharge [facility], alert and oriented, discharge package given to [facility] for delivery to SNF, patient in stable condition at this time

## 2015-02-20 NOTE — Progress Notes (Signed)
CSW left voicemail for Pt's [place], [place] informing her about discharge time and transportation.   [facility] at [facility]. CSW to arrange transportation with [facility].  Chad CordialLauren Carter, LCSWA 02/20/2015 11:00 AM [phone]

## 2015-02-20 NOTE — Progress Notes (Signed)
Patient Name: Natasha Martin Date of Encounter: 02/20/2015     Principal Problem:   Acute ischemic colitis Active Problems:   Atrial fibrillation with RVR   GI bleed   Abdominal distention   Noninfectious gastroenteritis and colitis   Diabetes mellitus type 2 in nonobese   Acute encephalopathy   Ileus   Atrial flutter with rapid ventricular response   Hyponatremia secondary to SA IDH   Hypokalemia   Hypophosphatemia   Elevated d-dimer   Schizophrenia   Atrial fibrillation    SUBJECTIVE  The patient is in no acute distress.  Denies chest pain or shortness of breath.  He remains in atrial flutter with rapid ventricular response of 117/min  CURRENT MEDS . amiodarone  200 mg Oral BID  . benztropine  2 mg Oral BID  . carbamazepine  400 mg Oral BID  . enoxaparin (LOVENOX) injection  100 mg Subcutaneous Once  . enoxaparin (LOVENOX) injection  1 mg/kg Subcutaneous Q12H  . famotidine  20 mg Oral Daily  . feeding supplement (GLUCERNA SHAKE)  237 mL Oral BID BM  . fenofibrate  54 mg Oral Daily  . haloperidol  10 mg Oral BID  . insulin aspart  0-15 Units Subcutaneous TID WC  . insulin aspart  0-5 Units Subcutaneous QHS  . insulin aspart  4 Units Subcutaneous TID WC  . insulin glargine  30 Units Subcutaneous Daily  . metoprolol succinate  100 mg Oral BID AC  . nystatin   Topical BID  . QUEtiapine  600 mg Oral QHS  . rosuvastatin  20 mg Oral Daily  . traZODone  50 mg Oral QHS  . Warfarin - Pharmacist Dosing Inpatient   Does not apply q1800    OBJECTIVE  Filed Vitals:   02/19/15 1200 02/19/15 1332 02/19/15 2112 02/20/15 0553  BP: 130/66 121/69 132/84 125/63  Pulse:  121 122 119  Temp: 98.3 F (36.8 C) 98.2 F (36.8 C) 98.6 F (37 C) 98.6 F (37 C)  TempSrc: Oral Oral Oral Oral  Resp: _0 Height:      Weight:  157 lb 14.4 oz (71.623 kg)    SpO2: 98% 100% 100% 99%    Intake/Output Summary (Last 24 hours) at 02/20/15 0854 Last data filed at 02/19/15 0924  Gross per 24 hour  Intake      0 ml  Output    200 ml  Net   -200 ml   Filed Weights   02/16/15 0400 02/17/15 0700 02/19/15 1332  Weight: 162 lb 7.7 oz (73.7 kg) 162 lb 7.7 oz (73.7 kg) 157 lb 14.4 oz (71.623 kg)    PHYSICAL EXAM    General: Pleasant, NAD. Speech impediment. Difficult to understand at times. Neuro: Alert and oriented X 3. Moves all extremities spontaneously. Psych: Normal affect. HEENT: Normal Neck: Supple without bruits or JVD. Lungs: Resp regular and unlabored, CTA. Heart: Irregularly irregular no s3, s4, or murmurs. Abdomen: Soft, non-tender, non-distended, soft bowel sounds. Extremities: No clubbing, cyanosis or edema. DP/PT/Radials 2+ and equal bilaterally.  CBC  Recent Labs  02/18/15 0515 02/20/15 0550  WBC 6.1 5.2  HGB 9.6* 10.0*  HCT 28.0* 29.5*  MCV 91.2 92.2  PLT 214 161   Basic Metabolic Panel  Recent Labs  02/19/15 0647 02/20/15 0550  NA 130* 133*  K 4.2 4.3  CL 96 96  CO2 25 25  GLUCOSE 130* 181*  BUN 7 6  CREATININE 0.61 0.71  CALCIUM 8.0* 8.3*  Liver Function Tests No results for input(s): AST, ALT, ALKPHOS, BILITOT, PROT, ALBUMIN in the last 72 hours. No results for input(s): LIPASE, AMYLASE in the last 72 hours. Cardiac Enzymes No results for input(s): CKTOTAL, CKMB, CKMBINDEX, TROPONINI in the last 72 hours. BNP Invalid input(s): POCBNP D-Dimer No results for input(s): DDIMER in the last 72 hours. Hemoglobin A1C No results for input(s): HGBA1C in the last 72 hours. Fasting Lipid Panel No results for input(s): CHOL, HDL, LDLCALC, TRIG, CHOLHDL, LDLDIRECT in the last 72 hours. Thyroid Function Tests No results for input(s): TSH, T4TOTAL, T3FREE, THYROIDAB in the last 72 hours.  Invalid input(s): FREET3  TELE  Atrial flutter with rapid ventricular response  ECG    Radiology/Studies  Ct Head Wo Contrast  02/11/2015   CLINICAL DATA:  Altered mental status today.  EXAM: CT HEAD WITHOUT CONTRAST   TECHNIQUE: Contiguous axial images were obtained from the base of the skull through the vertex without intravenous contrast.  COMPARISON:  02/09/2015  FINDINGS: The ventricles are normal in size and configuration. No extra-axial fluid collections are identified. The gray-white differentiation is normal. No CT findings for acute intracranial process such as hemorrhage or infarction. No mass lesions. The brainstem and cerebellum are grossly normal.  The bony structures are intact. The paranasal sinuses and mastoid air cells are clear. The globes are intact.  IMPRESSION: Normal head CT.  No change since 2 days ago.   Electronically Signed   By: Marijo Sanes M.D.   On: 02/11/2015 21:44   Ct Angio Chest Pe W/cm &/or Wo Cm  02/12/2015   CLINICAL DATA:  Tachycardia with elevated D-dimer.  EXAM: CT ANGIOGRAPHY CHEST WITH CONTRAST  TECHNIQUE: Multidetector CT imaging of the chest was performed using the standard protocol during bolus administration of intravenous contrast. Multiplanar CT image reconstructions and MIPs were obtained to evaluate the vascular anatomy.  CONTRAST:  184m OMNIPAQUE IOHEXOL 350 MG/ML SOLN  COMPARISON:  Chest x-ray 02/11/2015 and CT abdomen 02/11/2015  FINDINGS: The lungs are adequately inflated with minimal linear scarring over the left lower lobe. There is a tiny amount of right pleural fluid and associated atelectasis. Airways are within normal.  There is mild prominence of the heart. There is a sub cm lymph node over the left pericardial phrenic angle. Pulmonary arterial system is normal without evidence of emboli. Right-sided aortic arch is present with aberrant left subclavian artery coursing posterior to the esophagus. Nasogastric tube is present. There is no mediastinal, hilar or axillary adenopathy.  Images through the upper abdomen demonstrate minimal diffuse hepatic steatosis. Tiny amount of perihepatic fluid unchanged. Remainder of the exam is unchanged.  Review of the MIP images  confirms the above findings.  IMPRESSION: No evidence pulmonary embolism.  Tiny amount of right pleural fluid minimal right basilar atelectasis. Linear scarring left base.  Mild cardiomegaly. Right-sided aortic arch with aberrant left subclavian artery.   Electronically Signed   By: DMarin OlpM.D.   On: 02/12/2015 17:25   Ct Abdomen Pelvis W Contrast  02/11/2015   CLINICAL DATA:  Abdominal pain and distention, decreased bowel sounds.  EXAM: CT ABDOMEN AND PELVIS WITH CONTRAST  TECHNIQUE: Multidetector CT imaging of the abdomen and pelvis was performed using the standard protocol following bolus administration of intravenous contrast.  CONTRAST:  1069mOMNIPAQUE IOHEXOL 300 MG/ML  SOLN  COMPARISON:  Radiographs 1 day prior, CT abdomen 2 days prior.  FINDINGS: Bibasilar atelectasis, minimal pleural thickening dependently.  Enteric tube in place, tip in the  duodenum. There is fluid distending the stomach. Diffusely fluid-filled small bowel with small bowel dilatation, measuring up to 4.9 cm. No definite transition point, the distal small bowel loops are decompressed. There is diffuse colonic wall thickening with surrounding perienteric inflammatory change from the mid transverse colon to the rectum. The distal colon is fluid-filled. The more proximal colon is normal in caliber with small volume of stool. The appendix is normal.  No free air or pneumatosis. Small amount of free fluid in the perisplenic space and pelvis is likely reactive. The mesenteric vessels appear patent.  No focal hepatic lesion. There is an elongated left hepatic lobe. The gallbladder is physiologically distended. The spleen and pancreas are unremarkable. Unchanged adrenal nodularity. Kidneys demonstrate symmetric enhancement and excretion. No hydronephrosis or perinephric stranding.  Abdominal aorta is normal in caliber, minimal atherosclerosis. No retroperitoneal adenopathy. Mild whole body wall edema.  Within the pelvis the urinary  bladder is decompressed by Foley catheter. Uterus remains in situ, atrophic, normal for age. No adnexal mass.  There are no acute or suspicious osseous abnormalities. Remote lower left rib fractures.  IMPRESSION: 1. Colitis extending from the mid transverse colon through the rectum. Findings may be infectious or inflammatory. There is no perforation or abscess. 2. Dilated and fluid-filled small bowel loops in the central and lower abdomen. This may be reactive ileus versus small bowel enteritis. 3. Small amount of free fluid in the pelvis and perisplenic space.   Electronically Signed   By: Jeb Levering M.D.   On: 02/11/2015 21:53   Dg Chest Port 1 View  02/11/2015   CLINICAL DATA:  Central catheter placement  EXAM: PORTABLE CHEST - 1 VIEW  COMPARISON:  February 10, 2015  FINDINGS: Central catheter tip is at the cavoatrial junction. Nasogastric tube tip and side port are in the distal stomach. No pneumothorax. There is no edema or consolidation. Heart is upper normal in size with pulmonary vascularity within normal limits. No adenopathy.  IMPRESSION: Tube and catheter positions as described without pneumothorax. No edema or consolidation.   Electronically Signed   By: Lowella Grip III M.D.   On: 02/11/2015 17:33   Dg Abd Acute W/chest  02/13/2015   CLINICAL DATA:  Altered mental status. Abdominal distention. Colitis.  EXAM: DG ABDOMEN ACUTE W/ 1V CHEST  COMPARISON:  CT examinations 414 and 02/11/2015  FINDINGS: Upright chest x-ray: The right IJ central venous catheter tip is in distal SVC near the cavoatrial junction. This is stable. The heart is normal in size. Low lung volumes with vascular crowding and bibasilar atelectasis. The NG tube is stable.  Two views of the abdomen demonstrate diffusely distended small bowel loops and right colon. Suspect functional obstruction due to severe colitis beginning in the mid transverse colon  IMPRESSION: Persistent dilated small bowel and right colon likely due to  a functional obstruction related to severe colitis beginning in the mid transverse colon. No definite free air.  Stable bibasilar atelectasis.   Electronically Signed   By: Marijo Sanes M.D.   On: 02/13/2015 15:33    ASSESSMENT AND PLAN  1. Newly documented atrial flutter with RVR, duration uncertain.Marland Kitchen  CHADSVASC score is 3. She is now taking PO's.  Ejection fraction is normal at 60-65% by echo  2. Colitis, infectious versus ischemic. She is being managed by gastroenterology.  Rectal bleeding appears to have stopped.we will begin anticoagulation and watch closely.  Because of her psychiatric medications and drug interactions, she is not a candidate for any  anticoagulation other than warfarin.   3. Ileus, improved.  4. History of GI bleeding.  Hemoglobin rising.  No further GI bleeding noted.  5. History of schizophrenia and developmental mental delays.  6. Hyponatremia  Plan: Okay for discharge to skilled nursing facility today.  She will receive Lovenox as per pharmacy recommendations until her INR is therapeutic.. I will stop her digoxin at this point.  It is likely to cause problems in the future with interactions with amiodarone etc. and does not appear to be helping much in terms of heart rate control.  We will plan to send her out on amiodarone 200 mg twice a day.  Continue beta blocker. Long-term strategy will be to have her on adequate anticoagulation for 4 weeks with good documentation of adequate INRs and then consider outpatient direct current cardioversion if she remains in atrial flutter.  Return to see Dr. Mare Ferrari or nurse practitioner/PA in about 2 weeks for follow-up office visit.  Signed, Darlin Coco MD

## 2015-02-20 NOTE — Progress Notes (Signed)
BRIEF NUTRITION NOTE  Pt to be seen for follow-up today. Pt with d/c summary in late this AM. Pt has been eating 75-100% on soft diet which is meeting needs. If pt unable to d/c, will see for full follow-up assessment and note 02/23/15.  Trenton GammonJessica Damia Bobrowski, RD, LDN Inpatient Clinical Dietitian Pager # [phone] After hours/weekend pager # [phone]

## 2015-02-23 DIAGNOSIS — K922 Gastrointestinal hemorrhage, unspecified: Secondary | ICD-10-CM | POA: Diagnosis not present

## 2015-02-23 DIAGNOSIS — Z7901 Long term (current) use of anticoagulants: Secondary | ICD-10-CM | POA: Diagnosis not present

## 2015-02-23 DIAGNOSIS — I4891 Unspecified atrial fibrillation: Secondary | ICD-10-CM | POA: Diagnosis not present

## 2015-02-23 DIAGNOSIS — R791 Abnormal coagulation profile: Secondary | ICD-10-CM | POA: Diagnosis not present

## 2015-03-04 ENCOUNTER — Ambulatory Visit: Payer: Medicare Other | Admitting: Gastroenterology

## 2015-03-09 DIAGNOSIS — R791 Abnormal coagulation profile: Secondary | ICD-10-CM | POA: Diagnosis not present

## 2015-03-13 ENCOUNTER — Encounter: Payer: Self-pay | Admitting: Gastroenterology

## 2015-03-13 ENCOUNTER — Ambulatory Visit (INDEPENDENT_AMBULATORY_CARE_PROVIDER_SITE_OTHER): Payer: Medicare Other | Admitting: Gastroenterology

## 2015-03-13 ENCOUNTER — Ambulatory Visit: Payer: Medicare Other | Admitting: Gastroenterology

## 2015-03-13 VITALS — BP 111/72 | HR 113 | Temp 97.6°F | Ht 63.0 in | Wt 161.8 lb

## 2015-03-13 DIAGNOSIS — K550 Acute vascular disorders of intestine: Secondary | ICD-10-CM

## 2015-03-13 DIAGNOSIS — K625 Hemorrhage of anus and rectum: Secondary | ICD-10-CM | POA: Diagnosis not present

## 2015-03-13 DIAGNOSIS — K55039 Acute (reversible) ischemia of large intestine, extent unspecified: Secondary | ICD-10-CM

## 2015-03-13 NOTE — Progress Notes (Signed)
Primary Care Physician: [facility]  Primary Gastroenterologist:  Jonette Eva, MD   Chief Complaint  Patient presents with  . Follow-up    HPI: Natasha Martin is a 54 y.o. female here for hospital follow-up. Recently hospitalized with mental status changes. Initially at [facility] but required transfer to [facility] long. She had a CT of the head which was negative, negative toxicology panel. CT of the abdomen and pelvis without contrast (due to elevated creatinine on admission) was done for distended abdomen. She had continuous wall thickening extending from mid transverse to descending colon. Patient developed A. fib with RVR started on amiodarone, Cardizem drip. Patient had positive d-dimer, melena on exam per report. She had ongoing deterioration of her mental status and was transferred to [facility] long from [facility]. Transport notes also indicated coffee ground dark emesis in the NG tube. Seen by Dr. Russella Dar ([facility]), during hospitalization. No endoscopic procedures done. Recent EGD/TCS in 2015 with normal terminal ileum, one rectal polyp, small internal and external hemorrhoids, reflux esophagitis, GE junction stricture, moderate erosive gastritis with benign biopsies.  It was felt that she had ischemic colitis with associated ileus. She was ultimately anticoagulated for A. fib with RVR. On Coumadin at this point. She required rehabilitation at the [facility] but was able to go back to her group home yesterday. Patient's sister is with her today. She is the Interior and spatial designer of nursing at the [employer]. She states that there has been no reported GI issues or concerns although the patient has somewhat of an unreliable history states that she has had some rectal bleeding, small amount. Denies black stools. Complains of gas but otherwise denies abdominal pain. Patient has been eating very well without any vomiting or complaints of heartburn.  No dysphagia. She has appointment with her PCP today for follow-up. Patient's sister states she had her hemoglobin checked about 3 weeks ago when she will fax [place] the report. Sister reports that the patient has been doing remarkably well.  Patient reports 3 soft bowel movements daily.   Patient did have a CT A/P with contrast on 02/11/15 at [facility]. See below for details.    Current Outpatient Prescriptions  Medication Sig Dispense Refill  . acetaminophen (TYLENOL) 325 MG tablet Take 650 mg by mouth every 6 (six) hours as needed for moderate pain.    Marland Kitchen amiodarone (PACERONE) 200 MG tablet Take 1 tablet (200 mg total) by mouth 2 (two) times daily.    Marland Kitchen aspirin 81 MG tablet Take 81 mg by mouth daily.    Marland Kitchen atorvastatin (LIPITOR) 40 MG tablet Take 40 mg by mouth daily.    . benztropine (COGENTIN) 2 MG tablet Take 2 mg by mouth 2 (two) times daily.     . calcium-vitamin D (OSCAL WITH D) 500-200 MG-UNIT per tablet Take 1 tablet by mouth daily.    . carbamazepine (TEGRETOL) 200 MG tablet Take 400 mg by mouth 2 (two) times daily.     . clotrimazole-betamethasone (LOTRISONE) cream Apply 1 application topically daily as needed (dry skin and rashes).     . CRESTOR 20 MG tablet Take 20 mg by mouth daily.     . diazepam (VALIUM) 10 MG tablet Take 1 tablet (10 mg total) by mouth daily. 30 tablet 0  . fenofibrate (TRICOR) 145 MG tablet Take 145 mg by mouth daily.     Marland Kitchen glimepiride (AMARYL) 4 MG tablet Take 4 mg  by mouth 2 (two) times daily.     Marland Kitchen. glyBURIDE (DIABETA) 5 MG tablet Take 2.5 mg by mouth 2 (two) times daily with a meal.     . haloperidol (HALDOL) 10 MG tablet Take 1 tablet (10 mg total) by mouth 2 (two) times daily.    . insulin aspart (NOVOLOG) 100 UNIT/ML injection Inject 8 Units into the skin 4 (four) times daily. Give 8 units QID as long as Blood Sugar is above 150.    Marland Kitchen. insulin glargine (LANTUS) 100 UNIT/ML injection Inject 20 Units into the skin at bedtime.    Marland Kitchen. LORazepam (ATIVAN) 1 MG  tablet Take 1 tablet (1 mg total) by mouth 2 (two) times daily as needed for anxiety (anxiety). 30 tablet 0  . metoprolol succinate (TOPROL-XL) 100 MG 24 hr tablet Take 1 tablet (100 mg total) by mouth 2 (two) times daily before a meal. Take with or immediately following a meal.    . Multiple Vitamins-Minerals (MULTIVITAMINS THER. W/MINERALS) TABS tablet Take 1 tablet by mouth daily.    . Omega-3 Fatty Acids (OMEGA-3 FISH OIL PO) Take 1,000 mg by mouth 3 (three) times daily.    Marland Kitchen. omeprazole (PRILOSEC) 20 MG capsule 1 po bid 30 minutes before meals for 3 mos then once daily FOREVER (Patient taking differently: Take 20 mg by mouth daily. ) 60 capsule 11  . ondansetron (ZOFRAN) 4 MG tablet Take 1 tablet (4 mg total) by mouth every 6 (six) hours as needed for nausea. 20 tablet 0  . QUEtiapine (SEROQUEL) 200 MG tablet Take 3 tablets (600 mg total) by mouth at bedtime.    . traZODone (DESYREL) 50 MG tablet Take 1 tablet (50 mg total) by mouth at bedtime as needed for sleep.    Marland Kitchen. warfarin (COUMADIN) 5 MG tablet Take 1 tablet (5 mg total) by mouth daily.     No current facility-administered medications for this visit.    Allergies as of 03/13/2015 - Review Complete 03/13/2015  Allergen Reaction Noted  . Sulfa antibiotics Rash 03/13/2015    ROS: ?unreliable  General: Negative for anorexia, weight loss, fever, chills, fatigue, weakness. ENT: Negative for hoarseness, difficulty swallowing , nasal congestion. CV: Negative for chest pain, angina, palpitations, dyspnea on exertion, peripheral edema.  Respiratory: Negative for dyspnea at rest, dyspnea on exertion, cough, sputum, wheezing.  GI: See history of present illness. GU:  Negative for dysuria, hematuria, urinary incontinence, urinary frequency, nocturnal urination.  Endo: Negative for unusual weight change.    Physical Examination:   BP 111/72 mmHg  Pulse 113  Temp(Src) 97.6 F (36.4 C)  Ht 5\' 3"  (1.6 m)  Wt 161 lb 12.8 oz (73.392 kg)   BMI 28.67 kg/m2  LMP 01/21/2014  General: Well-nourished, well-developed in no acute distress. Accompanied by sister Eyes: No icterus. Mouth: Oropharyngeal mucosa moist and pink , no lesions erythema or exudate. Lungs: Clear to auscultation bilaterally.  Heart: Regular rate and rhythm, no murmurs rubs or gallops.  Abdomen: Bowel sounds are normal, nontender, nondistended but obese, no hepatosplenomegaly or masses, no abdominal bruits or hernia , no rebound or guarding.   Extremities: No lower extremity edema. No clubbing or deformities. Neuro: Alert and oriented x 4   Skin: Warm and dry, no jaundice.   Psych: Alert and cooperative, normal mood and affect.  Labs:  Lab Results  Component Value Date   WBC 5.2 02/20/2015   HGB 10.0* 02/20/2015   HCT 29.5* 02/20/2015   MCV 92.2 02/20/2015  PLT 246 02/20/2015   Lab Results  Component Value Date   CREATININE 0.71 02/20/2015   BUN 6 02/20/2015   NA 133* 02/20/2015   K 4.3 02/20/2015   CL 96 02/20/2015   CO2 25 02/20/2015   Lab Results  Component Value Date   ALT 12 02/16/2015   AST 15 02/16/2015   ALKPHOS 56 02/16/2015   BILITOT 0.5 02/16/2015    Imaging Studies: Ct Head Wo Contrast  02/11/2015   CLINICAL DATA:  Altered mental status today.  EXAM: CT HEAD WITHOUT CONTRAST  TECHNIQUE: Contiguous axial images were obtained from the base of the skull through the vertex without intravenous contrast.  COMPARISON:  02/09/2015  FINDINGS: The ventricles are normal in size and configuration. No extra-axial fluid collections are identified. The gray-white differentiation is normal. No CT findings for acute intracranial process such as hemorrhage or infarction. No mass lesions. The brainstem and cerebellum are grossly normal.  The bony structures are intact. The paranasal sinuses and mastoid air cells are clear. The globes are intact.  IMPRESSION: Normal head CT.  No change since 2 days ago.   Electronically Signed   By: Rudie MeyerP.  Gallerani M.D.    On: 02/11/2015 21:44   Ct Angio Chest Pe W/cm &/or Wo Cm  02/12/2015   CLINICAL DATA:  Tachycardia with elevated D-dimer.  EXAM: CT ANGIOGRAPHY CHEST WITH CONTRAST  TECHNIQUE: Multidetector CT imaging of the chest was performed using the standard protocol during bolus administration of intravenous contrast. Multiplanar CT image reconstructions and MIPs were obtained to evaluate the vascular anatomy.  CONTRAST:  100mL OMNIPAQUE IOHEXOL 350 MG/ML SOLN  COMPARISON:  Chest x-ray 02/11/2015 and CT abdomen 02/11/2015  FINDINGS: The lungs are adequately inflated with minimal linear scarring over the left lower lobe. There is a tiny amount of right pleural fluid and associated atelectasis. Airways are within normal.  There is mild prominence of the heart. There is a sub cm lymph node over the left pericardial phrenic angle. Pulmonary arterial system is normal without evidence of emboli. Right-sided aortic arch is present with aberrant left subclavian artery coursing posterior to the esophagus. Nasogastric tube is present. There is no mediastinal, hilar or axillary adenopathy.  Images through the upper abdomen demonstrate minimal diffuse hepatic steatosis. Tiny amount of perihepatic fluid unchanged. Remainder of the exam is unchanged.  Review of the MIP images confirms the above findings.  IMPRESSION: No evidence pulmonary embolism.  Tiny amount of right pleural fluid minimal right basilar atelectasis. Linear scarring left base.  Mild cardiomegaly. Right-sided aortic arch with aberrant left subclavian artery.   Electronically Signed   By: Elberta Fortisaniel  Boyle M.D.   On: 02/12/2015 17:25   Ct Abdomen Pelvis W Contrast  02/11/2015   CLINICAL DATA:  Abdominal pain and distention, decreased bowel sounds.  EXAM: CT ABDOMEN AND PELVIS WITH CONTRAST  TECHNIQUE: Multidetector CT imaging of the abdomen and pelvis was performed using the standard protocol following bolus administration of intravenous contrast.  CONTRAST:  100mL  OMNIPAQUE IOHEXOL 300 MG/ML  SOLN  COMPARISON:  Radiographs 1 day prior, CT abdomen 2 days prior.  FINDINGS: Bibasilar atelectasis, minimal pleural thickening dependently.  Enteric tube in place, tip in the duodenum. There is fluid distending the stomach. Diffusely fluid-filled small bowel with small bowel dilatation, measuring up to 4.9 cm. No definite transition point, the distal small bowel loops are decompressed. There is diffuse colonic wall thickening with surrounding perienteric inflammatory change from the mid transverse colon to the rectum. The  distal colon is fluid-filled. The more proximal colon is normal in caliber with small volume of stool. The appendix is normal.  No free air or pneumatosis. Small amount of free fluid in the perisplenic space and pelvis is likely reactive. The mesenteric vessels appear patent.  No focal hepatic lesion. There is an elongated left hepatic lobe. The gallbladder is physiologically distended. The spleen and pancreas are unremarkable. Unchanged adrenal nodularity. Kidneys demonstrate symmetric enhancement and excretion. No hydronephrosis or perinephric stranding.  Abdominal aorta is normal in caliber, minimal atherosclerosis. No retroperitoneal adenopathy. Mild whole body wall edema.  Within the pelvis the urinary bladder is decompressed by Foley catheter. Uterus remains in situ, atrophic, normal for age. No adnexal mass.  There are no acute or suspicious osseous abnormalities. Remote lower left rib fractures.  IMPRESSION: 1. Colitis extending from the mid transverse colon through the rectum. Findings may be infectious or inflammatory. There is no perforation or abscess. 2. Dilated and fluid-filled small bowel loops in the central and lower abdomen. This may be reactive ileus versus small bowel enteritis. 3. Small amount of free fluid in the pelvis and perisplenic space.   Electronically Signed   By: Rubye Oaks M.D.   On: 02/11/2015 21:53   Dg Chest Port 1  View  02/11/2015   CLINICAL DATA:  Central catheter placement  EXAM: PORTABLE CHEST - 1 VIEW  COMPARISON:  February 10, 2015  FINDINGS: Central catheter tip is at the cavoatrial junction. Nasogastric tube tip and side port are in the distal stomach. No pneumothorax. There is no edema or consolidation. Heart is upper normal in size with pulmonary vascularity within normal limits. No adenopathy.  IMPRESSION: Tube and catheter positions as described without pneumothorax. No edema or consolidation.   Electronically Signed   By: Bretta Bang III M.D.   On: 02/11/2015 17:33   Dg Abd Acute W/chest  02/13/2015   CLINICAL DATA:  Altered mental status. Abdominal distention. Colitis.  EXAM: DG ABDOMEN ACUTE W/ 1V CHEST  COMPARISON:  CT examinations 414 and 02/11/2015  FINDINGS: Upright chest x-ray: The right IJ central venous catheter tip is in distal SVC near the cavoatrial junction. This is stable. The heart is normal in size. Low lung volumes with vascular crowding and bibasilar atelectasis. The NG tube is stable.  Two views of the abdomen demonstrate diffusely distended small bowel loops and right colon. Suspect functional obstruction due to severe colitis beginning in the mid transverse colon  IMPRESSION: Persistent dilated small bowel and right colon likely due to a functional obstruction related to severe colitis beginning in the mid transverse colon. No definite free air.  Stable bibasilar atelectasis.   Electronically Signed   By: Rudie Meyer M.D.   On: 02/13/2015 15:33

## 2015-03-13 NOTE — Patient Instructions (Signed)
1. Please fax a copy of last hemoglobin to [phone]. 2. Please call the patient has further rectal bleeding or complaints of abdominal pain. 3. She can try Gas-X as per label instructions or probiotic, both over-the-counter. 4. I will review CT scan with radiologist to determine if she needs to have any further imaging of the blood vessels that feed her intestines to rule out blockages.

## 2015-03-13 NOTE — Assessment & Plan Note (Signed)
54 year old female with recent hospitalization for altered mental status changes. Baseline developmental delay, seizure disorder although last seizure felt to be remote. Recent A. fib with RVR. Abdominal distention and "dark blood" in the stool. CT suggestive of ischemic colitis, followed by [name] during hospitalization. Patient has subsequently been anticoagulated. According the patient she has had small volume hematochezia, history somewhat unreliable. Per sister who is [name] with [facility] has been residing up until yesterday reports that there has been no issues with bleeding and patient has been doing well.  [name]. Given recent colonoscopy, further evaluation not needed a less she has ongoing symptoms. I will review CT scan with radiologist to determine if mesenteric vasculature was seen adequately enough on recent contrasted study. Otherwise we may offer CTA.  Last hemoglobin [facility] sister. If patient has any reported bleeding or recurrent GI issues they are to call us. Further recommendations to follow.

## 2015-03-18 DIAGNOSIS — Z7901 Long term (current) use of anticoagulants: Secondary | ICD-10-CM | POA: Diagnosis not present

## 2015-03-18 NOTE — Progress Notes (Signed)
cc'ed to pcp °

## 2015-03-20 NOTE — Progress Notes (Signed)
Please let patient sister know: 1. [facility] showed gemoglobin was 9.3, remains low but overall relatively stable. 2. I also reviewed her CT scan with the radiologist, Dr. Tyron RussellBoles no need for further imaging to rule out mesenteric ischemia because vessels were adequately seen on that particular study. 3. Recommend repeat CBC in four weeks.  4. Return OV in 4 months with Dr. Darrick PennaFields.

## 2015-03-23 ENCOUNTER — Other Ambulatory Visit: Payer: Self-pay

## 2015-03-23 DIAGNOSIS — D649 Anemia, unspecified: Secondary | ICD-10-CM

## 2015-03-23 NOTE — Progress Notes (Signed)
[facility] has been [facility]. Her sister, [place] Cochran, is the Chiropodistassistant director of nursing and she is in a meeting now. I will call back later to speak with her.

## 2015-03-23 NOTE — Progress Notes (Signed)
I spoke to pt's sister, [place] and informed her. Pt is back at the group home, [facility] @ [address], [place]. I also spoke to Far[place]Linda at the group home and she is aware I will mail the lab order there for the pt to do in four weeks. The owner is Glee ArvinLatoya and her phone number is [phone].

## 2015-03-26 NOTE — Progress Notes (Signed)
REMINDER IN EPIC TO FOLLOW UP WITH SF IN 4 MONTHS

## 2015-04-06 DIAGNOSIS — Z7901 Long term (current) use of anticoagulants: Secondary | ICD-10-CM | POA: Diagnosis not present

## 2015-04-08 DIAGNOSIS — E1140 Type 2 diabetes mellitus with diabetic neuropathy, unspecified: Secondary | ICD-10-CM | POA: Diagnosis not present

## 2015-04-08 DIAGNOSIS — E1151 Type 2 diabetes mellitus with diabetic peripheral angiopathy without gangrene: Secondary | ICD-10-CM | POA: Diagnosis not present

## 2015-04-28 DIAGNOSIS — Z7901 Long term (current) use of anticoagulants: Secondary | ICD-10-CM | POA: Diagnosis not present

## 2015-05-20 DIAGNOSIS — Z7901 Long term (current) use of anticoagulants: Secondary | ICD-10-CM | POA: Diagnosis not present

## 2015-06-01 ENCOUNTER — Encounter: Payer: Self-pay | Admitting: Gastroenterology

## 2015-06-08 DIAGNOSIS — Z0000 Encounter for general adult medical examination without abnormal findings: Secondary | ICD-10-CM | POA: Diagnosis not present

## 2015-06-08 DIAGNOSIS — Z7901 Long term (current) use of anticoagulants: Secondary | ICD-10-CM | POA: Diagnosis not present

## 2015-06-08 DIAGNOSIS — E119 Type 2 diabetes mellitus without complications: Secondary | ICD-10-CM | POA: Diagnosis not present

## 2015-06-08 DIAGNOSIS — I10 Essential (primary) hypertension: Secondary | ICD-10-CM | POA: Diagnosis not present

## 2015-06-17 DIAGNOSIS — Z7901 Long term (current) use of anticoagulants: Secondary | ICD-10-CM | POA: Diagnosis not present

## 2015-07-01 DIAGNOSIS — B353 Tinea pedis: Secondary | ICD-10-CM | POA: Diagnosis not present

## 2015-07-01 DIAGNOSIS — Z7901 Long term (current) use of anticoagulants: Secondary | ICD-10-CM | POA: Diagnosis not present

## 2015-07-01 DIAGNOSIS — B351 Tinea unguium: Secondary | ICD-10-CM | POA: Diagnosis not present

## 2015-07-09 DIAGNOSIS — E11319 Type 2 diabetes mellitus with unspecified diabetic retinopathy without macular edema: Secondary | ICD-10-CM | POA: Diagnosis not present

## 2015-07-15 DIAGNOSIS — Z7901 Long term (current) use of anticoagulants: Secondary | ICD-10-CM | POA: Diagnosis not present

## 2015-07-23 ENCOUNTER — Emergency Department (HOSPITAL_COMMUNITY): Payer: Medicare Other

## 2015-07-23 ENCOUNTER — Ambulatory Visit (INDEPENDENT_AMBULATORY_CARE_PROVIDER_SITE_OTHER): Payer: Medicare Other | Admitting: Gastroenterology

## 2015-07-23 ENCOUNTER — Encounter: Payer: Self-pay | Admitting: Gastroenterology

## 2015-07-23 ENCOUNTER — Encounter (HOSPITAL_COMMUNITY): Payer: Self-pay | Admitting: *Deleted

## 2015-07-23 ENCOUNTER — Emergency Department (HOSPITAL_COMMUNITY)
Admission: EM | Admit: 2015-07-23 | Discharge: 2015-07-23 | Disposition: A | Discharge status: Home / Self Care | Payer: Medicare Other | Attending: Emergency Medicine | Admitting: Emergency Medicine

## 2015-07-23 ENCOUNTER — Telehealth: Payer: Self-pay | Admitting: General Practice

## 2015-07-23 VITALS — BP 142/83 | HR 84 | Temp 98.4°F | Ht 63.0 in | Wt 172.0 lb

## 2015-07-23 DIAGNOSIS — F419 Anxiety disorder, unspecified: Secondary | ICD-10-CM | POA: Insufficient documentation

## 2015-07-23 DIAGNOSIS — E119 Type 2 diabetes mellitus without complications: Secondary | ICD-10-CM | POA: Diagnosis not present

## 2015-07-23 DIAGNOSIS — K219 Gastro-esophageal reflux disease without esophagitis: Secondary | ICD-10-CM

## 2015-07-23 DIAGNOSIS — K625 Hemorrhage of anus and rectum: Secondary | ICD-10-CM | POA: Diagnosis not present

## 2015-07-23 DIAGNOSIS — S42301A Unspecified fracture of shaft of humerus, right arm, initial encounter for closed fracture: Secondary | ICD-10-CM

## 2015-07-23 DIAGNOSIS — Z79899 Other long term (current) drug therapy: Secondary | ICD-10-CM | POA: Insufficient documentation

## 2015-07-23 DIAGNOSIS — Y9389 Activity, other specified: Secondary | ICD-10-CM | POA: Diagnosis not present

## 2015-07-23 DIAGNOSIS — M25519 Pain in unspecified shoulder: Secondary | ICD-10-CM | POA: Insufficient documentation

## 2015-07-23 DIAGNOSIS — M25511 Pain in right shoulder: Secondary | ICD-10-CM

## 2015-07-23 DIAGNOSIS — I10 Essential (primary) hypertension: Secondary | ICD-10-CM | POA: Diagnosis not present

## 2015-07-23 DIAGNOSIS — E785 Hyperlipidemia, unspecified: Secondary | ICD-10-CM | POA: Insufficient documentation

## 2015-07-23 DIAGNOSIS — Z7901 Long term (current) use of anticoagulants: Secondary | ICD-10-CM | POA: Insufficient documentation

## 2015-07-23 DIAGNOSIS — F209 Schizophrenia, unspecified: Secondary | ICD-10-CM | POA: Diagnosis not present

## 2015-07-23 DIAGNOSIS — G40909 Epilepsy, unspecified, not intractable, without status epilepticus: Secondary | ICD-10-CM | POA: Diagnosis not present

## 2015-07-23 DIAGNOSIS — M25521 Pain in right elbow: Secondary | ICD-10-CM | POA: Diagnosis not present

## 2015-07-23 DIAGNOSIS — Y998 Other external cause status: Secondary | ICD-10-CM | POA: Insufficient documentation

## 2015-07-23 DIAGNOSIS — Y9289 Other specified places as the place of occurrence of the external cause: Secondary | ICD-10-CM | POA: Diagnosis not present

## 2015-07-23 DIAGNOSIS — Z794 Long term (current) use of insulin: Secondary | ICD-10-CM | POA: Diagnosis not present

## 2015-07-23 DIAGNOSIS — S42351A Displaced comminuted fracture of shaft of humerus, right arm, initial encounter for closed fracture: Secondary | ICD-10-CM | POA: Diagnosis not present

## 2015-07-23 DIAGNOSIS — S59911A Unspecified injury of right forearm, initial encounter: Secondary | ICD-10-CM | POA: Diagnosis present

## 2015-07-23 DIAGNOSIS — S42251A Displaced fracture of greater tuberosity of right humerus, initial encounter for closed fracture: Secondary | ICD-10-CM | POA: Diagnosis not present

## 2015-07-23 MED ORDER — HYDROCODONE-ACETAMINOPHEN 5-325 MG PO TABS
2.0000 | ORAL_TABLET | Freq: Once | ORAL | Status: AC
  Administered 2015-07-23: 2 via ORAL
  Filled 2015-07-23: qty 2

## 2015-07-23 MED ORDER — HYDROCODONE-ACETAMINOPHEN 5-325 MG PO TABS: 1.0000 | ORAL_TABLET | ORAL | Status: DC | PRN

## 2015-07-23 NOTE — Progress Notes (Signed)
ON RECALL  °

## 2015-07-23 NOTE — Progress Notes (Signed)
CC'D TO PCP °

## 2015-07-23 NOTE — Progress Notes (Signed)
Subjective:    Patient ID: Natasha Martin, female    DOB: 1960-11-08, 54 y.o.   MRN: [id number]  [facility]   HPI FELL IN PARKING LOT GETTING OUT OF THE VAN. NOW C/O R SHOULD PAIN AND CAN'T MOVE RIGHT ARM. SCAPES ON FINGERS. NO HEARTBURN ON OMEPRAZOLE. BMs: EVERY DAY. OCCASIONAL SPITS UP BLOOD.   PT DENIES FEVER, CHILLS, HEMATOCHEZIA, nausea, vomiting, melena, diarrhea, CHEST PAIN, SHORTNESS OF BREATH, CHANGE IN BOWEL IN HABITS, constipation, abdominal pain, problems swallowing, OR heartburn or indigestion.   Past Medical History  Diagnosis Date  . Diabetes   . Hyperlipidemia   . Schizophrenia   . Anxiety   . Hypertension   . Seizures     has not had a seizure since last year. Unknown orgin- no family available to come and patient does not know.  Marland Kitchen Dysphagia   . Mild mental slowing   . Poor historian     Past Surgical History  Procedure Laterality Date  . 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 Colonoscopy with propofol N/A 01/28/2014    WUJ:WJXBJY mucosa in the terminal ileum/small internal and external hemorrhoids  . Esophagogastroduodenoscopy (egd) with propofol N/A 01/28/2014    NWG:NFAOZH esophagitis/stricture at the gastro junction/moderate erosive  . Savory dilation N/A 01/28/2014    Procedure: SAVORY DILATION  (12.54mm to 16mm);  Surgeon: West Bali, MD;  Location: [facility];  Service: Endoscopy;  Laterality: N/A;  . Polypectomy  01/28/2014    Procedure: POLYPECTOMY (Rectal);  Surgeon: West Bali, MD;  Location: [facility];  Service: Endoscopy;;  . Esophageal biopsy  01/28/2014    Procedure: BIOPSY (Gastric);  Surgeon: West Bali, MD;  Location: [facility];  Service: Endoscopy;;    Allergies  Allergen Reactions  . Sulfa Antibiotics Rash    Current Outpatient Prescriptions  Medication Sig Dispense Refill  . acetaminophen (TYLENOL) 325 MG tablet Take 650 mg by mouth every 6 (six) hours as needed for  moderate pain.    Marland Kitchen amiodarone (PACERONE) 200 MG tablet Take 1 tablet (200 mg total) by mouth 2 (two) times daily.    Marland Kitchen aspirin 81 MG tablet Take 81 mg by mouth daily.    Marland Kitchen atorvastatin (LIPITOR) 40 MG tablet Take 40 mg by mouth daily.    . benztropine (COGENTIN) 2 MG tablet Take 2 mg by mouth 2 (two) times daily.     . calcium-vitamin D (OSCAL WITH D) 500-200 MG-UNIT per tablet Take 1 tablet by mouth daily.    . carbamazepine (TEGRETOL) 200 MG tablet Take 400 mg by mouth 2 (two) times daily.     . clotrimazole-betamethasone (LOTRISONE) cream Apply 1 application topically daily as needed (dry skin and rashes).     . CRESTOR 20 MG tablet Take 20 mg by mouth daily.     . diazepam (VALIUM) 10 MG tablet Take 1 tablet (10 mg total) by mouth daily.    . fenofibrate (TRICOR) 145 MG tablet Take 145 mg by mouth daily.     Marland Kitchen glimepiride (AMARYL) 4 MG tablet Take 4 mg by mouth 2 (two) times daily.     Marland Kitchen glyBURIDE (DIABETA) 5 MG tablet Take 2.5 mg by mouth 2 (two) times daily with a meal.     . haloperidol (HALDOL) 10 MG tablet Take 1 tablet (10 mg total) by mouth 2 (two) times daily.    . insulin  aspart (NOVOLOG) 100 UNIT/ML injection Inject 8 Units into the skin 4 (four) times daily. Give 8 units QID as long as Blood Sugar is above 150.    Marland Kitchen insulin glargine (LANTUS) 100 UNIT/ML injection Inject 20 Units into the skin at bedtime.    Marland Kitchen LORazepam (ATIVAN) 1 MG tablet Take 1 tablet (1 mg total) by mouth 2 (two) times daily as needed for anxiety (anxiety).    . metFORMIN (GLUCOPHAGE) 1000 MG tablet Take 1,000 mg by mouth 2 (two) times daily with a meal.    . metoprolol succinate (TOPROL-XL) 100 MG 24 hr tablet Take 1 tablet (100 mg total) by mouth 2 (two) times daily before a meal. Take with or immediately following a meal.    . Multiple Vitamins-Minerals (MULTIVITAMINS THER. W/MINERALS) TABS tablet Take 1 tablet by mouth daily.    . Omega-3 Fatty Acids (OMEGA-3 FISH OIL PO) Take 1,000 mg by mouth 3 (three)  times daily.    Marland Kitchen omeprazole (PRILOSEC) 20 MG capsule 1 po bid 30 minutes before meals for 3 mos then once daily FOREVER (Patient taking differently: Take 20 mg by mouth daily. )    . ondansetron (ZOFRAN) 4 MG tablet Take 1 tablet (4 mg total) by mouth every 6 (six) hours as needed for nausea.    Marland Kitchen QUEtiapine (SEROQUEL) 200 MG tablet Take 3 tablets (600 mg total) by mouth at bedtime.    . traZODone (DESYREL) 50 MG tablet Take 1 tablet (50 mg total) by mouth at bedtime as needed for sleep.    Marland Kitchen warfarin (COUMADIN) 5 MG tablet Take 1 tablet (5 mg total) by mouth daily.     Review of Systems PT HAS A HISTORY OF FALLING BUT NOT IN A LONG TIME. PER HPI OTHERWISE ALL SYSTEMS ARE NEGATIVE.    Objective:   Physical Exam  Constitutional: She is oriented to person, place, and time. She appears well-developed and well-nourished. No distress.  HENT:  Head: Normocephalic and atraumatic.  Mouth/Throat: Oropharynx is clear and moist. No oropharyngeal exudate.  Eyes: Pupils are equal, round, and reactive to light. No scleral icterus.  Neck: Normal range of motion. Neck supple.  Cardiovascular: Normal rate, regular rhythm and normal heart sounds.   3+ R RADIAL PULSE   Pulmonary/Chest: Effort normal and breath sounds normal. No respiratory distress.  Abdominal: Soft. Bowel sounds are normal. She exhibits no distension. There is no tenderness.  Musculoskeletal: She exhibits no edema.  LIMITED range of motion in R SHOULDER. BAND-AIS ON LEFT HAND/FINGERS, ABLE TOMOVE FINGERS, NO EDEMA IN RUE OR BIL LEs  Lymphadenopathy:    She has no cervical adenopathy.  Neurological: She is alert and oriented to person, place, and time.  NO  NEW FOCAL DEFICITS   Psychiatric:  FLAT AFFECT, NL MOOD  Vitals reviewed.     Assessment & Plan:

## 2015-07-23 NOTE — Assessment & Plan Note (Signed)
SYMPTOMS CONTROLLED/RESOLVED.  CONTINUE TO MONITOR SYMPTOMS. 

## 2015-07-23 NOTE — ED Provider Notes (Signed)
CSN: [id number]     Arrival date & time 07/23/15  1315 History   First MD Initiated Contact with Patient 07/23/15 1341     Chief Complaint  Patient presents with  . Arm Injury      HPI Pt fell this morning when getting out of the car. Pain is in her right shoulder and right elbow. Pt is on coumadin. Pt's aide states she did not hit her head. NAD noted.  Past Medical History  Diagnosis Date  . Diabetes   . Hyperlipidemia   . Schizophrenia   . Anxiety   . Hypertension   . Seizures     has not had a seizure since last year. Unknown orgin- no family available to come and patient does not know.  Marland Kitchen Dysphagia   . Mild mental slowing   . Poor historian    Past Surgical History  Procedure Laterality Date  . 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 Colonoscopy with propofol N/A 01/28/2014    WUJ:WJXBJY mucosa in the terminal ileum/small internal and external hemorrhoids  . Esophagogastroduodenoscopy (egd) with propofol N/A 01/28/2014    NWG:NFAOZH esophagitis/stricture at the gastro junction/moderate erosive  . Savory dilation N/A 01/28/2014    Procedure: SAVORY DILATION  (12.75mm to 16mm);  Surgeon: West Bali, MD;  Location: [facility];  Service: Endoscopy;  Laterality: N/A;  . Polypectomy  01/28/2014    Procedure: POLYPECTOMY (Rectal);  Surgeon: West Bali, MD;  Location: [facility];  Service: Endoscopy;;  . Esophageal biopsy  01/28/2014    Procedure: BIOPSY (Gastric);  Surgeon: West Bali, MD;  Location: [facility];  Service: Endoscopy;;   Family History  Problem Relation Age of Onset  . Colon cancer      unknown   Social History  Substance Use Topics  . Smoking status: Never Smoker   . Smokeless tobacco: Never Used  . Alcohol Use: No     Comment: history of ETOH abuse   OB History    No data available     Review of Systems  All other systems reviewed and are negative.     Allergies  Sulfa antibiotics  Home Medications    Prior to Admission medications   Medication Sig Start Date End Date Taking? Authorizing Dijon Cosens  acetaminophen (TYLENOL) 325 MG tablet Take 650 mg by mouth every 6 (six) hours as needed for moderate pain.   Yes Historical Adyan Palau, MD  amiodarone (PACERONE) 200 MG tablet Take 1 tablet (200 mg total) by mouth 2 (two) times daily. 02/20/15  Yes Christina P Rama, MD  atorvastatin (LIPITOR) 40 MG tablet Take 40 mg by mouth daily.   Yes Historical Alontae Chaloux, MD  benztropine (COGENTIN) 2 MG tablet Take 2 mg by mouth 2 (two) times daily.  01/08/14  Yes Historical Deryl Ports, MD  carbamazepine (TEGRETOL) 200 MG tablet Take 400 mg by mouth 2 (two) times daily.  01/08/14  Yes Historical Makyra Corprew, MD  clotrimazole-betamethasone (LOTRISONE) cream Apply 1 application topically daily as needed (dry skin and rashes).  11/25/13  Yes Historical Kalynn Declercq, MD  CRESTOR 20 MG tablet Take 20 mg by mouth daily.  01/08/14  Yes Historical Jontae Adebayo, MD  diazepam (VALIUM) 10 MG tablet Take 1 tablet (10 mg total) by mouth daily. 02/20/15  Yes Christina P Rama, MD  fenofibrate (TRICOR) 145 MG tablet Take 145 mg by mouth daily.  01/08/14  Yes Historical Felicie Kocher, MD  glimepiride (AMARYL)  4 MG tablet Take 4 mg by mouth 2 (two) times daily.  01/08/14  Yes Historical Alisi Lupien, MD  glyBURIDE (DIABETA) 5 MG tablet Take 2.5 mg by mouth 2 (two) times daily with a meal.  01/08/14  Yes Historical Lynk Marti, MD  haloperidol (HALDOL) 10 MG tablet Take 1 tablet (10 mg total) by mouth 2 (two) times daily. 02/20/15  Yes Christina P Rama, MD  insulin aspart (NOVOLOG) 100 UNIT/ML injection Inject 8 Units into the skin 4 (four) times daily. Give 8 units QID as long as Blood Sugar is above 150.   Yes Historical Nicoletta Hush, MD  insulin glargine (LANTUS) 100 UNIT/ML injection Inject 20 Units into the skin at bedtime.   Yes Historical Asalee Barrette, MD  LORazepam (ATIVAN) 1 MG tablet Take 1 tablet (1 mg total) by mouth 2 (two) times daily as needed for anxiety  (anxiety). 02/20/15  Yes Maryruth Bun Rama, MD  metFORMIN (GLUCOPHAGE) 1000 MG tablet Take 1,000 mg by mouth 2 (two) times daily with a meal.   Yes Historical Bach Rocchi, MD  metoprolol succinate (TOPROL-XL) 100 MG 24 hr tablet Take 1 tablet (100 mg total) by mouth 2 (two) times daily before a meal. Take with or immediately following a meal. 02/20/15  Yes Maryruth Bun Rama, MD  Omega-3 Fatty Acids (OMEGA-3 FISH OIL PO) Take 1,000 mg by mouth 3 (three) times daily.   Yes Historical Jalene Demo, MD  omeprazole (PRILOSEC) 20 MG capsule 1 po bid 30 minutes before meals for 3 mos then once daily FOREVER Patient taking differently: Take 20 mg by mouth daily.  01/28/14  Yes West Bali, MD  ondansetron (ZOFRAN) 4 MG tablet Take 1 tablet (4 mg total) by mouth every 6 (six) hours as needed for nausea. 02/20/15  Yes Christina P Rama, MD  QUEtiapine (SEROQUEL) 200 MG tablet Take 3 tablets (600 mg total) by mouth at bedtime. 02/20/15  Yes Maryruth Bun Rama, MD  traZODone (DESYREL) 50 MG tablet Take 1 tablet (50 mg total) by mouth at bedtime as needed for sleep. 02/20/15  Yes Maryruth Bun Rama, MD  warfarin (COUMADIN) 5 MG tablet Take 1 tablet (5 mg total) by mouth daily. Patient taking differently: Take 2.5 mg by mouth daily. On sat,sun,take 5 mg on Mon,tues, wed,thurs,fri 02/20/15  Yes Christina P Rama, MD  HYDROcodone-acetaminophen (NORCO/VICODIN) 5-325 MG per tablet Take 1-2 tablets by mouth every 4 (four) hours as needed. 07/23/15   Nelva Nay, MD  JANTOVEN 2.5 MG tablet  07/17/15   Historical Amberley Hamler, MD  sucralfate (CARAFATE) 1 G tablet  07/02/15   Historical Desiree Fleming, MD   BP 132/80 mmHg  Pulse 102  Temp(Src) 98.6 F (37 C) (Oral)  Resp 16  SpO2 100%  LMP 01/21/2014 Physical Exam Physical Exam  Nursing note and vitals reviewed. Constitutional: She is oriented to person, place, and time. She appears well-developed and well-nourished. No distress.  HENT:  Head: Normocephalic and atraumatic.  Eyes: Pupils are  equal, round, and reactive to light.  Neck: Normal range of motion.  Cardiovascular: Normal rate and intact distal pulses.   Pulmonary/Chest: No respiratory distress.  Abdominal: Normal appearance. She exhibits no distension.  Musculoskeletal: Patient has exquisite pain to her right shoulder area.  She has good pulses distally.  No evidence of wrist drop. Neurological: She is alert and oriented to person,. No cranial nerve deficit.  Skin: Skin is warm and dry. No rash noted.    ED Course  Procedures (including critical care time) Labs Review Labs Reviewed -  No data to display  Imaging Review Dg Shoulder Right  07/23/2015   CLINICAL DATA:  Right shoulder pain and bruising after falling getting off a [employer] today at Dr. Evelina Dun office.  EXAM: RIGHT SHOULDER - 2+ VIEW  COMPARISON:  None.  FINDINGS: Comminuted fracture of the right humeral neck and greater tuberosity with the inferior displacement and rotation of the humeral head fragment. There is also 1/2 to 1/3 shaft width of anterior displacement of the distal fragment. Posterior angulation of the distal fragment.  IMPRESSION: Comminuted and displaced right humeral neck and greater tuberosity fracture   Electronically Signed   By: Beckie Salts M.D.   On: 07/23/2015 13:52   Dg Elbow Complete Right  07/23/2015   CLINICAL DATA:  She fell getting off the [employer] N[employer] at Dr. Darrick Penna office today. Pain from shoulder down to elbow.  EXAM: RIGHT ELBOW - COMPLETE 3+ VIEW  COMPARISON:  None.  FINDINGS: No fracture. Elbow joint is normally spaced and aligned with no significant arthropathic change. There is no joint effusion. Normal soft tissues.  IMPRESSION: Negative.   Electronically Signed   By: Amie Portland M.D.   On: 07/23/2015 13:51   I have personally reviewed and evaluated these images and lab results as part of my medical decision-making.  I discussed with orthopedics, Romeo Apple MD, who reviewed the film.  Patient to be placed in shoulder immobilizer and  will follow-up in the office.  Given Norco for pain.  MDM   Final diagnoses:  Humerus fracture, right, closed, initial encounter        Nelva Nay, MD 07/23/15 1426

## 2015-07-23 NOTE — Telephone Encounter (Signed)
I called the administrator, Glee Arvin [phone]) from [facility] to make her aware of Ms. Azevedo's fall out of their [employer] N[employer] in the parking lot.

## 2015-07-23 NOTE — Patient Instructions (Signed)
GO TO THE ED FOR AN EVALUATION FOR RIGHT ARM, AND LEFT LEG.  CONTINUE OMEPRAZOLE.  FOLLOW UP IN 6 MOS.

## 2015-07-23 NOTE — ED Notes (Signed)
Pt moved from fast track. Pt fell on rt arm/shoulder today. Pt also of warfarin. Waiting for x-ray results. Pt reports she is unable to move at up. Cap refill brisk.

## 2015-07-23 NOTE — Assessment & Plan Note (Signed)
ACUTE ONSET OF PAIN AFTER FALLING IN PARKING LOT.  MAKE SHIFT SLING PLACED.  GO TO ED FOR AN EVALUATION.

## 2015-07-23 NOTE — Discharge Instructions (Signed)
Humerus Fracture, Treated with Immobilization  The humerus is the large bone in your upper arm. You have a broken (fractured) humerus. These fractures are easily diagnosed with X-rays.  TREATMENT   Simple fractures which will heal without disability are treated with simple immobilization. Immobilization means you will wear a cast, splint, or sling. You have a fracture which will do well with immobilization. The fracture will heal well simply by being held in a good position until it is stable enough to begin range of motion exercises. Do not take part in activities which would further injure your arm.   HOME CARE INSTRUCTIONS    Put ice on the injured area.   Put ice in a plastic bag.   Place a towel between your skin and the bag.   Leave the ice on for 15-20 minutes, 03-04 times a day.   If you have a cast:   Do not scratch the skin under the cast using sharp or pointed objects.   Check the skin around the cast every day. You may put lotion on any red or sore areas.   Keep your cast dry and clean.   If you have a splint:   Wear the splint as directed.   Keep your splint dry and clean.   You may loosen the elastic around the splint if your fingers become numb, tingle, or turn cold or blue.   If you have a sling:   Wear the sling as directed.   Do not put pressure on any part of your cast or splint until it is fully hardened.   Your cast or splint can be protected during bathing with a plastic bag. Do not lower the cast or splint into water.   Only take over-the-counter or prescription medicines for pain, discomfort, or fever as directed by your caregiver.   Do range of motion exercises as instructed by your caregiver.   Follow up as directed by your caregiver. This is very important in order to avoid permanent injury or disability and chronic pain.  SEEK IMMEDIATE MEDICAL CARE IF:    Your skin or nails in the injured arm turn blue or gray.   Your arm feels cold or numb.   You develop severe  pain in the injured arm.   You are having problems with the medicines you were given.  MAKE SURE YOU:    Understand these instructions.   Will watch your condition.   Will get help right away if you are not doing well or get worse.  Document Released: 01/23/2001 Document Revised: 01/09/2012 Document Reviewed: 12/01/2010  ExitCare Patient Information 2015 [employer]. This information is not intended to replace advice given to you by your health care provider. Make sure you discuss any questions you have with your health care provider.

## 2015-07-23 NOTE — ED Notes (Signed)
Pt fell this morning when getting out of the car. Pain is in her right shoulder and right elbow. Pt is on coumadin. Pt's aide states she did not hit her head. NAD noted.

## 2015-07-23 NOTE — Telephone Encounter (Signed)
Pt fractured her RIGHT HUMERUS.

## 2015-07-23 NOTE — Assessment & Plan Note (Addendum)
SYMPTOMS FAIRLY WELL CONTROLLED. GAINED 11 LBS SINCE MAY 2016.  CONTINUE YOUR WEIGHT LOSS EFFORTS. LOW FAT DIET CONTINUE OMEPRAZOLE.  TAKE 30 MINUTES PRIOR TO YOUR FIRST MEAL. FOLLOW UP IN 4 MOS.

## 2015-07-23 NOTE — Telephone Encounter (Signed)
Reviewed

## 2015-07-28 ENCOUNTER — Ambulatory Visit: Payer: Medicare Other | Admitting: Orthopedic Surgery

## 2015-07-28 ENCOUNTER — Encounter: Payer: Self-pay | Admitting: Orthopedic Surgery

## 2015-07-29 DIAGNOSIS — Z7901 Long term (current) use of anticoagulants: Secondary | ICD-10-CM | POA: Diagnosis not present

## 2015-07-30 ENCOUNTER — Ambulatory Visit (INDEPENDENT_AMBULATORY_CARE_PROVIDER_SITE_OTHER): Payer: Medicare Other | Admitting: Orthopedic Surgery

## 2015-07-30 ENCOUNTER — Ambulatory Visit: Payer: Medicare Other | Admitting: Orthopedic Surgery

## 2015-07-30 VITALS — BP 101/77 | Ht 63.0 in | Wt 173.0 lb

## 2015-07-30 DIAGNOSIS — S42201A Unspecified fracture of upper end of right humerus, initial encounter for closed fracture: Secondary | ICD-10-CM | POA: Diagnosis not present

## 2015-07-30 MED ORDER — HYDROCODONE-ACETAMINOPHEN 5-325 MG PO TABS: 1.0000 | ORAL_TABLET | ORAL | Status: DC | PRN

## 2015-07-30 NOTE — Progress Notes (Signed)
Patient ID: Natasha Martin, female   DOB: 06-17-1961, 54 y.o.   MRN: [id number]  New patient   Chief Complaint  Patient presents with  . Follow-up    ER Follow up right humerus fracture DOI 07/23/15     Natasha Martin is a 54 y.o. female.   HPI This patient is 54 years old she has some type of mental slowing condition and lives at a group home. She was at a local doctor's office and fell out of the [facility]  She complains of right shoulder pain initially severe now mild to moderate in a sling. Dull ache over the right shoulder of 70 days duration Review of Systems Breast bruising skin bruising no other symptoms at this point  Her mother's number is [phone] was the caregiver or at least the power of attorney/guardian  Past Medical History  Diagnosis Date  . Diabetes   . Hyperlipidemia   . Schizophrenia   . Anxiety   . Hypertension   . Seizures     has not had a seizure since last year. Unknown orgin- no family available to come and patient does not know.  Marland Kitchen Dysphagia   . Mild mental slowing   . Poor historian     Past Surgical History  Procedure Laterality Date  . 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 Colonoscopy with propofol N/A 01/28/2014    WGN:FAOZHY mucosa in the terminal ileum/small internal and external hemorrhoids  . Esophagogastroduodenoscopy (egd) with propofol N/A 01/28/2014    QMV:HQIONG esophagitis/stricture at the gastro junction/moderate erosive  . Savory dilation N/A 01/28/2014    Procedure: SAVORY DILATION  (12.75mm to 16mm);  Surgeon: West Bali, MD;  Location: [facility];  Service: Endoscopy;  Laterality: N/A;  . Polypectomy  01/28/2014    Procedure: POLYPECTOMY (Rectal);  Surgeon: West Bali, MD;  Location: [facility];  Service: Endoscopy;;  . Esophageal biopsy  01/28/2014    Procedure: BIOPSY (Gastric);  Surgeon: West Bali, MD;  Location: [facility];  Service: Endoscopy;;    Family History  Problem Relation  Age of Onset  . Colon cancer      unknown    Social History Social History  Substance Use Topics  . Smoking status: Never Smoker   . Smokeless tobacco: Never Used  . Alcohol Use: No     Comment: history of ETOH abuse    Allergies  Allergen Reactions  . Sulfa Antibiotics Rash    Current Outpatient Prescriptions  Medication Sig Dispense Refill  . acetaminophen (TYLENOL) 325 MG tablet Take 650 mg by mouth every 6 (six) hours as needed for moderate pain.    Marland Kitchen amiodarone (PACERONE) 200 MG tablet Take 1 tablet (200 mg total) by mouth 2 (two) times daily.    Marland Kitchen atorvastatin (LIPITOR) 40 MG tablet Take 40 mg by mouth daily.    . benztropine (COGENTIN) 2 MG tablet Take 2 mg by mouth 2 (two) times daily.     . carbamazepine (TEGRETOL) 200 MG tablet Take 400 mg by mouth 2 (two) times daily.     . clotrimazole-betamethasone (LOTRISONE) cream Apply 1 application topically daily as needed (dry skin and rashes).     . CRESTOR 20 MG tablet Take 20 mg by mouth daily.     . diazepam (VALIUM) 10 MG tablet Take 1 tablet (10 mg total) by mouth daily. 30 tablet 0  . fenofibrate (TRICOR) 145  MG tablet Take 145 mg by mouth daily.     Marland Kitchen glimepiride (AMARYL) 4 MG tablet Take 4 mg by mouth 2 (two) times daily.     Marland Kitchen glyBURIDE (DIABETA) 5 MG tablet Take 2.5 mg by mouth 2 (two) times daily with a meal.     . haloperidol (HALDOL) 10 MG tablet Take 1 tablet (10 mg total) by mouth 2 (two) times daily.    Marland Kitchen HYDROcodone-acetaminophen (NORCO/VICODIN) 5-325 MG tablet Take 1 tablet by mouth every 4 (four) hours as needed. 42 tablet 0  . insulin aspart (NOVOLOG) 100 UNIT/ML injection Inject 8 Units into the skin 4 (four) times daily. Give 8 units QID as long as Blood Sugar is above 150.    Marland Kitchen insulin glargine (LANTUS) 100 UNIT/ML injection Inject 20 Units into the skin at bedtime.    Marland Kitchen JANTOVEN 2.5 MG tablet Take 5 mg by mouth daily. On sat and sun 2.5 mg on mon,tues,wed, thurs, fri    . LORazepam (ATIVAN) 1 MG tablet  Take 1 tablet (1 mg total) by mouth 2 (two) times daily as needed for anxiety (anxiety). 30 tablet 0  . metFORMIN (GLUCOPHAGE) 1000 MG tablet Take 1,000 mg by mouth 2 (two) times daily with a meal.    . metoprolol succinate (TOPROL-XL) 100 MG 24 hr tablet Take 1 tablet (100 mg total) by mouth 2 (two) times daily before a meal. Take with or immediately following a meal.    . Omega-3 Fatty Acids (OMEGA-3 FISH OIL PO) Take 1,000 mg by mouth 3 (three) times daily.    Marland Kitchen omeprazole (PRILOSEC) 20 MG capsule 1 po bid 30 minutes before meals for 3 mos then once daily FOREVER (Patient taking differently: Take 20 mg by mouth daily. ) 60 capsule 11  . ondansetron (ZOFRAN) 4 MG tablet Take 1 tablet (4 mg total) by mouth every 6 (six) hours as needed for nausea. 20 tablet 0  . QUEtiapine (SEROQUEL) 200 MG tablet Take 3 tablets (600 mg total) by mouth at bedtime.    . sucralfate (CARAFATE) 1 G tablet Take 1 g by mouth 2 (two) times daily.     . traZODone (DESYREL) 50 MG tablet Take 1 tablet (50 mg total) by mouth at bedtime as needed for sleep.    Marland Kitchen warfarin (COUMADIN) 5 MG tablet Take 1 tablet (5 mg total) by mouth daily. (Patient taking differently: Take 2.5 mg by mouth daily. On sat,sun,take 5 mg on Mon,tues, wed,thurs,fri)     No current facility-administered medications for this visit.       Physical Exam Blood pressure 101/77, height  (1.6 m), weight 173 lb (78.472 kg), last menstrual period 01/21/2014. Physical Exam The patient is well developed well nourished and well groomed. Orientation to person place and time is normal  Mood is pleasant. Ambulatory status is normal without a limp Skin remains intact without laceration ulceration  Gross motor exam is intact without atrophy. Muscle tone normal grade 5 motor strength Neurovascular exam remains intact Tenderness in the right proximal humerus decreased range of motion joint stability was not tested she can make a full fist with her hand  although it is swollen she has some bruising on the left hand as well    Data Reviewed  I interpreted the images as  She has a proximal humerus fracture is questionable whether it should be operated but my opinion is that in this setting with her mental status and condition I would not recommend surgery I  will discuss it with her guardian if she wants surgery done we can send her to a shoulder specialist who can support her at a facility with psychiatric support  Assessment   Proximal humerus fracture Plan   Sling x-ray in 2 weeks discuss treatment with parent

## 2015-07-30 NOTE — Patient Instructions (Signed)
Wear arm sling

## 2015-08-06 ENCOUNTER — Ambulatory Visit: Payer: Medicare Other | Admitting: Orthopedic Surgery

## 2015-08-12 DIAGNOSIS — Z7901 Long term (current) use of anticoagulants: Secondary | ICD-10-CM | POA: Diagnosis not present

## 2015-08-13 ENCOUNTER — Ambulatory Visit (INDEPENDENT_AMBULATORY_CARE_PROVIDER_SITE_OTHER): Payer: Self-pay | Admitting: Orthopedic Surgery

## 2015-08-13 ENCOUNTER — Ambulatory Visit (INDEPENDENT_AMBULATORY_CARE_PROVIDER_SITE_OTHER): Payer: Medicare Other

## 2015-08-13 VITALS — BP 143/73 | Ht 63.0 in | Wt 173.0 lb

## 2015-08-13 DIAGNOSIS — S42201P Unspecified fracture of upper end of right humerus, subsequent encounter for fracture with malunion: Secondary | ICD-10-CM

## 2015-08-13 DIAGNOSIS — S42201D Unspecified fracture of upper end of right humerus, subsequent encounter for fracture with routine healing: Secondary | ICD-10-CM

## 2015-08-13 MED ORDER — ACETAMINOPHEN-CODEINE #3 300-30 MG PO TABS: 1.0000 | ORAL_TABLET | ORAL | Status: DC | PRN

## 2015-08-13 NOTE — Patient Instructions (Signed)
We will refer to shoulder specialist, Dr Ave Filterhandler

## 2015-08-13 NOTE — Progress Notes (Signed)
Patient ID: Natasha Martin, female   DOB: [birth year], [age] y.o.   MRN: [id number]  Follow up visit  Chief Complaint  Patient presents with  . Follow-up    2 week follow up + xray right shoulder fracture, DOI 07/23/15    BP 143/73 mmHg  Ht 5\' 3"  (1.6 m)  Wt 173 lb (78.472 kg)  BMI 30.65 kg/m2  LMP 01/21/2014  Encounter Diagnosis  Name Primary?  . Fracture of humerus, proximal, right, closed, with routine healing, subsequent encounter Yes   Clinically she is neurovascularly intact she has proximal humeral tenderness and he cannot tell the atonia of the deltoid and pseudosubluxation clinically.  X-rays today show that the fracture appears to have displaced more than on prior films and the recommendation is for referral to a shoulder specialist for management. The patient is to remain in the sling until the appointment can be arranged  Meds ordered this encounter  Medications  . acetaminophen-codeine (TYLENOL #3) 300-30 MG tablet    Sig: Take 1 tablet by mouth every 4 (four) hours as needed for moderate pain.    Dispense:  84 tablet    Refill:  0

## 2015-08-14 ENCOUNTER — Telehealth: Payer: Self-pay | Admitting: *Deleted

## 2015-08-14 NOTE — Telephone Encounter (Signed)
REFERRAL FAXED TO [facility] FOR SHOULDER SPECIALIST DR Ave FilterHANDLER

## 2015-08-19 DIAGNOSIS — Z794 Long term (current) use of insulin: Secondary | ICD-10-CM | POA: Diagnosis not present

## 2015-08-19 DIAGNOSIS — E119 Type 2 diabetes mellitus without complications: Secondary | ICD-10-CM | POA: Diagnosis not present

## 2015-08-19 DIAGNOSIS — Z7984 Long term (current) use of oral hypoglycemic drugs: Secondary | ICD-10-CM | POA: Diagnosis not present

## 2015-08-26 DIAGNOSIS — Z7901 Long term (current) use of anticoagulants: Secondary | ICD-10-CM | POA: Diagnosis not present

## 2015-09-16 DIAGNOSIS — Z7901 Long term (current) use of anticoagulants: Secondary | ICD-10-CM | POA: Diagnosis not present

## 2015-09-16 DIAGNOSIS — B353 Tinea pedis: Secondary | ICD-10-CM | POA: Diagnosis not present

## 2015-09-16 DIAGNOSIS — B351 Tinea unguium: Secondary | ICD-10-CM | POA: Diagnosis not present

## 2015-09-16 NOTE — Telephone Encounter (Signed)
appt 09/30/15 w/ Dr Ave Filterhandler @ 10am

## 2015-10-01 DIAGNOSIS — E119 Type 2 diabetes mellitus without complications: Secondary | ICD-10-CM | POA: Diagnosis not present

## 2015-10-01 DIAGNOSIS — I10 Essential (primary) hypertension: Secondary | ICD-10-CM | POA: Diagnosis not present

## 2015-10-01 DIAGNOSIS — E0949 Drug or chemical induced diabetes mellitus with neurological complications with other diabetic neurological complication: Secondary | ICD-10-CM | POA: Diagnosis not present

## 2015-10-01 DIAGNOSIS — I82432 Acute embolism and thrombosis of left popliteal vein: Secondary | ICD-10-CM | POA: Diagnosis not present

## 2015-10-01 DIAGNOSIS — Z131 Encounter for screening for diabetes mellitus: Secondary | ICD-10-CM | POA: Diagnosis not present

## 2015-10-14 DIAGNOSIS — M25511 Pain in right shoulder: Secondary | ICD-10-CM | POA: Diagnosis not present

## 2015-10-28 DIAGNOSIS — I82432 Acute embolism and thrombosis of left popliteal vein: Secondary | ICD-10-CM | POA: Diagnosis not present

## 2015-11-08 IMAGING — DX DG ELBOW COMPLETE 3+V*R*
4 series · 4 of 4 positions shown · non-contrast
Comparison: None.

CLINICAL DATA: She fell getting off the [place] at Dr. [REDACTED]
today. Pain from shoulder down to elbow.

EXAM:
RIGHT ELBOW - COMPLETE 3+ VIEW

[elbow ap]
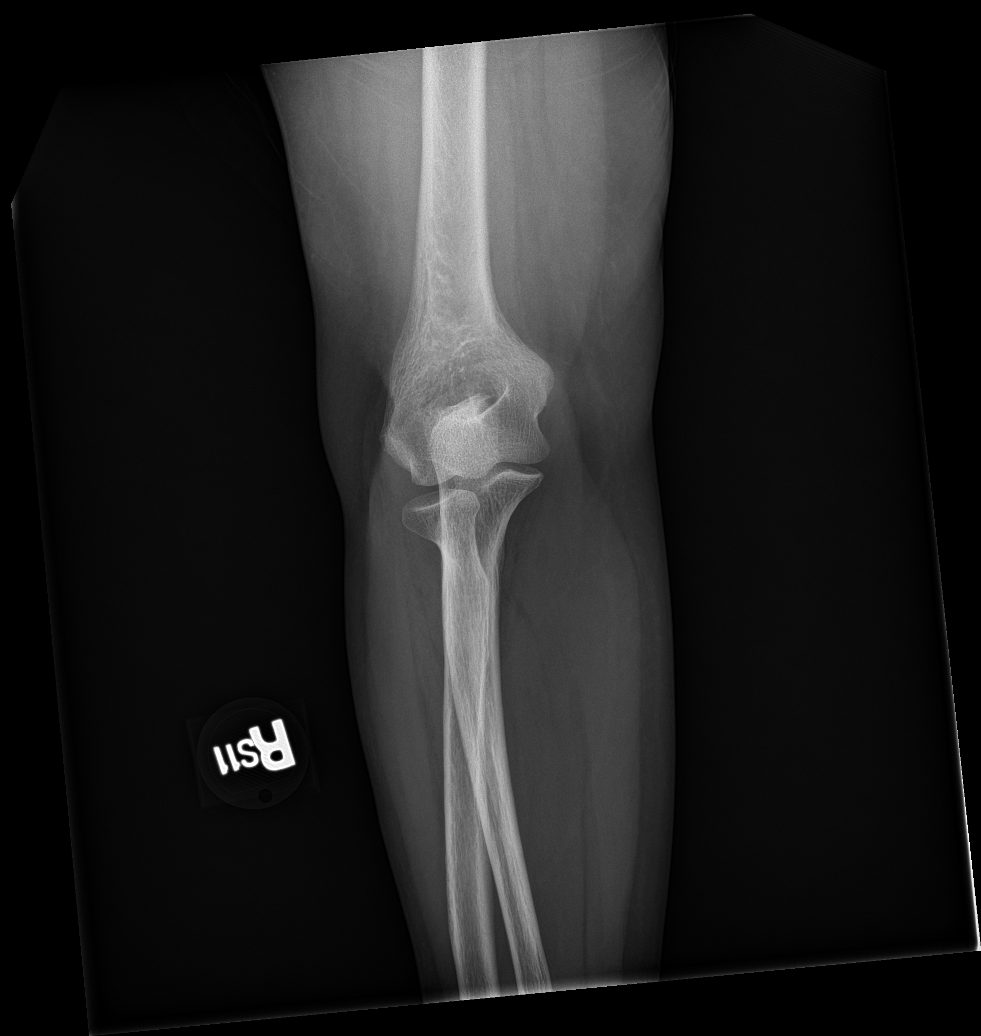

[elbow obl (1 of 2)]
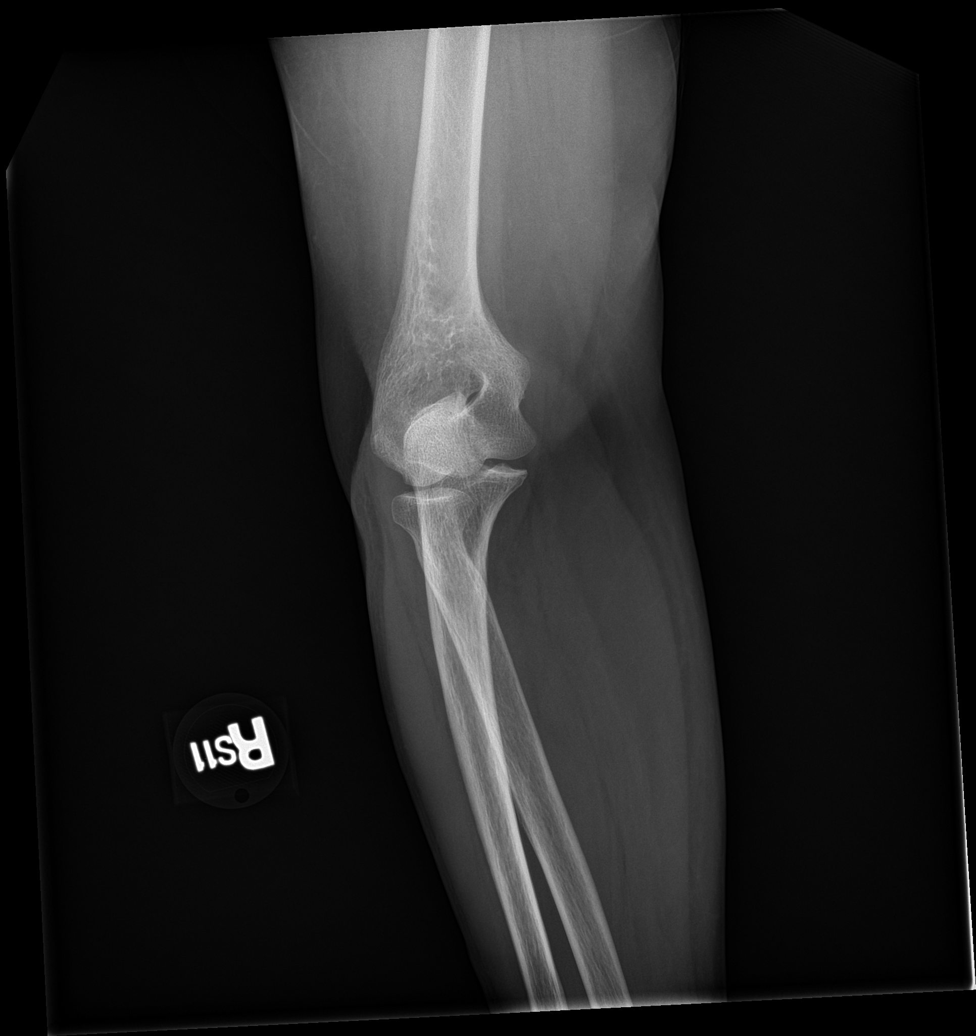

[elbow obl (2 of 2)]
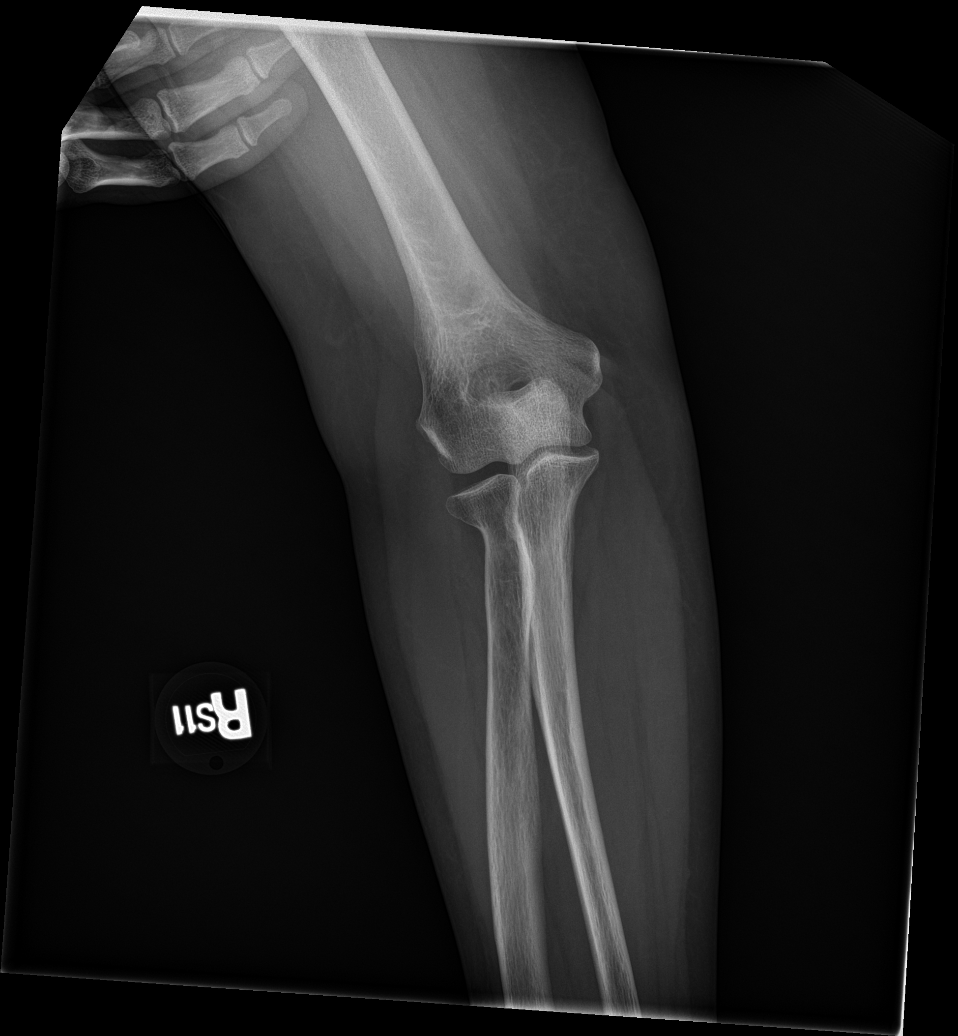

[elbow lat]
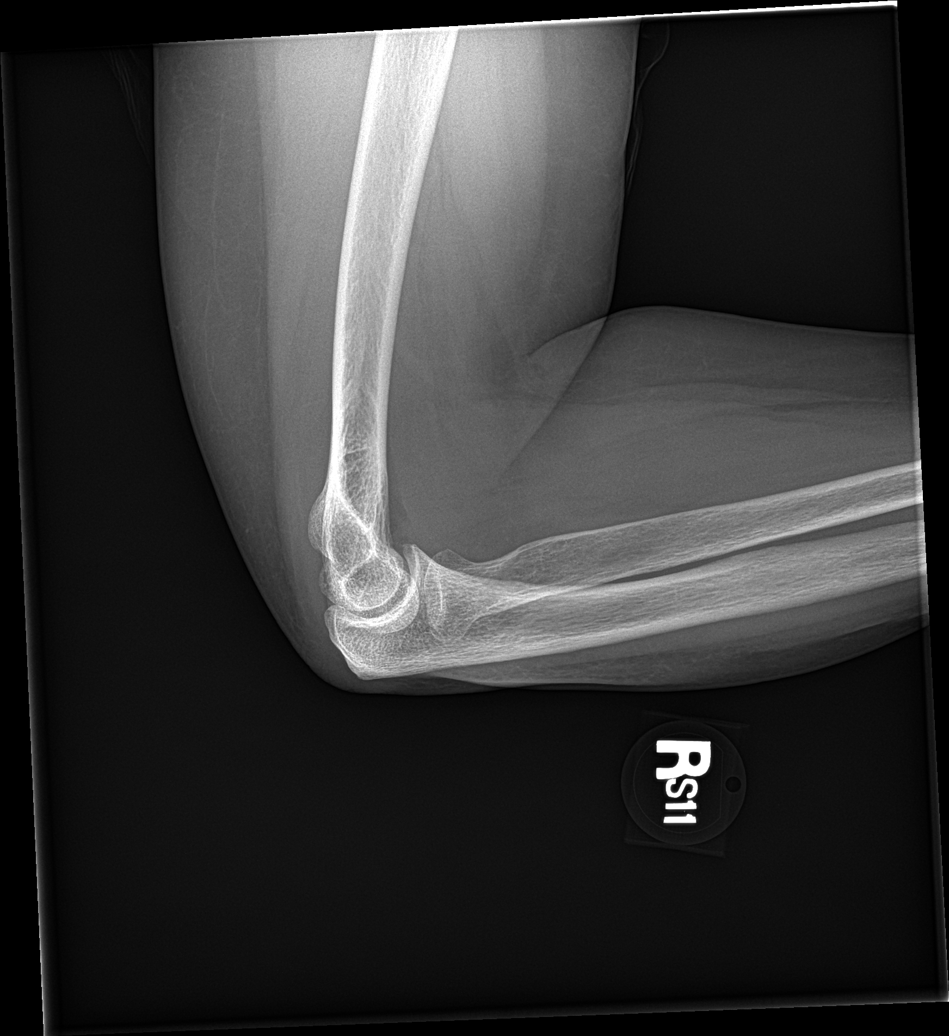

[4 of 4 positions shown; findings below may reference images not displayed]

FINDINGS: No fracture. Elbow joint is normally spaced and aligned with no
significant arthropathic change. There is no joint effusion. Normal
soft tissues.
IMPRESSION: Negative.

## 2015-11-25 DIAGNOSIS — L110 Acquired keratosis follicularis: Secondary | ICD-10-CM | POA: Diagnosis not present

## 2015-11-25 DIAGNOSIS — B351 Tinea unguium: Secondary | ICD-10-CM | POA: Diagnosis not present

## 2015-12-04 DIAGNOSIS — I82432 Acute embolism and thrombosis of left popliteal vein: Secondary | ICD-10-CM | POA: Diagnosis not present

## 2015-12-14 ENCOUNTER — Encounter: Payer: Self-pay | Admitting: Gastroenterology

## 2015-12-21 DIAGNOSIS — R404 Transient alteration of awareness: Secondary | ICD-10-CM | POA: Diagnosis not present

## 2015-12-21 DIAGNOSIS — E785 Hyperlipidemia, unspecified: Secondary | ICD-10-CM | POA: Diagnosis not present

## 2015-12-21 DIAGNOSIS — E860 Dehydration: Secondary | ICD-10-CM | POA: Diagnosis not present

## 2015-12-21 DIAGNOSIS — Z86718 Personal history of other venous thrombosis and embolism: Secondary | ICD-10-CM | POA: Diagnosis not present

## 2015-12-21 DIAGNOSIS — S0990XA Unspecified injury of head, initial encounter: Secondary | ICD-10-CM | POA: Diagnosis not present

## 2015-12-21 DIAGNOSIS — I10 Essential (primary) hypertension: Secondary | ICD-10-CM | POA: Diagnosis not present

## 2015-12-21 DIAGNOSIS — Z23 Encounter for immunization: Secondary | ICD-10-CM | POA: Diagnosis not present

## 2015-12-21 DIAGNOSIS — E784 Other hyperlipidemia: Secondary | ICD-10-CM | POA: Diagnosis not present

## 2015-12-21 DIAGNOSIS — J811 Chronic pulmonary edema: Secondary | ICD-10-CM | POA: Diagnosis not present

## 2015-12-21 DIAGNOSIS — Z7984 Long term (current) use of oral hypoglycemic drugs: Secondary | ICD-10-CM | POA: Diagnosis not present

## 2015-12-21 DIAGNOSIS — E871 Hypo-osmolality and hyponatremia: Secondary | ICD-10-CM | POA: Diagnosis not present

## 2015-12-21 DIAGNOSIS — R2681 Unsteadiness on feet: Secondary | ICD-10-CM | POA: Diagnosis not present

## 2015-12-21 DIAGNOSIS — F3189 Other bipolar disorder: Secondary | ICD-10-CM | POA: Diagnosis not present

## 2015-12-21 DIAGNOSIS — F319 Bipolar disorder, unspecified: Secondary | ICD-10-CM | POA: Diagnosis not present

## 2015-12-21 DIAGNOSIS — E119 Type 2 diabetes mellitus without complications: Secondary | ICD-10-CM | POA: Diagnosis not present

## 2015-12-21 DIAGNOSIS — S199XXA Unspecified injury of neck, initial encounter: Secondary | ICD-10-CM | POA: Diagnosis not present

## 2015-12-21 DIAGNOSIS — R531 Weakness: Secondary | ICD-10-CM | POA: Diagnosis not present

## 2015-12-21 DIAGNOSIS — R42 Dizziness and giddiness: Secondary | ICD-10-CM | POA: Diagnosis not present

## 2015-12-21 DIAGNOSIS — K449 Diaphragmatic hernia without obstruction or gangrene: Secondary | ICD-10-CM | POA: Diagnosis not present

## 2015-12-21 DIAGNOSIS — E118 Type 2 diabetes mellitus with unspecified complications: Secondary | ICD-10-CM | POA: Diagnosis not present

## 2015-12-22 DIAGNOSIS — E784 Other hyperlipidemia: Secondary | ICD-10-CM | POA: Diagnosis not present

## 2015-12-22 DIAGNOSIS — F3189 Other bipolar disorder: Secondary | ICD-10-CM | POA: Diagnosis not present

## 2015-12-22 DIAGNOSIS — R42 Dizziness and giddiness: Secondary | ICD-10-CM | POA: Diagnosis not present

## 2015-12-22 DIAGNOSIS — K449 Diaphragmatic hernia without obstruction or gangrene: Secondary | ICD-10-CM | POA: Diagnosis not present

## 2015-12-22 DIAGNOSIS — E119 Type 2 diabetes mellitus without complications: Secondary | ICD-10-CM | POA: Diagnosis not present

## 2015-12-31 DIAGNOSIS — F3189 Other bipolar disorder: Secondary | ICD-10-CM | POA: Diagnosis not present

## 2015-12-31 DIAGNOSIS — R42 Dizziness and giddiness: Secondary | ICD-10-CM | POA: Diagnosis not present

## 2015-12-31 DIAGNOSIS — M25511 Pain in right shoulder: Secondary | ICD-10-CM | POA: Diagnosis not present

## 2015-12-31 DIAGNOSIS — E784 Other hyperlipidemia: Secondary | ICD-10-CM | POA: Diagnosis not present

## 2015-12-31 DIAGNOSIS — E0949 Drug or chemical induced diabetes mellitus with neurological complications with other diabetic neurological complication: Secondary | ICD-10-CM | POA: Diagnosis not present

## 2015-12-31 DIAGNOSIS — E871 Hypo-osmolality and hyponatremia: Secondary | ICD-10-CM | POA: Diagnosis not present

## 2015-12-31 DIAGNOSIS — I82432 Acute embolism and thrombosis of left popliteal vein: Secondary | ICD-10-CM | POA: Diagnosis not present

## 2015-12-31 DIAGNOSIS — K449 Diaphragmatic hernia without obstruction or gangrene: Secondary | ICD-10-CM | POA: Diagnosis not present

## 2015-12-31 DIAGNOSIS — E119 Type 2 diabetes mellitus without complications: Secondary | ICD-10-CM | POA: Diagnosis not present

## 2015-12-31 DIAGNOSIS — I10 Essential (primary) hypertension: Secondary | ICD-10-CM | POA: Diagnosis not present

## 2016-01-20 DIAGNOSIS — S0990XA Unspecified injury of head, initial encounter: Secondary | ICD-10-CM | POA: Diagnosis not present

## 2016-01-20 DIAGNOSIS — R42 Dizziness and giddiness: Secondary | ICD-10-CM | POA: Diagnosis not present

## 2016-01-20 DIAGNOSIS — E871 Hypo-osmolality and hyponatremia: Secondary | ICD-10-CM | POA: Diagnosis not present

## 2016-01-20 DIAGNOSIS — Z79899 Other long term (current) drug therapy: Secondary | ICD-10-CM | POA: Diagnosis not present

## 2016-01-20 DIAGNOSIS — Z818 Family history of other mental and behavioral disorders: Secondary | ICD-10-CM | POA: Diagnosis not present

## 2016-01-20 DIAGNOSIS — Z794 Long term (current) use of insulin: Secondary | ICD-10-CM | POA: Diagnosis not present

## 2016-01-20 DIAGNOSIS — Z86718 Personal history of other venous thrombosis and embolism: Secondary | ICD-10-CM | POA: Diagnosis not present

## 2016-01-20 DIAGNOSIS — Z8249 Family history of ischemic heart disease and other diseases of the circulatory system: Secondary | ICD-10-CM | POA: Diagnosis not present

## 2016-01-20 DIAGNOSIS — Z885 Allergy status to narcotic agent status: Secondary | ICD-10-CM | POA: Diagnosis not present

## 2016-01-20 DIAGNOSIS — E222 Syndrome of inappropriate secretion of antidiuretic hormone: Secondary | ICD-10-CM | POA: Diagnosis present

## 2016-01-20 DIAGNOSIS — F3189 Other bipolar disorder: Secondary | ICD-10-CM | POA: Diagnosis not present

## 2016-01-20 DIAGNOSIS — K449 Diaphragmatic hernia without obstruction or gangrene: Secondary | ICD-10-CM | POA: Diagnosis present

## 2016-01-20 DIAGNOSIS — R404 Transient alteration of awareness: Secondary | ICD-10-CM | POA: Diagnosis not present

## 2016-01-20 DIAGNOSIS — R569 Unspecified convulsions: Secondary | ICD-10-CM | POA: Diagnosis present

## 2016-01-20 DIAGNOSIS — Z7901 Long term (current) use of anticoagulants: Secondary | ICD-10-CM | POA: Diagnosis not present

## 2016-01-20 DIAGNOSIS — Z7984 Long term (current) use of oral hypoglycemic drugs: Secondary | ICD-10-CM | POA: Diagnosis not present

## 2016-01-20 DIAGNOSIS — R531 Weakness: Secondary | ICD-10-CM | POA: Diagnosis not present

## 2016-01-20 DIAGNOSIS — Z888 Allergy status to other drugs, medicaments and biological substances status: Secondary | ICD-10-CM | POA: Diagnosis not present

## 2016-01-20 DIAGNOSIS — I10 Essential (primary) hypertension: Secondary | ICD-10-CM | POA: Diagnosis present

## 2016-01-20 DIAGNOSIS — E119 Type 2 diabetes mellitus without complications: Secondary | ICD-10-CM | POA: Diagnosis present

## 2016-01-20 DIAGNOSIS — E785 Hyperlipidemia, unspecified: Secondary | ICD-10-CM | POA: Diagnosis not present

## 2016-01-20 DIAGNOSIS — R631 Polydipsia: Secondary | ICD-10-CM | POA: Diagnosis present

## 2016-01-20 DIAGNOSIS — F319 Bipolar disorder, unspecified: Secondary | ICD-10-CM | POA: Diagnosis not present

## 2016-02-01 DIAGNOSIS — E871 Hypo-osmolality and hyponatremia: Secondary | ICD-10-CM | POA: Diagnosis not present

## 2016-02-01 DIAGNOSIS — I10 Essential (primary) hypertension: Secondary | ICD-10-CM | POA: Diagnosis not present

## 2016-02-01 DIAGNOSIS — E1165 Type 2 diabetes mellitus with hyperglycemia: Secondary | ICD-10-CM | POA: Diagnosis not present

## 2016-02-01 DIAGNOSIS — E784 Other hyperlipidemia: Secondary | ICD-10-CM | POA: Diagnosis not present

## 2016-02-01 DIAGNOSIS — I82432 Acute embolism and thrombosis of left popliteal vein: Secondary | ICD-10-CM | POA: Diagnosis not present

## 2016-02-25 DIAGNOSIS — Z7984 Long term (current) use of oral hypoglycemic drugs: Secondary | ICD-10-CM | POA: Diagnosis not present

## 2016-02-25 DIAGNOSIS — Z794 Long term (current) use of insulin: Secondary | ICD-10-CM | POA: Diagnosis not present

## 2016-02-25 DIAGNOSIS — E119 Type 2 diabetes mellitus without complications: Secondary | ICD-10-CM | POA: Diagnosis not present

## 2016-03-16 DIAGNOSIS — Z7901 Long term (current) use of anticoagulants: Secondary | ICD-10-CM | POA: Diagnosis not present

## 2016-03-23 ENCOUNTER — Encounter (INDEPENDENT_AMBULATORY_CARE_PROVIDER_SITE_OTHER): Payer: Medicare Other | Admitting: Gastroenterology

## 2016-03-23 NOTE — Progress Notes (Signed)
   Subjective:    Patient ID: Natasha Martin, female    DOB: 03-Jun-1961, 55 y.o.   MRN: [id number]  Toma DeitersXAJE A HASANAJ, MD  HPI    PT DENIES FEVER, CHILLS, HEMATOCHEZIA, HEMATEMESIS, nausea, vomiting, melena, diarrhea, CHEST PAIN, SHORTNESS OF BREATH,  CHANGE IN BOWEL IN HABITS, constipation, abdominal pain, problems swallowing, problems with sedation, heartburn or indigestion.  Past Medical History  Diagnosis Date  . Diabetes   . Hyperlipidemia   . Schizophrenia   . Anxiety   . Hypertension   . Seizures     has not had a seizure since last year. Unknown orgin- no family available to come and patient does not know.  Marland Kitchen. Dysphagia   . Mild mental slowing   . Poor historian      Past Surgical History  Procedure Laterality Date  . 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 Colonoscopy with propofol N/A 01/28/2014    WUJ:WJXBJYSLF:normal mucosa in the terminal ileum/small internal and external hemorrhoids  . Esophagogastroduodenoscopy (egd) with propofol N/A 01/28/2014    NWG:NFAOZHSLF:reflux esophagitis/stricture at the gastro junction/moderate erosive  . Savory dilation N/A 01/28/2014    Procedure: SAVORY DILATION  (12.308mm to 16mm);  Surgeon: West BaliSandi L Fields, MD;  Location: [facility];  Service: Endoscopy;  Laterality: N/A;  . Polypectomy  01/28/2014    Procedure: POLYPECTOMY (Rectal);  Surgeon: West BaliSandi L Fields, MD;  Location: [facility];  Service: Endoscopy;;  . Biopsy  01/28/2014    Procedure: BIOPSY (Gastric);  Surgeon: West BaliSandi L Fields, MD;  Location: [facility];  Service: Endoscopy;;   Allergies  Allergen Reactions  . Sulfa Antibiotics Rash    Review of Systems     Objective:   Physical Exam        Assessment & Plan:

## 2016-04-14 ENCOUNTER — Ambulatory Visit: Payer: Medicare Other | Admitting: Gastroenterology

## 2016-04-19 ENCOUNTER — Encounter: Payer: Self-pay | Admitting: Gastroenterology

## 2016-04-21 DIAGNOSIS — Z7901 Long term (current) use of anticoagulants: Secondary | ICD-10-CM | POA: Diagnosis not present

## 2016-05-04 DIAGNOSIS — Z888 Allergy status to other drugs, medicaments and biological substances status: Secondary | ICD-10-CM | POA: Diagnosis not present

## 2016-05-04 DIAGNOSIS — I10 Essential (primary) hypertension: Secondary | ICD-10-CM | POA: Diagnosis present

## 2016-05-04 DIAGNOSIS — Z886 Allergy status to analgesic agent status: Secondary | ICD-10-CM | POA: Diagnosis not present

## 2016-05-04 DIAGNOSIS — Z794 Long term (current) use of insulin: Secondary | ICD-10-CM | POA: Diagnosis not present

## 2016-05-04 DIAGNOSIS — E871 Hypo-osmolality and hyponatremia: Secondary | ICD-10-CM | POA: Diagnosis present

## 2016-05-04 DIAGNOSIS — E1169 Type 2 diabetes mellitus with other specified complication: Secondary | ICD-10-CM | POA: Diagnosis not present

## 2016-05-04 DIAGNOSIS — Z7901 Long term (current) use of anticoagulants: Secondary | ICD-10-CM | POA: Diagnosis not present

## 2016-05-04 DIAGNOSIS — F0151 Vascular dementia with behavioral disturbance: Secondary | ICD-10-CM | POA: Diagnosis not present

## 2016-05-04 DIAGNOSIS — I959 Hypotension, unspecified: Secondary | ICD-10-CM | POA: Diagnosis not present

## 2016-05-04 DIAGNOSIS — S3690XA Unspecified injury of unspecified intra-abdominal organ, initial encounter: Secondary | ICD-10-CM | POA: Diagnosis not present

## 2016-05-04 DIAGNOSIS — G4089 Other seizures: Secondary | ICD-10-CM | POA: Diagnosis not present

## 2016-05-04 DIAGNOSIS — R279 Unspecified lack of coordination: Secondary | ICD-10-CM | POA: Diagnosis not present

## 2016-05-04 DIAGNOSIS — Z8249 Family history of ischemic heart disease and other diseases of the circulatory system: Secondary | ICD-10-CM | POA: Diagnosis not present

## 2016-05-04 DIAGNOSIS — M6281 Muscle weakness (generalized): Secondary | ICD-10-CM | POA: Diagnosis not present

## 2016-05-04 DIAGNOSIS — R262 Difficulty in walking, not elsewhere classified: Secondary | ICD-10-CM | POA: Diagnosis not present

## 2016-05-04 DIAGNOSIS — F319 Bipolar disorder, unspecified: Secondary | ICD-10-CM | POA: Diagnosis present

## 2016-05-04 DIAGNOSIS — E785 Hyperlipidemia, unspecified: Secondary | ICD-10-CM | POA: Diagnosis present

## 2016-05-04 DIAGNOSIS — M7989 Other specified soft tissue disorders: Secondary | ICD-10-CM | POA: Diagnosis not present

## 2016-05-04 DIAGNOSIS — S40011A Contusion of right shoulder, initial encounter: Secondary | ICD-10-CM | POA: Diagnosis not present

## 2016-05-04 DIAGNOSIS — M25561 Pain in right knee: Secondary | ICD-10-CM | POA: Diagnosis not present

## 2016-05-04 DIAGNOSIS — F411 Generalized anxiety disorder: Secondary | ICD-10-CM | POA: Diagnosis not present

## 2016-05-04 DIAGNOSIS — R569 Unspecified convulsions: Secondary | ICD-10-CM | POA: Diagnosis present

## 2016-05-04 DIAGNOSIS — R296 Repeated falls: Secondary | ICD-10-CM | POA: Diagnosis not present

## 2016-05-04 DIAGNOSIS — R631 Polydipsia: Secondary | ICD-10-CM | POA: Diagnosis present

## 2016-05-04 DIAGNOSIS — Z86718 Personal history of other venous thrombosis and embolism: Secondary | ICD-10-CM | POA: Diagnosis not present

## 2016-05-04 DIAGNOSIS — S8000XA Contusion of unspecified knee, initial encounter: Secondary | ICD-10-CM | POA: Diagnosis not present

## 2016-05-04 DIAGNOSIS — Z9181 History of falling: Secondary | ICD-10-CM | POA: Diagnosis not present

## 2016-05-04 DIAGNOSIS — R2689 Other abnormalities of gait and mobility: Secondary | ICD-10-CM | POA: Diagnosis not present

## 2016-05-04 DIAGNOSIS — M25511 Pain in right shoulder: Secondary | ICD-10-CM | POA: Diagnosis not present

## 2016-05-04 DIAGNOSIS — N390 Urinary tract infection, site not specified: Secondary | ICD-10-CM | POA: Diagnosis not present

## 2016-05-04 DIAGNOSIS — I4891 Unspecified atrial fibrillation: Secondary | ICD-10-CM | POA: Diagnosis present

## 2016-05-04 DIAGNOSIS — Z818 Family history of other mental and behavioral disorders: Secondary | ICD-10-CM | POA: Diagnosis not present

## 2016-05-04 DIAGNOSIS — Z79899 Other long term (current) drug therapy: Secondary | ICD-10-CM | POA: Diagnosis not present

## 2016-05-04 DIAGNOSIS — K449 Diaphragmatic hernia without obstruction or gangrene: Secondary | ICD-10-CM | POA: Diagnosis present

## 2016-05-04 DIAGNOSIS — S8001XA Contusion of right knee, initial encounter: Secondary | ICD-10-CM | POA: Diagnosis not present

## 2016-05-04 DIAGNOSIS — S8000XD Contusion of unspecified knee, subsequent encounter: Secondary | ICD-10-CM | POA: Diagnosis not present

## 2016-05-04 DIAGNOSIS — S8002XA Contusion of left knee, initial encounter: Secondary | ICD-10-CM | POA: Diagnosis present

## 2016-05-04 DIAGNOSIS — F458 Other somatoform disorders: Secondary | ICD-10-CM | POA: Diagnosis present

## 2016-05-04 DIAGNOSIS — W1839XA Other fall on same level, initial encounter: Secondary | ICD-10-CM | POA: Diagnosis not present

## 2016-05-09 DIAGNOSIS — S8002XA Contusion of left knee, initial encounter: Secondary | ICD-10-CM | POA: Diagnosis not present

## 2016-05-09 DIAGNOSIS — F3189 Other bipolar disorder: Secondary | ICD-10-CM | POA: Diagnosis not present

## 2016-05-09 DIAGNOSIS — R631 Polydipsia: Secondary | ICD-10-CM | POA: Diagnosis not present

## 2016-05-09 DIAGNOSIS — E784 Other hyperlipidemia: Secondary | ICD-10-CM | POA: Diagnosis not present

## 2016-05-09 DIAGNOSIS — S329XXA Fracture of unspecified parts of lumbosacral spine and pelvis, initial encounter for closed fracture: Secondary | ICD-10-CM | POA: Diagnosis not present

## 2016-05-09 DIAGNOSIS — E871 Hypo-osmolality and hyponatremia: Secondary | ICD-10-CM | POA: Diagnosis not present

## 2016-05-09 DIAGNOSIS — S8000XD Contusion of unspecified knee, subsequent encounter: Secondary | ICD-10-CM | POA: Diagnosis not present

## 2016-05-09 DIAGNOSIS — D649 Anemia, unspecified: Secondary | ICD-10-CM | POA: Diagnosis not present

## 2016-05-09 DIAGNOSIS — N390 Urinary tract infection, site not specified: Secondary | ICD-10-CM | POA: Diagnosis not present

## 2016-05-09 DIAGNOSIS — F0280 Dementia in other diseases classified elsewhere without behavioral disturbance: Secondary | ICD-10-CM | POA: Diagnosis not present

## 2016-05-09 DIAGNOSIS — K449 Diaphragmatic hernia without obstruction or gangrene: Secondary | ICD-10-CM | POA: Diagnosis not present

## 2016-05-09 DIAGNOSIS — Z7901 Long term (current) use of anticoagulants: Secondary | ICD-10-CM | POA: Diagnosis not present

## 2016-05-09 DIAGNOSIS — Z23 Encounter for immunization: Secondary | ICD-10-CM | POA: Diagnosis not present

## 2016-05-09 DIAGNOSIS — S8001XA Contusion of right knee, initial encounter: Secondary | ICD-10-CM | POA: Diagnosis not present

## 2016-05-09 DIAGNOSIS — F0151 Vascular dementia with behavioral disturbance: Secondary | ICD-10-CM | POA: Diagnosis not present

## 2016-05-09 DIAGNOSIS — R2689 Other abnormalities of gait and mobility: Secondary | ICD-10-CM | POA: Diagnosis not present

## 2016-05-09 DIAGNOSIS — E785 Hyperlipidemia, unspecified: Secondary | ICD-10-CM | POA: Diagnosis not present

## 2016-05-09 DIAGNOSIS — R279 Unspecified lack of coordination: Secondary | ICD-10-CM | POA: Diagnosis not present

## 2016-05-09 DIAGNOSIS — E1169 Type 2 diabetes mellitus with other specified complication: Secondary | ICD-10-CM | POA: Diagnosis not present

## 2016-05-09 DIAGNOSIS — E118 Type 2 diabetes mellitus with unspecified complications: Secondary | ICD-10-CM | POA: Diagnosis not present

## 2016-05-09 DIAGNOSIS — I10 Essential (primary) hypertension: Secondary | ICD-10-CM | POA: Diagnosis not present

## 2016-05-09 DIAGNOSIS — R296 Repeated falls: Secondary | ICD-10-CM | POA: Diagnosis not present

## 2016-05-09 DIAGNOSIS — E1165 Type 2 diabetes mellitus with hyperglycemia: Secondary | ICD-10-CM | POA: Diagnosis not present

## 2016-05-09 DIAGNOSIS — I481 Persistent atrial fibrillation: Secondary | ICD-10-CM | POA: Diagnosis not present

## 2016-05-09 DIAGNOSIS — S8000XA Contusion of unspecified knee, initial encounter: Secondary | ICD-10-CM | POA: Diagnosis not present

## 2016-05-09 DIAGNOSIS — Z5181 Encounter for therapeutic drug level monitoring: Secondary | ICD-10-CM | POA: Diagnosis not present

## 2016-05-09 DIAGNOSIS — Z86718 Personal history of other venous thrombosis and embolism: Secondary | ICD-10-CM | POA: Diagnosis not present

## 2016-05-09 DIAGNOSIS — F411 Generalized anxiety disorder: Secondary | ICD-10-CM | POA: Diagnosis not present

## 2016-05-09 DIAGNOSIS — F319 Bipolar disorder, unspecified: Secondary | ICD-10-CM | POA: Diagnosis not present

## 2016-05-09 DIAGNOSIS — G4089 Other seizures: Secondary | ICD-10-CM | POA: Diagnosis not present

## 2016-05-09 DIAGNOSIS — E1151 Type 2 diabetes mellitus with diabetic peripheral angiopathy without gangrene: Secondary | ICD-10-CM | POA: Diagnosis not present

## 2016-05-09 DIAGNOSIS — M6281 Muscle weakness (generalized): Secondary | ICD-10-CM | POA: Diagnosis not present

## 2016-05-09 DIAGNOSIS — F458 Other somatoform disorders: Secondary | ICD-10-CM | POA: Diagnosis not present

## 2016-05-10 DIAGNOSIS — Z7901 Long term (current) use of anticoagulants: Secondary | ICD-10-CM | POA: Diagnosis not present

## 2016-05-10 DIAGNOSIS — Z86718 Personal history of other venous thrombosis and embolism: Secondary | ICD-10-CM | POA: Diagnosis not present

## 2016-05-10 DIAGNOSIS — D649 Anemia, unspecified: Secondary | ICD-10-CM | POA: Diagnosis not present

## 2016-05-10 DIAGNOSIS — E118 Type 2 diabetes mellitus with unspecified complications: Secondary | ICD-10-CM | POA: Diagnosis not present

## 2016-06-01 ENCOUNTER — Ambulatory Visit: Payer: Medicare Other | Admitting: Gastroenterology

## 2016-06-08 ENCOUNTER — Ambulatory Visit: Payer: Medicare Other | Admitting: Gastroenterology

## 2016-06-30 DIAGNOSIS — E784 Other hyperlipidemia: Secondary | ICD-10-CM | POA: Diagnosis not present

## 2016-06-30 DIAGNOSIS — F0280 Dementia in other diseases classified elsewhere without behavioral disturbance: Secondary | ICD-10-CM | POA: Diagnosis not present

## 2016-06-30 DIAGNOSIS — F3189 Other bipolar disorder: Secondary | ICD-10-CM | POA: Diagnosis not present

## 2016-07-13 ENCOUNTER — Ambulatory Visit: Payer: Medicare Other | Admitting: Gastroenterology

## 2016-08-09 DIAGNOSIS — E784 Other hyperlipidemia: Secondary | ICD-10-CM | POA: Diagnosis not present

## 2016-08-09 DIAGNOSIS — I481 Persistent atrial fibrillation: Secondary | ICD-10-CM | POA: Diagnosis not present

## 2016-08-09 DIAGNOSIS — I10 Essential (primary) hypertension: Secondary | ICD-10-CM | POA: Diagnosis not present

## 2016-08-09 DIAGNOSIS — E1165 Type 2 diabetes mellitus with hyperglycemia: Secondary | ICD-10-CM | POA: Diagnosis not present

## 2016-08-17 ENCOUNTER — Ambulatory Visit: Payer: Medicare Other | Admitting: Gastroenterology

## 2016-08-17 ENCOUNTER — Telehealth: Payer: Self-pay | Admitting: Gastroenterology

## 2016-08-17 ENCOUNTER — Encounter: Payer: Self-pay | Admitting: Gastroenterology

## 2016-08-17 NOTE — Telephone Encounter (Signed)
PT WAS A NO SHOW AND LETTER SENT  °

## 2016-08-17 NOTE — Progress Notes (Deleted)
   Subjective:    Patient ID: Natasha Martin, female    DOB: May 24, 1961, 55 y.o.   MRN: [id number]  HPI    Review of Systems     Objective:   Physical Exam        Assessment & Plan:

## 2016-08-18 NOTE — Telephone Encounter (Signed)
REVIEWED-NO ADDITIONAL RECOMMENDATIONS. 

## 2016-09-16 DIAGNOSIS — I481 Persistent atrial fibrillation: Secondary | ICD-10-CM | POA: Diagnosis not present

## 2016-10-06 DIAGNOSIS — B351 Tinea unguium: Secondary | ICD-10-CM | POA: Diagnosis not present

## 2016-10-06 DIAGNOSIS — I739 Peripheral vascular disease, unspecified: Secondary | ICD-10-CM | POA: Diagnosis not present

## 2016-10-06 DIAGNOSIS — L110 Acquired keratosis follicularis: Secondary | ICD-10-CM | POA: Diagnosis not present

## 2016-11-11 DIAGNOSIS — I481 Persistent atrial fibrillation: Secondary | ICD-10-CM | POA: Diagnosis not present

## 2016-11-11 DIAGNOSIS — E1165 Type 2 diabetes mellitus with hyperglycemia: Secondary | ICD-10-CM | POA: Diagnosis not present

## 2016-11-11 DIAGNOSIS — I10 Essential (primary) hypertension: Secondary | ICD-10-CM | POA: Diagnosis not present

## 2016-11-11 DIAGNOSIS — K219 Gastro-esophageal reflux disease without esophagitis: Secondary | ICD-10-CM | POA: Diagnosis not present

## 2016-11-11 DIAGNOSIS — Z1389 Encounter for screening for other disorder: Secondary | ICD-10-CM | POA: Diagnosis not present

## 2016-11-11 DIAGNOSIS — Z0000 Encounter for general adult medical examination without abnormal findings: Secondary | ICD-10-CM | POA: Diagnosis not present

## 2016-11-11 DIAGNOSIS — E784 Other hyperlipidemia: Secondary | ICD-10-CM | POA: Diagnosis not present

## 2016-12-12 DIAGNOSIS — Z1231 Encounter for screening mammogram for malignant neoplasm of breast: Secondary | ICD-10-CM | POA: Diagnosis not present

## 2016-12-14 DIAGNOSIS — I481 Persistent atrial fibrillation: Secondary | ICD-10-CM | POA: Diagnosis not present

## 2016-12-21 DIAGNOSIS — H524 Presbyopia: Secondary | ICD-10-CM | POA: Diagnosis not present

## 2016-12-21 DIAGNOSIS — I10 Essential (primary) hypertension: Secondary | ICD-10-CM | POA: Diagnosis not present

## 2016-12-21 DIAGNOSIS — E119 Type 2 diabetes mellitus without complications: Secondary | ICD-10-CM | POA: Diagnosis not present

## 2016-12-21 DIAGNOSIS — Z794 Long term (current) use of insulin: Secondary | ICD-10-CM | POA: Diagnosis not present

## 2016-12-21 DIAGNOSIS — H35031 Hypertensive retinopathy, right eye: Secondary | ICD-10-CM | POA: Diagnosis not present

## 2016-12-21 DIAGNOSIS — H35033 Hypertensive retinopathy, bilateral: Secondary | ICD-10-CM | POA: Diagnosis not present

## 2016-12-21 DIAGNOSIS — Z7984 Long term (current) use of oral hypoglycemic drugs: Secondary | ICD-10-CM | POA: Diagnosis not present

## 2016-12-21 DIAGNOSIS — H1859 Other hereditary corneal dystrophies: Secondary | ICD-10-CM | POA: Diagnosis not present

## 2016-12-21 DIAGNOSIS — H35032 Hypertensive retinopathy, left eye: Secondary | ICD-10-CM | POA: Diagnosis not present

## 2016-12-21 DIAGNOSIS — H5702 Anisocoria: Secondary | ICD-10-CM | POA: Diagnosis not present

## 2017-02-09 DIAGNOSIS — F319 Bipolar disorder, unspecified: Secondary | ICD-10-CM | POA: Diagnosis not present

## 2017-02-09 DIAGNOSIS — I4891 Unspecified atrial fibrillation: Secondary | ICD-10-CM | POA: Diagnosis not present

## 2017-02-09 DIAGNOSIS — Z888 Allergy status to other drugs, medicaments and biological substances status: Secondary | ICD-10-CM | POA: Diagnosis not present

## 2017-02-09 DIAGNOSIS — K219 Gastro-esophageal reflux disease without esophagitis: Secondary | ICD-10-CM | POA: Diagnosis not present

## 2017-02-09 DIAGNOSIS — E119 Type 2 diabetes mellitus without complications: Secondary | ICD-10-CM | POA: Diagnosis not present

## 2017-02-09 DIAGNOSIS — I10 Essential (primary) hypertension: Secondary | ICD-10-CM | POA: Diagnosis not present

## 2017-02-09 DIAGNOSIS — Z886 Allergy status to analgesic agent status: Secondary | ICD-10-CM | POA: Diagnosis not present

## 2017-02-09 DIAGNOSIS — Z1211 Encounter for screening for malignant neoplasm of colon: Secondary | ICD-10-CM | POA: Diagnosis not present

## 2017-02-09 DIAGNOSIS — Z880 Allergy status to penicillin: Secondary | ICD-10-CM | POA: Diagnosis not present

## 2017-03-16 DIAGNOSIS — I10 Essential (primary) hypertension: Secondary | ICD-10-CM | POA: Diagnosis not present

## 2017-03-16 DIAGNOSIS — E784 Other hyperlipidemia: Secondary | ICD-10-CM | POA: Diagnosis not present

## 2017-03-16 DIAGNOSIS — E1165 Type 2 diabetes mellitus with hyperglycemia: Secondary | ICD-10-CM | POA: Diagnosis not present

## 2017-03-16 DIAGNOSIS — I481 Persistent atrial fibrillation: Secondary | ICD-10-CM | POA: Diagnosis not present

## 2017-03-16 DIAGNOSIS — K219 Gastro-esophageal reflux disease without esophagitis: Secondary | ICD-10-CM | POA: Diagnosis not present

## 2017-05-25 DIAGNOSIS — L110 Acquired keratosis follicularis: Secondary | ICD-10-CM | POA: Diagnosis not present

## 2017-05-25 DIAGNOSIS — B351 Tinea unguium: Secondary | ICD-10-CM | POA: Diagnosis not present

## 2017-05-25 DIAGNOSIS — I739 Peripheral vascular disease, unspecified: Secondary | ICD-10-CM | POA: Diagnosis not present

## 2017-06-23 DIAGNOSIS — E1165 Type 2 diabetes mellitus with hyperglycemia: Secondary | ICD-10-CM | POA: Diagnosis not present

## 2017-06-23 DIAGNOSIS — I10 Essential (primary) hypertension: Secondary | ICD-10-CM | POA: Diagnosis not present

## 2017-06-23 DIAGNOSIS — E119 Type 2 diabetes mellitus without complications: Secondary | ICD-10-CM | POA: Diagnosis not present

## 2017-06-23 DIAGNOSIS — K219 Gastro-esophageal reflux disease without esophagitis: Secondary | ICD-10-CM | POA: Diagnosis not present

## 2017-06-23 DIAGNOSIS — F200 Paranoid schizophrenia: Secondary | ICD-10-CM | POA: Diagnosis not present

## 2017-06-23 DIAGNOSIS — I481 Persistent atrial fibrillation: Secondary | ICD-10-CM | POA: Diagnosis not present

## 2017-06-23 DIAGNOSIS — E784 Other hyperlipidemia: Secondary | ICD-10-CM | POA: Diagnosis not present

## 2017-07-17 DIAGNOSIS — I481 Persistent atrial fibrillation: Secondary | ICD-10-CM | POA: Diagnosis not present

## 2017-07-25 DIAGNOSIS — I481 Persistent atrial fibrillation: Secondary | ICD-10-CM | POA: Diagnosis not present

## 2017-08-17 DIAGNOSIS — I739 Peripheral vascular disease, unspecified: Secondary | ICD-10-CM | POA: Diagnosis not present

## 2017-08-17 DIAGNOSIS — B351 Tinea unguium: Secondary | ICD-10-CM | POA: Diagnosis not present

## 2017-08-17 DIAGNOSIS — L110 Acquired keratosis follicularis: Secondary | ICD-10-CM | POA: Diagnosis not present

## 2017-09-20 DIAGNOSIS — I481 Persistent atrial fibrillation: Secondary | ICD-10-CM | POA: Diagnosis not present

## 2017-09-20 DIAGNOSIS — E7849 Other hyperlipidemia: Secondary | ICD-10-CM | POA: Diagnosis not present

## 2017-09-20 DIAGNOSIS — F200 Paranoid schizophrenia: Secondary | ICD-10-CM | POA: Diagnosis not present

## 2017-09-20 DIAGNOSIS — K219 Gastro-esophageal reflux disease without esophagitis: Secondary | ICD-10-CM | POA: Diagnosis not present

## 2017-09-20 DIAGNOSIS — I10 Essential (primary) hypertension: Secondary | ICD-10-CM | POA: Diagnosis not present

## 2017-09-20 DIAGNOSIS — E119 Type 2 diabetes mellitus without complications: Secondary | ICD-10-CM | POA: Diagnosis not present

## 2017-09-20 DIAGNOSIS — Z7901 Long term (current) use of anticoagulants: Secondary | ICD-10-CM | POA: Diagnosis not present

## 2017-10-30 DIAGNOSIS — I481 Persistent atrial fibrillation: Secondary | ICD-10-CM | POA: Diagnosis not present

## 2017-11-15 DIAGNOSIS — L2081 Atopic neurodermatitis: Secondary | ICD-10-CM | POA: Diagnosis not present

## 2017-11-15 DIAGNOSIS — L853 Xerosis cutis: Secondary | ICD-10-CM | POA: Diagnosis not present

## 2017-11-15 DIAGNOSIS — M79671 Pain in right foot: Secondary | ICD-10-CM | POA: Diagnosis not present

## 2017-11-15 DIAGNOSIS — B351 Tinea unguium: Secondary | ICD-10-CM | POA: Diagnosis not present

## 2018-01-03 DIAGNOSIS — K219 Gastro-esophageal reflux disease without esophagitis: Secondary | ICD-10-CM | POA: Diagnosis not present

## 2018-01-03 DIAGNOSIS — I10 Essential (primary) hypertension: Secondary | ICD-10-CM | POA: Diagnosis not present

## 2018-01-03 DIAGNOSIS — F200 Paranoid schizophrenia: Secondary | ICD-10-CM | POA: Diagnosis not present

## 2018-01-03 DIAGNOSIS — E7849 Other hyperlipidemia: Secondary | ICD-10-CM | POA: Diagnosis not present

## 2018-01-03 DIAGNOSIS — E119 Type 2 diabetes mellitus without complications: Secondary | ICD-10-CM | POA: Diagnosis not present

## 2018-01-03 DIAGNOSIS — I481 Persistent atrial fibrillation: Secondary | ICD-10-CM | POA: Diagnosis not present

## 2018-01-24 DIAGNOSIS — M79672 Pain in left foot: Secondary | ICD-10-CM | POA: Diagnosis not present

## 2018-01-24 DIAGNOSIS — S90112A Contusion of left great toe without damage to nail, initial encounter: Secondary | ICD-10-CM | POA: Diagnosis not present

## 2018-01-24 DIAGNOSIS — I739 Peripheral vascular disease, unspecified: Secondary | ICD-10-CM | POA: Diagnosis not present

## 2018-01-24 DIAGNOSIS — L110 Acquired keratosis follicularis: Secondary | ICD-10-CM | POA: Diagnosis not present

## 2018-01-24 DIAGNOSIS — B351 Tinea unguium: Secondary | ICD-10-CM | POA: Diagnosis not present

## 2018-01-27 ENCOUNTER — Encounter (HOSPITAL_COMMUNITY): Payer: Self-pay | Admitting: Emergency Medicine

## 2018-01-27 ENCOUNTER — Emergency Department (HOSPITAL_COMMUNITY): Payer: Medicare Other

## 2018-01-27 ENCOUNTER — Inpatient Hospital Stay (HOSPITAL_COMMUNITY): Payer: Medicare Other

## 2018-01-27 ENCOUNTER — Inpatient Hospital Stay (HOSPITAL_COMMUNITY)
Admission: EM | Admit: 2018-01-27 | Discharge: 2018-02-13 | DRG: 853 | Disposition: A | Discharge status: Skilled Nursing Facility | Payer: Medicare Other | Attending: Internal Medicine | Admitting: Internal Medicine

## 2018-01-27 DIAGNOSIS — A419 Sepsis, unspecified organism: Secondary | ICD-10-CM | POA: Diagnosis present

## 2018-01-27 DIAGNOSIS — R6521 Severe sepsis with septic shock: Secondary | ICD-10-CM | POA: Diagnosis present

## 2018-01-27 DIAGNOSIS — G40909 Epilepsy, unspecified, not intractable, without status epilepticus: Secondary | ICD-10-CM | POA: Diagnosis not present

## 2018-01-27 DIAGNOSIS — M6281 Muscle weakness (generalized): Secondary | ICD-10-CM | POA: Diagnosis not present

## 2018-01-27 DIAGNOSIS — Z6834 Body mass index (BMI) 34.0-34.9, adult: Secondary | ICD-10-CM | POA: Diagnosis not present

## 2018-01-27 DIAGNOSIS — H5702 Anisocoria: Secondary | ICD-10-CM | POA: Diagnosis not present

## 2018-01-27 DIAGNOSIS — I110 Hypertensive heart disease with heart failure: Secondary | ICD-10-CM | POA: Diagnosis present

## 2018-01-27 DIAGNOSIS — R4182 Altered mental status, unspecified: Secondary | ICD-10-CM | POA: Diagnosis not present

## 2018-01-27 DIAGNOSIS — I080 Rheumatic disorders of both mitral and aortic valves: Secondary | ICD-10-CM | POA: Diagnosis not present

## 2018-01-27 DIAGNOSIS — R0682 Tachypnea, not elsewhere classified: Secondary | ICD-10-CM | POA: Diagnosis not present

## 2018-01-27 DIAGNOSIS — R1312 Dysphagia, oropharyngeal phase: Secondary | ICD-10-CM | POA: Diagnosis not present

## 2018-01-27 DIAGNOSIS — I440 Atrioventricular block, first degree: Secondary | ICD-10-CM | POA: Diagnosis present

## 2018-01-27 DIAGNOSIS — I10 Essential (primary) hypertension: Secondary | ICD-10-CM | POA: Diagnosis not present

## 2018-01-27 DIAGNOSIS — I5030 Unspecified diastolic (congestive) heart failure: Secondary | ICD-10-CM | POA: Diagnosis not present

## 2018-01-27 DIAGNOSIS — G9341 Metabolic encephalopathy: Secondary | ICD-10-CM | POA: Diagnosis present

## 2018-01-27 DIAGNOSIS — F209 Schizophrenia, unspecified: Secondary | ICD-10-CM | POA: Diagnosis not present

## 2018-01-27 DIAGNOSIS — I480 Paroxysmal atrial fibrillation: Secondary | ICD-10-CM | POA: Diagnosis not present

## 2018-01-27 DIAGNOSIS — D6489 Other specified anemias: Secondary | ICD-10-CM | POA: Diagnosis present

## 2018-01-27 DIAGNOSIS — R29898 Other symptoms and signs involving the musculoskeletal system: Secondary | ICD-10-CM | POA: Diagnosis not present

## 2018-01-27 DIAGNOSIS — R2689 Other abnormalities of gait and mobility: Secondary | ICD-10-CM | POA: Diagnosis not present

## 2018-01-27 DIAGNOSIS — Z4682 Encounter for fitting and adjustment of non-vascular catheter: Secondary | ICD-10-CM | POA: Diagnosis not present

## 2018-01-27 DIAGNOSIS — R0902 Hypoxemia: Secondary | ICD-10-CM | POA: Diagnosis not present

## 2018-01-27 DIAGNOSIS — Z7901 Long term (current) use of anticoagulants: Secondary | ICD-10-CM

## 2018-01-27 DIAGNOSIS — R509 Fever, unspecified: Secondary | ICD-10-CM | POA: Diagnosis not present

## 2018-01-27 DIAGNOSIS — R931 Abnormal findings on diagnostic imaging of heart and coronary circulation: Secondary | ICD-10-CM | POA: Diagnosis not present

## 2018-01-27 DIAGNOSIS — E876 Hypokalemia: Secondary | ICD-10-CM | POA: Diagnosis not present

## 2018-01-27 DIAGNOSIS — K219 Gastro-esophageal reflux disease without esophagitis: Secondary | ICD-10-CM | POA: Diagnosis present

## 2018-01-27 DIAGNOSIS — I6789 Other cerebrovascular disease: Secondary | ICD-10-CM | POA: Diagnosis not present

## 2018-01-27 DIAGNOSIS — Z86718 Personal history of other venous thrombosis and embolism: Secondary | ICD-10-CM | POA: Diagnosis not present

## 2018-01-27 DIAGNOSIS — J189 Pneumonia, unspecified organism: Secondary | ICD-10-CM | POA: Diagnosis not present

## 2018-01-27 DIAGNOSIS — E870 Hyperosmolality and hypernatremia: Secondary | ICD-10-CM | POA: Diagnosis not present

## 2018-01-27 DIAGNOSIS — E669 Obesity, unspecified: Secondary | ICD-10-CM | POA: Diagnosis present

## 2018-01-27 DIAGNOSIS — Z452 Encounter for adjustment and management of vascular access device: Secondary | ICD-10-CM | POA: Diagnosis not present

## 2018-01-27 DIAGNOSIS — F419 Anxiety disorder, unspecified: Secondary | ICD-10-CM | POA: Diagnosis not present

## 2018-01-27 DIAGNOSIS — E1169 Type 2 diabetes mellitus with other specified complication: Secondary | ICD-10-CM | POA: Diagnosis not present

## 2018-01-27 DIAGNOSIS — I481 Persistent atrial fibrillation: Secondary | ICD-10-CM | POA: Diagnosis not present

## 2018-01-27 DIAGNOSIS — K5900 Constipation, unspecified: Secondary | ICD-10-CM | POA: Diagnosis present

## 2018-01-27 DIAGNOSIS — Z7982 Long term (current) use of aspirin: Secondary | ICD-10-CM

## 2018-01-27 DIAGNOSIS — F411 Generalized anxiety disorder: Secondary | ICD-10-CM | POA: Diagnosis not present

## 2018-01-27 DIAGNOSIS — J9601 Acute respiratory failure with hypoxia: Secondary | ICD-10-CM

## 2018-01-27 DIAGNOSIS — R29705 NIHSS score 5: Secondary | ICD-10-CM | POA: Diagnosis not present

## 2018-01-27 DIAGNOSIS — Z978 Presence of other specified devices: Secondary | ICD-10-CM

## 2018-01-27 DIAGNOSIS — F319 Bipolar disorder, unspecified: Secondary | ICD-10-CM | POA: Diagnosis not present

## 2018-01-27 DIAGNOSIS — R0603 Acute respiratory distress: Secondary | ICD-10-CM | POA: Diagnosis not present

## 2018-01-27 DIAGNOSIS — Z794 Long term (current) use of insulin: Secondary | ICD-10-CM

## 2018-01-27 DIAGNOSIS — K449 Diaphragmatic hernia without obstruction or gangrene: Secondary | ICD-10-CM | POA: Diagnosis not present

## 2018-01-27 DIAGNOSIS — X58XXXA Exposure to other specified factors, initial encounter: Secondary | ICD-10-CM | POA: Diagnosis present

## 2018-01-27 DIAGNOSIS — E785 Hyperlipidemia, unspecified: Secondary | ICD-10-CM | POA: Diagnosis present

## 2018-01-27 DIAGNOSIS — I4891 Unspecified atrial fibrillation: Secondary | ICD-10-CM | POA: Diagnosis not present

## 2018-01-27 DIAGNOSIS — Z79899 Other long term (current) drug therapy: Secondary | ICD-10-CM | POA: Diagnosis not present

## 2018-01-27 DIAGNOSIS — R279 Unspecified lack of coordination: Secondary | ICD-10-CM | POA: Diagnosis not present

## 2018-01-27 DIAGNOSIS — I5032 Chronic diastolic (congestive) heart failure: Secondary | ICD-10-CM | POA: Diagnosis present

## 2018-01-27 DIAGNOSIS — S8000XD Contusion of unspecified knee, subsequent encounter: Secondary | ICD-10-CM | POA: Diagnosis not present

## 2018-01-27 DIAGNOSIS — R197 Diarrhea, unspecified: Secondary | ICD-10-CM | POA: Diagnosis not present

## 2018-01-27 DIAGNOSIS — E871 Hypo-osmolality and hyponatremia: Secondary | ICD-10-CM | POA: Diagnosis not present

## 2018-01-27 DIAGNOSIS — D649 Anemia, unspecified: Secondary | ICD-10-CM

## 2018-01-27 DIAGNOSIS — T17890A Other foreign object in other parts of respiratory tract causing asphyxiation, initial encounter: Secondary | ICD-10-CM | POA: Diagnosis present

## 2018-01-27 DIAGNOSIS — N390 Urinary tract infection, site not specified: Secondary | ICD-10-CM | POA: Diagnosis not present

## 2018-01-27 DIAGNOSIS — R4020 Unspecified coma: Secondary | ICD-10-CM | POA: Diagnosis not present

## 2018-01-27 DIAGNOSIS — J80 Acute respiratory distress syndrome: Secondary | ICD-10-CM | POA: Diagnosis present

## 2018-01-27 DIAGNOSIS — R918 Other nonspecific abnormal finding of lung field: Secondary | ICD-10-CM | POA: Diagnosis not present

## 2018-01-27 DIAGNOSIS — E1165 Type 2 diabetes mellitus with hyperglycemia: Secondary | ICD-10-CM | POA: Diagnosis present

## 2018-01-27 DIAGNOSIS — I6529 Occlusion and stenosis of unspecified carotid artery: Secondary | ICD-10-CM | POA: Diagnosis not present

## 2018-01-27 DIAGNOSIS — J9600 Acute respiratory failure, unspecified whether with hypoxia or hypercapnia: Secondary | ICD-10-CM | POA: Diagnosis not present

## 2018-01-27 DIAGNOSIS — Z818 Family history of other mental and behavioral disorders: Secondary | ICD-10-CM | POA: Diagnosis not present

## 2018-01-27 DIAGNOSIS — I4581 Long QT syndrome: Secondary | ICD-10-CM | POA: Diagnosis not present

## 2018-01-27 DIAGNOSIS — J811 Chronic pulmonary edema: Secondary | ICD-10-CM | POA: Diagnosis not present

## 2018-01-27 DIAGNOSIS — F329 Major depressive disorder, single episode, unspecified: Secondary | ICD-10-CM | POA: Diagnosis present

## 2018-01-27 DIAGNOSIS — R531 Weakness: Secondary | ICD-10-CM | POA: Diagnosis not present

## 2018-01-27 DIAGNOSIS — R296 Repeated falls: Secondary | ICD-10-CM | POA: Diagnosis not present

## 2018-01-27 DIAGNOSIS — H5704 Mydriasis: Secondary | ICD-10-CM | POA: Diagnosis not present

## 2018-01-27 DIAGNOSIS — G4089 Other seizures: Secondary | ICD-10-CM | POA: Diagnosis not present

## 2018-01-27 LAB — URINALYSIS, ROUTINE W REFLEX MICROSCOPIC
BACTERIA UA: NONE SEEN
BILIRUBIN URINE: NEGATIVE
BILIRUBIN URINE: NEGATIVE
Bacteria, UA: NONE SEEN
GLUCOSE, UA: NEGATIVE mg/dL
Glucose, UA: >=500 mg/dL — AB
HGB URINE DIPSTICK: NEGATIVE
Hgb urine dipstick: NEGATIVE
KETONES UR: NEGATIVE mg/dL
KETONES UR: NEGATIVE mg/dL
LEUKOCYTES UA: NEGATIVE
NITRITE: NEGATIVE
NITRITE: NEGATIVE
PH: 6 (ref 5.0–8.0)
PH: 5 (ref 5.0–8.0)
Protein, ur: NEGATIVE mg/dL
Protein, ur: 30 mg/dL — AB
SPECIFIC GRAVITY, URINE: 1.013 (ref 1.005–1.030)
Specific Gravity, Urine: 1.021 (ref 1.005–1.030)
Squamous Epithelial / LPF: NONE SEEN
Squamous Epithelial / LPF: NONE SEEN

## 2018-01-27 LAB — GLUCOSE, CAPILLARY
Glucose-Capillary: 212 mg/dL — ABNORMAL HIGH (ref 65–99)
Glucose-Capillary: 134 mg/dL — ABNORMAL HIGH (ref 65–99)
Glucose-Capillary: 114 mg/dL — ABNORMAL HIGH (ref 65–99)

## 2018-01-27 LAB — BLOOD GAS, ARTERIAL
ACID-BASE DEFICIT: 2.9 mmol/L — AB (ref 0.0–2.0)
Bicarbonate: 21.6 mmol/L (ref 20.0–28.0)
DRAWN BY: 221791
FIO2: 100
O2 SAT: 89.7 %
PCO2 ART: 44.5 mmHg (ref 32.0–48.0)
PEEP: 5 cmH2O
PH ART: 7.319 — AB (ref 7.350–7.450)
RATE: 24 resp/min
VT: 450 mL
pO2, Arterial: 70.4 mmHg — ABNORMAL LOW (ref 83.0–108.0)

## 2018-01-27 LAB — HEMOGLOBIN A1C
Hgb A1c MFr Bld: 5.5 % (ref 4.8–5.6)
MEAN PLASMA GLUCOSE: 111.15 mg/dL

## 2018-01-27 LAB — I-STAT CHEM 8, ED
BUN: 7 mg/dL (ref 6–20)
CALCIUM ION: 0.93 mmol/L — AB (ref 1.15–1.40)
CREATININE: 0.8 mg/dL (ref 0.44–1.00)
Chloride: 95 mmol/L — ABNORMAL LOW (ref 101–111)
GLUCOSE: 257 mg/dL — AB (ref 65–99)
HCT: 34 % — ABNORMAL LOW (ref 36.0–46.0)
HEMOGLOBIN: 11.6 g/dL — AB (ref 12.0–15.0)
Potassium: 5 mmol/L (ref 3.5–5.1)
Sodium: 128 mmol/L — ABNORMAL LOW (ref 135–145)
TCO2: 22 mmol/L (ref 22–32)

## 2018-01-27 LAB — CBC WITH DIFFERENTIAL/PLATELET
BASOS ABS: 0 10*3/uL (ref 0.0–0.1)
BASOS PCT: 0 %
BASOS PCT: 0 %
Basophils Absolute: 0 10*3/uL (ref 0.0–0.1)
EOS ABS: 0 10*3/uL (ref 0.0–0.7)
EOS PCT: 0 %
EOS PCT: 0 %
Eosinophils Absolute: 0 10*3/uL (ref 0.0–0.7)
HEMATOCRIT: 10.6 % — AB (ref 36.0–46.0)
HEMATOCRIT: 31.3 % — AB (ref 36.0–46.0)
Hemoglobin: 3.5 g/dL — CL (ref 12.0–15.0)
Hemoglobin: 10.5 g/dL — ABNORMAL LOW (ref 12.0–15.0)
Lymphocytes Relative: 6 %
Lymphocytes Relative: 7 %
Lymphs Abs: 0.3 10*3/uL — ABNORMAL LOW (ref 0.7–4.0)
Lymphs Abs: 0.9 10*3/uL (ref 0.7–4.0)
MCH: 31.3 pg (ref 26.0–34.0)
MCH: 31.5 pg (ref 26.0–34.0)
MCHC: 33 g/dL (ref 30.0–36.0)
MCHC: 33.5 g/dL (ref 30.0–36.0)
MCV: 94.6 fL (ref 78.0–100.0)
MCV: 94 fL (ref 78.0–100.0)
MONO ABS: 0.3 10*3/uL (ref 0.1–1.0)
MONO ABS: 0.6 10*3/uL (ref 0.1–1.0)
Monocytes Relative: 6 %
Monocytes Relative: 5 %
NEUTROS ABS: 3.9 10*3/uL (ref 1.7–7.7)
NEUTROS ABS: 10.6 10*3/uL — AB (ref 1.7–7.7)
NEUTROS PCT: 88 %
Neutrophils Relative %: 88 %
PLATELETS: 63 10*3/uL — AB (ref 150–400)
PLATELETS: 180 10*3/uL (ref 150–400)
RBC: 1.12 MIL/uL — ABNORMAL LOW (ref 3.87–5.11)
RBC: 3.33 MIL/uL — ABNORMAL LOW (ref 3.87–5.11)
RDW: 14.5 % (ref 11.5–15.5)
RDW: 14.4 % (ref 11.5–15.5)
WBC: 4.5 10*3/uL (ref 4.0–10.5)
WBC: 12.1 10*3/uL — ABNORMAL HIGH (ref 4.0–10.5)

## 2018-01-27 LAB — PROTIME-INR
INR: 1.85
Prothrombin Time: 21.2 seconds — ABNORMAL HIGH (ref 11.4–15.2)

## 2018-01-27 LAB — COMPREHENSIVE METABOLIC PANEL
ALK PHOS: 125 U/L (ref 38–126)
ALT: 59 U/L — ABNORMAL HIGH (ref 14–54)
ANION GAP: 13 (ref 5–15)
AST: 61 U/L — ABNORMAL HIGH (ref 15–41)
Albumin: 2.9 g/dL — ABNORMAL LOW (ref 3.5–5.0)
BUN: 9 mg/dL (ref 6–20)
CALCIUM: 7.6 mg/dL — AB (ref 8.9–10.3)
CO2: 20 mmol/L — AB (ref 22–32)
Chloride: 96 mmol/L — ABNORMAL LOW (ref 101–111)
Creatinine, Ser: 0.73 mg/dL (ref 0.44–1.00)
GFR calc non Af Amer: >60 mL/min (ref >60)
Glucose, Bld: 261 mg/dL — ABNORMAL HIGH (ref 65–99)
POTASSIUM: 4.9 mmol/L (ref 3.5–5.1)
SODIUM: 129 mmol/L — AB (ref 135–145)
TOTAL PROTEIN: 6.5 g/dL (ref 6.5–8.1)
Total Bilirubin: 1.4 mg/dL — ABNORMAL HIGH (ref 0.3–1.2)

## 2018-01-27 LAB — POCT I-STAT 3, ART BLOOD GAS (G3+)
ACID-BASE DEFICIT: 2 mmol/L (ref 0.0–2.0)
Bicarbonate: 24.6 mmol/L (ref 20.0–28.0)
O2 Saturation: 98 %
Patient temperature: 100.9
TCO2: 26 mmol/L (ref 22–32)
pCO2 arterial: 51.6 mmHg — ABNORMAL HIGH (ref 32.0–48.0)
pH, Arterial: 7.292 — ABNORMAL LOW (ref 7.350–7.450)
pO2, Arterial: 124 mmHg — ABNORMAL HIGH (ref 83.0–108.0)

## 2018-01-27 LAB — RESPIRATORY PANEL BY PCR
Adenovirus: NOT DETECTED
BORDETELLA PERTUSSIS-RVPCR: NOT DETECTED
CORONAVIRUS 229E-RVPPCR: NOT DETECTED
Chlamydophila pneumoniae: NOT DETECTED
Coronavirus HKU1: NOT DETECTED
Coronavirus NL63: NOT DETECTED
Coronavirus OC43: NOT DETECTED
INFLUENZA B-RVPPCR: NOT DETECTED
Influenza A: NOT DETECTED
MYCOPLASMA PNEUMONIAE-RVPPCR: NOT DETECTED
Metapneumovirus: NOT DETECTED
Parainfluenza Virus 1: NOT DETECTED
Parainfluenza Virus 2: NOT DETECTED
Parainfluenza Virus 3: NOT DETECTED
Parainfluenza Virus 4: NOT DETECTED
RESPIRATORY SYNCYTIAL VIRUS-RVPPCR: NOT DETECTED
Rhinovirus / Enterovirus: NOT DETECTED

## 2018-01-27 LAB — INFLUENZA PANEL BY PCR (TYPE A & B)
Influenza A By PCR: NEGATIVE
Influenza B By PCR: NEGATIVE

## 2018-01-27 LAB — ABO/RH: ABO/RH(D): A POS

## 2018-01-27 LAB — LACTIC ACID, PLASMA
LACTIC ACID, VENOUS: 1.3 mmol/L (ref 0.5–1.9)
Lactic Acid, Venous: 4.5 mmol/L (ref 0.5–1.9)

## 2018-01-27 LAB — I-STAT TROPONIN, ED: TROPONIN I, POC: 0.01 ng/mL (ref 0.00–0.08)

## 2018-01-27 LAB — POC OCCULT BLOOD, ED: Fecal Occult Bld: POSITIVE — AB

## 2018-01-27 LAB — I-STAT CG4 LACTIC ACID, ED
LACTIC ACID, VENOUS: 1.06 mmol/L (ref 0.5–1.9)
Lactic Acid, Venous: 3.45 mmol/L (ref 0.5–1.9)

## 2018-01-27 LAB — STREP PNEUMONIAE URINARY ANTIGEN: STREP PNEUMO URINARY ANTIGEN: NEGATIVE

## 2018-01-27 LAB — PREPARE RBC (CROSSMATCH)

## 2018-01-27 LAB — MRSA PCR SCREENING: MRSA by PCR: NEGATIVE

## 2018-01-27 MED ORDER — SODIUM CHLORIDE 0.9 % IV SOLN
Freq: Once | INTRAVENOUS | Status: AC
  Administered 2018-01-27: 09:00:00 via INTRAVENOUS

## 2018-01-27 MED ORDER — CHLORHEXIDINE GLUCONATE CLOTH 2 % EX PADS
6.0000 | MEDICATED_PAD | Freq: Every day | CUTANEOUS | Status: DC
  Administered 2018-01-27 – 2018-02-06 (×10): 6 via TOPICAL

## 2018-01-27 MED ORDER — VITAMIN K1 10 MG/ML IJ SOLN
5.0000 mg | Freq: Once | INTRAVENOUS | Status: DC
  Filled 2018-01-27: qty 0.5

## 2018-01-27 MED ORDER — HEPARIN SODIUM (PORCINE) 5000 UNIT/ML IJ SOLN
5000.0000 [IU] | Freq: Three times a day (TID) | INTRAMUSCULAR | Status: DC
  Administered 2018-01-27 – 2018-01-28 (×3): 5000 [IU] via SUBCUTANEOUS
  Filled 2018-01-27 (×4): qty 1

## 2018-01-27 MED ORDER — PROPOFOL 1000 MG/100ML IV EMUL
INTRAVENOUS | Status: AC
  Filled 2018-01-27: qty 100

## 2018-01-27 MED ORDER — LORAZEPAM 2 MG/ML IJ SOLN
INTRAMUSCULAR | Status: AC
  Filled 2018-01-27: qty 1

## 2018-01-27 MED ORDER — SODIUM CHLORIDE 0.9 % IV SOLN
0.5000 mg/h | INTRAVENOUS | Status: DC
  Administered 2018-01-27 (×2): 0.5 mg/h via INTRAVENOUS
  Filled 2018-01-27 (×2): qty 10

## 2018-01-27 MED ORDER — FENTANYL 2500MCG IN NS 250ML (10MCG/ML) PREMIX INFUSION
0.0000 ug/h | INTRAVENOUS | Status: DC
  Filled 2018-01-27: qty 250

## 2018-01-27 MED ORDER — LORAZEPAM 2 MG/ML IJ SOLN
2.0000 mg | Freq: Once | INTRAMUSCULAR | Status: AC
  Administered 2018-01-27: 2 mg via INTRAVENOUS

## 2018-01-27 MED ORDER — LORAZEPAM 2 MG/ML IJ SOLN
2.0000 mg | Freq: Once | INTRAMUSCULAR | Status: AC
  Administered 2018-01-27: 2 mg via INTRAVENOUS
  Filled 2018-01-27: qty 1

## 2018-01-27 MED ORDER — ACETAMINOPHEN 325 MG PO TABS
325.0000 mg | ORAL_TABLET | ORAL | Status: DC | PRN
  Administered 2018-01-27: 325 mg
  Filled 2018-01-27: qty 1

## 2018-01-27 MED ORDER — LACTATED RINGERS IV BOLUS
1000.0000 mL | Freq: Once | INTRAVENOUS | Status: AC
  Administered 2018-01-27: 1000 mL via INTRAVENOUS

## 2018-01-27 MED ORDER — SODIUM CHLORIDE 0.9 % IV SOLN
250.0000 mL | INTRAVENOUS | Status: DC | PRN
  Administered 2018-01-27 – 2018-02-06 (×3): 250 mL via INTRAVENOUS

## 2018-01-27 MED ORDER — ALBUTEROL (5 MG/ML) CONTINUOUS INHALATION SOLN
10.0000 mg/h | INHALATION_SOLUTION | Freq: Once | RESPIRATORY_TRACT | Status: AC
  Administered 2018-01-27: 10 mg/h via RESPIRATORY_TRACT
  Filled 2018-01-27: qty 20

## 2018-01-27 MED ORDER — ORAL CARE MOUTH RINSE
15.0000 mL | Freq: Four times a day (QID) | OROMUCOSAL | Status: DC
  Administered 2018-01-27: 15 mL via OROMUCOSAL

## 2018-01-27 MED ORDER — INSULIN ASPART 100 UNIT/ML ~~LOC~~ SOLN: 0.0000 [IU] | Freq: Three times a day (TID) | SUBCUTANEOUS | Status: DC

## 2018-01-27 MED ORDER — ETOMIDATE 2 MG/ML IV SOLN
20.0000 mg | Freq: Once | INTRAVENOUS | Status: AC
  Administered 2018-01-27: 20 mg via INTRAVENOUS

## 2018-01-27 MED ORDER — LORAZEPAM 2 MG/ML IJ SOLN: 0.5000 mg/h | INTRAVENOUS | Status: DC

## 2018-01-27 MED ORDER — PANTOPRAZOLE SODIUM 40 MG IV SOLR
40.0000 mg | Freq: Every day | INTRAVENOUS | Status: DC
  Administered 2018-01-27: 40 mg via INTRAVENOUS
  Filled 2018-01-27: qty 40

## 2018-01-27 MED ORDER — SUCCINYLCHOLINE CHLORIDE 20 MG/ML IJ SOLN
125.0000 mg | Freq: Once | INTRAMUSCULAR | Status: AC
  Administered 2018-01-27: 125 mg via INTRAVENOUS

## 2018-01-27 MED ORDER — PROPOFOL 10 MG/ML IV BOLUS
0.2500 mg/kg | Freq: Once | INTRAVENOUS | Status: AC
  Administered 2018-01-27: 19.6 mg via INTRAVENOUS

## 2018-01-27 MED ORDER — FENTANYL CITRATE (PF) 100 MCG/2ML IJ SOLN
100.0000 ug | Freq: Once | INTRAMUSCULAR | Status: AC
  Administered 2018-01-27: 100 ug via INTRAVENOUS

## 2018-01-27 MED ORDER — SODIUM CHLORIDE 0.9 % IV SOLN
0.5000 mg/h | INTRAVENOUS | Status: DC
  Filled 2018-01-27: qty 10

## 2018-01-27 MED ORDER — FENTANYL CITRATE (PF) 100 MCG/2ML IJ SOLN
INTRAMUSCULAR | Status: AC
  Filled 2018-01-27: qty 2

## 2018-01-27 MED ORDER — CHLORHEXIDINE GLUCONATE 0.12% ORAL RINSE (MEDLINE KIT)
15.0000 mL | Freq: Two times a day (BID) | OROMUCOSAL | Status: DC
  Administered 2018-01-28: 15 mL via OROMUCOSAL

## 2018-01-27 MED ORDER — LEVETIRACETAM IN NACL 1000 MG/100ML IV SOLN
1000.0000 mg | Freq: Two times a day (BID) | INTRAVENOUS | Status: DC
  Administered 2018-01-27 – 2018-02-10 (×28): 1000 mg via INTRAVENOUS
  Filled 2018-01-27 (×30): qty 100

## 2018-01-27 MED ORDER — PIPERACILLIN-TAZOBACTAM 3.375 G IVPB 30 MIN
3.3750 g | Freq: Once | INTRAVENOUS | Status: AC
  Administered 2018-01-27: 3.375 g via INTRAVENOUS
  Filled 2018-01-27: qty 50

## 2018-01-27 MED ORDER — IBUPROFEN 100 MG/5ML PO SUSP
400.0000 mg | Freq: Four times a day (QID) | ORAL | Status: DC | PRN
  Administered 2018-01-28: 400 mg
  Filled 2018-01-27 (×2): qty 20

## 2018-01-27 MED ORDER — INSULIN GLARGINE 100 UNIT/ML ~~LOC~~ SOLN
10.0000 [IU] | Freq: Every day | SUBCUTANEOUS | Status: DC
  Administered 2018-01-27: 10 [IU] via SUBCUTANEOUS
  Filled 2018-01-27: qty 0.1

## 2018-01-27 MED ORDER — LACTATED RINGERS IV SOLN
INTRAVENOUS | Status: DC
  Administered 2018-01-27 – 2018-01-28 (×4): via INTRAVENOUS

## 2018-01-27 MED ORDER — OSELTAMIVIR PHOSPHATE 75 MG PO CAPS
75.0000 mg | ORAL_CAPSULE | Freq: Two times a day (BID) | ORAL | Status: DC
  Filled 2018-01-27: qty 1

## 2018-01-27 MED ORDER — NOREPINEPHRINE BITARTRATE 1 MG/ML IV SOLN
5.0000 ug/min | INTRAVENOUS | Status: DC
  Administered 2018-01-27: 10 ug/min via INTRAVENOUS
  Administered 2018-01-28: 8 ug/min via INTRAVENOUS
  Administered 2018-01-28 – 2018-01-30 (×2): 5 ug/min via INTRAVENOUS
  Filled 2018-01-27 (×4): qty 4

## 2018-01-27 MED ORDER — SODIUM CHLORIDE 0.9% FLUSH
10.0000 mL | INTRAVENOUS | Status: DC | PRN
  Administered 2018-02-03: 10 mL
  Filled 2018-01-27: qty 40

## 2018-01-27 MED ORDER — SODIUM CHLORIDE 0.9 % IV SOLN
0.0000 ug/h | INTRAVENOUS | Status: DC
  Administered 2018-01-27: 50 ug/h via INTRAVENOUS
  Filled 2018-01-27: qty 50

## 2018-01-27 MED ORDER — SODIUM CHLORIDE 0.9% FLUSH
10.0000 mL | Freq: Two times a day (BID) | INTRAVENOUS | Status: DC
  Administered 2018-01-28 – 2018-02-05 (×11): 10 mL

## 2018-01-27 MED ORDER — FENTANYL CITRATE (PF) 100 MCG/2ML IJ SOLN
50.0000 ug | INTRAMUSCULAR | Status: DC | PRN
  Administered 2018-01-28: 50 ug via INTRAVENOUS

## 2018-01-27 MED ORDER — PROPOFOL 1000 MG/100ML IV EMUL
5.0000 ug/kg/min | Freq: Once | INTRAVENOUS | Status: AC
  Administered 2018-01-27: 5 ug/kg/min via INTRAVENOUS

## 2018-01-27 MED ORDER — PIPERACILLIN-TAZOBACTAM 3.375 G IVPB
3.3750 g | Freq: Three times a day (TID) | INTRAVENOUS | Status: AC
  Administered 2018-01-27 – 2018-02-02 (×19): 3.375 g via INTRAVENOUS
  Filled 2018-01-27 (×19): qty 50

## 2018-01-27 MED ORDER — PROPOFOL 10 MG/ML IV BOLUS
50.0000 mg | Freq: Once | INTRAVENOUS | Status: AC
  Administered 2018-01-27: 50 mg via INTRAVENOUS

## 2018-01-27 MED ORDER — VANCOMYCIN HCL IN DEXTROSE 1-5 GM/200ML-% IV SOLN
1000.0000 mg | Freq: Once | INTRAVENOUS | Status: AC
  Administered 2018-01-27: 1000 mg via INTRAVENOUS
  Filled 2018-01-27: qty 200

## 2018-01-27 MED ORDER — SODIUM CHLORIDE 0.9 % IV SOLN
500.0000 mg | Freq: Every day | INTRAVENOUS | Status: AC
  Administered 2018-01-27 – 2018-01-31 (×5): 500 mg via INTRAVENOUS
  Filled 2018-01-27 (×5): qty 500

## 2018-01-27 MED ORDER — VANCOMYCIN HCL IN DEXTROSE 750-5 MG/150ML-% IV SOLN
750.0000 mg | Freq: Three times a day (TID) | INTRAVENOUS | Status: DC
  Administered 2018-01-27 – 2018-01-29 (×6): 750 mg via INTRAVENOUS
  Filled 2018-01-27 (×7): qty 150

## 2018-01-27 MED ORDER — SODIUM CHLORIDE 0.9 % IV BOLUS
1000.0000 mL | Freq: Once | INTRAVENOUS | Status: AC
  Administered 2018-01-27: 1000 mL via INTRAVENOUS

## 2018-01-27 NOTE — Progress Notes (Signed)
Art line attempted x 2.  Unable to thread catheter.  Pt has significant bruising on wrist from a previous needle stick.  MD notified.

## 2018-01-27 NOTE — ED Notes (Signed)
[place] in [place], she states patient's HGB is 10.5 from latest lab draw.

## 2018-01-27 NOTE — ED Notes (Signed)
CRITICAL VALUE ALERT  Critical Value:  Lactic 4.5  Date & Time Notied:  01/27/2018 at 1144  Provider Notified: Dr. Effie ShyWentz  Orders Received/Actions taken:

## 2018-01-27 NOTE — Procedures (Signed)
Central venous catheter placement  Central venous catheter was placed for administration of pressors in the sampling of central venous gases.  No surrogate for the patient was available for permission and the procedure was performed as medically indicated.  Area over the left subclavian was sterilely prepped with chlorhexidine and widely draped.  Sterile garb was donned.  The left subclavian vein was easily cannulated and a wire passed.  There was slight resistance to passage of the wire.  The tract was dilated and a 20 cm non-sulfa-containing catheter was advanced in 19 cm.  There was good flow from all ports.  The catheter was sutured in place and a sterile dressing applied.  Chest x-ray showed that the catheter had gone up the left IJ therefore the area over the right internal jugular was sterilely prepped with chlorhexidine and widely draped.  Again donning sterile garb the area was widely draped and the IJ was sterilely visualized with ultrasound.  The vein was easily cannulated and of wire gently passed.  The tract was dilated and a 20 cm sulfa free 7 French triple-lumen catheter was passed to 16 cm.  There was good flow from all ports.  The catheter was sutured in place and a sterile dressing applied.

## 2018-01-27 NOTE — Procedures (Signed)
An arterial catheter was placed for frequent blood draws and continuous blood pressure monitoring.  No surrogate was available for this patient.  Timeout was performed.  Area over the left femoral artery was sterilely prepped with chlorhexidine and widely draped.  Under sterile conditions the artery was identified by palpation and easily cannulated.  A wire was gently passed and a 20-gauge arterial catheter gently placed over the wire.  Catheter was sutured in place and a sterile dressing applied.  There was good tracing and flow.

## 2018-01-27 NOTE — ED Notes (Signed)
Patient moved to room 2 for intubation. Dr Effie ShyWentz at bedside.

## 2018-01-27 NOTE — H&P (Addendum)
PULMONARY / CRITICAL CARE MEDICINE   Name: Natasha Martin MRN: [id number] DOB: 1961-09-16    ADMISSION DATE:  01/27/2018   CHIEF COMPLAINT: Hypoxia  HISTORY OF PRESENT ILLNESS:   Unfortunately we have essentially no immediate history on this 57 year old with diabetes felt mental delay and schizophrenia who also has a reported history of seizures in the chart.  For the past 2-3 days she has been having a productive cough and she was found in the bathroom this morning unresponsive.  When EMS arrived her saturations were in the 20s.  She was taking to [facility] where she was intubated and chest x-ray has shown diffuse bilateral infiltrates.  PAST MEDICAL HISTORY :  She  has a past medical history of Anxiety, Diabetes (HCC), Dysphagia, Hyperlipidemia, Hypertension, Mild mental slowing, Poor historian, Schizophrenia (HCC), and Seizures (HCC).  PAST SURGICAL HISTORY: She  has a past surgical history that includes Leg Surgery (Bilateral); Colonoscopy with propofol (N/A, 01/28/2014); Esophagogastroduodenoscopy (egd) with propofol (N/A, 01/28/2014); Savory dilation (N/A, 01/28/2014); polypectomy (01/28/2014); and biopsy (01/28/2014).  Allergies  Allergen Reactions  . Sulfa Antibiotics Rash    No current facility-administered medications on file prior to encounter.    Current Outpatient Medications on File Prior to Encounter  Medication Sig  . acetaminophen (TYLENOL) 325 MG tablet Take 650 mg by mouth every 6 (six) hours as needed for moderate pain.  Marland Kitchen amiodarone (PACERONE) 200 MG tablet Take 1 tablet (200 mg total) by mouth 2 (two) times daily.  Marland Kitchen atorvastatin (LIPITOR) 40 MG tablet Take 40 mg by mouth daily.  . benztropine (COGENTIN) 2 MG tablet Take 2 mg by mouth 2 (two) times daily.   . cetirizine (ZYRTEC) 10 MG tablet Take 10 mg by mouth daily.  . diazepam (VALIUM) 10 MG tablet Take 1 tablet (10 mg total) by mouth daily. (Patient taking differently: Take 10 mg by mouth 2 (two) times  daily as needed for anxiety. )  . glimepiride (AMARYL) 4 MG tablet Take 4 mg by mouth 2 (two) times daily.   Marland Kitchen guaiFENesin-dextromethorphan (ROBITUSSIN DM) 100-10 MG/5ML syrup Take 10 mLs by mouth every 4 (four) hours as needed for cough.  . insulin aspart (NOVOLOG) 100 UNIT/ML injection Inject 10 Units into the skin 4 (four) times daily. Give 8 units QID as long as Blood Sugar is above 150.   Marland Kitchen insulin glargine (LANTUS) 100 UNIT/ML injection Inject 20 Units into the skin at bedtime.  Marland Kitchen lurasidone (LATUDA) 80 MG TABS tablet Take 80 mg by mouth 2 (two) times daily.  . metoprolol succinate (TOPROL-XL) 100 MG 24 hr tablet Take 1 tablet (100 mg total) by mouth 2 (two) times daily before a meal. Take with or immediately following a meal.  . Omega-3 Fatty Acids (OMEGA-3 FISH OIL PO) Take 1,000 mg by mouth 2 (two) times daily.   Marland Kitchen omeprazole (PRILOSEC) 20 MG capsule 1 po bid 30 minutes before meals for 3 mos then once daily FOREVER (Patient taking differently: Take 20 mg by mouth 2 (two) times daily before a meal. )  . QUEtiapine (SEROQUEL) 200 MG tablet Take 3 tablets (600 mg total) by mouth at bedtime. (Patient taking differently: Take 400-600 mg by mouth 2 (two) times daily. 600 MG IN THE MORNING AND 400 MG IN EVENING)  . sodium chloride 1 g tablet Take 1 g by mouth 3 (three) times daily with meals.  . warfarin (COUMADIN) 2 MG tablet Take 2 mg by mouth daily. Takes 2 mg daily Monday through  Friday  . warfarin (COUMADIN) 3 MG tablet Take 3 mg by mouth daily. Patient takes 3 mg on Saturday and Sunday  . acetaminophen-codeine (TYLENOL #3) 300-30 MG tablet Take 1 tablet by mouth every 4 (four) hours as needed for moderate pain. (Patient not taking: Reported on 01/27/2018)  . carbamazepine (TEGRETOL) 200 MG tablet Take 400 mg by mouth 2 (two) times daily.   . clotrimazole-betamethasone (LOTRISONE) cream Apply 1 application topically daily as needed (dry skin and rashes).   . CRESTOR 20 MG tablet Take 20 mg by  mouth daily.   . fenofibrate (TRICOR) 145 MG tablet Take 145 mg by mouth daily.   Marland Kitchen glyBURIDE (DIABETA) 5 MG tablet Take 2.5 mg by mouth 2 (two) times daily with a meal.   . haloperidol (HALDOL) 10 MG tablet Take 1 tablet (10 mg total) by mouth 2 (two) times daily. (Patient not taking: Reported on 01/27/2018)  . HYDROcodone-acetaminophen (NORCO/VICODIN) 5-325 MG tablet Take 1 tablet by mouth every 4 (four) hours as needed. (Patient not taking: Reported on 01/27/2018)  . JANTOVEN 2.5 MG tablet Take 5 mg by mouth daily. On sat and sun 2.5 mg on mon,tues,wed, thurs, fri  . LORazepam (ATIVAN) 1 MG tablet Take 1 tablet (1 mg total) by mouth 2 (two) times daily as needed for anxiety (anxiety). (Patient not taking: Reported on 01/27/2018)  . metFORMIN (GLUCOPHAGE) 1000 MG tablet Take 1,000 mg by mouth 2 (two) times daily with a meal.  . ondansetron (ZOFRAN) 4 MG tablet Take 1 tablet (4 mg total) by mouth every 6 (six) hours as needed for nausea. (Patient not taking: Reported on 01/27/2018)  . sucralfate (CARAFATE) 1 G tablet Take 1 g by mouth 2 (two) times daily.   . traZODone (DESYREL) 50 MG tablet Take 1 tablet (50 mg total) by mouth at bedtime as needed for sleep. (Patient not taking: Reported on 01/27/2018)  . warfarin (COUMADIN) 5 MG tablet Take 1 tablet (5 mg total) by mouth daily. (Patient not taking: Reported on 01/27/2018)    FAMILY HISTORY:  Her indicated that the status of her unknown relative is unknown.   SOCIAL HISTORY: She  reports that she has never smoked. She has never used smokeless tobacco. She reports that she does not drink alcohol or use drugs.  REVIEW OF SYSTEMS:   Not obtainable SUBJECTIVE:  Not obtainable  VITAL SIGNS: BP (!) 119/58   Pulse 61   Temp 100.2 F (37.9 C)   Resp (!) 23   Wt 173 lb (78.5 kg)   LMP 01/21/2014   SpO2 99%   BMI 30.65 kg/m   HEMODYNAMICS:    VENTILATOR SETTINGS: Vent Mode: PRVC FiO2 (%):  [100 %] 100 % Set Rate:  [24 bmp] 24 bmp Vt  Set:  [450 mL] 450 mL PEEP:  [5 cmH20] 5 cmH20 Plateau Pressure:  [27 cmH20] 27 cmH20  INTAKE / OUTPUT: No intake/output data recorded.  PHYSICAL EXAMINATION: General: This is an obese middle-aged female who is orally intubated mechanically ventilated and sedated.  She is not interactive. Neuro: She is not responsive to voice.  She does move all fours to sternal rub but does not eye open.  Pupils are equal and EOMs appear to be full.  The face is symmetric Cardiovascular: S1 and S2 are regular without murmur rub or gallop.  She has normal capillary refill. Lungs: There is symmetric air movement, there are lots of scattered rhonchi and some wheezes.  Peak inspiratory pressure is only 22 with  a tidal volume of 450 and 5 of PEEP Abdomen: Abdomen is obese and soft without any organomegaly masses tenderness guarding or rebound.  She is anicteric Musculoskeletal: Is a healing longitudinal abrasion on the anterior left leg She does not have dependent edema  LABS:  BMET Recent Labs  Lab 01/27/18 0854 01/27/18 0904  NA 129* 128*  K 4.9 5.0  CL 96* 95*  CO2 20*  --   BUN 9 7  CREATININE 0.73 0.80  GLUCOSE 261* 257*    Electrolytes Recent Labs  Lab 01/27/18 0854  CALCIUM 7.6*    CBC Recent Labs  Lab 01/27/18 0747 01/27/18 0855 01/27/18 0904  WBC 4.5 12.1*  --   HGB 3.5* 10.5* 11.6*  HCT 10.6* 31.3* 34.0*  PLT 63* 180  --     Coag's Recent Labs  Lab 01/27/18 0854  INR 1.85    Sepsis Markers Recent Labs  Lab 01/27/18 0828 01/27/18 0904 01/27/18 1050  LATICACIDVEN 1.06 3.45* 4.5*    ABG Recent Labs  Lab 01/27/18 0820  PHART 7.319*  PCO2ART 44.5  PO2ART 70.4*    Liver Enzymes Recent Labs  Lab 01/27/18 0854  AST 61*  ALT 59*  ALKPHOS 125  BILITOT 1.4*  ALBUMIN 2.9*    Cardiac Enzymes No results for input(s): TROPONINI, PROBNP in the last 168 hours.  Glucose Recent Labs  Lab 01/27/18 1338  GLUCAP 212*    Imaging Dg Chest Port 1  View  Result Date: 01/27/2018 CLINICAL DATA:  Intubation. EXAM: PORTABLE CHEST 1 VIEW COMPARISON:  01/20/2016 FINDINGS: Endotracheal tube tip 16 mm above the carina. An orogastric tube reaches the stomach at least. Low lung volumes with extensive bilateral airspace opacity. No visible pneumothorax. Heart size and mediastinal contours are obscured. Partially visualized right humeral head which shows possible dislocation and fragmentation. There was a displaced fracture by 2016 radiography. Remote left rib fracture. IMPRESSION: 1. Endotracheal tube tip 16 mm above the carina. An orogastric tube reaches the stomach. 2. Extensive airspace disease which could reflect pneumonia, cardiogenic edema, and/or ARDS. Electronically Signed   By: Marnee Spring M.D.   On: 01/27/2018 08:15     ANTIBIOTICS: Vancomycin, Zosyn and azithromycin initiated on 3/30.  Tamiflu discontinued when PCR for influenza came back negative   LINES/TUBES: A left subclavian triple-lumen catheter was inserted on 3/30 and a line inserted on 3/30  DISCUSSION:      This is a 57 year old schizophrenic with developmental delay and diabetes who presents with an ARDS picture hypoxia and unresponsiveness after 2-3 days of productive cough.  ASSESSMENT / PLAN:  PULMONARY A: ARDS.  Based on my limited bedside echo I do not suspect that there is a cardiac component to her diffuse infiltrates although her LV function is not normal.  I am broadly covering her for respiratory pathogens with azithromycin vancomycin and Zosyn, being most suspicious of aspiration.  At present her peak pressures are in the low 20s and I am comfortable with her tidal volume of 450 cc.   CARDIOVASCULAR A: As noted I very much doubt that her presentation is entirely due to cardiac dysfunction.  She does have a somewhat dilated LV with decreased function by my exam but her vena cava is small and collapsible and I suspect LV function is not the sole provocation for  diffuse infiltrates.  I have requested a formal echocardiogram but will be proceeding with a standard sepsis resuscitation.  She is only received 1 L bolus and she has a  collapsible vena cava, I have ordered a second liter bolus and will be following serial lactates and urine output as indicators of the adequacy of resuscitation.  I have not resumed her amiodarone which she is taking  for atrial fibrillation, toxicity from that agent certainly is in the differential of her lung findings.  Anticoagulation for intermittent atrial fibrillation can be resumed if the head CT is normal  RENAL A: Creatinine is normal  GASTROINTESTINAL A: Prophylaxis will be with Protonix  HEMATOLOGIC A: DVT prophylaxis has been ordered with subcu heparin  INFECTIOUS A: Although she is not overtly febrile and has only a modest white count, this picture is most consistent with ARDS secondary to infection.  Her influenza PCR is negative.  I have covered her very broadly for aspiration as well as atypical pathogens pending laboratory studies.  My suspicion of an extra pulmonary infectious revocation for ARDS is very low.     ENDOCRINE A: The patient is a known diabetic.  I have placed her on a modest at bedtime dose of Lantus and sliding scale insulin for now.   NEUROLOGIC A: She is grossly nonfocal however I would like to image her head at some point to ensure that and altered level of consciousness did not preclude aspiration and ARDS.  I will order a scan when she demonstrates herself to be hemodynamically stable.  I have started a dose of Keppra in the event that this scenario represents a seizure followed by aspiration.  Greater than 32 minutes was spent in the care of this acutely ill patient today who requires mechanical ventilation for life support.  This time does not include time spent performing procedures.  Penny PiaWJ Kayanna Mckillop, MD Critical Care Medicine Pager: [phone]  01/27/2018, 2:04 PM

## 2018-01-27 NOTE — ED Notes (Signed)
CRITICAL VALUE ALERT  Critical Value:  hmg  Date & Time Notied:  01/27/18  Provider Notified: Effie ShyWentz  Orders Received/Actions taken: hemoccult stool

## 2018-01-27 NOTE — Progress Notes (Signed)
eLink Physician-Brief Progress Note Patient Name: Retta Macaula S Forness DOB: 1961-08-28 MRN: [id number]   Date of Service  01/27/2018  HPI/Events of Note  Multiple issues: 1. Agitation - Fentanyl IV infusion at ceiling and 2. AST and ALT mildly elevation. Patient is on Tylenol for fever which is now 101.1 F. Creatinine = 0.8.   eICU Interventions  Will order: 1. Change Versed from continuous to 0.5 to 6 mg/hour. Titrate to RASS 0 to -1. 2. Fentanyl 50-100 mcg IV Q 1 hour PRN agitation.  3. D/C Tylenol. 4. Motrin liquid 400 mg per tube Q 6 hours PRN Temp > 100.5 F.     Intervention Category Major Interventions: Other:  Lenell AntuSommer,Steven Eugene 01/27/2018, 11:52 PM

## 2018-01-27 NOTE — Progress Notes (Signed)
eLink Physician-Brief Progress Note Patient Name: Natasha Martin DOB: 12/24/1960 MRN: [id number]   Date of Service  01/27/2018  HPI/Events of Note  Hypotension - BP = 80/40 with MAP = 53. CVP = 10.  Last LVEF = 60% to 65%.  eICU Interventions  Will order: 1. Bolus with 0.9 NaCl 1 liter IV over 1 hour now.  2. Please started ordered Norepinephrine IV infusion. Titrate to MAP > 65.      Intervention Category Major Interventions: Hypotension - evaluation and management  Izora Benn Eugene 01/27/2018, 7:27 PM

## 2018-01-27 NOTE — Progress Notes (Signed)
Pharmacy Note:  Initial antibiotic(s) regimen of Vancomycin and Zosyn ordered by EDP to treat pneumonia.  CrCl cannot be calculated (Unknown ideal weight.).   Allergies  Allergen Reactions  . Sulfa Antibiotics Rash    Vitals:   01/27/18 1200 01/27/18 1215  BP: (!) 113/59 (!) 119/58  Pulse: 61   Resp: (!) 23   Temp: 100 F (37.8 C) 100.2 F (37.9 C)  SpO2: 99%     Anti-infectives (From admission, onward)   Start     Dose/Rate Route Frequency Ordered Stop   01/27/18 1245  oseltamivir (TAMIFLU) capsule 75 mg     75 mg Per Tube 2 times daily 01/27/18 1236 02/01/18 0959   01/27/18 1245  azithromycin (ZITHROMAX) 500 mg in sodium chloride 0.9 % 250 mL IVPB     500 mg 250 mL/hr over 60 Minutes Intravenous Every 24 hours 01/27/18 1237     01/27/18 0945  piperacillin-tazobactam (ZOSYN) IVPB 3.375 g     3.375 g 100 mL/hr over 30 Minutes Intravenous  Once 01/27/18 0930 01/27/18 1013   01/27/18 0945  vancomycin (VANCOCIN) IVPB 1000 mg/200 mL premix     1,000 mg 200 mL/hr over 60 Minutes Intravenous  Once 01/27/18 0930 01/27/18 1117     Plan: Initial dose(s) of Vancomycin and Zosyn X 1 ordered. F/U admission orders for further dosing if therapy continued.  Mady GemmaHayes, Shatima Zalar R, [facility] TulsaRPH 01/27/2018 1:07 PM

## 2018-01-27 NOTE — ED Provider Notes (Addendum)
[facility] Provider Note   CSN: [id number] Arrival date & time: 01/27/18  0254     History   Chief Complaint Chief Complaint  Patient presents with  . Shortness of Breath    HPI Natasha Martin is a 57 y.o. female.  She presents by EMS for altered mental status, with confusion, difficulty speaking, and inability to obtain oxygen saturation.  EMS report they treated her with oxygen on the way in and the best saturation they got was 67%;  they did not believe it required intubation.  EMS report that she was able to talk to them and tell them her name.  No one is with the patient.  Level 5 caveat-altered mental status  The patient's sister arrived after she was intubated was able to give additional history.  Patient has been ill for about 1 week with "a cold," manifested by cough, wheezing, and gradually worse confusion.  At baseline the patient has severe psychiatric illness, and has previously been in a group home, but now living with her sister who is a retired [employer] scientist.  Her sister listen to her lungs, last night and thought there was "crackles on the left."  Her sister gave the patient an albuterol nebulizer last night before bed which seemed to help.  Today her sister noticed that she was mumbling, and not able to talk as much as usual, she then heard the patient, for "help,", and found her sitting on a commode, slumped to the side, and having increased difficulty breathing.  Her sister listed her chin to open her airway, and called EMS.  EMS arrived to find her on the commode, placed on stretcher and ultimately transferred her here.     HPI  Past Medical History:  Diagnosis Date  . Anxiety   . Diabetes ([place])   . Dysphagia   . Hyperlipidemia   . Hypertension   . Mild mental slowing   . Poor historian   . Schizophrenia ([place])   . Seizures ([place])    has not had a seizure since last year. Unknown orgin- no family available to come and patient does  not know.    Patient Active Problem List   Diagnosis Date Noted  . Acute respiratory failure ([place]) 01/27/2018  . ARDS (adult respiratory distress syndrome) ([place]) 01/27/2018  . Pain in joint, shoulder region 07/23/2015  . GERD (gastroesophageal reflux disease) 07/23/2015  . Hyponatremia secondary to SA IDH 02/18/2015  . Hypokalemia 02/18/2015  . Hypophosphatemia 02/18/2015  . Elevated d-dimer 02/18/2015  . Schizophrenia ([place]) 02/18/2015  . Atrial fibrillation ([place])   . Atrial flutter with rapid ventricular response ([place]) 02/13/2015  . Ileus ([place])   . Acute ischemic colitis ([place])   . Atrial fibrillation with RVR ([place]) 02/11/2015  . GI bleed 02/11/2015  . Abdominal distention 02/11/2015  . Noninfectious gastroenteritis and colitis 02/11/2015  . Diabetes mellitus type 2 in nonobese ([place]) 02/11/2015  . Acute encephalopathy 02/11/2015  . Dysphagia, unspecified(787.20) 01/13/2014  . Rectal bleeding 01/13/2014    Past Surgical History:  Procedure Laterality Date  . BIOPSY  01/28/2014   Procedure: BIOPSY (Gastric);  Surgeon: Danie Binder, MD;  Location: [facility];  Service: Endoscopy;;  . COLONOSCOPY WITH PROPOFOL N/A 01/28/2014   YHC:WCBJSE mucosa in the terminal ileum/small internal and external hemorrhoids  . ESOPHAGOGASTRODUODENOSCOPY (EGD) WITH PROPOFOL N/A 01/28/2014   GBT:DVVOHY esophagitis/stricture at the gastro junction/moderate erosive  . 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 POLYPECTOMY  01/28/2014   Procedure: POLYPECTOMY (Rectal);  Surgeon: Danie Binder, MD;  Location: [facility];  Service: Endoscopy;;  . SAVORY DILATION N/A 01/28/2014   Procedure: SAVORY DILATION  (12.75m to 184m;  Surgeon: SaDanie BinderMD;  Location: [facility];  Service: Endoscopy;  Laterality: N/A;     OB History   None      Home Medications    Prior to Admission medications   Medication Sig Start Date End Date Taking? Authorizing Provider  acetaminophen  (TYLENOL) 325 MG tablet Take 650 mg by mouth every 6 (six) hours as needed for moderate pain.   Yes [provider]  amiodarone (PACERONE) 200 MG tablet Take 1 tablet (200 mg total) by mouth 2 (two) times daily. 02/20/15  Yes Rama, ChVenetia MaxonMD  atorvastatin (LIPITOR) 40 MG tablet Take 40 mg by mouth daily.   Yes [provider]  benztropine (COGENTIN) 2 MG tablet Take 2 mg by mouth 2 (two) times daily.  01/08/14  Yes [provider]  cetirizine (ZYRTEC) 10 MG tablet Take 10 mg by mouth daily.   Yes [provider]  diazepam (VALIUM) 10 MG tablet Take 1 tablet (10 mg total) by mouth daily. Patient taking differently: Take 10 mg by mouth 2 (two) times daily as needed for anxiety.  02/20/15  Yes Rama, ChVenetia MaxonMD  glimepiride (AMARYL) 4 MG tablet Take 4 mg by mouth 2 (two) times daily.  01/08/14  Yes [provider]  guaiFENesin-dextromethorphan (ROBITUSSIN DM) 100-10 MG/5ML syrup Take 10 mLs by mouth every 4 (four) hours as needed for cough.   Yes [provider]  insulin aspart (NOVOLOG) 100 UNIT/ML injection Inject 10 Units into the skin 4 (four) times daily. Give 8 units QID as long as Blood Sugar is above 150.    Yes [provider]  insulin glargine (LANTUS) 100 UNIT/ML injection Inject 20 Units into the skin at bedtime.   Yes [provider]  lurasidone (LATUDA) 80 MG TABS tablet Take 80 mg by mouth 2 (two) times daily.   Yes [provider]  metoprolol succinate (TOPROL-XL) 100 MG 24 hr tablet Take 1 tablet (100 mg total) by mouth 2 (two) times daily before a meal. Take with or immediately following a meal. 02/20/15  Yes Rama, ChVenetia MaxonMD  Omega-3 Fatty Acids (OMEGA-3 FISH OIL PO) Take 1,000 mg by mouth 2 (two) times daily.    Yes [provider]  omeprazole (PRILOSEC) 20 MG capsule 1 po bid 30 minutes before meals for 3 mos then once daily FOREVER Patient taking differently: Take 20 mg by mouth 2  (two) times daily before a meal.  01/28/14  Yes Fields, Sandi L, MD  QUEtiapine (SEROQUEL) 200 MG tablet Take 3 tablets (600 mg total) by mouth at bedtime. Patient taking differently: Take 400-600 mg by mouth 2 (two) times daily. 600 MG IN THE MORNING AND 400 MG IN EVENING 02/20/15  Yes Rama, ChVenetia MaxonMD  sodium chloride 1 g tablet Take 1 g by mouth 3 (three) times daily with meals.   Yes [provider]  warfarin (COUMADIN) 2 MG tablet Take 2 mg by mouth daily. Takes 2 mg daily Monday through Friday   Yes [provider]  warfarin (COUMADIN) 3 MG tablet Take 3 mg by mouth daily. Patient takes 3 mg on Saturday and Sunday   Yes [provider]  acetaminophen-codeine (TYLENOL #3)  300-30 MG tablet Take 1 tablet by mouth every 4 (four) hours as needed for moderate pain. Patient not taking: Reported on 01/27/2018 08/13/15   Carole Civil, MD  carbamazepine (TEGRETOL) 200 MG tablet Take 400 mg by mouth 2 (two) times daily.  01/08/14   [provider]  clotrimazole-betamethasone (LOTRISONE) cream Apply 1 application topically daily as needed (dry skin and rashes).  11/25/13   [provider]  CRESTOR 20 MG tablet Take 20 mg by mouth daily.  01/08/14   [provider]  fenofibrate (TRICOR) 145 MG tablet Take 145 mg by mouth daily.  01/08/14   [provider]  glyBURIDE (DIABETA) 5 MG tablet Take 2.5 mg by mouth 2 (two) times daily with a meal.  01/08/14   [provider]  haloperidol (HALDOL) 10 MG tablet Take 1 tablet (10 mg total) by mouth 2 (two) times daily. Patient not taking: Reported on 01/27/2018 02/20/15   Rama, Venetia Maxon, MD  HYDROcodone-acetaminophen (NORCO/VICODIN) 5-325 MG tablet Take 1 tablet by mouth every 4 (four) hours as needed. Patient not taking: Reported on 01/27/2018 07/30/15   Carole Civil, MD  JANTOVEN 2.5 MG tablet Take 5 mg by mouth daily. On sat and sun 2.5 mg on mon,tues,wed, thurs, fri 07/17/15    [provider]  LORazepam (ATIVAN) 1 MG tablet Take 1 tablet (1 mg total) by mouth 2 (two) times daily as needed for anxiety (anxiety). Patient not taking: Reported on 01/27/2018 02/20/15   Rama, Venetia Maxon, MD  metFORMIN (GLUCOPHAGE) 1000 MG tablet Take 1,000 mg by mouth 2 (two) times daily with a meal.    [provider]  ondansetron (ZOFRAN) 4 MG tablet Take 1 tablet (4 mg total) by mouth every 6 (six) hours as needed for nausea. Patient not taking: Reported on 01/27/2018 02/20/15   Rama, Venetia Maxon, MD  sucralfate (CARAFATE) 1 G tablet Take 1 g by mouth 2 (two) times daily.  07/02/15   [provider]  traZODone (DESYREL) 50 MG tablet Take 1 tablet (50 mg total) by mouth at bedtime as needed for sleep. Patient not taking: Reported on 01/27/2018 02/20/15   Rama, Venetia Maxon, MD  warfarin (COUMADIN) 5 MG tablet Take 1 tablet (5 mg total) by mouth daily. Patient not taking: Reported on 01/27/2018 02/20/15   Rama, Venetia Maxon, MD    Family History Family History  Problem Relation Age of Onset  . Colon cancer Unknown        unknown    Social History Social History   Tobacco Use  . Smoking status: Never Smoker  . Smokeless tobacco: Never Used  Substance Use Topics  . Alcohol use: No    Comment: history of ETOH abuse  . Drug use: No     Allergies   Sulfa antibiotics   Review of Systems Review of Systems  Unable to perform ROS: Mental status change     Physical Exam Updated Vital Signs BP (!) 113/54   Pulse 62   Temp (!) 100.9 F (38.3 C)   Resp (!) 28   Wt 78.5 kg (173 lb)   LMP 01/21/2014   SpO2 100%   BMI 30.65 kg/m   Physical Exam  Constitutional: She appears well-developed. She appears ill (Cyanotic).  Overweight  HENT:  Head: Normocephalic and atraumatic.  Dry oral mucous membranes, no oral lesions appreciated.  Eyes: Pupils are equal, round, and reactive to light. Conjunctivae and EOM are normal.  Neck: Normal range of motion  and  phonation normal. Neck supple.  Cardiovascular: Normal rate and regular rhythm.  Pulmonary/Chest: Accessory muscle usage present. No stridor. Tachypnea noted. She is in respiratory distress. She has decreased breath sounds in the right upper field and the left upper field. She has no wheezes. She has no rhonchi. She exhibits no tenderness.  Abdominal: Soft. She exhibits no distension. There is no tenderness. There is no guarding.  Genitourinary:  Genitourinary Comments: Rectal examination-normal anus without external lesions.  Small amount of firm brown stool in rectum, sent for guaiac testing.  Musculoskeletal: Normal range of motion.  Neurological: She is alert. She exhibits normal muscle tone.  Responsive, mumbles, unintelligible words.  Bilateral hand grip present.  Skin: Skin is warm and dry.  Psychiatric:  Lethargic  Nursing note and vitals reviewed.    ED Treatments / Results  Labs (all labs ordered are listed, but only abnormal results are displayed) Labs Reviewed  CBC WITH DIFFERENTIAL/PLATELET - Abnormal; Notable for the following components:      Result Value   RBC 1.12 (*)    Hemoglobin 3.5 (*)    HCT 10.6 (*)    Platelets 63 (*)    Lymphs Abs 0.3 (*)    All other components within normal limits  BLOOD GAS, ARTERIAL - Abnormal; Notable for the following components:   pH, Arterial 7.319 (*)    pO2, Arterial 70.4 (*)    Acid-base deficit 2.9 (*)    All other components within normal limits  URINALYSIS, ROUTINE W REFLEX MICROSCOPIC - Abnormal; Notable for the following components:   Glucose, UA >=500 (*)    All other components within normal limits  PROTIME-INR - Abnormal; Notable for the following components:   Prothrombin Time 21.2 (*)    All other components within normal limits  COMPREHENSIVE METABOLIC PANEL - Abnormal; Notable for the following components:   Sodium 129 (*)    Chloride 96 (*)    CO2 20 (*)    Glucose, Bld 261 (*)    Calcium 7.6 (*)    Albumin  2.9 (*)    AST 61 (*)    ALT 59 (*)    Total Bilirubin 1.4 (*)    All other components within normal limits  CBC WITH DIFFERENTIAL/PLATELET - Abnormal; Notable for the following components:   WBC 12.1 (*)    RBC 3.33 (*)    Hemoglobin 10.5 (*)    HCT 31.3 (*)    Neutro Abs 10.6 (*)    All other components within normal limits  LACTIC ACID, PLASMA - Abnormal; Notable for the following components:   Lactic Acid, Venous 4.5 (*)    All other components within normal limits  GLUCOSE, CAPILLARY - Abnormal; Notable for the following components:   Glucose-Capillary 212 (*)    All other components within normal limits  I-STAT CHEM 8, ED - Abnormal; Notable for the following components:   Sodium 128 (*)    Chloride 95 (*)    Glucose, Bld 257 (*)    Calcium, Ion 0.93 (*)    Hemoglobin 11.6 (*)    HCT 34.0 (*)    All other components within normal limits  POC OCCULT BLOOD, ED - Abnormal; Notable for the following components:   Fecal Occult Bld POSITIVE (*)    All other components within normal limits  I-STAT CG4 LACTIC ACID, ED - Abnormal; Notable for the following components:   Lactic Acid, Venous 3.45 (*)    All other components within normal limits  POCT I-STAT 3, ART BLOOD GAS (G3+) - Abnormal; Notable for the following components:   pH, Arterial 7.292 (*)    pCO2 arterial 51.6 (*)    pO2, Arterial 124.0 (*)    All other components within normal limits  CULTURE, BLOOD (ROUTINE X 2)  CULTURE, BLOOD (ROUTINE X 2)  MRSA PCR SCREENING  RESPIRATORY PANEL BY PCR  INFLUENZA PANEL BY PCR (TYPE A & B)  TROPONIN I  STREP PNEUMONIAE URINARY ANTIGEN  LEGIONELLA PNEUMOPHILA SEROGP 1 UR AG  URINALYSIS, ROUTINE W REFLEX MICROSCOPIC  BLOOD GAS, ARTERIAL  DRUG PROFILE, UR, 9 DRUGS ([facility])  HEMOGLOBIN A1C  I-STAT TROPONIN, ED  I-STAT CG4 LACTIC ACID, ED  TYPE AND SCREEN  PREPARE RBC (CROSSMATCH)  ABO/RH    EKG EKG Interpretation  Date/Time:  Saturday January 27 2018 07:30:25  EDT Ventricular Rate:  79 PR Interval:    QRS Duration: 127 QT Interval:  431 QTC Calculation: 495 R Axis:   84 Text Interpretation:  Sinus rhythm Nonspecific intraventricular conduction delay Since last tracing now in atrial fibrillation Confirmed by Daleen Bo [phone]) on 01/27/2018 8:01:55 AM   Radiology Dg Chest Portable 1 View  Result Date: 01/27/2018 CLINICAL DATA:  evaluate central line placement. EXAM: PORTABLE CHEST 1 VIEW COMPARISON:  None FINDINGS: Interval placement of left subclavian catheter. The catheter enters left IJ vein and courses cranially. The tip is not visualized. Enteric tube tip is below the level of the GE junction. Diffuse bilateral pulmonary opacities are identified. Not changed from previous exam. IMPRESSION: 1. Malpositioned left subclavian catheter which enters the left IJ vein and extends cranially. 2. No change in aeration to the lungs compared with previous exam. Electronically Signed   By: Kerby Moors M.D.   On: 01/27/2018 14:44   Dg Chest Port 1 View  Result Date: 01/27/2018 CLINICAL DATA:  Intubation. EXAM: PORTABLE CHEST 1 VIEW COMPARISON:  01/20/2016 FINDINGS: Endotracheal tube tip 16 mm above the carina. An orogastric tube reaches the stomach at least. Low lung volumes with extensive bilateral airspace opacity. No visible pneumothorax. Heart size and mediastinal contours are obscured. Partially visualized right humeral head which shows possible dislocation and fragmentation. There was a displaced fracture by 2016 radiography. Remote left rib fracture. IMPRESSION: 1. Endotracheal tube tip 16 mm above the carina. An orogastric tube reaches the stomach. 2. Extensive airspace disease which could reflect pneumonia, cardiogenic edema, and/or ARDS. Electronically Signed   By: Monte Fantasia M.D.   On: 01/27/2018 08:15    Procedures .Critical Care Performed by: Daleen Bo, MD Authorized by: Daleen Bo, MD   Critical care provider statement:     Critical care time (minutes):  90   Critical care start time:  01/27/2018 7:25 AM   Critical care end time:  01/27/2018 10:03 AM   Critical care time was exclusive of:  Separately billable procedures and treating other patients   Critical care was necessary to treat or prevent imminent or life-threatening deterioration of the following conditions:  Respiratory failure   Critical care was time spent personally by me on the following activities:  Blood draw for specimens, development of treatment plan with patient or surrogate, discussions with consultants, evaluation of patient's response to treatment, examination of patient, obtaining history from patient or surrogate, ordering and performing treatments and interventions, ordering and review of laboratory studies, pulse oximetry, re-evaluation of patient's condition, review of old charts and ordering and review of radiographic studies   (including critical care time)  Medications Ordered in  ED Medications  propofol (DIPRIVAN) 1000 MG/100ML infusion (  Not Given 01/27/18 1405)  midazolam (VERSED) 50 mg in sodium chloride 0.9 % 50 mL (1 mg/mL) infusion (2 mg/hr Intravenous Rate/Dose Change 01/27/18 1056)  fentaNYL (SUBLIMAZE) 2,500 mcg in sodium chloride 0.9 % 250 mL (10 mcg/mL) infusion (100 mcg/hr Intravenous Rate/Dose Change 01/27/18 1102)  0.9 %  sodium chloride infusion (has no administration in time range)  heparin injection 5,000 Units (has no administration in time range)  pantoprazole (PROTONIX) injection 40 mg (has no administration in time range)  lactated ringers infusion (has no administration in time range)  norepinephrine (LEVOPHED) 4 mg in dextrose 5 % 250 mL (0.016 mg/mL) infusion (0 mcg/min Intravenous Hold 01/27/18 1405)  azithromycin (ZITHROMAX) 500 mg in sodium chloride 0.9 % 250 mL IVPB (has no administration in time range)  piperacillin-tazobactam (ZOSYN) IVPB 3.375 g (has no administration in time range)  vancomycin (VANCOCIN)  IVPB 750 mg/150 ml premix (has no administration in time range)  lactated ringers bolus 1,000 mL (has no administration in time range)  insulin glargine (LANTUS) injection 10 Units (has no administration in time range)  insulin aspart (novoLOG) injection 0-15 Units (has no administration in time range)  chlorhexidine gluconate (MEDLINE KIT) (PERIDEX) 0.12 % solution 15 mL (has no administration in time range)  MEDLINE mouth rinse (has no administration in time range)  levETIRAcetam (KEPPRA) IVPB 1000 mg/100 mL premix (has no administration in time range)  acetaminophen (TYLENOL) tablet 325 mg (has no administration in time range)  etomidate (AMIDATE) injection 20 mg (20 mg Intravenous Given 01/27/18 0740)  succinylcholine (ANECTINE) injection 125 mg (125 mg Intravenous Given 01/27/18 0741)  LORazepam (ATIVAN) injection 2 mg ( Intravenous Not Given 01/27/18 1404)  fentaNYL (SUBLIMAZE) injection 100 mcg ( Intravenous Canceled Entry 01/27/18 1404)  albuterol (PROVENTIL,VENTOLIN) solution continuous neb (10 mg/hr Nebulization Given 01/27/18 0816)  propofol (DIPRIVAN) 1000 MG/100ML infusion (0 mcg/kg/min  78.5 kg Intravenous Stopped 01/27/18 1042)  propofol (DIPRIVAN) 10 mg/mL bolus/IV push 19.6 mg (19.6 mg Intravenous Bolus 01/27/18 0811)  propofol (DIPRIVAN) 10 mg/mL bolus/IV push 50 mg (50 mg Intravenous Bolus 01/27/18 0815)  0.9 %  sodium chloride infusion ( Intravenous New Bag/Given 01/27/18 0831)  LORazepam (ATIVAN) injection 2 mg ( Intravenous Not Given 01/27/18 1404)  LORazepam (ATIVAN) injection 2 mg (2 mg Intravenous Given 01/27/18 0916)  piperacillin-tazobactam (ZOSYN) IVPB 3.375 g (0 g Intravenous Stopped 01/27/18 1013)  vancomycin (VANCOCIN) IVPB 1000 mg/200 mL premix (0 mg Intravenous Stopped 01/27/18 1117)  LORazepam (ATIVAN) injection 2 mg (2 mg Intravenous Given 01/27/18 1035)     Initial Impression / Assessment and Plan / ED Course  I have reviewed the triage vital signs and the nursing  notes.  Pertinent labs & imaging results that were available during my care of the patient were reviewed by me and considered in my medical decision making (see chart for details).  Clinical Course as of Jan 28 1555  Sat Jan 27, 2018  0831 Patient required emergent intubation for cyanosis, and oxygen saturation mid 60s on 100% facemask oxygen.  Following intubation and she required sedation with propofol.  Ventilation management per me with adjustment of respiratory rate, and oxygenation.  GI bleeding noted, with anemia, and packed red blood cell infusion ordered.  Suspect Coumadin toxicity, vitamin K ordered.  Patient in sinus rhythm.  Patient is very L will require multispecialty management and likely transfer to the [facility].   [EW]  [phone] Patient afebrile with recent URI, differential  diagnosis includes pneumonia, sepsis, influenza, heart failure, COPD exacerbation.   [EW]  [phone] Type and screen [facility] [EW]  [phone] Repeat labs to follow-up on discrepancy between i-STAT 8 and serum hemoglobin, returned with serum hemoglobin 10    [EW]  1010 CBC with Differential [EW]  1014 CBC with Differential [EW]  1025 Case discussed with the intensivist who accepts the patient.  Will arrange for transfer to [facility], after bed assignment.   [EW]    Clinical Course User Index [EW] Daleen Bo, MD     Patient Vitals for the past 24 hrs:  BP Temp Pulse Resp SpO2 Weight  01/27/18 1540 (!) 113/54 - - (!) 28 100 % -  01/27/18 1500 (!) 113/54 (!) 100.9 F (38.3 C) 62 (!) 24 100 % -  01/27/18 1400 129/69 (!) 101.1 F (38.4 C) 63 20 100 % -  01/27/18 1215 (!) 119/58 100.2 F (37.9 C) - - - -  01/27/18 1200 (!) 113/59 100 F (37.8 C) 61 (!) 23 99 % -  01/27/18 1145 (!) 116/59 100 F (37.8 C) 61 (!) 26 99 % -  01/27/18 1130 (!) 121/55 99.9 F (37.7 C) 61 (!) 24 97 % -  01/27/18 1115 (!) 114/57 99.9 F (37.7 C) 62 (!) 22 98 % -  01/27/18 1100 (!) 119/59 99.7 F (37.6 C) 62  (!) 27 - -  01/27/18 1045 (!) 85/46 99.7 F (37.6 C) 62 (!) 28 98 % -  01/27/18 1030 (!) 94/58 99.5 F (37.5 C) 64 - - -  01/27/18 1027 - - - - 97 % -  01/27/18 1015 (!) 92/49 99.7 F (37.6 C) 66 (!) 23 100 % -  01/27/18 1000 (!) 102/53 99.7 F (37.6 C) 66 (!) 27 99 % -  01/27/18 0945 (!) 99/53 99.7 F (37.6 C) 70 (!) 23 98 % -  01/27/18 0943 (!) 97/51 99.7 F (37.6 C) 70 (!) 23 99 % -  01/27/18 0930 (!) 90/51 99.9 F (37.7 C) 72 (!) 30 100 % -  01/27/18 0915 (!) 93/47 100 F (37.8 C) 75 (!) 24 100 % -  01/27/18 0912 (!) 84/43 100 F (37.8 C) - (!) 22 - -  01/27/18 0900 93/61 100 F (37.8 C) 73 (!) 21 100 % -  01/27/18 0856 (!) 102/50 100 F (37.8 C) 72 (!) 25 100 % -  01/27/18 0845 100/63 100 F (37.8 C) 69 17 97 % -  01/27/18 0837 (!) 117/95 100 F (37.8 C) 71 (!) 38 100 % -  01/27/18 0830 (!) 125/106 100 F (37.8 C) 69 (!) 25 93 % -  01/27/18 0816 (!) 108/56 99.9 F (37.7 C) 70 (!) 24 93 % -  01/27/18 0815 (!) 136/59 99.9 F (37.7 C) 71 (!) 25 94 % -  01/27/18 0812 (!) 142/76 99.7 F (37.6 C) - (!) 28 - -  01/27/18 0811 - 99.7 F (37.6 C) - (!) 30 - -  01/27/18 0803 - - - - - 78.5 kg (173 lb)  01/27/18 0745 (!) 146/69 - 70 (!) 24 97 % -  01/27/18 0732 (!) 146/69 - 77 (!) 28 (!) 29 % -       Medical decision making-acute respiratory distress with abnormal chest x-ray, nonspecific appearance.  Differential diagnosis includes CHF, COPD exacerbation, pneumonia, ARDS.  Patient treated with supportive therapy including intubation, sedation on ventilator, blood transfusion, and empiric antibiotics.  Differential diagnosis including CHF as source for respiratory distress,  with respiratory failure, prevents dosing with high volume saline at this time.  She will lactate elevated has been noted.  Patient will benefit from intravascular monitoring of blood pressure and possibly central venous pressure monitoring to determine appropriate vascular status and guide fluid  resuscitation.  initial hemoglobin was very low, but likely was diluted from adjacent I the site with infusion of saline.  Hemoglobin 10.  Patient required adjustment of sedation, to control vent tolerance.  Change from propofol to Versed and fentanyl infusions for sedation.   Nursing Notes Reviewed/ Care Coordinated Applicable Imaging Reviewed Interpretation of Laboratory Data incorporated into ED treatment      Plan: Admit to ICU.   Final Clinical Impressions(s) / ED Diagnoses   Final diagnoses:  Acute respiratory failure with hypoxia (HCC)  Anemia, unspecified type  Hypoxia    ED Discharge Orders    None       Daleen Bo, MD 01/27/18 1557    Daleen Bo, MD 01/27/18 [phone]

## 2018-01-27 NOTE — Progress Notes (Signed)
Pharmacy Antibiotic Note  Natasha Martin is a 57 y.o. female admitted on 01/27/2018 with pneumonia.  Pharmacy has been consulted for Vancomycin and Zosyn dosing. Patient received a [facility] and [facility]. CrCl ~ 80 mL/min. WBC 12.5. LA 3.45>4.5.   Plan: -Zosyn 3.375 gm IV Q 8 hours (EI infusion) -Vancomycin 750 mg IV Q 8 hours -Monitor CBC, renal fx, cultures and clinical progress -VT at SS   Weight: 173 lb (78.5 kg)  Temp (24hrs), Avg:99.9 F (37.7 C), Min:99.5 F (37.5 C), Max:100.2 F (37.9 C)  Recent Labs  Lab 01/27/18 0747 01/27/18 0828 01/27/18 0854 01/27/18 0855 01/27/18 0904 01/27/18 1050  WBC 4.5  --   --  12.1*  --   --   CREATININE  --   --  0.73  --  0.80  --   LATICACIDVEN  --  1.06  --   --  3.45* 4.5*    CrCl cannot be calculated (Unknown ideal weight.).    Allergies  Allergen Reactions  . Sulfa Antibiotics Rash    Antimicrobials this admission: Vanc 3/30 >>  Zosyn 3/30 >>   Dose adjustments this admission: None  Microbiology results: 3/30 BCx:   Thank you for allowing pharmacy to be a part of this patient's care.  Vinnie LevelBenjamin Becka Lagasse, PharmD., BCPS Clinical Pharmacist Clinical phone for 01/27/18 until 3:30pm: [phone] If after 3:30pm, please call main pharmacy at: [phone]

## 2018-01-27 NOTE — ED Triage Notes (Signed)
Patient brought in by EMS with complaint of shortness of breath. Per EMS, patient speech slurred. States last known normal was 2000 yesterday. CBG 337. Patient on 2L O2 via nasal canula upon arrival with O2 saturation  24.

## 2018-01-28 ENCOUNTER — Inpatient Hospital Stay (HOSPITAL_COMMUNITY): Payer: Medicare Other

## 2018-01-28 DIAGNOSIS — I5030 Unspecified diastolic (congestive) heart failure: Secondary | ICD-10-CM

## 2018-01-28 DIAGNOSIS — J80 Acute respiratory distress syndrome: Secondary | ICD-10-CM

## 2018-01-28 LAB — POCT I-STAT 3, ART BLOOD GAS (G3+)
Acid-base deficit: 3 mmol/L — ABNORMAL HIGH (ref 0.0–2.0)
Acid-base deficit: 3 mmol/L — ABNORMAL HIGH (ref 0.0–2.0)
Acid-base deficit: 1 mmol/L (ref 0.0–2.0)
Bicarbonate: 22.1 mmol/L (ref 20.0–28.0)
Bicarbonate: 23.7 mmol/L (ref 20.0–28.0)
Bicarbonate: 23.6 mmol/L (ref 20.0–28.0)
Bicarbonate: 24.5 mmol/L (ref 20.0–28.0)
O2 SAT: 95 %
O2 SAT: 98 %
O2 Saturation: 98 %
O2 Saturation: 98 %
PCO2 ART: 41.6 mmHg (ref 32.0–48.0)
PCO2 ART: 49.7 mmHg — AB (ref 32.0–48.0)
PCO2 ART: 36.6 mmHg (ref 32.0–48.0)
PCO2 ART: 41.5 mmHg (ref 32.0–48.0)
PH ART: 7.335 — AB (ref 7.350–7.450)
PH ART: 7.292 — AB (ref 7.350–7.450)
PO2 ART: 119 mmHg — AB (ref 83.0–108.0)
PO2 ART: 91 mmHg (ref 83.0–108.0)
PO2 ART: 107 mmHg (ref 83.0–108.0)
PO2 ART: 115 mmHg — AB (ref 83.0–108.0)
Patient temperature: 37.3
Patient temperature: 38
Patient temperature: 37.6
Patient temperature: 100.2
TCO2: 23 mmol/L (ref 22–32)
TCO2: 25 mmol/L (ref 22–32)
TCO2: 25 mmol/L (ref 22–32)
TCO2: 26 mmol/L (ref 22–32)
pH, Arterial: 7.42 (ref 7.350–7.450)
pH, Arterial: 7.383 (ref 7.350–7.450)

## 2018-01-28 LAB — CBC WITH DIFFERENTIAL/PLATELET
BASOS ABS: 0 10*3/uL (ref 0.0–0.1)
BASOS PCT: 0 %
EOS ABS: 0.2 10*3/uL (ref 0.0–0.7)
EOS PCT: 2 %
HCT: 26.7 % — ABNORMAL LOW (ref 36.0–46.0)
Hemoglobin: 8.7 g/dL — ABNORMAL LOW (ref 12.0–15.0)
LYMPHS PCT: 9 %
Lymphs Abs: 1 10*3/uL (ref 0.7–4.0)
MCH: 30.9 pg (ref 26.0–34.0)
MCHC: 32.6 g/dL (ref 30.0–36.0)
MCV: 94.7 fL (ref 78.0–100.0)
MONO ABS: 0.9 10*3/uL (ref 0.1–1.0)
Monocytes Relative: 8 %
Neutro Abs: 8.8 10*3/uL — ABNORMAL HIGH (ref 1.7–7.7)
Neutrophils Relative %: 81 %
PLATELETS: 176 10*3/uL (ref 150–400)
RBC: 2.82 MIL/uL — ABNORMAL LOW (ref 3.87–5.11)
RDW: 14.8 % (ref 11.5–15.5)
WBC: 10.9 10*3/uL — ABNORMAL HIGH (ref 4.0–10.5)

## 2018-01-28 LAB — GLUCOSE, CAPILLARY
Glucose-Capillary: 102 mg/dL — ABNORMAL HIGH (ref 65–99)
Glucose-Capillary: 103 mg/dL — ABNORMAL HIGH (ref 65–99)
Glucose-Capillary: 121 mg/dL — ABNORMAL HIGH (ref 65–99)
Glucose-Capillary: 122 mg/dL — ABNORMAL HIGH (ref 65–99)
Glucose-Capillary: 153 mg/dL — ABNORMAL HIGH (ref 65–99)
Glucose-Capillary: 85 mg/dL (ref 65–99)
Glucose-Capillary: 90 mg/dL (ref 65–99)

## 2018-01-28 LAB — HEPATIC FUNCTION PANEL
ALT: 84 U/L — ABNORMAL HIGH (ref 14–54)
AST: 82 U/L — AB (ref 15–41)
Albumin: 1.9 g/dL — ABNORMAL LOW (ref 3.5–5.0)
Alkaline Phosphatase: 108 U/L (ref 38–126)
BILIRUBIN DIRECT: 1 mg/dL — AB (ref 0.1–0.5)
Indirect Bilirubin: 0.7 mg/dL (ref 0.3–0.9)
Total Bilirubin: 1.7 mg/dL — ABNORMAL HIGH (ref 0.3–1.2)
Total Protein: 4.8 g/dL — ABNORMAL LOW (ref 6.5–8.1)

## 2018-01-28 LAB — ECHOCARDIOGRAM COMPLETE
Height: 62 in
Weight: 2864.22 oz

## 2018-01-28 LAB — LACTIC ACID, PLASMA: Lactic Acid, Venous: 0.8 mmol/L (ref 0.5–1.9)

## 2018-01-28 LAB — BASIC METABOLIC PANEL
Anion gap: 5 (ref 5–15)
BUN: 6 mg/dL (ref 6–20)
CALCIUM: 6.9 mg/dL — AB (ref 8.9–10.3)
CHLORIDE: 100 mmol/L — AB (ref 101–111)
CO2: 24 mmol/L (ref 22–32)
CREATININE: 0.73 mg/dL (ref 0.44–1.00)
GFR calc non Af Amer: >60 mL/min (ref >60)
Glucose, Bld: 124 mg/dL — ABNORMAL HIGH (ref 65–99)
Potassium: 3.8 mmol/L (ref 3.5–5.1)
SODIUM: 129 mmol/L — AB (ref 135–145)

## 2018-01-28 LAB — TROPONIN I

## 2018-01-28 LAB — HEPARIN LEVEL (UNFRACTIONATED): Heparin Unfractionated: <0.1 IU/mL — ABNORMAL LOW (ref 0.30–0.70)

## 2018-01-28 LAB — PROTIME-INR
INR: 2.15
PROTHROMBIN TIME: 23.8 s — AB (ref 11.4–15.2)

## 2018-01-28 LAB — PHOSPHORUS
Phosphorus: 2.3 mg/dL — ABNORMAL LOW (ref 2.5–4.6)
Phosphorus: 2.2 mg/dL — ABNORMAL LOW (ref 2.5–4.6)

## 2018-01-28 LAB — MAGNESIUM
MAGNESIUM: 1.5 mg/dL — AB (ref 1.7–2.4)
Magnesium: 2.3 mg/dL (ref 1.7–2.4)

## 2018-01-28 LAB — PROCALCITONIN: PROCALCITONIN: 0.4 ng/mL

## 2018-01-28 MED ORDER — PERFLUTREN LIPID MICROSPHERE
1.0000 mL | INTRAVENOUS | Status: AC | PRN
  Administered 2018-01-28: 2 mL via INTRAVENOUS
  Filled 2018-01-28: qty 10

## 2018-01-28 MED ORDER — FENTANYL CITRATE (PF) 100 MCG/2ML IJ SOLN: 100.0000 ug | Freq: Once | INTRAMUSCULAR | Status: DC

## 2018-01-28 MED ORDER — HEPARIN (PORCINE) IN NACL 100-0.45 UNIT/ML-% IJ SOLN
900.0000 [IU]/h | INTRAMUSCULAR | Status: DC
  Administered 2018-01-28: 900 [IU]/h via INTRAVENOUS
  Filled 2018-01-28 (×2): qty 250

## 2018-01-28 MED ORDER — FENTANYL 2500MCG IN NS 250ML (10MCG/ML) PREMIX INFUSION
0.0000 ug/h | INTRAVENOUS | Status: DC
  Administered 2018-01-28: 300 ug/h via INTRAVENOUS
  Administered 2018-01-28 (×2): 350 ug/h via INTRAVENOUS
  Administered 2018-01-28: 400 ug/h via INTRAVENOUS
  Administered 2018-01-29 (×2): 350 ug/h via INTRAVENOUS
  Administered 2018-01-30: 50 ug/h via INTRAVENOUS
  Administered 2018-01-30: 300 ug/h via INTRAVENOUS
  Administered 2018-01-30: 400 ug/h via INTRAVENOUS
  Administered 2018-01-30: 50 ug/h via INTRAVENOUS
  Administered 2018-01-30: 400 ug/h via INTRAVENOUS
  Administered 2018-01-31: 300 ug/h via INTRAVENOUS
  Administered 2018-01-31: 400 ug/h via INTRAVENOUS
  Administered 2018-01-31 – 2018-02-01 (×2): 300 ug/h via INTRAVENOUS
  Administered 2018-02-01: 200 ug/h via INTRAVENOUS
  Administered 2018-02-01: 300 ug/h via INTRAVENOUS
  Administered 2018-02-02: 200 ug/h via INTRAVENOUS
  Administered 2018-02-02: 25 ug/h via INTRAVENOUS
  Administered 2018-02-03: 150 ug/h via INTRAVENOUS
  Administered 2018-02-04: 25 ug/h via INTRAVENOUS
  Filled 2018-01-28 (×21): qty 250

## 2018-01-28 MED ORDER — PANTOPRAZOLE SODIUM 40 MG PO PACK
40.0000 mg | PACK | Freq: Every day | ORAL | Status: DC
  Administered 2018-01-28 – 2018-02-05 (×9): 40 mg
  Filled 2018-01-28 (×10): qty 20

## 2018-01-28 MED ORDER — CISATRACURIUM BOLUS VIA INFUSION
0.0500 mg/kg | Freq: Once | INTRAVENOUS | Status: AC
  Administered 2018-01-28: 4.1 mg via INTRAVENOUS
  Filled 2018-01-28: qty 5

## 2018-01-28 MED ORDER — PRO-STAT SUGAR FREE PO LIQD
30.0000 mL | Freq: Two times a day (BID) | ORAL | Status: DC
  Administered 2018-01-28 – 2018-01-29 (×3): 30 mL
  Filled 2018-01-28 (×4): qty 30

## 2018-01-28 MED ORDER — ARTIFICIAL TEARS OPHTHALMIC OINT
1.0000 "application " | TOPICAL_OINTMENT | Freq: Three times a day (TID) | OPHTHALMIC | Status: DC
  Administered 2018-01-28 – 2018-01-31 (×9): 1 via OPHTHALMIC
  Filled 2018-01-28: qty 3.5

## 2018-01-28 MED ORDER — VITAL HIGH PROTEIN PO LIQD
1000.0000 mL | ORAL | Status: DC
  Administered 2018-01-28: 1000 mL
  Filled 2018-01-28: qty 1000

## 2018-01-28 MED ORDER — MIDAZOLAM BOLUS VIA INFUSION
2.0000 mg | INTRAVENOUS | Status: DC | PRN
  Administered 2018-01-29 – 2018-02-02 (×7): 2 mg via INTRAVENOUS
  Filled 2018-01-28: qty 2

## 2018-01-28 MED ORDER — MAGNESIUM SULFATE 2 GM/50ML IV SOLN
2.0000 g | Freq: Once | INTRAVENOUS | Status: AC
  Administered 2018-01-28: 2 g via INTRAVENOUS
  Filled 2018-01-28: qty 50

## 2018-01-28 MED ORDER — SODIUM GLYCEROPHOSPHATE 1 MMOLE/ML IV SOLN
20.0000 mmol | Freq: Once | INTRAVENOUS | Status: AC
  Administered 2018-01-28: 20 mmol via INTRAVENOUS
  Filled 2018-01-28: qty 20

## 2018-01-28 MED ORDER — SODIUM CHLORIDE 0.9 % IV SOLN
3.0000 ug/kg/min | INTRAVENOUS | Status: DC
  Administered 2018-01-28 (×2): 3 ug/kg/min via INTRAVENOUS
  Administered 2018-01-29: 4 ug/kg/min via INTRAVENOUS
  Administered 2018-01-30: 3 ug/kg/min via INTRAVENOUS
  Filled 2018-01-28 (×4): qty 20

## 2018-01-28 MED ORDER — FENTANYL CITRATE (PF) 100 MCG/2ML IJ SOLN: 100.0000 ug | Freq: Once | INTRAMUSCULAR | Status: DC | PRN

## 2018-01-28 MED ORDER — MIDAZOLAM HCL 2 MG/2ML IJ SOLN: 2.0000 mg | Freq: Once | INTRAMUSCULAR | Status: DC | PRN

## 2018-01-28 MED ORDER — FENTANYL 2500MCG IN NS 250ML (10MCG/ML) PREMIX INFUSION: 100.0000 ug/h | INTRAVENOUS | Status: DC

## 2018-01-28 MED ORDER — CHLORHEXIDINE GLUCONATE 0.12% ORAL RINSE (MEDLINE KIT)
15.0000 mL | Freq: Two times a day (BID) | OROMUCOSAL | Status: DC
  Administered 2018-01-28 – 2018-01-29 (×2): 15 mL via OROMUCOSAL

## 2018-01-28 MED ORDER — INSULIN ASPART 100 UNIT/ML ~~LOC~~ SOLN
2.0000 [IU] | SUBCUTANEOUS | Status: DC
  Administered 2018-01-28: 2 [IU] via SUBCUTANEOUS
  Administered 2018-01-29: 6 [IU] via SUBCUTANEOUS
  Administered 2018-01-29 (×2): 2 [IU] via SUBCUTANEOUS
  Administered 2018-01-29 – 2018-01-30 (×6): 4 [IU] via SUBCUTANEOUS
  Administered 2018-01-30: 3 [IU] via SUBCUTANEOUS
  Administered 2018-01-30: 6 [IU] via SUBCUTANEOUS
  Administered 2018-01-30 – 2018-01-31 (×3): 4 [IU] via SUBCUTANEOUS
  Administered 2018-01-31: 2 [IU] via SUBCUTANEOUS
  Administered 2018-01-31: 6 [IU] via SUBCUTANEOUS
  Administered 2018-01-31: 2 [IU] via SUBCUTANEOUS
  Administered 2018-01-31 – 2018-02-01 (×4): 4 [IU] via SUBCUTANEOUS
  Administered 2018-02-01: 2 [IU] via SUBCUTANEOUS

## 2018-01-28 MED ORDER — MIDAZOLAM HCL 2 MG/2ML IJ SOLN
2.0000 mg | Freq: Once | INTRAMUSCULAR | Status: AC
  Administered 2018-01-28: 2 mg via INTRAVENOUS

## 2018-01-28 MED ORDER — ORAL CARE MOUTH RINSE
15.0000 mL | Freq: Four times a day (QID) | OROMUCOSAL | Status: DC
  Administered 2018-01-28 – 2018-01-29 (×5): 15 mL via OROMUCOSAL

## 2018-01-28 MED ORDER — SODIUM CHLORIDE 0.9 % IV SOLN
2.0000 mg/h | INTRAVENOUS | Status: DC
  Administered 2018-01-28 – 2018-01-29 (×2): 6 mg/h via INTRAVENOUS
  Administered 2018-01-30: 10 mg/h via INTRAVENOUS
  Administered 2018-01-30: 4 mg/h via INTRAVENOUS
  Administered 2018-01-30: 10 mg/h via INTRAVENOUS
  Filled 2018-01-28 (×7): qty 10

## 2018-01-28 MED ORDER — FENTANYL BOLUS VIA INFUSION
50.0000 ug | INTRAVENOUS | Status: DC | PRN
  Administered 2018-01-29 – 2018-02-04 (×6): 50 ug via INTRAVENOUS
  Filled 2018-01-28: qty 50

## 2018-01-28 NOTE — Progress Notes (Signed)
  Echocardiogram 2D Echocardiogram has been performed.  Roosvelt MaserLane, Margarethe Virgen F 01/28/2018, 1:02 PM

## 2018-01-28 NOTE — Progress Notes (Signed)
Brief Nutrition Note  Consult received for enteral/tube feeding initiation and management.  Adult Enteral Nutrition Protocol initiated. Full assessment to follow.  Admitting Dx: Hypoxia [R09.02] Acute respiratory failure with hypoxia (HCC) [J96.01] Anemia, unspecified type [D64.9] ARDS (adult respiratory distress syndrome) (HCC) [J80]  Body mass index is 32.74 kg/m. Pt meets criteria for obesity based on current BMI.  Labs:  Recent Labs  Lab 01/27/18 0854 01/27/18 0904 01/28/18 0920  NA 129* 128* 129*  K 4.9 5.0 3.8  CL 96* 95* 100*  CO2 20*  --  24  BUN 9 7 6   CREATININE 0.73 0.80 0.73  CALCIUM 7.6*  --  6.9*  MG  --   --  1.5*  PHOS  --   --  2.3*  GLUCOSE 261* 257* 124*    Tilda FrancoLindsey Treyven Lafauci, MS, RD, LDN [facility] Clinical Dietitian Pager: [phone] After Hours Pager: [phone]

## 2018-01-28 NOTE — Progress Notes (Signed)
ANTICOAGULATION CONSULT NOTE  Pharmacy Consult for Heparin Indication: atrial fibrillation  Allergies  Allergen Reactions  . Sulfa Antibiotics Rash    Patient Measurements: Height: 5\' 2"  (157.5 cm) Weight: 179 lb 0.2 oz (81.2 kg) IBW/kg (Calculated) : 50.1 Heparin Dosing Weight: 68 kg   Vital Signs: Temp: 100 F (37.8 C) (03/31 1800) Temp Source: Bladder (03/31 1800) BP: 106/48 (03/31 1800) Pulse Rate: 58 (03/31 1800)  Labs: Recent Labs    01/27/18 0747 01/27/18 0854 01/27/18 0855 01/27/18 0904 01/28/18 0920 01/28/18 1449 01/28/18 1805  HGB 3.5*  --  10.5* 11.6* 8.7*  --   --   HCT 10.6*  --  31.3* 34.0* 26.7*  --   --   PLT 63*  --  180  --  176  --   --   LABPROT  --  21.2*  --   --  23.8*  --   --   INR  --  1.85  --   --  2.15  --   --   HEPARINUNFRC  --   --   --   --   --   --  <0.10*  CREATININE  --  0.73  --  0.80 0.73  --   --   TROPONINI  --   --   --   --  <0.03 <0.03  --     Estimated Creatinine Clearance: 79.3 mL/min (by C-G formula based on SCr of 0.73 mg/dL).   Medical History: Past Medical History:  Diagnosis Date  . Anxiety   . Diabetes (HCC)   . Dysphagia   . Hyperlipidemia   . Hypertension   . Mild mental slowing   . Poor historian   . Schizophrenia (HCC)   . Seizures (HCC)    has not had a seizure since last year. Unknown orgin- no family available to come and patient does not know.    Medications:  Medications Prior to Admission  Medication Sig Dispense Refill Last Dose  . acetaminophen (TYLENOL) 325 MG tablet Take 650 mg by mouth every 6 (six) hours as needed for moderate pain.   Past Week at Unknown time  . amiodarone (PACERONE) 200 MG tablet Take 1 tablet (200 mg total) by mouth 2 (two) times daily.   01/26/2018 at Unknown time  . atorvastatin (LIPITOR) 40 MG tablet Take 40 mg by mouth daily.   01/26/2018 at Unknown time  . benztropine (COGENTIN) 2 MG tablet Take 2 mg by mouth 2 (two) times daily.    01/26/2018 at Unknown time   . cetirizine (ZYRTEC) 10 MG tablet Take 10 mg by mouth daily.   01/26/2018 at Unknown time  . diazepam (VALIUM) 10 MG tablet Take 1 tablet (10 mg total) by mouth daily. (Patient taking differently: Take 10 mg by mouth 2 (two) times daily as needed for anxiety. ) 30 tablet 0 Past Week at Unknown time  . glimepiride (AMARYL) 4 MG tablet Take 4 mg by mouth 2 (two) times daily.    01/26/2018 at Unknown time  . guaiFENesin-dextromethorphan (ROBITUSSIN DM) 100-10 MG/5ML syrup Take 10 mLs by mouth every 4 (four) hours as needed for cough.   01/26/2018 at Unknown time  . insulin aspart (NOVOLOG) 100 UNIT/ML injection Inject 10 Units into the skin 4 (four) times daily. Give 8 units QID as long as Blood Sugar is above 150.    01/26/2018 at Unknown time  . insulin glargine (LANTUS) 100 UNIT/ML injection Inject 20 Units into the skin at  bedtime.   01/26/2018 at Unknown time  . lurasidone (LATUDA) 80 MG TABS tablet Take 80 mg by mouth 2 (two) times daily.   01/26/2018 at Unknown time  . metoprolol succinate (TOPROL-XL) 100 MG 24 hr tablet Take 1 tablet (100 mg total) by mouth 2 (two) times daily before a meal. Take with or immediately following a meal.   01/26/2018 at unknown  . Omega-3 Fatty Acids (OMEGA-3 FISH OIL PO) Take 1,000 mg by mouth 2 (two) times daily.    01/26/2018 at Unknown time  . omeprazole (PRILOSEC) 20 MG capsule 1 po bid 30 minutes before meals for 3 mos then once daily FOREVER (Patient taking differently: Take 20 mg by mouth 2 (two) times daily before a meal. ) 60 capsule 11 01/26/2018 at Unknown time  . QUEtiapine (SEROQUEL) 200 MG tablet Take 3 tablets (600 mg total) by mouth at bedtime. (Patient taking differently: Take 400-600 mg by mouth 2 (two) times daily. 600 MG IN THE MORNING AND 400 MG IN EVENING)   01/26/2018 at Unknown time  . sodium chloride 1 g tablet Take 1 g by mouth 3 (three) times daily with meals.   01/26/2018 at Unknown time  . warfarin (COUMADIN) 2 MG tablet Take 2 mg by mouth daily.  Takes 2 mg daily Monday through Friday   01/26/2018 at unknown  . warfarin (COUMADIN) 3 MG tablet Take 3 mg by mouth daily. Patient takes 3 mg on Saturday and Sunday   Past Week at Unknown time  . acetaminophen-codeine (TYLENOL #3) 300-30 MG tablet Take 1 tablet by mouth every 4 (four) hours as needed for moderate pain. (Patient not taking: Reported on 01/27/2018) 84 tablet 0 Completed Course at Unknown time  . carbamazepine (TEGRETOL) 200 MG tablet Take 400 mg by mouth 2 (two) times daily.    07/23/2015 at Unknown time  . clotrimazole-betamethasone (LOTRISONE) cream Apply 1 application topically daily as needed (dry skin and rashes).    Not Taking at Unknown time  . CRESTOR 20 MG tablet Take 20 mg by mouth daily.    07/23/2015 at Unknown time  . fenofibrate (TRICOR) 145 MG tablet Take 145 mg by mouth daily.    07/23/2015 at Unknown time  . glyBURIDE (DIABETA) 5 MG tablet Take 2.5 mg by mouth 2 (two) times daily with a meal.    07/23/2015 at Unknown time  . haloperidol (HALDOL) 10 MG tablet Take 1 tablet (10 mg total) by mouth 2 (two) times daily. (Patient not taking: Reported on 01/27/2018)   Not Taking at Unknown time  . HYDROcodone-acetaminophen (NORCO/VICODIN) 5-325 MG tablet Take 1 tablet by mouth every 4 (four) hours as needed. (Patient not taking: Reported on 01/27/2018) 42 tablet 0 Completed Course at Unknown time  . JANTOVEN 2.5 MG tablet Take 5 mg by mouth daily. On sat and sun 2.5 mg on mon,tues,wed, thurs, fri   07/23/2015 at 0800  . LORazepam (ATIVAN) 1 MG tablet Take 1 tablet (1 mg total) by mouth 2 (two) times daily as needed for anxiety (anxiety). (Patient not taking: Reported on 01/27/2018) 30 tablet 0 Not Taking at Unknown time  . metFORMIN (GLUCOPHAGE) 1000 MG tablet Take 1,000 mg by mouth 2 (two) times daily with a meal.   07/23/2015 at Unknown time  . ondansetron (ZOFRAN) 4 MG tablet Take 1 tablet (4 mg total) by mouth every 6 (six) hours as needed for nausea. (Patient not taking: Reported on  01/27/2018) 20 tablet 0 Not Taking at Unknown time  .  sucralfate (CARAFATE) 1 G tablet Take 1 g by mouth 2 (two) times daily.    07/23/2015 at Unknown time  . traZODone (DESYREL) 50 MG tablet Take 1 tablet (50 mg total) by mouth at bedtime as needed for sleep. (Patient not taking: Reported on 01/27/2018)   Not Taking at Unknown time  . warfarin (COUMADIN) 5 MG tablet Take 1 tablet (5 mg total) by mouth daily. (Patient not taking: Reported on 01/27/2018)   Not Taking at Unknown time    Assessment: 61 YOF with a history of AFib on warfarin at home found unresponsive at home. Pharmacy consulted to start IV heparin bridge. INR on admission was 1.85 and heparin was initiated.   Heparin level undetectable this evening, however, INR on recheck is now >2 at 2.15. Hg down to 8.7, plt wnl. No bleed documented. Will hold heparin for now until INR<2.  Goal of Therapy:  Heparin level 0.3-0.7 units/ml Monitor platelets by anticoagulation protocol: Yes   Plan:  Hold heparin infusion for now with INR>2, will recheck daily INR and resume when <2 Monitor daily INR and CBC, s/sx bleeding  Babs Bertin, PharmD, BCPS Clinical Pharmacist 01/28/2018 7:05 PM

## 2018-01-28 NOTE — Progress Notes (Addendum)
ANTICOAGULATION CONSULT NOTE - Initial Consult  Pharmacy Consult for Heparin Indication: atrial fibrillation  Allergies  Allergen Reactions  . Sulfa Antibiotics Rash    Patient Measurements: Height: 5\' 2"  (157.5 cm) Weight: 179 lb 0.2 oz (81.2 kg) IBW/kg (Calculated) : 50.1 Heparin Dosing Weight: 68 kg   Vital Signs: BP: 97/48 (03/31 0729) Pulse Rate: 53 (03/31 0700)  Labs: Recent Labs    01/27/18 0747 01/27/18 0854 01/27/18 0855 01/27/18 0904 01/28/18 0920  HGB 3.5*  --  10.5* 11.6* 8.7*  HCT 10.6*  --  31.3* 34.0* 26.7*  PLT 63*  --  180  --  176  LABPROT  --  21.2*  --   --   --   INR  --  1.85  --   --   --   CREATININE  --  0.73  --  0.80  --     Estimated Creatinine Clearance: 79.3 mL/min (by C-G formula based on SCr of 0.8 mg/dL).   Medical History: Past Medical History:  Diagnosis Date  . Anxiety   . Diabetes (HCC)   . Dysphagia   . Hyperlipidemia   . Hypertension   . Mild mental slowing   . Poor historian   . Schizophrenia (HCC)   . Seizures (HCC)    has not had a seizure since last year. Unknown orgin- no family available to come and patient does not know.    Medications:  Medications Prior to Admission  Medication Sig Dispense Refill Last Dose  . acetaminophen (TYLENOL) 325 MG tablet Take 650 mg by mouth every 6 (six) hours as needed for moderate pain.   Past Week at Unknown time  . amiodarone (PACERONE) 200 MG tablet Take 1 tablet (200 mg total) by mouth 2 (two) times daily.   01/26/2018 at Unknown time  . atorvastatin (LIPITOR) 40 MG tablet Take 40 mg by mouth daily.   01/26/2018 at Unknown time  . benztropine (COGENTIN) 2 MG tablet Take 2 mg by mouth 2 (two) times daily.    01/26/2018 at Unknown time  . cetirizine (ZYRTEC) 10 MG tablet Take 10 mg by mouth daily.   01/26/2018 at Unknown time  . diazepam (VALIUM) 10 MG tablet Take 1 tablet (10 mg total) by mouth daily. (Patient taking differently: Take 10 mg by mouth 2 (two) times daily as needed  for anxiety. ) 30 tablet 0 Past Week at Unknown time  . glimepiride (AMARYL) 4 MG tablet Take 4 mg by mouth 2 (two) times daily.    01/26/2018 at Unknown time  . guaiFENesin-dextromethorphan (ROBITUSSIN DM) 100-10 MG/5ML syrup Take 10 mLs by mouth every 4 (four) hours as needed for cough.   01/26/2018 at Unknown time  . insulin aspart (NOVOLOG) 100 UNIT/ML injection Inject 10 Units into the skin 4 (four) times daily. Give 8 units QID as long as Blood Sugar is above 150.    01/26/2018 at Unknown time  . insulin glargine (LANTUS) 100 UNIT/ML injection Inject 20 Units into the skin at bedtime.   01/26/2018 at Unknown time  . lurasidone (LATUDA) 80 MG TABS tablet Take 80 mg by mouth 2 (two) times daily.   01/26/2018 at Unknown time  . metoprolol succinate (TOPROL-XL) 100 MG 24 hr tablet Take 1 tablet (100 mg total) by mouth 2 (two) times daily before a meal. Take with or immediately following a meal.   01/26/2018 at unknown  . Omega-3 Fatty Acids (OMEGA-3 FISH OIL PO) Take 1,000 mg by mouth 2 (two)  times daily.    01/26/2018 at Unknown time  . omeprazole (PRILOSEC) 20 MG capsule 1 po bid 30 minutes before meals for 3 mos then once daily FOREVER (Patient taking differently: Take 20 mg by mouth 2 (two) times daily before a meal. ) 60 capsule 11 01/26/2018 at Unknown time  . QUEtiapine (SEROQUEL) 200 MG tablet Take 3 tablets (600 mg total) by mouth at bedtime. (Patient taking differently: Take 400-600 mg by mouth 2 (two) times daily. 600 MG IN THE MORNING AND 400 MG IN EVENING)   01/26/2018 at Unknown time  . sodium chloride 1 g tablet Take 1 g by mouth 3 (three) times daily with meals.   01/26/2018 at Unknown time  . warfarin (COUMADIN) 2 MG tablet Take 2 mg by mouth daily. Takes 2 mg daily Monday through Friday   01/26/2018 at unknown  . warfarin (COUMADIN) 3 MG tablet Take 3 mg by mouth daily. Patient takes 3 mg on Saturday and Sunday   Past Week at Unknown time  . acetaminophen-codeine (TYLENOL #3) 300-30 MG tablet  Take 1 tablet by mouth every 4 (four) hours as needed for moderate pain. (Patient not taking: Reported on 01/27/2018) 84 tablet 0 Completed Course at Unknown time  . carbamazepine (TEGRETOL) 200 MG tablet Take 400 mg by mouth 2 (two) times daily.    07/23/2015 at Unknown time  . clotrimazole-betamethasone (LOTRISONE) cream Apply 1 application topically daily as needed (dry skin and rashes).    Not Taking at Unknown time  . CRESTOR 20 MG tablet Take 20 mg by mouth daily.    07/23/2015 at Unknown time  . fenofibrate (TRICOR) 145 MG tablet Take 145 mg by mouth daily.    07/23/2015 at Unknown time  . glyBURIDE (DIABETA) 5 MG tablet Take 2.5 mg by mouth 2 (two) times daily with a meal.    07/23/2015 at Unknown time  . haloperidol (HALDOL) 10 MG tablet Take 1 tablet (10 mg total) by mouth 2 (two) times daily. (Patient not taking: Reported on 01/27/2018)   Not Taking at Unknown time  . HYDROcodone-acetaminophen (NORCO/VICODIN) 5-325 MG tablet Take 1 tablet by mouth every 4 (four) hours as needed. (Patient not taking: Reported on 01/27/2018) 42 tablet 0 Completed Course at Unknown time  . JANTOVEN 2.5 MG tablet Take 5 mg by mouth daily. On sat and sun 2.5 mg on mon,tues,wed, thurs, fri   07/23/2015 at 0800  . LORazepam (ATIVAN) 1 MG tablet Take 1 tablet (1 mg total) by mouth 2 (two) times daily as needed for anxiety (anxiety). (Patient not taking: Reported on 01/27/2018) 30 tablet 0 Not Taking at Unknown time  . metFORMIN (GLUCOPHAGE) 1000 MG tablet Take 1,000 mg by mouth 2 (two) times daily with a meal.   07/23/2015 at Unknown time  . ondansetron (ZOFRAN) 4 MG tablet Take 1 tablet (4 mg total) by mouth every 6 (six) hours as needed for nausea. (Patient not taking: Reported on 01/27/2018) 20 tablet 0 Not Taking at Unknown time  . sucralfate (CARAFATE) 1 G tablet Take 1 g by mouth 2 (two) times daily.    07/23/2015 at Unknown time  . traZODone (DESYREL) 50 MG tablet Take 1 tablet (50 mg total) by mouth at bedtime as needed  for sleep. (Patient not taking: Reported on 01/27/2018)   Not Taking at Unknown time  . warfarin (COUMADIN) 5 MG tablet Take 1 tablet (5 mg total) by mouth daily. (Patient not taking: Reported on 01/27/2018)   Not Taking at  Unknown time    Assessment: [age] YOF with a history of AFib on warfarin at home found unresponsive at home. Pharmacy consulted to start IV heparin bridge. INR on admission was 1.85. H/H 8.7/26.7. Plt 176.   Goal of Therapy:  Heparin level 0.3-0.7 units/ml Monitor platelets by anticoagulation protocol: Yes   Plan:  -Start IV heparin at 900 units/hr -F/u 6 hr HL -Monitor daily HL, CBC and s/s of bleeding  -F/u resuming home warfarin  -Stop subQ heparin   Vinnie LevelBenjamin Rahkeem Senft, PharmD., BCPS Clinical Pharmacist Clinical phone for 01/28/18 until 3:30pm: [phone] If after 3:30pm, please call main pharmacy at: [phone]  Addendum: Notified MD that patient is no longer on carbamazepine at home.   Vinnie LevelBenjamin Brayen Bunn, PharmD., BCPS Clinical Pharmacist

## 2018-01-28 NOTE — Progress Notes (Signed)
eLink Physician-Brief Progress Note Patient Name: Retta Macaula S Katona DOB: 11/24/1960 MRN: [id number]   Date of Service  01/28/2018  HPI/Events of Note  ABG on 50%/PRVC 28/TV 450/P 10 = 7.38/41.5/115/26.  eICU Interventions  Continue present ventilator management.      Intervention Category Major Interventions: Respiratory failure - evaluation and management  Sommer,Steven Eugene 01/28/2018, 10:05 PM

## 2018-01-28 NOTE — Progress Notes (Signed)
ET tube pulled back, per order.  Now @ 22 cm, measured from lip.

## 2018-01-28 NOTE — Progress Notes (Signed)
PULMONARY / CRITICAL CARE MEDICINE   Name: Natasha Martin MRN: [id number] DOB: [birth year] PCP Toma Deiters, MD LOS 1 as of 01/28/2018     ADMISSION DATE:  01/27/2018 CONSULTATION DATE:  01/27/18     BRIEF Essentially no immediate history on this 57 year old with diabetes felt mental delay and schizophrenia who also has a reported history of seizures in the chart.  For the past 2-3 days she has been having a productive cough and she was found in the bathroom this morning unresponsive.  When EMS arrived her saturations were in the 20s.  She was taking to [facility] where she was intubated and chest x-ray has shown diffuse bilateral infiltrates. Admitted 01/27/2018    has a past medical history of Anxiety, Diabetes (HCC), Dysphagia, Hyperlipidemia, Hypertension, Mild mental slowing, Poor historian, Schizophrenia (HCC), and Seizures (HCC).  has a past surgical history that includes Leg Surgery (Bilateral); Colonoscopy with propofol (N/A, 01/28/2014); Esophagogastroduodenoscopy (egd) with propofol (N/A, 01/28/2014); Savory dilation (N/A, 01/28/2014); polypectomy (01/28/2014); and biopsy (01/28/2014).   reports that she has never smoked. She has never used smokeless tobacco.     EVENTS 01/27/2018  - admit to 55M ICU   SUBJECTIVE/OVERNIGHT/INTERVAL HX 01/28/18 - 60% fio2, peep 7 -> easy desats. Not on ARDS protocol. On levophed . TEmp 101F Making some urine. On sedation fent gtt and versed 3mg  gtt -> RASS . RVP negative. UDS pending. Urine strep - negative    VITAL SIGNS: BP (!) 97/48   Pulse (!) 53   Temp 100.1 F (37.8 C) (Oral)   Resp (!) 28   Ht 5\' 2"  (1.575 m)   Wt 81.2 kg (179 lb 0.2 oz)   LMP 01/21/2014   SpO2 99%   BMI 32.74 kg/m   HEMODYNAMICS: CVP:  [7 mmHg-10 mmHg] 7 mmHg  VENTILATOR SETTINGS: Vent Mode: PRVC FiO2 (%):  [60 %-100 %] 60 % Set Rate:  [24 bmp-28 bmp] 28 bmp Vt Set:  [450 mL] 450 mL PEEP:  [5 cmH20-7 cmH20] 7 cmH20 Plateau Pressure:  [24  cmH20-28 cmH20] 24 cmH20  INTAKE / OUTPUT: I/O last 3 completed shifts: In: 5052.5 [I.V.:2432.5; NG/GT:70; IV Piggyback:2550] Out: 835 [Urine:835]     EXAM  General Appearance:    Looks criticall ill OBESE - yes  Head:    Normocephalic, without obvious abnormality, atraumatic  Eyes:    PERRL - yes, conjunctiva/corneas - clear      Ears:    Normal external ear canals, both ears  Nose:   NG tube - no  Throat:  ETT TUBE - yes , OG tube - yes without TF  Neck:   Supple,  No enlargement/tenderness/nodules     Lungs:     Some crackles, Ventilator   Synchrony - ARDS physiology   Chest wall:    No deformity  Heart:    S1 and S2 normal, no murmur, CVP - no.  Pressors - yes levophed 28mcg3  Abdomen:     Soft, no masses, no organomegaly  Genitalia:    Not done  Rectal:   not done  Extremities:   Extremities- intact     Skin:   Intact in exposed areas .     Neurologic:   Sedation - fent/versed gtt -> RASS - -2 . Moves all 4s - yes. CAM-ICU - na . Orientation - na       LABS  PULMONARY Recent Labs  Lab 01/27/18 0820 01/27/18 0904 01/27/18 1507 01/28/18 0445  PHART  7.319*  --  7.292* 7.335*  PCO2ART 44.5  --  51.6* 41.6  PO2ART 70.4*  --  124.0* 119.0*  HCO3 21.6  --  24.6 22.1  TCO2  --  22 26 23   O2SAT 89.7  --  98.0 98.0    CBC Recent Labs  Lab 01/27/18 0747 01/27/18 0855 01/27/18 0904  HGB 3.5* 10.5* 11.6*  HCT 10.6* 31.3* 34.0*  WBC 4.5 12.1*  --   PLT 63* 180  --     COAGULATION Recent Labs  Lab 01/27/18 0854  INR 1.85    CARDIAC  No results for input(s): TROPONINI in the last 168 hours. No results for input(s): PROBNP in the last 168 hours.   CHEMISTRY Recent Labs  Lab 01/27/18 0854 01/27/18 0904  NA 129* 128*  K 4.9 5.0  CL 96* 95*  CO2 20*  --   GLUCOSE 261* 257*  BUN 9 7  CREATININE 0.73 0.80  CALCIUM 7.6*  --    Estimated Creatinine Clearance: 79.3 mL/min (by C-G formula based on SCr of 0.8 mg/dL).   LIVER Recent Labs  Lab  01/27/18 0854  AST 61*  ALT 59*  ALKPHOS 125  BILITOT 1.4*  PROT 6.5  ALBUMIN 2.9*  INR 1.85     INFECTIOUS Recent Labs  Lab 01/27/18 0904 01/27/18 1050 01/27/18 2012  LATICACIDVEN 3.45* 4.5* 1.3     ENDOCRINE CBG (last 3)  Recent Labs    01/27/18 1818 01/28/18 0020 01/28/18 0612  GLUCAP 114* 85 153*         IMAGING x48h  - image(s) personally visualized  -   highlighted in bold Ct Head Wo Contrast  Result Date: 01/27/2018 CLINICAL DATA:  57 year old female with altered level of consciousness. EXAM: CT HEAD WITHOUT CONTRAST TECHNIQUE: Contiguous axial images were obtained from the base of the skull through the vertex without intravenous contrast. COMPARISON:  02/11/2015 and prior CTs FINDINGS: Brain: No evidence of acute infarction, hemorrhage, hydrocephalus, extra-axial collection or mass lesion/mass effect. Mild chronic small-vessel white matter ischemic changes are again noted. Vascular: Mild atherosclerotic calcifications noted. Skull: Normal. Negative for fracture or focal lesion. Sinuses/Orbits: No acute abnormality. Mild RIGHT maxillary sinus mucosal thickening noted. Other: None. IMPRESSION: 1. No evidence of acute intracranial abnormality. 2. Mild chronic small-vessel white matter ischemic changes. Electronically Signed   By: Harmon PierJeffrey  Hu M.D.   On: 01/27/2018 18:08   Dg Chest Portable 1 View  Result Date: 01/27/2018 CLINICAL DATA:  Central line placement EXAM: PORTABLE CHEST 1 VIEW COMPARISON:  Chest radiograph from earlier today. FINDINGS: Endotracheal tube tip is 1.0 cm above the carina. Enteric tube enters stomach with the tip not seen on this image. Right internal jugular central venous catheter terminates in the lower third of the superior vena cava. Left subclavian central venous catheter courses superiorly in the neck with the tip not seen on this image, unchanged. Stable cardiomediastinal silhouette with poorly visualized cardiac silhouette. No  pneumothorax. No appreciable significant pleural effusion. Severe patchy consolidation throughout both lungs, unchanged. IMPRESSION: 1. Endotracheal tube tip is 1.0 cm above the carina. Consider retracting 1.0 cm. 2. Malpositioned left subclavian central venous catheter coursing superiorly in the left neck. This catheter has reportedly been removed by the time of this dictation. 3. Persistent severe patchy consolidation throughout both lungs, which could represent multifocal pneumonia, severe pulmonary edema and/or ARDS. These results were called by telephone at the time of interpretation on 01/27/2018 at 4:07 pm to [facility] FOWLER RN , who  verbally acknowledged these results. Electronically Signed   By: Delbert Phenix M.D.   On: 01/27/2018 16:10   Dg Chest Portable 1 View  Result Date: 01/27/2018 CLINICAL DATA:  evaluate central line placement. EXAM: PORTABLE CHEST 1 VIEW COMPARISON:  None FINDINGS: Interval placement of left subclavian catheter. The catheter enters left IJ vein and courses cranially. The tip is not visualized. Enteric tube tip is below the level of the GE junction. Diffuse bilateral pulmonary opacities are identified. Not changed from previous exam. IMPRESSION: 1. Malpositioned left subclavian catheter which enters the left IJ vein and extends cranially. 2. No change in aeration to the lungs compared with previous exam. Electronically Signed   By: Signa Kell M.D.   On: 01/27/2018 14:44   Dg Chest Port 1 View  Result Date: 01/27/2018 CLINICAL DATA:  Intubation. EXAM: PORTABLE CHEST 1 VIEW COMPARISON:  01/20/2016 FINDINGS: Endotracheal tube tip 16 mm above the carina. An orogastric tube reaches the stomach at least. Low lung volumes with extensive bilateral airspace opacity. No visible pneumothorax. Heart size and mediastinal contours are obscured. Partially visualized right humeral head which shows possible dislocation and fragmentation. There was a displaced fracture by 2016 radiography.  Remote left rib fracture. IMPRESSION: 1. Endotracheal tube tip 16 mm above the carina. An orogastric tube reaches the stomach. 2. Extensive airspace disease which could reflect pneumonia, cardiogenic edema, and/or ARDS. Electronically Signed   By: Marnee Spring M.D.   On: 01/27/2018 08:15   Active Problems:   Acute respiratory failure (HCC)   ARDS (adult respiratory distress syndrome) (HCC)       ASSESSMENT / PLAN:  PULMONARY A: #baseline - no known pulmonary issues #current: Admitted with aRDS  01/28/2018 -> This AM with ARDS - RASS -2 and easy desats with fio2 peep 7, fio2 60%  P:   Increase peep to 10, continue fio2 60% Start ARDS protocol  RASS goal should be -4/-3 Check ABG Based on above, consider Nimbex  CARDIOVASCULAR A:  #Baseline - Known A Fib patient . ?  on amio, lipitor, tricor lopresso, omega 3, coumadin at home. Last echo 2016  #Admit  -  Circulatory shock at admit with fever (RVP and urine strep engative), EKG - sinus  01/28/2018 -> Ongoing circulatory shock . Levophed . EKG   P:  Repeat EKG LEvophed for MAP goal > 65 When stable aim for diuresis and low CVP Check trop ? Needs to be on IV heparin - will ask pharmacy to review Check echo  RENAL  Intake/Output Summary (Last 24 hours) at 01/28/2018 0851 Last data filed at 01/28/2018 0700 Gross per 24 hour  Intake 4852.52 ml  Output 835 ml  Net 4017.52 ml   Recent Labs  Lab 01/27/18 0854 01/27/18 0904  CREATININE 0.73 0.80     A:   #baseline:  Normal renal function #current  At admit - normal renal function  01/28/2018 -> labs pending. AT risk for AKI given ARDS  P:   Await bmet   GASTROINTESTINAL A:   On PPI at home Currently NPO  P:   contnue NPO Start TF after Dietary consult PPI   HEMATOLOGIC Recent Labs  Lab 01/27/18 0747 01/27/18 0855 01/27/18 0904  HGB 3.5* 10.5* 11.6*  HCT 10.6* 31.3* 34.0*  WBC 4.5 12.1*  --   PLT 63* 180  --     A:   #RBC: mild anemia  of critical illness (admit hgb 3.5 spurious) #Platelet normal #WBC high wbc  P:  -  PRBC for hgb </= 6.9gm%    - exceptions are   -  if ACS susepcted/confirmed then transfuse for hgb </= 8.0gm%,  or    -  active bleeding with hemodynamic instability, then transfuse regardless of hemoglobin value   At at all times try to transfuse 1 unit prbc as possible with exception of active hemorrhage    INFECTIOUS No results for input(s): PROCALCITON in the last 168 hours.  Results for orders placed or performed during the hospital encounter of 01/27/18  Culture, blood (routine x 2)     Status: None (Preliminary result)   Collection Time: 01/27/18  8:58 AM  Result Value Ref Range Status   Specimen Description LEFT ANTECUBITAL  Final   Special Requests   Final    AEROBIC BOTTLE ONLY Blood Culture results may not be optimal due to an inadequate volume of blood received in culture bottles   Culture   Final    NO GROWTH < 24 HOURS Performed at [facility], [address] [place], [place] [address]    Report Status PENDING  Incomplete  Culture, blood (routine x 2)     Status: None (Preliminary result)   Collection Time: 01/27/18  9:04 AM  Result Value Ref Range Status   Specimen Description BLOOD RIGHT ARM  Final   Special Requests   Final    BOTTLES DRAWN AEROBIC AND ANAEROBIC Blood Culture results may not be optimal due to an inadequate volume of blood received in culture bottles   Culture   Final    NO GROWTH < 24 HOURS Performed at [facility], [address], [place], [place] [address]    Report Status PENDING  Incomplete  MRSA PCR Screening     Status: None   Collection Time: 01/27/18  1:31 PM  Result Value Ref Range Status   MRSA by PCR NEGATIVE NEGATIVE Final    Comment:        The GeneXpert MRSA Assay (FDA approved for NASAL specimens only), is one component of a comprehensive MRSA colonization surveillance program. It is not intended to diagnose MRSA infection nor to guide  or monitor treatment for MRSA infections. Performed at [facility], [address] [place], [place] [address]   Respiratory Panel by PCR     Status: None   Collection Time: 01/27/18  2:55 PM  Result Value Ref Range Status   Adenovirus NOT DETECTED NOT DETECTED Final   Coronavirus 229E NOT DETECTED NOT DETECTED Final   Coronavirus HKU1 NOT DETECTED NOT DETECTED Final   Coronavirus NL63 NOT DETECTED NOT DETECTED Final   Coronavirus OC43 NOT DETECTED NOT DETECTED Final   Metapneumovirus NOT DETECTED NOT DETECTED Final   Rhinovirus / Enterovirus NOT DETECTED NOT DETECTED Final   Influenza A NOT DETECTED NOT DETECTED Final   Influenza B NOT DETECTED NOT DETECTED Final   Parainfluenza Virus 1 NOT DETECTED NOT DETECTED Final   Parainfluenza Virus 2 NOT DETECTED NOT DETECTED Final   Parainfluenza Virus 3 NOT DETECTED NOT DETECTED Final   Parainfluenza Virus 4 NOT DETECTED NOT DETECTED Final   Respiratory Syncytial Virus NOT DETECTED NOT DETECTED Final   Bordetella pertussis NOT DETECTED NOT DETECTED Final   Chlamydophila pneumoniae NOT DETECTED NOT DETECTED Final   Mycoplasma pneumoniae NOT DETECTED NOT DETECTED Final    Comment: Performed at [facility], [address], [place], [place] [address]    A:   Likely sepstic shock based on fever and presentation. Source  probably lung P:   Await urine leg Check PCT Anti-infectives (From admission, onward)   Start     Dose/Rate Route Frequency Ordered Stop   01/27/18 1800  vancomycin (VANCOCIN) IVPB 750 mg/150 ml premix     750 mg 150 mL/hr over 60 Minutes Intravenous Every 8 hours 01/27/18 1327     01/27/18 1600  piperacillin-tazobactam (ZOSYN) IVPB 3.375 g     3.375 g 12.5 mL/hr over 240 Minutes Intravenous Every 8 hours 01/27/18 1327     01/27/18 1345  azithromycin (ZITHROMAX) 500 mg in sodium chloride 0.9 % 250 mL IVPB     500 mg 250 mL/hr over 60 Minutes Intravenous Daily 01/27/18 1237     01/27/18 1245  oseltamivir  (TAMIFLU) capsule 75 mg  Status:  Discontinued     75 mg Per Tube 2 times daily 01/27/18 1236 01/27/18 1402   01/27/18 0945  piperacillin-tazobactam (ZOSYN) IVPB 3.375 g     3.375 g 100 mL/hr over 30 Minutes Intravenous  Once 01/27/18 0930 01/27/18 1013   01/27/18 0945  vancomycin (VANCOCIN) IVPB 1000 mg/200 mL premix     1,000 mg 200 mL/hr over 60 Minutes Intravenous  Once 01/27/18 0930 01/27/18 1117       ENDOCRINE A:   DM -0 on glybrudie, insulin, metformin AT risk hypegluycemia  P:   ICU hyperglycemia protocol  NEUROLOGIC A:   #Baseline : Psych hx + -  ? on trazadone, seroquel, vicodine, tegretol , cogentin, and tylonl # 3 at home  #Current:  aDmie for ARDS - Currently RASS -2    P:   RASS goal: -4 in ARDS; RN Asked to increased versed Might need to consider Nimbex for vent synchrony ? Needs tegretol; will ask pharmacy       FAMILY  - Updates: 01/28/2018 --> none at bedside  - Inter-disciplinary family meet or Palliative Care meeting due by:  DAy 7. Current LOS is LOS 1 days  CODE STATUS    Code Status Orders  (From admission, onward)        Start     Ordered   01/27/18 1234  Full code  Continuous     01/27/18 1236    Code Status History    Date Active Date Inactive Code Status Order ID Comments User Context   02/11/2015 1544 02/20/2015 1730 Full Code [id number]  Elgergawy, Leana Roe, MD Inpatient        DISPO Keep in ICU      The patient is critically ill with multiple organ systems failure and requires high complexity decision making for assessment and support, frequent evaluation and titration of therapies, application of advanced monitoring technologies and extensive interpretation of multiple databases.   Critical Care Time devoted to patient care services described in this note is  45  Minutes. This time reflects time of care of this signee Dr Kalman Shan. This critical care time does not reflect procedure time, or teaching time or  supervisory time of PA/NP/Med student/Med Resident etc but could involve care discussion time   Dr. Kalman Shan, M.D., [facility].C.P Pulmonary and Critical Care Medicine Staff Physician, [facility] Center Director - Interstitial Lung Disease  Program  Pulmonary Fibrosis [facility] Network at [facility] [place], [place], [address]  Pager: [phone], If no answer or between  15:00h - 7:00h: call [phone] Telephone: [phone]

## 2018-01-28 NOTE — Progress Notes (Signed)
Lab review  - ABG better with nimbex and on ards protocol - hgb drop but no active bleed - low mag/phos - repleted by pharmacy  Plan Tracheal aspirate rechck cbc, bmet, abg at 9pm   Dr. Kalman ShanMurali Jasir Rother, M.D., [facility].C.P Pulmonary and Critical Care Medicine Staff Physician, [place] [facility] Center Director - Interstitial Lung Disease  Program  Pulmonary Fibrosis [facility] Network at [facility] [place], [place], [id number]  Pager: [phone], If no answer or between  15:00h - 7:00h: call [phone] Telephone: [phone]     PULMONARY Recent Labs  Lab 01/27/18 0820 01/27/18 0904 01/27/18 1507 01/28/18 0445 01/28/18 0935 01/28/18 1535  PHART 7.319*  --  7.292* 7.335* 7.292* 7.420  PCO2ART 44.5  --  51.6* 41.6 49.7* 36.6  PO2ART 70.4*  --  124.0* 119.0* 91.0 107.0  HCO3 21.6  --  24.6 22.1 23.7 23.6  TCO2  --  22 26 23 25 25   O2SAT 89.7  --  98.0 98.0 95.0 98.0    CBC Recent Labs  Lab 01/27/18 0747 01/27/18 0855 01/27/18 0904 01/28/18 0920  HGB 3.5* 10.5* 11.6* 8.7*  HCT 10.6* 31.3* 34.0* 26.7*  WBC 4.5 12.1*  --  10.9*  PLT 63* 180  --  176    COAGULATION Recent Labs  Lab 01/27/18 0854 01/28/18 0920  INR 1.85 2.15    CARDIAC   Recent Labs  Lab 01/28/18 0920 01/28/18 1449  TROPONINI <0.03 <0.03   No results for input(s): PROBNP in the last 168 hours.   CHEMISTRY Recent Labs  Lab 01/27/18 0854 01/27/18 0904 01/28/18 0920 01/28/18 1448  NA 129* 128* 129*  --   K 4.9 5.0 3.8  --   CL 96* 95* 100*  --   CO2 20*  --  24  --   GLUCOSE 261* 257* 124*  --   BUN 9 7 6   --   CREATININE 0.73 0.80 0.73  --   CALCIUM 7.6*  --  6.9*  --   MG  --   --  1.5* 2.3  PHOS  --   --  2.3* 2.2*   Estimated Creatinine Clearance: 79.3 mL/min (by C-G formula based on SCr of 0.73 mg/dL).   LIVER Recent Labs  Lab 01/27/18 0854 01/28/18 0920  AST 61* 82*  ALT 59* 84*  ALKPHOS 125 108  BILITOT 1.4* 1.7*  PROT 6.5 4.8*   ALBUMIN 2.9* 1.9*  INR 1.85 2.15     INFECTIOUS Recent Labs  Lab 01/27/18 1050 01/27/18 2012 01/28/18 0920  LATICACIDVEN 4.5* 1.3 0.8  PROCALCITON  --   --  0.40     ENDOCRINE CBG (last 3)  Recent Labs    01/28/18 0920 01/28/18 1203 01/28/18 1531  GLUCAP 121* 103* 102*

## 2018-01-28 NOTE — Progress Notes (Signed)
abg review on 60% fio2/peep 10 -> 7.30/47/86 -> PF ratio 143.3  Plan Start nimbex    Dr. Kalman ShanMurali Sunset Joshi, M.D., [facility].C.P Pulmonary and Critical Care Medicine Staff Physician, [facility] Center Director - Interstitial Lung Disease  Program  Pulmonary Fibrosis [facility] Network at [facility] [place], [place], [id number]  Pager: [phone], If no answer or between  15:00h - 7:00h: call [phone] Telephone: [phone]

## 2018-01-29 ENCOUNTER — Inpatient Hospital Stay (HOSPITAL_COMMUNITY): Payer: Medicare Other

## 2018-01-29 DIAGNOSIS — R931 Abnormal findings on diagnostic imaging of heart and coronary circulation: Secondary | ICD-10-CM

## 2018-01-29 DIAGNOSIS — I480 Paroxysmal atrial fibrillation: Secondary | ICD-10-CM

## 2018-01-29 LAB — CBC WITH DIFFERENTIAL/PLATELET
BASOS ABS: 0 10*3/uL (ref 0.0–0.1)
BASOS PCT: 0 %
EOS ABS: 0.2 10*3/uL (ref 0.0–0.7)
Eosinophils Relative: 2 %
HCT: 23.4 % — ABNORMAL LOW (ref 36.0–46.0)
Hemoglobin: 7.5 g/dL — ABNORMAL LOW (ref 12.0–15.0)
Lymphocytes Relative: 11 %
Lymphs Abs: 0.9 10*3/uL (ref 0.7–4.0)
MCH: 30.5 pg (ref 26.0–34.0)
MCHC: 32.1 g/dL (ref 30.0–36.0)
MCV: 95.1 fL (ref 78.0–100.0)
MONOS PCT: 6 %
Monocytes Absolute: 0.5 10*3/uL (ref 0.1–1.0)
Neutro Abs: 6.7 10*3/uL (ref 1.7–7.7)
Neutrophils Relative %: 81 %
PLATELETS: 148 10*3/uL — AB (ref 150–400)
RBC: 2.46 MIL/uL — ABNORMAL LOW (ref 3.87–5.11)
RDW: 14.9 % (ref 11.5–15.5)
WBC: 8.3 10*3/uL (ref 4.0–10.5)

## 2018-01-29 LAB — GLUCOSE, CAPILLARY
GLUCOSE-CAPILLARY: 139 mg/dL — AB (ref 65–99)
GLUCOSE-CAPILLARY: 154 mg/dL — AB (ref 65–99)
GLUCOSE-CAPILLARY: 171 mg/dL — AB (ref 65–99)
Glucose-Capillary: 161 mg/dL — ABNORMAL HIGH (ref 65–99)
Glucose-Capillary: 246 mg/dL — ABNORMAL HIGH (ref 65–99)
Glucose-Capillary: 194 mg/dL — ABNORMAL HIGH (ref 65–99)

## 2018-01-29 LAB — POCT I-STAT 3, ART BLOOD GAS (G3+)
ACID-BASE DEFICIT: 2 mmol/L (ref 0.0–2.0)
ACID-BASE DEFICIT: 3 mmol/L — AB (ref 0.0–2.0)
Acid-Base Excess: 1 mmol/L (ref 0.0–2.0)
BICARBONATE: 26.8 mmol/L (ref 20.0–28.0)
Bicarbonate: 24 mmol/L (ref 20.0–28.0)
Bicarbonate: 28 mmol/L (ref 20.0–28.0)
O2 SAT: 88 %
O2 Saturation: 94 %
O2 Saturation: 99 %
PCO2 ART: 50.2 mmHg — AB (ref 32.0–48.0)
PH ART: 7.337 — AB (ref 7.350–7.450)
PO2 ART: 79 mmHg — AB (ref 83.0–108.0)
Patient temperature: 37.5
Patient temperature: 37.9
Patient temperature: 37.9
TCO2: 25 mmol/L (ref 22–32)
TCO2: 31 mmol/L (ref 22–32)
TCO2: 28 mmol/L (ref 22–32)
pCO2 arterial: 45.1 mmHg (ref 32.0–48.0)
pCO2 arterial: 90.2 mmHg (ref 32.0–48.0)
pH, Arterial: 7.105 — CL (ref 7.350–7.450)
pH, Arterial: 7.339 — ABNORMAL LOW (ref 7.350–7.450)
pO2, Arterial: 77 mmHg — ABNORMAL LOW (ref 83.0–108.0)
pO2, Arterial: 133 mmHg — ABNORMAL HIGH (ref 83.0–108.0)

## 2018-01-29 LAB — TYPE AND SCREEN
ABO/RH(D): A POS
ANTIBODY SCREEN: NEGATIVE

## 2018-01-29 LAB — BASIC METABOLIC PANEL
Anion gap: 8 (ref 5–15)
Anion gap: 8 (ref 5–15)
BUN: 9 mg/dL (ref 6–20)
BUN: 11 mg/dL (ref 6–20)
CALCIUM: 6.8 mg/dL — AB (ref 8.9–10.3)
CHLORIDE: 101 mmol/L (ref 101–111)
CO2: 22 mmol/L (ref 22–32)
CO2: 21 mmol/L — AB (ref 22–32)
CREATININE: 0.8 mg/dL (ref 0.44–1.00)
CREATININE: 0.8 mg/dL (ref 0.44–1.00)
Calcium: 6.8 mg/dL — ABNORMAL LOW (ref 8.9–10.3)
Chloride: 103 mmol/L (ref 101–111)
GFR calc Af Amer: >60 mL/min (ref >60)
GFR calc non Af Amer: >60 mL/min (ref >60)
GLUCOSE: 175 mg/dL — AB (ref 65–99)
Glucose, Bld: 192 mg/dL — ABNORMAL HIGH (ref 65–99)
Potassium: 3.5 mmol/L (ref 3.5–5.1)
Potassium: 3.5 mmol/L (ref 3.5–5.1)
Sodium: 132 mmol/L — ABNORMAL LOW (ref 135–145)
Sodium: 131 mmol/L — ABNORMAL LOW (ref 135–145)

## 2018-01-29 LAB — CBC
HEMATOCRIT: 23.5 % — AB (ref 36.0–46.0)
HEMOGLOBIN: 7.9 g/dL — AB (ref 12.0–15.0)
MCH: 32 pg (ref 26.0–34.0)
MCHC: 33.6 g/dL (ref 30.0–36.0)
MCV: 95.1 fL (ref 78.0–100.0)
Platelets: 137 10*3/uL — ABNORMAL LOW (ref 150–400)
RBC: 2.47 MIL/uL — ABNORMAL LOW (ref 3.87–5.11)
RDW: 15.3 % (ref 11.5–15.5)
WBC: 7.7 10*3/uL (ref 4.0–10.5)

## 2018-01-29 LAB — TROPONIN I
Troponin I: <0.03 ng/mL (ref <0.03)
Troponin I: <0.03 ng/mL (ref <0.03)

## 2018-01-29 LAB — PROTIME-INR
INR: 2.06
Prothrombin Time: 23 seconds — ABNORMAL HIGH (ref 11.4–15.2)

## 2018-01-29 LAB — PHOSPHORUS
PHOSPHORUS: 2.5 mg/dL (ref 2.5–4.6)
Phosphorus: 2.7 mg/dL (ref 2.5–4.6)

## 2018-01-29 LAB — PROCALCITONIN: Procalcitonin: 0.37 ng/mL

## 2018-01-29 LAB — HEPATIC FUNCTION PANEL
ALBUMIN: 1.6 g/dL — AB (ref 3.5–5.0)
ALK PHOS: 93 U/L (ref 38–126)
ALT: 58 U/L — AB (ref 14–54)
AST: 38 U/L (ref 15–41)
BILIRUBIN INDIRECT: 0.5 mg/dL (ref 0.3–0.9)
BILIRUBIN TOTAL: 1.1 mg/dL (ref 0.3–1.2)
Bilirubin, Direct: 0.6 mg/dL — ABNORMAL HIGH (ref 0.1–0.5)
Total Protein: 4.2 g/dL — ABNORMAL LOW (ref 6.5–8.1)

## 2018-01-29 LAB — ABO/RH: ABO/RH(D): A POS

## 2018-01-29 LAB — MAGNESIUM
MAGNESIUM: 1.8 mg/dL (ref 1.7–2.4)
MAGNESIUM: 1.6 mg/dL — AB (ref 1.7–2.4)

## 2018-01-29 MED ORDER — MUPIROCIN CALCIUM 2 % EX CREA
TOPICAL_CREAM | Freq: Every day | CUTANEOUS | Status: DC
  Administered 2018-01-29: 15:00:00 via TOPICAL
  Administered 2018-01-30: 1 via TOPICAL
  Administered 2018-01-31 – 2018-02-07 (×8): via TOPICAL
  Administered 2018-02-08 – 2018-02-11 (×4): 1 via TOPICAL
  Administered 2018-02-12 – 2018-02-13 (×2): via TOPICAL
  Filled 2018-01-29: qty 15

## 2018-01-29 MED ORDER — ACETAMINOPHEN 160 MG/5ML PO SOLN
650.0000 mg | Freq: Four times a day (QID) | ORAL | Status: DC | PRN
  Administered 2018-01-29 – 2018-02-05 (×6): 650 mg
  Filled 2018-01-29 (×6): qty 20.3

## 2018-01-29 MED ORDER — VITAL HIGH PROTEIN PO LIQD
1000.0000 mL | ORAL | Status: DC
  Administered 2018-01-29 – 2018-02-04 (×8): 1000 mL
  Filled 2018-01-29: qty 1000

## 2018-01-29 MED ORDER — LACTATED RINGERS IV BOLUS
1000.0000 mL | Freq: Once | INTRAVENOUS | Status: AC
  Administered 2018-01-29: 1000 mL via INTRAVENOUS

## 2018-01-29 MED ORDER — FUROSEMIDE 10 MG/ML IJ SOLN
40.0000 mg | Freq: Once | INTRAMUSCULAR | Status: AC
  Administered 2018-01-29: 40 mg via INTRAVENOUS
  Filled 2018-01-29: qty 4

## 2018-01-29 MED ORDER — CHLORHEXIDINE GLUCONATE 0.12% ORAL RINSE (MEDLINE KIT)
15.0000 mL | Freq: Two times a day (BID) | OROMUCOSAL | Status: DC
  Administered 2018-01-29 – 2018-02-05 (×14): 15 mL via OROMUCOSAL

## 2018-01-29 MED ORDER — HEPARIN (PORCINE) IN NACL 100-0.45 UNIT/ML-% IJ SOLN
2100.0000 [IU]/h | INTRAMUSCULAR | Status: DC
  Administered 2018-01-29 – 2018-01-30 (×2): 1000 [IU]/h via INTRAVENOUS
  Administered 2018-01-30: 1350 [IU]/h via INTRAVENOUS
  Administered 2018-01-31: 2000 [IU]/h via INTRAVENOUS
  Administered 2018-02-01: 2100 [IU]/h via INTRAVENOUS
  Administered 2018-02-01: 2150 [IU]/h via INTRAVENOUS
  Administered 2018-02-02 – 2018-02-06 (×9): 2100 [IU]/h via INTRAVENOUS
  Filled 2018-01-29 (×25): qty 250

## 2018-01-29 MED ORDER — ORAL CARE MOUTH RINSE
15.0000 mL | OROMUCOSAL | Status: DC
  Administered 2018-01-29 – 2018-02-05 (×69): 15 mL via OROMUCOSAL

## 2018-01-29 NOTE — Progress Notes (Signed)
Initial Nutrition Assessment  DOCUMENTATION CODES:   Obesity unspecified  INTERVENTION:    Vital High Protein at 50 ml/h (1200 ml per day)  D/C Pro-stat  Provides 1200 kcal, 105 gm protein, 1003 ml free water daily  NUTRITION DIAGNOSIS:   Inadequate oral intake related to inability to eat as evidenced by NPO status.  GOAL:   Provide needs based on ASPEN/SCCM guidelines  MONITOR:   Vent status, Labs, TF tolerance, Skin  REASON FOR ASSESSMENT:   Ventilator, Consult Enteral/tube feeding initiation and management  ASSESSMENT:   57 yo female with PMH of DM, HLD, schizophrenia, mental delay, anxiety, HTN, dysphagia, and seizures who was admitted on 3/30 with ARDS after being found unresponsive; 2-3 day hx of productive cough; [facility]  Discussed patient in ICU rounds and with RN today. Received MD Consult for TF initiation and management. Patient is currently intubated on ventilator support MV: 10.1 L/min Temp (24hrs), Avg:100 F (37.8 C), Min:98.6 F (37 C), Max:100.8 F (38.2 C)   Labs reviewed. Sodium 131 (L) CBG's: [phone] Medications reviewed and include Nimbex. Pressors are off.    NUTRITION - FOCUSED PHYSICAL EXAM:    Most Recent Value  Orbital Region  No depletion  Upper Arm Region  No depletion  Thoracic and Lumbar Region  Unable to assess  Buccal Region  No depletion  Temple Region  No depletion  Clavicle Bone Region  No depletion  Clavicle and Acromion Bone Region  No depletion  Scapular Bone Region  Unable to assess  Dorsal Hand  Unable to assess  Patellar Region  No depletion  Anterior Thigh Region  No depletion  Posterior Calf Region  No depletion  Edema (RD Assessment)  None  Hair  Reviewed  Eyes  Unable to assess  Mouth  Unable to assess  Skin  Reviewed  Nails  Unable to assess       Diet Order:  Diet NPO time specified  EDUCATION NEEDS:   No education needs have been identified at this time  Skin:   Skin Assessment: Reviewed RN Assessment  Last BM:  unknown  Height:   Ht Readings from Last 1 Encounters:  01/27/18 5\' 2"  (1.575 m)    Weight:   Wt Readings from Last 1 Encounters:  01/29/18 187 lb 9.8 oz (85.1 kg)    Ideal Body Weight:  50 kg  BMI:  Body mass index is 34.31 kg/m.  Estimated Nutritional Needs:   Kcal:  [phone]  Protein:  100 gm  Fluid:  1.5 L    Joaquin CourtsKimberly Harris, RD, LDN, CNSC Pager [phone] After Hours Pager [phone]

## 2018-01-29 NOTE — Progress Notes (Signed)
Order to discontinue heparin noted per pharmacy order at 0500. heparin infusion stopped at 0500.

## 2018-01-29 NOTE — Progress Notes (Signed)
PULMONARY / CRITICAL CARE MEDICINE   Name: Natasha Martin MRN: [id number] DOB: 12/15/1960    ADMISSION DATE:  01/27/2018 CONSULTATION DATE:  01/27/2018  HISTORY OF PRESENT ILLNESS:   Essentially no immediate history on this 57 year old with diabetes felt mental delay and schizophrenia who also has a reported history of seizures in the chart. For the past 2-3 days she has been having a productive cough and she was found in the bathroom this morning unresponsive. When EMS arrived her saturations were in the 20s. She was taking to [facility] where she was intubated and chest x-ray has shown diffuse bilateral infiltrates. Admitted 01/27/2018    has a past medical history of Anxiety, Diabetes (HCC), Dysphagia, Hyperlipidemia, Hypertension, Mild mental slowing, Poor historian, Schizophrenia (HCC), and Seizures (HCC).  has a past surgical history that includes Leg Surgery (Bilateral); Colonoscopy with propofol (N/A, 01/28/2014); Esophagogastroduodenoscopy (egd) with propofol (N/A, 01/28/2014); Savory dilation (N/A, 01/28/2014); polypectomy (01/28/2014); and biopsy (01/28/2014).   reports that she has never smoked. She has never used smokeless tobacco.  SUBJECTIVE:  On ARDS protocol. FiO2 50% with a PEEP of 10. On Nimbex, started yesterday. On sedation with fentanyl gtt and versed. Febrile to 100.4 overnight.   VITAL SIGNS: BP (!) 108/47   Pulse 66   Temp 99.1 F (37.3 C) (Oral)   Resp (!) 28   Ht 5\' 2"  (1.575 m)   Wt 187 lb 9.8 oz (85.1 kg)   LMP 01/21/2014   SpO2 99%   BMI 34.31 kg/m   HEMODYNAMICS: CVP:  [5 mmHg-8 mmHg] 6 mmHg  VENTILATOR SETTINGS: Vent Mode: PRVC FiO2 (%):  [50 %-60 %] 50 % Set Rate:  [28 bmp] 28 bmp Vt Set:  [450 mL] 450 mL PEEP:  [10 cmH20] 10 cmH20 Plateau Pressure:  [28 cmH20-29 cmH20] 29 cmH20  INTAKE / OUTPUT: I/O last 3 completed shifts: In: 8709.9 [I.V.:6071.2; NG/GT:318.7; IV Piggyback:2320] Out: 2885 [Urine:2515; Emesis/NG output:370]  PHYSICAL  EXAMINATION: General:  Critically ill, intubated, sedated and paralyzed   Head: Normocephalic, without obvious abnormality, atraumatic  Eyes: PERRL - yes, conjunctiva/corneas - clear    Ears: Normal external ear canals, both ears  Nose: NG tube - no  Throat: ETT TUBE - yes , OG tube - yes with TF    Neuro:  Sedated on fentanyl and versed Cardiovascular:  normal rate, regular rhythm, no murmur, no gallop, and no rub. Pressors - yes, levophed Lungs:  Some crackles present. ARDS protocol with synchrony on vent. Abdomen: soft, non-tender, normal bowel sounds, no distention, no guarding, no rebound Musculoskeletal:  Some twitching present Skin:  intact  LABS:  BMET Recent Labs  Lab 01/28/18 0015 01/28/18 0920 01/29/18 0200  NA 132* 129* 131*  K 3.5 3.8 3.5  CL 103 100* 101  CO2 21* 24 22  BUN 9 6 11   CREATININE 0.80 0.73 0.80  GLUCOSE 175* 124* 192*    Electrolytes Recent Labs  Lab 01/28/18 0015 01/28/18 0920 01/28/18 1448 01/29/18 0200  CALCIUM 6.8* 6.9*  --  6.8*  MG  --  1.5* 2.3 1.8  PHOS  --  2.3* 2.2* 2.5    CBC Recent Labs  Lab 01/28/18 0015 01/28/18 0920 01/29/18 0200  WBC 7.7 10.9* 8.3  HGB 7.9* 8.7* 7.5*  HCT 23.5* 26.7* 23.4*  PLT 137* 176 148*    Coag's Recent Labs  Lab 01/27/18 0854 01/28/18 0920 01/29/18 0545  INR 1.85 2.15 2.06    Sepsis Markers Recent Labs  Lab 01/27/18  1050 01/27/18 2012 01/28/18 0920 01/29/18 0200  LATICACIDVEN 4.5* 1.3 0.8  --   PROCALCITON  --   --  0.40 0.37    ABG Recent Labs  Lab 01/28/18 1535 01/28/18 2110 01/29/18 0511  PHART 7.420 7.383 7.337*  PCO2ART 36.6 41.5 45.1  PO2ART 107.0 115.0* 77.0*    Liver Enzymes Recent Labs  Lab 01/27/18 0854 01/28/18 0920 01/29/18 0200  AST 61* 82* 38  ALT 59* 84* 58*  ALKPHOS 125 108 93  BILITOT 1.4* 1.7* 1.1  ALBUMIN 2.9* 1.9* 1.6*    Cardiac Enzymes Recent Labs  Lab 01/28/18 0920 01/28/18 1449 01/29/18 0200  TROPONINI <0.03 <0.03 <0.03   <0.03    Glucose Recent Labs  Lab 01/28/18 0920 01/28/18 1203 01/28/18 1531 01/28/18 1940 01/28/18 2330 01/29/18 0356  GLUCAP 121* 103* 102* 90 122* 161*    Imaging Dg Chest Port 1 View  Result Date: 01/29/2018 CLINICAL DATA:  Hypoxia EXAM: PORTABLE CHEST 1 VIEW COMPARISON:  January 28, 2018 FINDINGS: Endotracheal tube tip is 3.5 cm above the carina. Central catheter tip is in superior vena cava. Nasogastric tube tip and side port are below the diaphragm. No pneumothorax. There is airspace consolidation in both lower lung zones with small pleural effusions bilaterally. There is also a degree of underlying interstitial pulmonary edema. Cardiomegaly is present with pulmonary vascularity within normal limits. There old healed rib fractures on the left. 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 IMPRESSION: Tube and catheter positions as described without pneumothorax. Airspace consolidation concerning for pneumonia both lower lung zones with small pleural effusions. There is cardiomegaly with mild interstitial edema. Old fracture right humerus. Electronically Signed   By: Bretta BangWilliam  Woodruff III M.D.   On: 01/29/2018 07:39   Dg Chest Port 1 View  Result Date: 01/28/2018 CLINICAL DATA:  Adult respiratory distress syndrome. EXAM: PORTABLE CHEST 1 VIEW COMPARISON:  01/27/2018. FINDINGS: There is a right IJ catheter with tip in the projection of the distal SVC. The enteric tube tip is below the GE junction. The heart size appears within normal limits. There has been interval improvement in aeration to both upper lobes. Persistent lower lobe opacities. Chronic fracture of the proximal right humerus. IMPRESSION: Interval improvement in aeration of both upper lobes with persistent lower lobe opacities. Electronically Signed   By: Signa Kellaylor  Stroud M.D.   On: 01/28/2018 09:55   STUDIES:  Strep Pneumo - negative Legionella UrAg - pending Flu panel - negative RSVP -  negative  CULTURES: 01/27/2018 Blood Culture: NG at 2 days  ANTIBIOTICS: Azithromycin 3/30 >> Vancomycin 3/30 >> Zosyn 3/30 >>  SIGNIFICANT EVENTS: 01/27/2018: Transferred from [facility]; intubated; CXR with bilateral infiltrates 01/28/2018: ARDS protocol; Nimbex started  LINES/TUBES: RIJ 3/30 >> L Femoral A-line 3/30 >>    ASSESSMENT / PLAN:  PULMONARY #baseline - no known pulmonary issues #current: Admitted with aRDS  01/28/2018 -> This AM with ARDS - RASS -2 and easy desats with fio2 peep 7, fio2 60%  P:   Continue peep to 10, continue fio2 50% Continue ARDS protocol  RASS goal should be -4/-3 Check ABG daily  Continue Nimbex, consider discontinuing this afternoon is improving   CARDIOVASCULAR #Baseline - Known A Fib patient . ?  on amio, lipitor, tricor lopresso, omega 3, coumadin at home. Last echo 2016  #Admit  -  Circulatory shock at admit with fever (RVP and urine strep engative), EKG - sinus  Ongoing circulatory shock . Levophed 3mcg. EKG  P:  Repeat EKG LEvophed for MAP goal > 65 When stable aim for diuresis and low CVP On heparin gtt ECHO yesterday with EF 55-60%. Aortic valve grossly abnormal. Consider TEE when clinically improved.   RENAL #baseline:  Normal renal function #current  At admit - normal renal function AT risk for AKI given ARDS  P:   Follow labs. Cr stable today.   GASTROINTESTINAL On PPI at home Currently NPO  P:   contnue NPO TF started yesterday, continue PPI  HEMATOLOGIC A:   #RBC: mild anemia of critical illness (admit hgb 3.5 spurious) #Platelet normal #WBC high wbc  P:  - PRBC for hgb </= 6.9gm%    INFECTIOUS Likely sepstic shock based on fever and presentation. Source probably lung P:   Await urine leg PCT 0.37   ENDOCRINE DM -0 on glybrudie, insulin, metformin AT risk hypegluycemia  P:   ICU hyperglycemia protocol    NEUROLOGIC #Baseline : Psych hx + -  ? on trazadone, seroquel,  vicodine, tegretol , cogentin, and tylonl # 3 at home  #Current:  aDmie for ARDS - Currently RASS -2    P:   RASS goal: -4 in ARDS    FAMILY  - Updates: No family at bedside    Pulmonary and Critical Care Medicine [facility] Pager: [phone]  01/29/2018, 7:53 AM
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
## 2018-01-29 NOTE — Consult Note (Addendum)
Cardiology Consultation:   Patient ID: Natasha Martin; [id number]; 12-Apr-1961   Admit date: 01/27/2018 Date of Consult: 01/29/2018  Primary Care Provider: Neale Burly, MD Primary Cardiologist: Kirk Ruths, MD  Primary Electrophysiologist:     Patient Profile:   Natasha Martin is a 57 y.o. female with a hx of mental illness including mental delay and schizophrenia previously lived in a group home, hx of seizures, anxiety, diabetes, HLD, and HTN who is being seen today for the evaluation of abnormal echo at the request of Dr. Vaughan Browner.  History of Present Illness:   Natasha Martin was hospitalized 01/2015 with altered mental status and transferred to [facility] for further management. She had ischemic colitis and rectal bleeding.  She developed Afib/flutter with RVR and was treated with amiodarone and cardizem drip. She was seen by Dr. Mare Ferrari at during that hospitalization. She was discharged on amiodarone 200 mg BID and coumadin for anticoagulation. The plan was for rate control and possible DCCV following three weeks of documented therapeutic INRs.  It does not appear that she followed up with cardiology.   [name] was brought to [facility] ED 01/27/18 after being found with altered mental status and hypoxia. She was ultimately intubated with CXR showing diffuse bilateral infiltrates. ABX started for ARDS. She was ultimately transferred to [facility] for further care. Amiodarone was stopped in the setting of ARDS with questionable amiodarone toxicity.   Echocardiogram obtained to evaluate cardiac component to her respiratory failure. Echo with preserved EF, grade 2 DD. Aortic valve appeared thickened and calcified, but not clearly visualized. Mitral valve with calcified annulus noted to be significant for her age. Recommendations for TEE to better visualize aortic valve.   On my interview, the patient remains intubated and sedated. No family at bedside. History gathered from [employer] and nursing.  Primary has  started heparin drip for history of Afib.  Past Medical History:  Diagnosis Date  . Anxiety   . Diabetes ([place])   . Dysphagia   . Hyperlipidemia   . Hypertension   . Mild mental slowing   . Poor historian   . Schizophrenia ([place])   . Seizures ([place])    has not had a seizure since last year. Unknown orgin- no family available to come and patient does not know.    Past Surgical History:  Procedure Laterality Date  . BIOPSY  01/28/2014   Procedure: BIOPSY (Gastric);  Surgeon: Danie Binder, MD;  Location: [facility];  Service: Endoscopy;;  . COLONOSCOPY WITH PROPOFOL N/A 01/28/2014   IRC:VELFYB mucosa in the terminal ileum/small internal and external hemorrhoids  . ESOPHAGOGASTRODUODENOSCOPY (EGD) WITH PROPOFOL N/A 01/28/2014   OFB:PZWCHE esophagitis/stricture at the gastro junction/moderate erosive  . 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 POLYPECTOMY  01/28/2014   Procedure: POLYPECTOMY (Rectal);  Surgeon: Danie Binder, MD;  Location: [facility];  Service: Endoscopy;;  . SAVORY DILATION N/A 01/28/2014   Procedure: SAVORY DILATION  (12.27m to 135m;  Surgeon: SaDanie BinderMD;  Location: [facility];  Service: Endoscopy;  Laterality: N/A;     Home Medications:  Prior to Admission medications   Medication Sig Start Date End Date Taking? Authorizing Provider  acetaminophen (TYLENOL) 325 MG tablet Take 650 mg by mouth every 6 (six) hours as needed for moderate pain.   Yes [provider]  amiodarone (PACERONE) 200 MG tablet Take 1 tablet (200 mg total) by mouth 2 (two) times  daily. 02/20/15  Yes Rama, Venetia Maxon, MD  atorvastatin (LIPITOR) 40 MG tablet Take 40 mg by mouth daily.   Yes [provider]  benztropine (COGENTIN) 2 MG tablet Take 2 mg by mouth 2 (two) times daily.  01/08/14  Yes [provider]  cetirizine (ZYRTEC) 10 MG tablet Take 10 mg by mouth daily.   Yes [provider]  diazepam (VALIUM) 10 MG tablet Take  1 tablet (10 mg total) by mouth daily. Patient taking differently: Take 10 mg by mouth 2 (two) times daily as needed for anxiety.  02/20/15  Yes Rama, Venetia Maxon, MD  glimepiride (AMARYL) 4 MG tablet Take 4 mg by mouth 2 (two) times daily.  01/08/14  Yes [provider]  guaiFENesin-dextromethorphan (ROBITUSSIN DM) 100-10 MG/5ML syrup Take 10 mLs by mouth every 4 (four) hours as needed for cough.   Yes [provider]  insulin aspart (NOVOLOG) 100 UNIT/ML injection Inject 10 Units into the skin 4 (four) times daily. Give 8 units QID as long as Blood Sugar is above 150.    Yes [provider]  insulin glargine (LANTUS) 100 UNIT/ML injection Inject 20 Units into the skin at bedtime.   Yes [provider]  lurasidone (LATUDA) 80 MG TABS tablet Take 80 mg by mouth 2 (two) times daily.   Yes [provider]  metoprolol succinate (TOPROL-XL) 100 MG 24 hr tablet Take 1 tablet (100 mg total) by mouth 2 (two) times daily before a meal. Take with or immediately following a meal. 02/20/15  Yes Rama, Venetia Maxon, MD  Omega-3 Fatty Acids (OMEGA-3 FISH OIL PO) Take 1,000 mg by mouth 2 (two) times daily.    Yes [provider]  omeprazole (PRILOSEC) 20 MG capsule 1 po bid 30 minutes before meals for 3 mos then once daily FOREVER Patient taking differently: Take 20 mg by mouth 2 (two) times daily before a meal.  01/28/14  Yes Fields, Sandi L, MD  QUEtiapine (SEROQUEL) 200 MG tablet Take 3 tablets (600 mg total) by mouth at bedtime. Patient taking differently: Take 400-600 mg by mouth 2 (two) times daily. 600 MG IN THE MORNING AND 400 MG IN EVENING 02/20/15  Yes Rama, Venetia Maxon, MD  sodium chloride 1 g tablet Take 1 g by mouth 3 (three) times daily with meals.   Yes [provider]  warfarin (COUMADIN) 2 MG tablet Take 2 mg by mouth daily. Takes 2 mg daily Monday through Friday   Yes [provider]  warfarin (COUMADIN) 3 MG tablet Take 3 mg by  mouth daily. Patient takes 3 mg on Saturday and Sunday   Yes [provider]  acetaminophen-codeine (TYLENOL #3) 300-30 MG tablet Take 1 tablet by mouth every 4 (four) hours as needed for moderate pain. Patient not taking: Reported on 01/27/2018 08/13/15   Carole Civil, MD  carbamazepine (TEGRETOL) 200 MG tablet Take 400 mg by mouth 2 (two) times daily.  01/08/14   [provider]  clotrimazole-betamethasone (LOTRISONE) cream Apply 1 application topically daily as needed (dry skin and rashes).  11/25/13   [provider]  CRESTOR 20 MG tablet Take 20 mg by mouth daily.  01/08/14   [provider]  fenofibrate (TRICOR) 145 MG tablet Take 145 mg by mouth daily.  01/08/14   [provider]  glyBURIDE (DIABETA) 5 MG tablet Take 2.5 mg by mouth 2 (two) times daily with a meal.  01/08/14   [provider]  haloperidol (  HALDOL) 10 MG tablet Take 1 tablet (10 mg total) by mouth 2 (two) times daily. Patient not taking: Reported on 01/27/2018 02/20/15   Rama, Venetia Maxon, MD  HYDROcodone-acetaminophen (NORCO/VICODIN) 5-325 MG tablet Take 1 tablet by mouth every 4 (four) hours as needed. Patient not taking: Reported on 01/27/2018 07/30/15   Carole Civil, MD  JANTOVEN 2.5 MG tablet Take 5 mg by mouth daily. On sat and sun 2.5 mg on mon,tues,wed, thurs, fri 07/17/15   [provider]  LORazepam (ATIVAN) 1 MG tablet Take 1 tablet (1 mg total) by mouth 2 (two) times daily as needed for anxiety (anxiety). Patient not taking: Reported on 01/27/2018 02/20/15   Rama, Venetia Maxon, MD  metFORMIN (GLUCOPHAGE) 1000 MG tablet Take 1,000 mg by mouth 2 (two) times daily with a meal.    [provider]  ondansetron (ZOFRAN) 4 MG tablet Take 1 tablet (4 mg total) by mouth every 6 (six) hours as needed for nausea. Patient not taking: Reported on 01/27/2018 02/20/15   Rama, Venetia Maxon, MD  sucralfate (CARAFATE) 1 G tablet Take 1 g by mouth 2 (two) times  daily.  07/02/15   [provider]  traZODone (DESYREL) 50 MG tablet Take 1 tablet (50 mg total) by mouth at bedtime as needed for sleep. Patient not taking: Reported on 01/27/2018 02/20/15   Rama, Venetia Maxon, MD  warfarin (COUMADIN) 5 MG tablet Take 1 tablet (5 mg total) by mouth daily. Patient not taking: Reported on 01/27/2018 02/20/15   Rama, Venetia Maxon, MD    Inpatient Medications: Scheduled Meds: . artificial tears  1 application Both Eyes Z6X  . chlorhexidine gluconate (MEDLINE KIT)  15 mL Mouth Rinse BID  . Chlorhexidine Gluconate Cloth  6 each Topical Daily  . feeding supplement (VITAL HIGH PROTEIN)  1,000 mL Per Tube Q24H  . fentaNYL (SUBLIMAZE) injection  100 mcg Intravenous Once  . insulin aspart  2-6 Units Subcutaneous Q4H  . mouth rinse  15 mL Mouth Rinse QID  . mupirocin cream   Topical Daily  . pantoprazole sodium  40 mg Per Tube Q1200  . sodium chloride flush  10-40 mL Intracatheter Q12H   Continuous Infusions: . sodium chloride 10 mL/hr at 01/29/18 0900  . azithromycin Stopped (01/29/18 1120)  . cisatracurium (NIMBEX) infusion 4 mcg/kg/min (01/29/18 0817)  . fentaNYL infusion INTRAVENOUS 350 mcg/hr (01/29/18 1220)  . heparin    . lactated ringers Stopped (01/29/18 1200)  . levETIRAcetam Stopped (01/29/18 0960)  . midazolam (VERSED) infusion 8 mg/hr (01/29/18 0800)  . norepinephrine (LEVOPHED) Adult infusion Stopped (01/28/18 1800)  . piperacillin-tazobactam (ZOSYN)  IV Stopped (01/29/18 1210)   PRN Meds: sodium chloride, acetaminophen (TYLENOL) oral liquid 160 mg/5 mL, fentaNYL, fentaNYL (SUBLIMAZE) injection, fentaNYL (SUBLIMAZE) injection, midazolam, midazolam, sodium chloride flush  Allergies:    Allergies  Allergen Reactions  . Sulfa Antibiotics Rash    Social History:   Social History   Socioeconomic History  . Marital status: Single    Spouse name: Not on file  . Number of children: Not on file  . Years of education: Not on file  . Highest  education level: Not on file  Occupational History  . Not on file  Social Needs  . Financial resource strain: Not on file  . Food insecurity:    Worry: Not on file    Inability: Not on file  . Transportation needs:    Medical: Not on file    Non-medical: Not on file  Tobacco Use  . Smoking status: Never Smoker  . Smokeless tobacco: Never Used  Substance and Sexual Activity  . Alcohol use: No    Comment: history of ETOH abuse  . Drug use: No  . Sexual activity: Never    Birth control/protection: None  Lifestyle  . Physical activity:    Days per week: Not on file    Minutes per session: Not on file  . Stress: Not on file  Relationships  . Social connections:    Talks on phone: Not on file    Gets together: Not on file    Attends religious service: Not on file    Active member of club or organization: Not on file    Attends meetings of clubs or organizations: Not on file    Relationship status: Not on file  . Intimate partner violence:    Fear of current or ex partner: Not on file    Emotionally abused: Not on file    Physically abused: Not on file    Forced sexual activity: Not on file  Other Topics Concern  . Not on file  Social History Narrative  . Not on file    Family History:    Family History  Problem Relation Age of Onset  . Colon cancer Unknown        unknown     ROS:  Please see the history of present illness.   All other ROS reviewed and negative.     Physical Exam/Data:   Vitals:   01/29/18 1100 01/29/18 1155 01/29/18 1200 01/29/18 1300  BP: (!) 98/47 (!) 122/44 (!) 143/51 (!) 155/52  Pulse: 71 77 94 (!) 104  Resp: (!) 28 (!) 35 (!) 35 (!) 35  Temp: (!) 100.8 F (38.2 C)  (!) 100.8 F (38.2 C) (!) 100.4 F (38 C)  TempSrc: Bladder  Bladder   SpO2: 100% 94% 97% 93%  Weight:      Height:        Intake/Output Summary (Last 24 hours) at 01/29/2018 1411 Last data filed at 01/29/2018 1210 Gross per 24 hour  Intake 6263.32 ml  Output 1595 ml    Net 4668.32 ml   Filed Weights   01/27/18 0803 01/27/18 1800 01/29/18 0252  Weight: 173 lb (78.5 kg) 179 lb 0.2 oz (81.2 kg) 187 lb 9.8 oz (85.1 kg)   Body mass index is 34.31 kg/m.  General:  Female, intubated and sedated Cardiac:  normal S1, S2; RRR; no murmur Lungs:  Intubated, coarse sounds throughout  Abd: soft, nontender, no hepatomegaly  Ext: diffuse edema in upper and lower extremities Musculoskeletal:  No deformities Skin: warm and dry  Neuro:  sedated Psych:  sedated   EKG:  The EKG was personally reviewed and demonstrates:  Sinus rhythm Telemetry:  Telemetry was personally reviewed and demonstrates:  Sinus to sinus tachycardia  Relevant CV Studies:  Echo 01/28/18: Study Conclusions - Left ventricle: The cavity size was normal. Wall thickness was   increased in a pattern of mild LVH. Systolic function was normal.   The estimated ejection fraction was in the range of 55% to 60%.   Wall motion was normal; there were no regional wall motion   abnormalities. Features are consistent with a pseudonormal left   ventricular filling pattern, with concomitant abnormal relaxation   and increased filling pressure (grade 2 diastolic dysfunction). - Aortic valve: The aortic valve is markedly abnormal. Appears   trileaflet but I cannot see the left coronary cusp  well. The   non-coronary cusp is markedly thickened and calcified. The right   coronary cusp may be fused. There is significant turbulence over   the valve with mild stenosis. Would strongly consider TEE. There   was trivial regurgitation. Valve area (VTI): 1.66 cm^2. Valve   area (Vmax): 1.66 cm^2. Valve area (Vmean): 1.72 cm^2. - Mitral valve: Calcified annulus. - Left atrium: The atrium was moderately dilated. - Atrial septum: There was increased thickness of the septum,   consistent with lipomatous hypertrophy. - Impressions: Abnormal aortic valve as described above. Also with   signifcant MAC for her age. Would  strongly consider TEE.  Impressions: - Abnormal aortic valve as described above. Also with signifcant   MAC for her age. Would strongly consider TEE.  Laboratory Data:  Chemistry Recent Labs  Lab 01/28/18 0015 01/28/18 0920 01/29/18 0200  NA 132* 129* 131*  K 3.5 3.8 3.5  CL 103 100* 101  CO2 21* 24 22  GLUCOSE 175* 124* 192*  BUN _0 CREATININE 0.80 0.73 0.80  CALCIUM 6.8* 6.9* 6.8*  GFRNONAA >60 >60 >60  GFRAA >60 >60 >60  ANIONGAP _1 Recent Labs  Lab 01/27/18 0854 01/28/18 0920 01/29/18 0200  PROT 6.5 4.8* 4.2*  ALBUMIN 2.9* 1.9* 1.6*  AST 61* 82* 38  ALT 59* 84* 58*  ALKPHOS 125 108 93  BILITOT 1.4* 1.7* 1.1   Hematology Recent Labs  Lab 01/28/18 0015 01/28/18 0920 01/29/18 0200  WBC 7.7 10.9* 8.3  RBC 2.47* 2.82* 2.46*  HGB 7.9* 8.7* 7.5*  HCT 23.5* 26.7* 23.4*  MCV 95.1 94.7 95.1  MCH 32.0 30.9 30.5  MCHC 33.6 32.6 32.1  RDW 15.3 14.8 14.9  PLT 137* 176 148*   Cardiac Enzymes Recent Labs  Lab 01/28/18 0015 01/28/18 0920 01/28/18 1449 01/29/18 0200  TROPONINI <0.03 <0.03 <0.03 <0.03  <0.03    Recent Labs  Lab 01/27/18 0745  TROPIPOC 0.01    BNPNo results for input(s): BNP, PROBNP in the last 168 hours.  DDimer No results for input(s): DDIMER in the last 168 hours.  Radiology/Studies:  Ct Head Wo Contrast  Result Date: 01/27/2018 CLINICAL DATA:  57 year old female with altered level of consciousness. EXAM: CT HEAD WITHOUT CONTRAST TECHNIQUE: Contiguous axial images were obtained from the base of the skull through the vertex without intravenous contrast. COMPARISON:  02/11/2015 and prior CTs FINDINGS: Brain: No evidence of acute infarction, hemorrhage, hydrocephalus, extra-axial collection or mass lesion/mass effect. Mild chronic small-vessel white matter ischemic changes are again noted. Vascular: Mild atherosclerotic calcifications noted. Skull: Normal. Negative for fracture or focal lesion. Sinuses/Orbits: No acute  abnormality. Mild RIGHT maxillary sinus mucosal thickening noted. Other: None. IMPRESSION: 1. No evidence of acute intracranial abnormality. 2. Mild chronic small-vessel white matter ischemic changes. Electronically Signed   By: Margarette Canada M.D.   On: 01/27/2018 18:08   Dg Chest Port 1 View  Result Date: 01/29/2018 CLINICAL DATA:  Hypoxia EXAM: PORTABLE CHEST 1 VIEW COMPARISON:  January 28, 2018 FINDINGS: Endotracheal tube tip is 3.5 cm above the carina. Central catheter tip is in superior vena cava. Nasogastric tube tip and side port are below the diaphragm. No pneumothorax. There is airspace consolidation in both lower lung zones with small pleural effusions bilaterally. There is also a degree of underlying interstitial pulmonary edema. Cardiomegaly is present with pulmonary vascularity within normal limits. There old healed rib fractures on the left. There is an old fracture of the  proximal right humerus with nonunion. IMPRESSION: Tube and catheter positions as described without pneumothorax. Airspace consolidation concerning for pneumonia both lower lung zones with small pleural effusions. There is cardiomegaly with mild interstitial edema. Old fracture right humerus. Electronically Signed   By: Lowella Grip III M.D.   On: 01/29/2018 07:39   Dg Chest Port 1 View  Result Date: 01/28/2018 CLINICAL DATA:  Adult respiratory distress syndrome. EXAM: PORTABLE CHEST 1 VIEW COMPARISON:  01/27/2018. FINDINGS: There is a right IJ catheter with tip in the projection of the distal SVC. The enteric tube tip is below the GE junction. The heart size appears within normal limits. There has been interval improvement in aeration to both upper lobes. Persistent lower lobe opacities. Chronic fracture of the proximal right humerus. IMPRESSION: Interval improvement in aeration of both upper lobes with persistent lower lobe opacities. Electronically Signed   By: Kerby Moors M.D.   On: 01/28/2018 09:55   Dg Chest  Portable 1 View  Result Date: 01/27/2018 CLINICAL DATA:  Central line placement EXAM: PORTABLE CHEST 1 VIEW COMPARISON:  Chest radiograph from earlier today. FINDINGS: Endotracheal tube tip is 1.0 cm above the carina. Enteric tube enters stomach with the tip not seen on this image. Right internal jugular central venous catheter terminates in the lower third of the superior vena cava. Left subclavian central venous catheter courses superiorly in the neck with the tip not seen on this image, unchanged. Stable cardiomediastinal silhouette with poorly visualized cardiac silhouette. No pneumothorax. No appreciable significant pleural effusion. Severe patchy consolidation throughout both lungs, unchanged. IMPRESSION: 1. Endotracheal tube tip is 1.0 cm above the carina. Consider retracting 1.0 cm. 2. Malpositioned left subclavian central venous catheter coursing superiorly in the left neck. This catheter has reportedly been removed by the time of this dictation. 3. Persistent severe patchy consolidation throughout both lungs, which could represent multifocal pneumonia, severe pulmonary edema and/or ARDS. These results were called by telephone at the time of interpretation on 01/27/2018 at 4:07 pm to [place] , who verbally acknowledged these results. Electronically Signed   By: Ilona Sorrel M.D.   On: 01/27/2018 16:10   Dg Chest Portable 1 View  Result Date: 01/27/2018 CLINICAL DATA:  evaluate central line placement. EXAM: PORTABLE CHEST 1 VIEW COMPARISON:  None FINDINGS: Interval placement of left subclavian catheter. The catheter enters left IJ vein and courses cranially. The tip is not visualized. Enteric tube tip is below the level of the GE junction. Diffuse bilateral pulmonary opacities are identified. Not changed from previous exam. IMPRESSION: 1. Malpositioned left subclavian catheter which enters the left IJ vein and extends cranially. 2. No change in aeration to the lungs compared with previous  exam. Electronically Signed   By: Kerby Moors M.D.   On: 01/27/2018 14:44   Dg Chest Port 1 View  Result Date: 01/27/2018 CLINICAL DATA:  Intubation. EXAM: PORTABLE CHEST 1 VIEW COMPARISON:  01/20/2016 FINDINGS: Endotracheal tube tip 16 mm above the carina. An orogastric tube reaches the stomach at least. Low lung volumes with extensive bilateral airspace opacity. No visible pneumothorax. Heart size and mediastinal contours are obscured. Partially visualized right humeral head which shows possible dislocation and fragmentation. There was a displaced fracture by 2016 radiography. Remote left rib fracture. IMPRESSION: 1. Endotracheal tube tip 16 mm above the carina. An orogastric tube reaches the stomach. 2. Extensive airspace disease which could reflect pneumonia, cardiogenic edema, and/or ARDS. Electronically Signed   By: Monte Fantasia M.D.   On:  01/27/2018 08:15    Assessment and Plan:   1. History of Afib, previously on amiodarone and coumadin - INR on admission was 1.85 - No evidence in Epic of Afib/flutter since 2016 - primary team has started heparin drip - telemetry with sinus rhythm - she has a high probability of reverting back to fib/flutter given her overall clinical picture - agree with avoiding amiodarone in the setting of respiratory failure - if she converts to Afib/flutter, would recommend rate control with IV diltiazem   2. Aortic valve calcification, mitral valve calcification - echo reviewed with MD - no indication for TEE during this hospitalization - blood cultures are negative - NGTD, no concern for endocarditis at this time   3. Chronic diastolic heart failure - she initially received IVF hydration for ARDS - received IV lasix today with good urine output - do not anticipate the need for ongoing lasix given her overall clinical picture     For questions or updates, please contact [facility] Please consult [email or web address] for contact info under  Cardiology/STEMI.   Signed, [place], [place]  01/29/2018 2:11 PM As above, patient seen and examined.  Briefly she is a 57 year old female with past medical history of schizophrenia, seizure activity, hypertension, hyperlipidemia, diabetes mellitus, atrial flutter on chronic amiodarone 200 mg twice daily admitted with respiratory failure/sepsis for evaluation of abnormal electrocardiogram and history of paroxysmal atrial flutter.  Patient has been on amiodarone 200 mg twice daily and Coumadin since 2016.  She has not seen cardiology since that time.  She was admitted with altered mental status, respiratory failure and hypoxia requiring intubation.  She has been treated with antibiotics.  Echocardiogram was performed and showed normal LV function, calcified aortic valve with mild aortic stenosis and trace aortic insufficiency.  Transesophageal echocardiogram recommended.  Patient is intubated and sedated at time of evaluation.  Chest x-ray is concerning for pneumonia.  Electrocardiogram shows sinus rhythm with first-degree AV block.  1 abnormal echocardiogram-I have personally reviewed the patient's echocardiogram.  The patient has normal LV systolic function.  The aortic valve is calcified with reduced cusp excursion.  There is at most mild aortic stenosis.  There is trace aortic insufficiency.  I do not think patient needs transesophageal echocardiogram at this point.  Would continue therapy for pneumonia and we can reassess patient as an outpatient.  2 probable pneumonia-antibiotics per critical care medicine.  Patient has been on amiodarone 200 mg twice daily for 3 years.  This would increase risk of amiodarone lung toxicity.  I agree with holding amiodarone.  3 history of paroxysmal atrial fibrillation/flutter-CHADSvasc 3.  Can resume Coumadin at time of discharge.  If atrial fibrillation recurs while off of amiodarone would rate control.  Patient does not  appear to have ongoing cardiac issues.   Please call with any further questions.  Kirk Ruths, MD

## 2018-01-29 NOTE — Progress Notes (Signed)
Paged that patient had MAP <60 and pressures were dropping. She was previously on Levo but was able to be weaned off yesterday evening. This AM her CVP was 17 and after a dose of lasix it is now at 4. On PE she does have some sacral edema and diminished bibasilar breath sounds. I reviewed the CXR from this AM that showed bilateral infiltrates. Cardiology has evaluated the patient and do not feel she needs further diuresis.   Given that the patient's CVP is 4 we will given her a bolus of LR with a target CVP of 8-12 prior to restarting pressors. Will continue to monitor.

## 2018-01-29 NOTE — Progress Notes (Signed)
ANTICOAGULATION CONSULT NOTE  Pharmacy Consult for Heparin Indication: atrial fibrillation  Allergies  Allergen Reactions  . Sulfa Antibiotics Rash    Patient Measurements: Height: 5\' 2"  (157.5 cm) Weight: 187 lb 9.8 oz (85.1 kg) IBW/kg (Calculated) : 50.1 Heparin Dosing Weight: 68 kg   Vital Signs: Temp: 99.1 F (37.3 C) (04/01 0359) Temp Source: Oral (04/01 0359) BP: 108/47 (04/01 0512) Pulse Rate: 66 (04/01 0512)  Labs: Recent Labs    01/27/18 0854  01/28/18 0015 01/28/18 0920 01/28/18 1449 01/28/18 1805 01/29/18 0200 01/29/18 0545  HGB  --    < > 7.9* 8.7*  --   --  7.5*  --   HCT  --    < > 23.5* 26.7*  --   --  23.4*  --   PLT  --    < > 137* 176  --   --  148*  --   LABPROT 21.2*  --   --  23.8*  --   --   --  23.0*  INR 1.85  --   --  2.15  --   --   --  2.06  HEPARINUNFRC  --   --   --   --   --  <0.10*  --   --   CREATININE 0.73   < > 0.80 0.73  --   --  0.80  --   TROPONINI  --    < > <0.03 <0.03 <0.03  --  <0.03  <0.03  --    < > = values in this interval not displayed.    Estimated Creatinine Clearance: 81.3 mL/min (by C-G formula based on SCr of 0.8 mg/dL).   Medical History: Past Medical History:  Diagnosis Date  . Anxiety   . Diabetes (HCC)   . Dysphagia   . Hyperlipidemia   . Hypertension   . Mild mental slowing   . Poor historian   . Schizophrenia (HCC)   . Seizures (HCC)    has not had a seizure since last year. Unknown orgin- no family available to come and patient does not know.    Medications:  Medications Prior to Admission  Medication Sig Dispense Refill Last Dose  . acetaminophen (TYLENOL) 325 MG tablet Take 650 mg by mouth every 6 (six) hours as needed for moderate pain.   Past Week at Unknown time  . amiodarone (PACERONE) 200 MG tablet Take 1 tablet (200 mg total) by mouth 2 (two) times daily.   01/26/2018 at Unknown time  . atorvastatin (LIPITOR) 40 MG tablet Take 40 mg by mouth daily.   01/26/2018 at Unknown time  .  benztropine (COGENTIN) 2 MG tablet Take 2 mg by mouth 2 (two) times daily.    01/26/2018 at Unknown time  . cetirizine (ZYRTEC) 10 MG tablet Take 10 mg by mouth daily.   01/26/2018 at Unknown time  . diazepam (VALIUM) 10 MG tablet Take 1 tablet (10 mg total) by mouth daily. (Patient taking differently: Take 10 mg by mouth 2 (two) times daily as needed for anxiety. ) 30 tablet 0 Past Week at Unknown time  . glimepiride (AMARYL) 4 MG tablet Take 4 mg by mouth 2 (two) times daily.    01/26/2018 at Unknown time  . guaiFENesin-dextromethorphan (ROBITUSSIN DM) 100-10 MG/5ML syrup Take 10 mLs by mouth every 4 (four) hours as needed for cough.   01/26/2018 at Unknown time  . insulin aspart (NOVOLOG) 100 UNIT/ML injection Inject 10 Units into the skin 4 (  four) times daily. Give 8 units QID as long as Blood Sugar is above 150.    01/26/2018 at Unknown time  . insulin glargine (LANTUS) 100 UNIT/ML injection Inject 20 Units into the skin at bedtime.   01/26/2018 at Unknown time  . lurasidone (LATUDA) 80 MG TABS tablet Take 80 mg by mouth 2 (two) times daily.   01/26/2018 at Unknown time  . metoprolol succinate (TOPROL-XL) 100 MG 24 hr tablet Take 1 tablet (100 mg total) by mouth 2 (two) times daily before a meal. Take with or immediately following a meal.   01/26/2018 at unknown  . Omega-3 Fatty Acids (OMEGA-3 FISH OIL PO) Take 1,000 mg by mouth 2 (two) times daily.    01/26/2018 at Unknown time  . omeprazole (PRILOSEC) 20 MG capsule 1 po bid 30 minutes before meals for 3 mos then once daily FOREVER (Patient taking differently: Take 20 mg by mouth 2 (two) times daily before a meal. ) 60 capsule 11 01/26/2018 at Unknown time  . QUEtiapine (SEROQUEL) 200 MG tablet Take 3 tablets (600 mg total) by mouth at bedtime. (Patient taking differently: Take 400-600 mg by mouth 2 (two) times daily. 600 MG IN THE MORNING AND 400 MG IN EVENING)   01/26/2018 at Unknown time  . sodium chloride 1 g tablet Take 1 g by mouth 3 (three) times daily  with meals.   01/26/2018 at Unknown time  . warfarin (COUMADIN) 2 MG tablet Take 2 mg by mouth daily. Takes 2 mg daily Monday through Friday   01/26/2018 at unknown  . warfarin (COUMADIN) 3 MG tablet Take 3 mg by mouth daily. Patient takes 3 mg on Saturday and Sunday   Past Week at Unknown time  . acetaminophen-codeine (TYLENOL #3) 300-30 MG tablet Take 1 tablet by mouth every 4 (four) hours as needed for moderate pain. (Patient not taking: Reported on 01/27/2018) 84 tablet 0 Completed Course at Unknown time  . carbamazepine (TEGRETOL) 200 MG tablet Take 400 mg by mouth 2 (two) times daily.    07/23/2015 at Unknown time  . clotrimazole-betamethasone (LOTRISONE) cream Apply 1 application topically daily as needed (dry skin and rashes).    Not Taking at Unknown time  . CRESTOR 20 MG tablet Take 20 mg by mouth daily.    07/23/2015 at Unknown time  . fenofibrate (TRICOR) 145 MG tablet Take 145 mg by mouth daily.    07/23/2015 at Unknown time  . glyBURIDE (DIABETA) 5 MG tablet Take 2.5 mg by mouth 2 (two) times daily with a meal.    07/23/2015 at Unknown time  . haloperidol (HALDOL) 10 MG tablet Take 1 tablet (10 mg total) by mouth 2 (two) times daily. (Patient not taking: Reported on 01/27/2018)   Not Taking at Unknown time  . HYDROcodone-acetaminophen (NORCO/VICODIN) 5-325 MG tablet Take 1 tablet by mouth every 4 (four) hours as needed. (Patient not taking: Reported on 01/27/2018) 42 tablet 0 Completed Course at Unknown time  . JANTOVEN 2.5 MG tablet Take 5 mg by mouth daily. On sat and sun 2.5 mg on mon,tues,wed, thurs, fri   07/23/2015 at 0800  . LORazepam (ATIVAN) 1 MG tablet Take 1 tablet (1 mg total) by mouth 2 (two) times daily as needed for anxiety (anxiety). (Patient not taking: Reported on 01/27/2018) 30 tablet 0 Not Taking at Unknown time  . metFORMIN (GLUCOPHAGE) 1000 MG tablet Take 1,000 mg by mouth 2 (two) times daily with a meal.   07/23/2015 at Unknown time  .  ondansetron (ZOFRAN) 4 MG tablet Take 1  tablet (4 mg total) by mouth every 6 (six) hours as needed for nausea. (Patient not taking: Reported on 01/27/2018) 20 tablet 0 Not Taking at Unknown time  . sucralfate (CARAFATE) 1 G tablet Take 1 g by mouth 2 (two) times daily.    07/23/2015 at Unknown time  . traZODone (DESYREL) 50 MG tablet Take 1 tablet (50 mg total) by mouth at bedtime as needed for sleep. (Patient not taking: Reported on 01/27/2018)   Not Taking at Unknown time  . warfarin (COUMADIN) 5 MG tablet Take 1 tablet (5 mg total) by mouth daily. (Patient not taking: Reported on 01/27/2018)   Not Taking at Unknown time    Assessment: [age] YOF with a history of AFib on warfarin at home found unresponsive at home. Pharmacy consulted to start IV heparin bridge. INR on admit was 1.85 and heparin was initiated 3/31 then discontinued as INR rose to 2.15. Of note, heparin was subtherapeutic at 900 units/hr.   INR now trending back down to 2.06 and likely to continue trending down, per records last dose of warfarin was 3/29 PTA. Will resume heparin at 1800 tonight with no bolus.  Goal of Therapy:  Heparin level 0.3-0.7 units/ml Monitor platelets by anticoagulation protocol: Yes   Plan:  -Heparin 1000 units/hr with no bolus starting at 1800 tonight -Check 6-hr heparin level -Daily heparin level, CBC, S/Sx bleeding  Fredonia HighlandMichael Bitonti, PharmD, BCPS PGY-2 Cardiology Pharmacy Resident Pager: [phone] 01/29/2018

## 2018-01-30 ENCOUNTER — Inpatient Hospital Stay (HOSPITAL_COMMUNITY): Payer: Medicare Other

## 2018-01-30 ENCOUNTER — Inpatient Hospital Stay: Payer: Self-pay

## 2018-01-30 ENCOUNTER — Encounter (HOSPITAL_COMMUNITY): Payer: Self-pay | Admitting: *Deleted

## 2018-01-30 LAB — CBC WITH DIFFERENTIAL/PLATELET
BASOS PCT: 0 %
Basophils Absolute: 0 10*3/uL (ref 0.0–0.1)
EOS ABS: 0.2 10*3/uL (ref 0.0–0.7)
EOS PCT: 2 %
HCT: 23.4 % — ABNORMAL LOW (ref 36.0–46.0)
Hemoglobin: 7.5 g/dL — ABNORMAL LOW (ref 12.0–15.0)
LYMPHS ABS: 1.1 10*3/uL (ref 0.7–4.0)
Lymphocytes Relative: 14 %
MCH: 30.9 pg (ref 26.0–34.0)
MCHC: 32.1 g/dL (ref 30.0–36.0)
MCV: 96.3 fL (ref 78.0–100.0)
Monocytes Absolute: 0.4 10*3/uL (ref 0.1–1.0)
Monocytes Relative: 6 %
Neutro Abs: 5.8 10*3/uL (ref 1.7–7.7)
Neutrophils Relative %: 78 %
Platelets: 168 10*3/uL (ref 150–400)
RBC: 2.43 MIL/uL — ABNORMAL LOW (ref 3.87–5.11)
RDW: 15.1 % (ref 11.5–15.5)
WBC: 7.5 10*3/uL (ref 4.0–10.5)

## 2018-01-30 LAB — GLUCOSE, CAPILLARY
GLUCOSE-CAPILLARY: 163 mg/dL — AB (ref 65–99)
GLUCOSE-CAPILLARY: 185 mg/dL — AB (ref 65–99)
GLUCOSE-CAPILLARY: 206 mg/dL — AB (ref 65–99)
Glucose-Capillary: 189 mg/dL — ABNORMAL HIGH (ref 65–99)
Glucose-Capillary: 173 mg/dL — ABNORMAL HIGH (ref 65–99)
Glucose-Capillary: 195 mg/dL — ABNORMAL HIGH (ref 65–99)

## 2018-01-30 LAB — PROTIME-INR
INR: 1.96
Prothrombin Time: 22.2 seconds — ABNORMAL HIGH (ref 11.4–15.2)

## 2018-01-30 LAB — URINE DRUGS OF ABUSE SCREEN W ALC, ROUTINE (REF LAB)
Amphetamines, Urine: NEGATIVE ng/mL
Barbiturate, Ur: NEGATIVE ng/mL
CANNABINOID QUANT UR: NEGATIVE ng/mL
Cocaine (Metab.): NEGATIVE ng/mL
ETHANOL U, QUAN: NEGATIVE %
Methadone Screen, Urine: NEGATIVE ng/mL
Opiate Quant, Ur: NEGATIVE ng/mL
PHENCYCLIDINE, UR: NEGATIVE ng/mL
PROPOXYPHENE, URINE: NEGATIVE ng/mL

## 2018-01-30 LAB — BASIC METABOLIC PANEL
Anion gap: 8 (ref 5–15)
BUN: 16 mg/dL (ref 6–20)
CHLORIDE: 100 mmol/L — AB (ref 101–111)
CO2: 26 mmol/L (ref 22–32)
Calcium: 6.8 mg/dL — ABNORMAL LOW (ref 8.9–10.3)
Creatinine, Ser: 0.78 mg/dL (ref 0.44–1.00)
GFR calc Af Amer: >60 mL/min (ref >60)
GLUCOSE: 213 mg/dL — AB (ref 65–99)
POTASSIUM: 3.5 mmol/L (ref 3.5–5.1)
Sodium: 134 mmol/L — ABNORMAL LOW (ref 135–145)

## 2018-01-30 LAB — DRUG PROFILE 799031
BENZODIAZEPINES: POSITIVE — AB
HYDROXYALPRAZOLAM: NEGATIVE
NORDIAZEPAM GC/MS CONF: 397 ng/mL
NORDIAZEPAM: POSITIVE — AB
OXAZEPAM UR: POSITIVE — AB
Oxazepam Gc/Ms Conf, Ur: 1404 ng/mL

## 2018-01-30 LAB — BLOOD GAS, ARTERIAL
ACID-BASE EXCESS: 3.6 mmol/L — AB (ref 0.0–2.0)
BICARBONATE: 27.7 mmol/L (ref 20.0–28.0)
DRAWN BY: 419771
FIO2: 40
LHR: 35 {breaths}/min
MECHVT: 400 mL
O2 SAT: 95.6 %
PEEP/CPAP: 8 cmH2O
PH ART: 7.428 (ref 7.350–7.450)
PO2 ART: 80.7 mmHg — AB (ref 83.0–108.0)
Patient temperature: 98.6
pCO2 arterial: 42.6 mmHg (ref 32.0–48.0)

## 2018-01-30 LAB — PROCALCITONIN: PROCALCITONIN: 0.49 ng/mL

## 2018-01-30 LAB — TRIGLYCERIDES: TRIGLYCERIDES: 190 mg/dL — AB (ref <150)

## 2018-01-30 LAB — HEPARIN LEVEL (UNFRACTIONATED)
Heparin Unfractionated: <0.1 IU/mL — ABNORMAL LOW (ref 0.30–0.70)
Heparin Unfractionated: <0.1 IU/mL — ABNORMAL LOW (ref 0.30–0.70)
Heparin Unfractionated: <0.1 IU/mL — ABNORMAL LOW (ref 0.30–0.70)

## 2018-01-30 MED ORDER — INSULIN GLARGINE 100 UNIT/ML ~~LOC~~ SOLN
10.0000 [IU] | Freq: Every day | SUBCUTANEOUS | Status: DC
Start: 2018-01-30 — End: 2018-02-01
  Administered 2018-01-30 – 2018-02-01 (×3): 10 [IU] via SUBCUTANEOUS
  Filled 2018-01-30 (×3): qty 0.1

## 2018-01-30 MED ORDER — HEPARIN BOLUS VIA INFUSION
2000.0000 [IU] | Freq: Once | INTRAVENOUS | Status: AC
  Administered 2018-01-30: 2000 [IU] via INTRAVENOUS
  Filled 2018-01-30: qty 2000

## 2018-01-30 MED ORDER — SENNOSIDES 8.8 MG/5ML PO SYRP
5.0000 mL | ORAL_SOLUTION | Freq: Every day | ORAL | Status: DC
  Administered 2018-01-30 – 2018-02-05 (×7): 5 mL
  Filled 2018-01-30 (×8): qty 5

## 2018-01-30 MED ORDER — LACTATED RINGERS IV BOLUS
500.0000 mL | Freq: Once | INTRAVENOUS | Status: AC
  Administered 2018-01-30: 500 mL via INTRAVENOUS

## 2018-01-30 MED ORDER — PROPOFOL 1000 MG/100ML IV EMUL
5.0000 ug/kg/min | INTRAVENOUS | Status: DC
  Administered 2018-01-30: 5 ug/kg/min via INTRAVENOUS
  Administered 2018-01-30: 60 ug/kg/min via INTRAVENOUS
  Administered 2018-01-30: 60.008 ug/kg/min via INTRAVENOUS
  Administered 2018-01-31 (×2): 60 ug/kg/min via INTRAVENOUS
  Administered 2018-01-31: 20 ug/kg/min via INTRAVENOUS
  Filled 2018-01-30 (×7): qty 100

## 2018-01-30 MED ORDER — SODIUM CHLORIDE 0.9 % IV SOLN
2.0000 mg/h | INTRAVENOUS | Status: DC
  Administered 2018-01-31 – 2018-02-01 (×4): 10 mg/h via INTRAVENOUS
  Administered 2018-02-02: 5 mg/h via INTRAVENOUS
  Filled 2018-01-30 (×5): qty 20

## 2018-01-30 MED ORDER — GLYCERIN (LAXATIVE) 2.1 G RE SUPP
1.0000 | Freq: Once | RECTAL | Status: AC
  Administered 2018-01-30: 1 via RECTAL
  Filled 2018-01-30: qty 1

## 2018-01-30 NOTE — Progress Notes (Signed)
PULMONARY / CRITICAL CARE MEDICINE   Name: Natasha Martin MRN: [id number] DOB: 07-13-1961    ADMISSION DATE:  01/27/2018 CONSULTATION DATE:  01/27/2018  HISTORY OF PRESENT ILLNESS:   Essentially no immediate history on this 57 year old with diabetes felt mental delay and schizophrenia who also has a reported history of seizures in the chart. For the past 2-3 days she has been having a productive cough and she was found in the bathroom this morning unresponsive. When EMS arrived her saturations were in the 20s. She was taking to [facility] where she was intubated and chest x-ray has shown diffuse bilateral infiltrates.Admitted3/30/2019  She has a past medical history of Anxiety, Diabetes (HCC), Dysphagia, Hyperlipidemia, Hypertension, Mild mental slowing, Poor historian, Schizophrenia (HCC), and Seizures (HCC).has a past surgical history that includes Leg Surgery (Bilateral); Colonoscopy with propofol (N/A, 01/28/2014); Esophagogastroduodenoscopy (egd) with propofol (N/A, 01/28/2014); Savory dilation (N/A, 01/28/2014); polypectomy (01/28/2014); and biopsy (01/28/2014).reports that she has never smoked. She has never used smokeless tobacco.  SUBJECTIVE:  Intubated and sedated. On nimbex. Did not tolerate lower tidal volume yesterday with worsened ABG and CO2 retention. Had an episode of hypotension following lasix dose and required fluid bolus with improvement in BP. Nursing staff notes asynchrony with vent when trying to stop nimbex overnight.   VITAL SIGNS: BP (!) 173/67   Pulse 88   Temp 97.7 F (36.5 C)   Resp (!) 35   Ht 5\' 2"  (1.575 m)   Wt 189 lb 13.1 oz (86.1 kg)   LMP 01/21/2014   SpO2 100%   BMI 34.72 kg/m   HEMODYNAMICS: CVP:  [8 mmHg] 8 mmHg  VENTILATOR SETTINGS: Vent Mode: PRVC FiO2 (%):  [40 %-50 %] 40 % Set Rate:  [28 bmp-35 bmp] 35 bmp Vt Set:  [300 mL-450 mL] 400 mL PEEP:  [8 cmH20-10 cmH20] 8 cmH20 Plateau Pressure:  [24 cmH20-29 cmH20] 27  cmH20  INTAKE / OUTPUT: I/O last 3 completed shifts: In: 7282 [I.V.:4200.6; NG/GT:1697.5; IV Piggyback:1383.9] Out: 3825 [Urine:3825]  PHYSICAL EXAMINATION: Gen:       No acute distress HEENT:  EOMI, sclera anicteric Neck:     No masses; no thyromegaly, ETT Lungs:    Clear to auscultation bilaterally; normal respiratory effort CV:         Regular rate and rhythm; no murmurs Abd:        + bowel sounds; soft, non-tender; no palpable masses, no distension Ext:         No edema; adequate peripheral perfusion Skin:       Warm and dry; no rash Neuro:    paralysed, unresponsive  LABS:  BMET Recent Labs  Lab 01/28/18 0920 01/29/18 0200 01/30/18 0403  NA 129* 131* 134*  K 3.8 3.5 3.5  CL 100* 101 100*  CO2 24 22 26   BUN 6 11 16   CREATININE 0.73 0.80 0.78  GLUCOSE 124* 192* 213*    Electrolytes Recent Labs  Lab 01/28/18 0920 01/28/18 1448 01/29/18 0200 01/29/18 1657 01/30/18 0403  CALCIUM 6.9*  --  6.8*  --  6.8*  MG 1.5* 2.3 1.8 1.6*  --   PHOS 2.3* 2.2* 2.5 2.7  --     CBC Recent Labs  Lab 01/28/18 0920 01/29/18 0200 01/30/18 0403  WBC 10.9* 8.3 7.5  HGB 8.7* 7.5* 7.5*  HCT 26.7* 23.4* 23.4*  PLT 176 148* 168    Coag's Recent Labs  Lab 01/28/18 0920 01/29/18 0545 01/30/18 0251  INR 2.15 2.06 1.96  Sepsis Markers Recent Labs  Lab 01/27/18 1050 01/27/18 2012 01/28/18 0920 01/29/18 0200 01/30/18 0403  LATICACIDVEN 4.5* 1.3 0.8  --   --   PROCALCITON  --   --  0.40 0.37 0.49    ABG Recent Labs  Lab 01/29/18 1352 01/29/18 1605 01/30/18 0400  PHART 7.105* 7.339* 7.428  PCO2ART 90.2* 50.2* 42.6  PO2ART 79.0* 133.0* 80.7*    Liver Enzymes Recent Labs  Lab 01/27/18 0854 01/28/18 0920 01/29/18 0200  AST 61* 82* 38  ALT 59* 84* 58*  ALKPHOS 125 108 93  BILITOT 1.4* 1.7* 1.1  ALBUMIN 2.9* 1.9* 1.6*    Cardiac Enzymes Recent Labs  Lab 01/28/18 0920 01/28/18 1449 01/29/18 0200  TROPONINI <0.03 <0.03 <0.03  <0.03     Glucose Recent Labs  Lab 01/29/18 0756 01/29/18 1203 01/29/18 1552 01/29/18 2001 01/29/18 2357 01/30/18 0316  GLUCAP 154* 139* 246* 194* 171* 206*    Imaging Dg Chest Port 1 View  Result Date: 01/30/2018 CLINICAL DATA:  Followup ARDS.  Ventilator support. EXAM: PORTABLE CHEST 1 VIEW COMPARISON:  01/29/2018. FINDINGS: Endotracheal tube tip is 3 cm above the carina. Nasogastric tube enters the abdomen. Right internal jugular central line tip is in the SVC above the right atrium. Persistent bilateral lower lobe volume loss and infiltrate, with worsened consolidation in the left lower lobe. Small amount of pleural fluid probably present. IMPRESSION: Worsened consolidation/volume loss in the left lower lobe. Persistent bilateral infiltrates. Electronically Signed   By: Paulina Fusi M.D.   On: 01/30/2018 07:07   STUDIES:  Strep Pneumo - negative Legionella UrAg - pending Flu panel - negative RSVP - negative  CULTURES: 01/27/2018 Blood Culture: NG at 2 days 01/28/2018 Tracheal Aspirate: Rare G- cocci, reincubated  ANTIBIOTICS: Azithromycin 3/30 >> Vancomycin 3/30 >> 4/1 Zosyn 3/30 >>  SIGNIFICANT EVENTS: 01/27/2018: Transferred from [facility]; intubated; CXR with bilateral infiltrates 01/28/2018: ARDS protocol; Nimbex started  LINES/TUBES: RIJ 3/30 >> L Femoral A-line 3/30 >>   ASSESSMENT / PLAN:  PULMONARY #baseline - no known pulmonary issues #current:Admitted with ARDS.  Did not tolerate reduced tidal volume 6 cc/kg (300 cc) yesterday with worsened ABG and CO2 retention. Improved after increasing to 400 cc. Able to come down on PEEP from 10 > 8. ABG this morning 7.42/42/80/27 with vent settings PRVC Rate 35 PEEP 8 Peak Pressure 31 Plateau Pressure 17 and FiO2 40%.   P: Attempt to wean tidal volumes to ARDS protocol  RASS goal should be -4/-3 Check ABG daily  Continue Nimbex, consider discontinuing this afternoon is improving  CARDIOVASCULAR #Baseline -  Known A Fib patient . ? on amio, lipitor, tricor lopresso, omega 3, coumadin at home. Last echo 2016 #Admit - Circulatory shock at admit with fever (RVP and urine strep engative). Initially on levo but weaned off.  Received lasix dose for concern for cardiac etiology. Had good UOP but had drop in BP requiring fluid bolus. BP has been stable since that time and not requiring any pressors. ECHO returned with aortic and mitral valve disease. Cardiology consulted yesterday, appreciate their assistance. Do not recommend any further diuresis and further workup deferred to outpatient setting.  P: CVP 7 this morning. Goal 8-12 On heparin gtt   RENAL #baseline:Normal renal function #currentAt admit - normal renal function AT risk for AKI given ARDS  P: Follow labs. Cr remains stable today.   GASTROINTESTINAL On PPI at home Currently NPO  P: contnue NPO Continue TFs PPI  HEMATOLOGIC A:  #RBC:mild  anemia of critical illness (admit hgb 3.5 spurious) - stable at 7.5 #Plateletnormal #Leukocytosis resolved  P: - PRBC for hgb </= 6.9gm%   INFECTIOUS Likely sepstic shock based on fever and presentation. Source probably lung. On Zosyn and Azitrho. Vanc d/c'd. P: Continue ABX   ENDOCRINE DM on glybrudie, insulin, metformin at home AT risk hypegluycemia  P: ICU hyperglycemia protocol  NEUROLOGIC #Baseline :Psych hx + - ? on trazadone, seroquel, vicodine, tegretol , cogentin, and tylonl # 3 at home #Current:admit for ARDS   P: RASS goal:-4 in ARDS    FAMILY  - Updates: No family at bedside    Pulmonary and Critical Care Medicine [facility] Pager: [phone]  01/30/2018, 7:43 AM

## 2018-01-30 NOTE — Progress Notes (Signed)
ANTICOAGULATION CONSULT NOTE  Pharmacy Consult for Heparin (warfarin on hold) Indication: atrial fibrillation  Allergies  Allergen Reactions  . Sulfa Antibiotics Rash    Patient Measurements: Height: 5\' 2"  (157.5 cm) Weight: 187 lb 9.8 oz (85.1 kg) IBW/kg (Calculated) : 50.1 Heparin Dosing Weight: 68 kg   Vital Signs: Temp: 99.2 F (37.3 C) (04/02 0316) Temp Source: Oral (04/02 0316) BP: 103/48 (04/02 0300) Pulse Rate: 61 (04/02 0300)  Labs: Recent Labs    01/28/18 0015 01/28/18 0920 01/28/18 1449 01/28/18 1805 01/29/18 0200 01/29/18 0545 01/29/18 2358 01/30/18 0251  HGB 7.9* 8.7*  --   --  7.5*  --   --   --   HCT 23.5* 26.7*  --   --  23.4*  --   --   --   PLT 137* 176  --   --  148*  --   --   --   LABPROT  --  23.8*  --   --   --  23.0*  --  22.2*  INR  --  2.15  --   --   --  2.06  --  1.96  HEPARINUNFRC  --   --   --  <0.10*  --   --  <0.10*  --   CREATININE 0.80 0.73  --   --  0.80  --   --   --   TROPONINI <0.03 <0.03 <0.03  --  <0.03  <0.03  --   --   --     Estimated Creatinine Clearance: 81.3 mL/min (by C-G formula based on SCr of 0.8 mg/dL).   Medical History: Past Medical History:  Diagnosis Date  . Anxiety   . Diabetes (HCC)   . Dysphagia   . Hyperlipidemia   . Hypertension   . Mild mental slowing   . Poor historian   . Schizophrenia (HCC)   . Seizures (HCC)    has not had a seizure since last year. Unknown orgin- no family available to come and patient does not know.    Assessment: [age] YOF with a history of AFib on warfarin at home found unresponsive at home. Pharmacy consulted to start IV heparin bridge. INR on admit was 1.85 and heparin was initiated 3/31 then discontinued as INR rose to 2.15. Of note, heparin was subtherapeutic at 900 units/hr.   4/2 AM: heparin level low, INR 1.96  Goal of Therapy:  Heparin level 0.3-0.7 units/ml Monitor platelets by anticoagulation protocol: Yes   Plan:  -Inc heparin to 1150 units/hr -1200  HL  Abran DukeJames Patty Leitzke, PharmD, BCPS Clinical Pharmacist Phone: [phone]

## 2018-01-30 NOTE — Progress Notes (Signed)
ANTICOAGULATION CONSULT NOTE - Follow Up Consult  Pharmacy Consult for heparin  Indication: atrial fibrillation  Allergies  Allergen Reactions  . Sulfa Antibiotics Rash    Patient Measurements: Height: 5\' 2"  (157.5 cm) Weight: 189 lb 13.1 oz (86.1 kg) IBW/kg (Calculated) : 50.1 Heparin Dosing Weight: 68 kg  Vital Signs: Temp: 98.8 F (37.1 C) (04/02 1300) Temp Source: Core (04/02 0800) BP: 154/133 (04/02 1300) Pulse Rate: 85 (04/02 1300)  Labs: Recent Labs    01/28/18 0920 01/28/18 1449 01/28/18 1805 01/29/18 0200 01/29/18 0545 01/29/18 2358 01/30/18 0251 01/30/18 0403  HGB 8.7*  --   --  7.5*  --   --   --  7.5*  HCT 26.7*  --   --  23.4*  --   --   --  23.4*  PLT 176  --   --  148*  --   --   --  168  LABPROT 23.8*  --   --   --  23.0*  --  22.2*  --   INR 2.15  --   --   --  2.06  --  1.96  --   HEPARINUNFRC  --   --  <0.10*  --   --  <0.10*  --   --   CREATININE 0.73  --   --  0.80  --   --   --  0.78  TROPONINI <0.03 <0.03  --  <0.03  <0.03  --   --   --   --     Estimated Creatinine Clearance: 81.9 mL/min (by C-G formula based on SCr of 0.78 mg/dL).   Medications:  Heparin 1150 units/hr  Assessment: 89 YOF with a history of AFib on warfarin at home found unresponsive at home. Pharmacy consulted to start IV heparin bridge. INR this morning 1.96. Heparin level low this morning, dose increased, repeat level <0.10. CBC low but stable. No overt signs of bleeding noted by RN.   Goal of Therapy:  Heparin level 0.3-0.7 units/ml Monitor platelets by anticoagulation protocol: Yes   Plan:  -Heparin bolus 2000 units x1 -Increase heparin to 1350 units/hr -Recheck 6hr HL -Daily HL and CBC  GrenadaBrittany Davey Bergsma 01/30/2018,1:37 PM

## 2018-01-30 NOTE — Progress Notes (Signed)
ANTICOAGULATION CONSULT NOTE - Follow Up Consult  Pharmacy Consult for heparin  Indication: atrial fibrillation  Allergies  Allergen Reactions  . Sulfa Antibiotics Rash    Patient Measurements: Height: 5\' 2"  (157.5 cm) Weight: 189 lb 13.1 oz (86.1 kg) IBW/kg (Calculated) : 50.1 Heparin Dosing Weight: 68 kg  Vital Signs: Temp: 98.8 F (37.1 C) (04/02 2100) Temp Source: Core (04/02 1600) BP: 89/46 (04/02 2100) Pulse Rate: 54 (04/02 2100)  Labs: Recent Labs    01/28/18 0920 01/28/18 1449  01/29/18 0200 01/29/18 0545 01/29/18 2358 01/30/18 0251 01/30/18 0403 01/30/18 1233 01/30/18 2100  HGB 8.7*  --   --  7.5*  --   --   --  7.5*  --   --   HCT 26.7*  --   --  23.4*  --   --   --  23.4*  --   --   PLT 176  --   --  148*  --   --   --  168  --   --   LABPROT 23.8*  --   --   --  23.0*  --  22.2*  --   --   --   INR 2.15  --   --   --  2.06  --  1.96  --   --   --   HEPARINUNFRC  --   --    < >  --   --  <0.10*  --   --  <0.10* <0.10*  CREATININE 0.73  --   --  0.80  --   --   --  0.78  --   --   TROPONINI <0.03 <0.03  --  <0.03  <0.03  --   --   --   --   --   --    < > = values in this interval not displayed.    Estimated Creatinine Clearance: 81.9 mL/min (by C-G formula based on SCr of 0.78 mg/dL).     Assessment: [age] YOF with a history of AFib on warfarin at home found unresponsive at home. Pharmacy consulted dose IV heparin bridge.  -initial heparin level is undetectable on 1350 units/hr -INR noted as 1.96  Goal of Therapy:  Heparin level 0.3-0.7 units/ml Monitor platelets by anticoagulation protocol: Yes   Plan:  -Increase heparin to 1550 units/hr -Recheck 6hr HL -Daily HL and CBC  Harland GermanAndrew Jaeanna Mccomber, PharmD 01/30/2018 10:12 PM

## 2018-01-30 NOTE — Progress Notes (Signed)
Pt's BP 86/39, CVP 5, Dr. Isaiah SergeMannam called, and 500cc bolus given per order.

## 2018-01-31 ENCOUNTER — Inpatient Hospital Stay (HOSPITAL_COMMUNITY): Payer: Medicare Other

## 2018-01-31 LAB — GLUCOSE, CAPILLARY
GLUCOSE-CAPILLARY: 165 mg/dL — AB (ref 65–99)
GLUCOSE-CAPILLARY: 154 mg/dL — AB (ref 65–99)
Glucose-Capillary: 134 mg/dL — ABNORMAL HIGH (ref 65–99)
Glucose-Capillary: 206 mg/dL — ABNORMAL HIGH (ref 65–99)
Glucose-Capillary: 150 mg/dL — ABNORMAL HIGH (ref 65–99)

## 2018-01-31 LAB — CBC WITH DIFFERENTIAL/PLATELET
Basophils Absolute: 0 10*3/uL (ref 0.0–0.1)
Basophils Relative: 0 %
Eosinophils Absolute: 0.2 10*3/uL (ref 0.0–0.7)
Eosinophils Relative: 3 %
HEMATOCRIT: 23.1 % — AB (ref 36.0–46.0)
HEMOGLOBIN: 7.4 g/dL — AB (ref 12.0–15.0)
LYMPHS ABS: 1.2 10*3/uL (ref 0.7–4.0)
Lymphocytes Relative: 19 %
MCH: 30.5 pg (ref 26.0–34.0)
MCHC: 32 g/dL (ref 30.0–36.0)
MCV: 95.1 fL (ref 78.0–100.0)
MONOS PCT: 6 %
Monocytes Absolute: 0.4 10*3/uL (ref 0.1–1.0)
NEUTROS ABS: 4.6 10*3/uL (ref 1.7–7.7)
Neutrophils Relative %: 72 %
Platelets: 181 10*3/uL (ref 150–400)
RBC: 2.43 MIL/uL — ABNORMAL LOW (ref 3.87–5.11)
RDW: 15.1 % (ref 11.5–15.5)
WBC: 6.4 10*3/uL (ref 4.0–10.5)

## 2018-01-31 LAB — POCT I-STAT 3, ART BLOOD GAS (G3+)
ACID-BASE EXCESS: 2 mmol/L (ref 0.0–2.0)
Bicarbonate: 26.8 mmol/L (ref 20.0–28.0)
O2 SAT: 97 %
PH ART: 7.437 (ref 7.350–7.450)
TCO2: 28 mmol/L (ref 22–32)
pCO2 arterial: 39.6 mmHg (ref 32.0–48.0)
pO2, Arterial: 82 mmHg — ABNORMAL LOW (ref 83.0–108.0)

## 2018-01-31 LAB — BASIC METABOLIC PANEL
Anion gap: 7 (ref 5–15)
BUN: 11 mg/dL (ref 6–20)
CALCIUM: 7 mg/dL — AB (ref 8.9–10.3)
CO2: 25 mmol/L (ref 22–32)
CREATININE: 0.72 mg/dL (ref 0.44–1.00)
Chloride: 106 mmol/L (ref 101–111)
GFR calc Af Amer: >60 mL/min (ref >60)
GLUCOSE: 198 mg/dL — AB (ref 65–99)
POTASSIUM: 3.5 mmol/L (ref 3.5–5.1)
SODIUM: 138 mmol/L (ref 135–145)

## 2018-01-31 LAB — TYPE AND SCREEN
ABO/RH(D): A POS
ANTIBODY SCREEN: NEGATIVE
UNIT DIVISION: 0
UNIT DIVISION: 0

## 2018-01-31 LAB — CULTURE, RESPIRATORY

## 2018-01-31 LAB — HEPARIN LEVEL (UNFRACTIONATED)
HEPARIN UNFRACTIONATED: 0.2 [IU]/mL — AB (ref 0.30–0.70)
HEPARIN UNFRACTIONATED: 0.14 [IU]/mL — AB (ref 0.30–0.70)
HEPARIN UNFRACTIONATED: 0.27 [IU]/mL — AB (ref 0.30–0.70)

## 2018-01-31 LAB — BPAM RBC
BLOOD PRODUCT EXPIRATION DATE: 201904252359
BLOOD PRODUCT EXPIRATION DATE: 201904252359
UNIT TYPE AND RH: 6200
UNIT TYPE AND RH: 6200

## 2018-01-31 LAB — CULTURE, RESPIRATORY W GRAM STAIN

## 2018-01-31 MED ORDER — ETOMIDATE 2 MG/ML IV SOLN
INTRAVENOUS | Status: AC
  Administered 2018-01-31: 20 mg via INTRAVENOUS
  Filled 2018-01-31: qty 10

## 2018-01-31 MED ORDER — HEPARIN BOLUS VIA INFUSION
2000.0000 [IU] | Freq: Once | INTRAVENOUS | Status: AC
  Administered 2018-01-31: 2000 [IU] via INTRAVENOUS
  Filled 2018-01-31: qty 2000

## 2018-01-31 MED ORDER — ALBUMIN HUMAN 5 % IV SOLN
12.5000 g | Freq: Once | INTRAVENOUS | Status: AC
  Administered 2018-01-31: 12.5 g via INTRAVENOUS
  Filled 2018-01-31: qty 250

## 2018-01-31 MED ORDER — LACTULOSE 10 GM/15ML PO SOLN
10.0000 g | Freq: Every day | ORAL | Status: DC
  Administered 2018-01-31: 10 g
  Filled 2018-01-31 (×2): qty 15

## 2018-01-31 MED ORDER — ETOMIDATE 2 MG/ML IV SOLN
20.0000 mg | Freq: Once | INTRAVENOUS | Status: AC
  Administered 2018-01-31: 20 mg via INTRAVENOUS

## 2018-01-31 MED ORDER — FUROSEMIDE 10 MG/ML IJ SOLN
20.0000 mg | Freq: Once | INTRAMUSCULAR | Status: AC
  Administered 2018-01-31: 20 mg via INTRAVENOUS
  Filled 2018-01-31: qty 2

## 2018-01-31 NOTE — Procedures (Signed)
Bronchoscopy Procedure Note Natasha Martin [id number] 20-Oct-1961  Procedure: Bronchoscopy Indications: Diagnostic evaluation of the airways  Procedure Details Consent: Risks of procedure as well as the alternatives and risks of each were explained to the (patient/caregiver).  Consent for procedure obtained. Time Out: Verified patient identification, verified procedure, site/side was marked, verified correct patient position, special equipment/implants available, medications/allergies/relevent history reviewed, required imaging and test results available.  Performed  In preparation for procedure, patient was given 100% FiO2 and bronchoscope lubricated. Sedation: Benzodiazepines and Etomidate  Airway entered and the following bronchi were examined: RUL, RML, RLL, LUL, LLL and Bronchi.  Mucus plugs notes in RLL which was suctioned out. Procedures performed: Bronchial washings in RLL Bronchoscope removed.    Evaluation Hemodynamic Status: BP stable throughout; O2 sats: stable throughout Patient's Current Condition: stable Specimens:  Sent purulent fluid Complications: No apparent complications Patient did tolerate procedure well.  Admir Candelas 01/31/2018

## 2018-01-31 NOTE — Progress Notes (Signed)
PULMONARY / CRITICAL CARE MEDICINE   Name: Natasha Martin MRN: [id number] DOB: May 04, 1961    ADMISSION DATE:  01/27/2018 CONSULTATION DATE:  01/27/2018  HISTORY OF PRESENT ILLNESS:   Essentially no immediate history on this 57 year old with diabetes felt mental delay and schizophrenia who also has a reported history of seizures in the chart. For the past 2-3 days she has been having a productive cough and she was found in the bathroom this morning unresponsive. When EMS arrived her saturations were in the 20s. She was taking to [facility] where she was intubated and chest x-ray has shown diffuse bilateral infiltrates.Admitted3/30/2019  She has a past medical history of Anxiety, Diabetes (HCC), Dysphagia, Hyperlipidemia, Hypertension, Mild mental slowing, Poor historian, Schizophrenia (HCC), and Seizures (HCC).has a past surgical history that includes Leg Surgery (Bilateral); Colonoscopy with propofol (N/A, 01/28/2014); Esophagogastroduodenoscopy (egd) with propofol (N/A, 01/28/2014); Savory dilation (N/A, 01/28/2014); polypectomy (01/28/2014); and biopsy (01/28/2014).reports that she has never smoked. She has never used smokeless tobacco.  SUBJECTIVE:  Intubated and sedated. Discontinued nimbex yesterday. She became agitated and asynchronous with the vent and required propofol for sedation in addition to her fentanyl and versed. Developed hypotension overnight with CVP 10 and low dose levo was re-started.   VITAL SIGNS: BP (!) 102/48   Pulse (!) 48   Temp (!) 96.6 F (35.9 C)   Resp (!) 35   Ht 5\' 2"  (1.575 m)   Wt 197 lb 5 oz (89.5 kg)   LMP 01/21/2014   SpO2 99%   BMI 36.09 kg/m   HEMODYNAMICS: CVP:  [5 mmHg-10 mmHg] 10 mmHg  VENTILATOR SETTINGS: Vent Mode: PRVC FiO2 (%):  [40 %] 40 % Set Rate:  [35 bmp] 35 bmp Vt Set:  [400 mL] 400 mL PEEP:  [8 cmH20] 8 cmH20 Plateau Pressure:  [26 cmH20-28 cmH20] 28 cmH20  INTAKE / OUTPUT: I/O last 3 completed shifts: In:  5550.9 [I.V.:2980.9; NG/GT:1820; IV Piggyback:750] Out: 2270 [Urine:2270]  PHYSICAL EXAMINATION: Gen:       No acute distress HEENT:  EOMI, sclera anicteric Neck:     No masses; no thyromegaly, ETT Lungs:    Clear to auscultation bilaterally; normal respiratory effort CV:         Regular rate and rhythm; no murmurs Abd:        + bowel sounds; soft, non-tender; no palpable masses, no distension Ext:         No edema; adequate peripheral perfusion Skin:       Warm and dry; no rash Neuro:    Sedated, unresponsive  LABS:  BMET Recent Labs  Lab 01/28/18 0920 01/29/18 0200 01/30/18 0403  NA 129* 131* 134*  K 3.8 3.5 3.5  CL 100* 101 100*  CO2 24 22 26   BUN 6 11 16   CREATININE 0.73 0.80 0.78  GLUCOSE 124* 192* 213*    Electrolytes Recent Labs  Lab 01/28/18 0920 01/28/18 1448 01/29/18 0200 01/29/18 1657 01/30/18 0403  CALCIUM 6.9*  --  6.8*  --  6.8*  MG 1.5* 2.3 1.8 1.6*  --   PHOS 2.3* 2.2* 2.5 2.7  --     CBC Recent Labs  Lab 01/29/18 0200 01/30/18 0403 01/31/18 0421  WBC 8.3 7.5 6.4  HGB 7.5* 7.5* 7.4*  HCT 23.4* 23.4* 23.1*  PLT 148* 168 181    Coag's Recent Labs  Lab 01/28/18 0920 01/29/18 0545 01/30/18 0251  INR 2.15 2.06 1.96    Sepsis Markers Recent Labs  Lab 01/27/18  1050 01/27/18 2012 01/28/18 0920 01/29/18 0200 01/30/18 0403  LATICACIDVEN 4.5* 1.3 0.8  --   --   PROCALCITON  --   --  0.40 0.37 0.49    ABG Recent Labs  Lab 01/29/18 1352 01/29/18 1605 01/30/18 0400  PHART 7.105* 7.339* 7.428  PCO2ART 90.2* 50.2* 42.6  PO2ART 79.0* 133.0* 80.7*    Liver Enzymes Recent Labs  Lab 01/27/18 0854 01/28/18 0920 01/29/18 0200  AST 61* 82* 38  ALT 59* 84* 58*  ALKPHOS 125 108 93  BILITOT 1.4* 1.7* 1.1  ALBUMIN 2.9* 1.9* 1.6*    Cardiac Enzymes Recent Labs  Lab 01/28/18 0920 01/28/18 1449 01/29/18 0200  TROPONINI <0.03 <0.03 <0.03  <0.03    Glucose Recent Labs  Lab 01/30/18 0820 01/30/18 1146 01/30/18 1542  01/30/18 1938 01/30/18 2358 01/31/18 0408  GLUCAP 185* 189* 173* 195* 163* 165*    Imaging [place] Ekg Site Rite  Result Date: 01/30/2018 If Site Rite image not attached, placement could not be confirmed due to current cardiac rhythm.  STUDIES:  Strep Pneumo - negative Flu panel - negative RSVP - negative  CULTURES: 01/27/2018 Blood Culture: NG at 3 days 01/28/2018 Tracheal Aspirate: Rare G- cocci, reincubated  ANTIBIOTICS: Azithromycin 3/30 >> Vancomycin 3/30 >> 4/1 Zosyn 3/30 >>  SIGNIFICANT EVENTS: 01/27/2018: Transferred from [facility]; intubated; CXR with bilateral infiltrates 01/28/2018: ARDS protocol; Nimbex started 01/30/2018: Nimbex stopped; required propofol > drop in BP and levo started  LINES/TUBES: RIJ 3/30 >> L Femoral A-line 3/30 >>  ASSESSMENT / PLAN:  PULMONARY #baseline - no known pulmonary issues #current:Admitted with ARDS.  Nimbex was stopped yesterday. She became asynchronous with the vent and required further sedation with propofol. She subsequently developed hypotension and has been place back on levo. Vent settings stable from yesterday; Rate 35, TV 400, PEEP 8, Peak 31, Plateau 28, FiO2 40%. CXR and ABG pending this morning.   P: Consider bronchoscopy this afternoon if CXR worsening Consider trial of steroids Continue Zosyn and Azitrho RASS goal should be -4/-3 Check ABG daily   CARDIOVASCULAR #Baseline - Known A Fib patient . ? on amio, lipitor, tricor lopresso, omega 3, coumadin at home. Last echo 2016 #Admit - Circulatory shock at admit with fever (RVP and urine strep engative). Initially on levo but weaned off.  # Aortic and mitral valve disease - cardiology consulted, no work up this hospitalization recommended Developed hypotension overnight after starting propofol. Back on levo.  P: Wean Levo as able.  CVP 10 this morning. Goal 8-12 On heparin gtt   RENAL #baseline:Normal renal function #currentAt admit - normal  renal function AT risk for AKI given ARDS  P: Follow labs. Cr remains stable.   GASTROINTESTINAL On PPI at home Currently NPO  P: contnue NPO Continue TFs PPI  HEMATOLOGIC A:  #RBC:mild anemia of critical illness (admit hgb 3.5 spurious) - stable at 7.4 #Plateletnormal #Leukocytosis resolved  P: - PRBC for hgb </= 6.9gm%   INFECTIOUS Likely sepstic shock based on fever and presentation. Source probably lung. On Zosyn and Azitrho. Vanc d/c'd. P: Continue ABX   ENDOCRINE DM on glybrudie, insulin, metformin at home AT risk hypegluycemia  P: Continue lantus ICU hyperglycemia protocol  NEUROLOGIC #Baseline :Psych hx + - ? on trazadone, seroquel, vicodine, tegretol , cogentin, and tylonl # 3 at home #Current:admit for ARDS   P: RASS goal:-4 in ARDS    FAMILY  - Updates: Sister updated at bedside    Pulmonary and Critical Care Medicine  [facility] Pager: [phone]  01/31/2018, 7:01 AM

## 2018-01-31 NOTE — Progress Notes (Signed)
ANTICOAGULATION CONSULT NOTE - Follow Up Consult  Pharmacy Consult for heparin Indication: atrial fibrillation  Allergies  Allergen Reactions  . Sulfa Antibiotics Rash    Patient Measurements: Height: 5\' 2"  (157.5 cm) Weight: 197 lb 5 oz (89.5 kg) IBW/kg (Calculated) : 50.1 Heparin Dosing Weight: 68 kg  Vital Signs: Temp: 99.1 F (37.3 C) (04/03 1200) Temp Source: Core (04/03 1200) BP: 95/45 (04/03 1200) Pulse Rate: 57 (04/03 1200)  Labs: Recent Labs    01/28/18 1449  01/29/18 0200 01/29/18 0545  01/30/18 0251 01/30/18 0403 01/30/18 1233 01/30/18 2100 01/31/18 0421  HGB  --    < > 7.5*  --   --   --  7.5*  --   --  7.4*  HCT  --   --  23.4*  --   --   --  23.4*  --   --  23.1*  PLT  --   --  148*  --   --   --  168  --   --  181  LABPROT  --   --   --  23.0*  --  22.2*  --   --   --   --   INR  --   --   --  2.06  --  1.96  --   --   --   --   HEPARINUNFRC  --    < >  --   --    < >  --   --  <0.10* <0.10* 0.20*  CREATININE  --   --  0.80  --   --   --  0.78  --   --   --   TROPONINI <0.03  --  <0.03  <0.03  --   --   --   --   --   --   --    < > = values in this interval not displayed.    Estimated Creatinine Clearance: 83.6 mL/min (by C-G formula based on SCr of 0.78 mg/dL).   Medications:  Heparin 1700 units/hr  Assessment: [age] YOF with a history of AFib on warfarin at home found unresponsive at home. Pharmacy consulted to start IV heparin bridge. Heparin level low this morning at 0.20, dose increased, repeat level was 0.14. CBC low but stable. No overt signs of bleeding noted by RN.   Goal of Therapy:  Heparin level 0.3-0.7 units/ml Monitor platelets by anticoagulation protocol: Yes   Plan:  -Heparin bolus 2000 units x 1 -Increase heparin to 2000 units/hr -Recheck 8hr HL -Daily HL and CBC  GrenadaBrittany Shakhia Gramajo 01/31/2018,12:53 PM

## 2018-01-31 NOTE — Progress Notes (Signed)
ANTICOAGULATION CONSULT NOTE  Pharmacy Consult for Heparin (warfarin on hold) Indication: atrial fibrillation  Allergies  Allergen Reactions  . Sulfa Antibiotics Rash   Patient Measurements: Height: 5\' 2"  (157.5 cm) Weight: 197 lb 5 oz (89.5 kg) IBW/kg (Calculated) : 50.1 Heparin Dosing Weight: 68 kg   Vital Signs: Temp: 99.3 F (37.4 C) (04/03 1516) Temp Source: Core (04/03 1600) BP: 107/48 (04/03 2200) Pulse Rate: 63 (04/03 2200)  Labs: Recent Labs    01/29/18 0200 01/29/18 0545  01/30/18 0251 01/30/18 0403  01/31/18 0421 01/31/18 1234 01/31/18 2114  HGB 7.5*  --   --   --  7.5*  --  7.4*  --   --   HCT 23.4*  --   --   --  23.4*  --  23.1*  --   --   PLT 148*  --   --   --  168  --  181  --   --   LABPROT  --  23.0*  --  22.2*  --   --   --   --   --   INR  --  2.06  --  1.96  --   --   --   --   --   HEPARINUNFRC  --   --    < >  --   --    < > 0.20* 0.14* 0.27*  CREATININE 0.80  --   --   --  0.78  --   --  0.72  --   TROPONINI <0.03  <0.03  --   --   --   --   --   --   --   --    < > = values in this interval not displayed.    Estimated Creatinine Clearance: 83.6 mL/min (by C-G formula based on SCr of 0.72 mg/dL).   Medical History: Past Medical History:  Diagnosis Date  . Anxiety   . Diabetes (HCC)   . Dysphagia   . Hyperlipidemia   . Hypertension   . Mild mental slowing   . Poor historian   . Schizophrenia (HCC)   . Seizures (HCC)    has not had a seizure since last year. Unknown orgin- no family available to come and patient does not know.    Assessment: Natasha Martin with a history of AFib on warfarin at home found unresponsive at home. Pharmacy consulted to start IV heparin bridge. INR on admit was 1.85 and heparin was initiated 3/31 then discontinued as INR rose to 2.15. Of note, heparin was subtherapeutic at 900 units/hr.   Heparin level this PM is sub-therapeutic at 0.27 on 2000 units/hr.  No bleeding or issues with the line per RN.  Goal of  Therapy:  Heparin level 0.3-0.7 units/ml Monitor platelets by anticoagulation protocol: Yes   Plan:  Increase Heparin to 2150 units/hr. Recheck Heparin level with daily labs.   Link SnufferJessica Maurissa Ambrose, PharmD, BCPS, BCCCP Clinical Pharmacist Clinical phone 01/31/2018 until 11PM [phone]- [phone] After hours, please call #[phone] 01/31/2018, 10:32 PM

## 2018-01-31 NOTE — Progress Notes (Signed)
ANTICOAGULATION CONSULT NOTE  Pharmacy Consult for Heparin (warfarin on hold) Indication: atrial fibrillation  Allergies  Allergen Reactions  . Sulfa Antibiotics Rash   Patient Measurements: Height: 5\' 2"  (157.5 cm) Weight: 197 lb 5 oz (89.5 kg) IBW/kg (Calculated) : 50.1 Heparin Dosing Weight: 68 kg   Vital Signs: Temp: 96.6 F (35.9 C) (04/03 0500) BP: 98/47 (04/03 0400) Pulse Rate: 48 (04/03 0500)  Labs: Recent Labs    01/28/18 0920 01/28/18 1449  01/29/18 0200 01/29/18 0545  01/30/18 0251 01/30/18 0403 01/30/18 1233 01/30/18 2100 01/31/18 0421  HGB 8.7*  --   --  7.5*  --   --   --  7.5*  --   --  7.4*  HCT 26.7*  --   --  23.4*  --   --   --  23.4*  --   --  23.1*  PLT 176  --   --  148*  --   --   --  168  --   --  181  LABPROT 23.8*  --   --   --  23.0*  --  22.2*  --   --   --   --   INR 2.15  --   --   --  2.06  --  1.96  --   --   --   --   HEPARINUNFRC  --   --    < >  --   --    < >  --   --  <0.10* <0.10* 0.20*  CREATININE 0.73  --   --  0.80  --   --   --  0.78  --   --   --   TROPONINI <0.03 <0.03  --  <0.03  <0.03  --   --   --   --   --   --   --    < > = values in this interval not displayed.    Estimated Creatinine Clearance: 83.6 mL/min (by C-G formula based on SCr of 0.78 mg/dL).   Medical History: Past Medical History:  Diagnosis Date  . Anxiety   . Diabetes (HCC)   . Dysphagia   . Hyperlipidemia   . Hypertension   . Mild mental slowing   . Poor historian   . Schizophrenia (HCC)   . Seizures (HCC)    has not had a seizure since last year. Unknown orgin- no family available to come and patient does not know.    Assessment: [age] YOF with a history of AFib on warfarin at home found unresponsive at home. Pharmacy consulted to start IV heparin bridge. INR on admit was 1.85 and heparin was initiated 3/31 then discontinued as INR rose to 2.15. Of note, heparin was subtherapeutic at 900 units/hr.   4/2 AM Update: heparin level low but  trending up, no issues per RN.   Goal of Therapy:  Heparin level 0.3-0.7 units/ml Monitor platelets by anticoagulation protocol: Yes   Plan:  -Inc heparin to 1700 units/hr -1300 HL  Abran DukeJames Korver Graybeal, PharmD, BCPS Clinical Pharmacist Phone: [phone]

## 2018-01-31 NOTE — Care Management (Signed)
Pt is not appropriate for LTACH referral at this time. Pt LOS 4 days, pt remains on vent via ETT tube, pt remains on heavy sedation  in addition to pressors  -  at this time pts discharge plan can not be accurately assessed based on current instability.   CM will continue to follow for discharge needs

## 2018-02-01 ENCOUNTER — Inpatient Hospital Stay (HOSPITAL_COMMUNITY): Payer: Medicare Other

## 2018-02-01 LAB — CULTURE, BLOOD (ROUTINE X 2)
CULTURE: NO GROWTH
Culture: NO GROWTH

## 2018-02-01 LAB — CBC WITH DIFFERENTIAL/PLATELET
BASOS ABS: 0 10*3/uL (ref 0.0–0.1)
Basophils Relative: 0 %
EOS PCT: 4 %
Eosinophils Absolute: 0.3 10*3/uL (ref 0.0–0.7)
HEMATOCRIT: 22 % — AB (ref 36.0–46.0)
HEMOGLOBIN: 7.2 g/dL — AB (ref 12.0–15.0)
LYMPHS ABS: 1.4 10*3/uL (ref 0.7–4.0)
LYMPHS PCT: 19 %
MCH: 31.9 pg (ref 26.0–34.0)
MCHC: 32.7 g/dL (ref 30.0–36.0)
MCV: 97.3 fL (ref 78.0–100.0)
MONOS PCT: 6 %
Monocytes Absolute: 0.4 10*3/uL (ref 0.1–1.0)
Neutro Abs: 5.2 10*3/uL (ref 1.7–7.7)
Neutrophils Relative %: 71 %
Platelets: 159 10*3/uL (ref 150–400)
RBC: 2.26 MIL/uL — AB (ref 3.87–5.11)
RDW: 15.5 % (ref 11.5–15.5)
WBC: 7.3 10*3/uL (ref 4.0–10.5)

## 2018-02-01 LAB — BASIC METABOLIC PANEL
Anion gap: 8 (ref 5–15)
BUN: 11 mg/dL (ref 6–20)
CALCIUM: 7 mg/dL — AB (ref 8.9–10.3)
CO2: 28 mmol/L (ref 22–32)
CREATININE: 0.74 mg/dL (ref 0.44–1.00)
Chloride: 104 mmol/L (ref 101–111)
GFR calc non Af Amer: >60 mL/min (ref >60)
Glucose, Bld: 171 mg/dL — ABNORMAL HIGH (ref 65–99)
Potassium: 3.3 mmol/L — ABNORMAL LOW (ref 3.5–5.1)
Sodium: 140 mmol/L (ref 135–145)

## 2018-02-01 LAB — GLUCOSE, CAPILLARY
GLUCOSE-CAPILLARY: 166 mg/dL — AB (ref 65–99)
GLUCOSE-CAPILLARY: 195 mg/dL — AB (ref 65–99)
Glucose-Capillary: 149 mg/dL — ABNORMAL HIGH (ref 65–99)
Glucose-Capillary: 153 mg/dL — ABNORMAL HIGH (ref 65–99)
Glucose-Capillary: 195 mg/dL — ABNORMAL HIGH (ref 65–99)
Glucose-Capillary: 260 mg/dL — ABNORMAL HIGH (ref 65–99)

## 2018-02-01 LAB — POCT I-STAT 3, ART BLOOD GAS (G3+)
Acid-Base Excess: 4 mmol/L — ABNORMAL HIGH (ref 0.0–2.0)
Bicarbonate: 30.4 mmol/L — ABNORMAL HIGH (ref 20.0–28.0)
O2 SAT: 95 %
PCO2 ART: 55.2 mmHg — AB (ref 32.0–48.0)
Patient temperature: 37.3
TCO2: 32 mmol/L (ref 22–32)
pH, Arterial: 7.35 (ref 7.350–7.450)
pO2, Arterial: 81 mmHg — ABNORMAL LOW (ref 83.0–108.0)

## 2018-02-01 LAB — MAGNESIUM: Magnesium: 1.6 mg/dL — ABNORMAL LOW (ref 1.7–2.4)

## 2018-02-01 LAB — HEPARIN LEVEL (UNFRACTIONATED)
Heparin Unfractionated: 0.65 IU/mL (ref 0.30–0.70)
Heparin Unfractionated: 0.38 IU/mL (ref 0.30–0.70)

## 2018-02-01 MED ORDER — POTASSIUM CHLORIDE 20 MEQ/15ML (10%) PO SOLN
40.0000 meq | ORAL | Status: AC
  Administered 2018-02-01 (×2): 40 meq
  Filled 2018-02-01 (×2): qty 30

## 2018-02-01 MED ORDER — POTASSIUM CHLORIDE 20 MEQ/15ML (10%) PO SOLN: 40.0000 meq | Freq: Once | ORAL | Status: DC

## 2018-02-01 MED ORDER — LACTULOSE 10 GM/15ML PO SOLN
20.0000 g | Freq: Every day | ORAL | Status: DC
  Administered 2018-02-01 – 2018-02-03 (×3): 20 g
  Filled 2018-02-01 (×4): qty 30

## 2018-02-01 MED ORDER — INSULIN GLARGINE 100 UNIT/ML ~~LOC~~ SOLN
15.0000 [IU] | Freq: Every day | SUBCUTANEOUS | Status: DC
  Administered 2018-02-02 – 2018-02-03 (×2): 15 [IU] via SUBCUTANEOUS
  Filled 2018-02-01 (×2): qty 0.15

## 2018-02-01 MED ORDER — FUROSEMIDE 10 MG/ML IJ SOLN
20.0000 mg | Freq: Two times a day (BID) | INTRAMUSCULAR | Status: AC
  Administered 2018-02-01 (×2): 20 mg via INTRAVENOUS
  Filled 2018-02-01 (×2): qty 2

## 2018-02-01 MED ORDER — FUROSEMIDE 10 MG/ML IJ SOLN: 20.0000 mg | Freq: Two times a day (BID) | INTRAMUSCULAR | Status: DC

## 2018-02-01 MED ORDER — MAGNESIUM SULFATE 2 GM/50ML IV SOLN
2.0000 g | Freq: Once | INTRAVENOUS | Status: AC
  Administered 2018-02-01: 2 g via INTRAVENOUS
  Filled 2018-02-01: qty 50

## 2018-02-01 MED ORDER — INSULIN ASPART 100 UNIT/ML ~~LOC~~ SOLN
3.0000 [IU] | SUBCUTANEOUS | Status: DC
  Administered 2018-02-01: 9 [IU] via SUBCUTANEOUS
  Administered 2018-02-01: 6 [IU] via SUBCUTANEOUS
  Administered 2018-02-02: 3 [IU] via SUBCUTANEOUS
  Administered 2018-02-02 (×2): 6 [IU] via SUBCUTANEOUS
  Administered 2018-02-02: 9 [IU] via SUBCUTANEOUS
  Administered 2018-02-02 (×3): 6 [IU] via SUBCUTANEOUS
  Administered 2018-02-03: 9 [IU] via SUBCUTANEOUS
  Administered 2018-02-03: 6 [IU] via SUBCUTANEOUS
  Administered 2018-02-03: 3 [IU] via SUBCUTANEOUS
  Administered 2018-02-03 (×2): 6 [IU] via SUBCUTANEOUS

## 2018-02-01 NOTE — Progress Notes (Signed)
ANTICOAGULATION CONSULT NOTE - Follow Up Consult  Pharmacy Consult for heparin. Indication: atrial fibrillation  Allergies  Allergen Reactions  . Sulfa Antibiotics Rash    Patient Measurements: Height: 5\' 2"  (157.5 cm) Weight: 202 lb 6.1 oz (91.8 kg) IBW/kg (Calculated) : 50.1 Heparin Dosing Weight: 68 kg  Vital Signs: Temp: 99 F (37.2 C) (04/04 1000) Temp Source: Core (04/04 0400) BP: 123/55 (04/04 0806) Pulse Rate: 69 (04/04 1000)  Labs: Recent Labs    01/30/18 0251  01/30/18 0403  01/31/18 0421 01/31/18 1234 01/31/18 2114 02/01/18 0350 02/01/18 0837  HGB  --    < > 7.5*  --  7.4*  --   --  7.2*  --   HCT  --   --  23.4*  --  23.1*  --   --  22.0*  --   PLT  --   --  168  --  181  --   --  159  --   LABPROT 22.2*  --   --   --   --   --   --   --   --   INR 1.96  --   --   --   --   --   --   --   --   HEPARINUNFRC  --   --   --    < > 0.20* 0.14* 0.27* 0.65  --   CREATININE  --   --  0.78  --   --  0.72  --   --  0.74   < > = values in this interval not displayed.    Estimated Creatinine Clearance: 84.8 mL/min (by C-G formula based on SCr of 0.74 mg/dL).   Medications:  Heparin 2150 units/hr  Assessment: [age] YOF with a history of AFib on warfarin at home found unresponsive at home. Pharmacy consulted to start IV heparin bridge.   Heparin level this AM was therapeutic at 0.65 on 2150 units/hr, however this was a significant increase from the previous level of 0.27. No bleeding or issues with the line per RN.  Goal of Therapy:  Heparin level 0.3-0.7 units/ml Monitor platelets by anticoagulation protocol: Yes   Plan:  - Reduce heparin to 2100 units/hr - Recheck heparin level tonight then with daily labs  GrenadaBrittany Laray Corbit 02/01/2018,10:48 AM

## 2018-02-01 NOTE — Progress Notes (Signed)
ANTICOAGULATION CONSULT NOTE - Follow Up Consult  Pharmacy Consult for heparin Indication: atrial fibrillation  Allergies  Allergen Reactions  . Sulfa Antibiotics Rash    Patient Measurements: Height: 5\' 2"  (157.5 cm) Weight: 202 lb 6.1 oz (91.8 kg) IBW/kg (Calculated) : 50.1 Heparin Dosing Weight: 68 kg  Vital Signs: Temp: 99.7 F (37.6 C) (04/04 1800) BP: 124/50 (04/04 1122) Pulse Rate: 77 (04/04 1800)  Labs: Recent Labs    01/30/18 0251  01/30/18 0403  01/31/18 0421 01/31/18 1234 01/31/18 2114 02/01/18 0350 02/01/18 0837 02/01/18 1728  HGB  --    < > 7.5*  --  7.4*  --   --  7.2*  --   --   HCT  --   --  23.4*  --  23.1*  --   --  22.0*  --   --   PLT  --   --  168  --  181  --   --  159  --   --   LABPROT 22.2*  --   --   --   --   --   --   --   --   --   INR 1.96  --   --   --   --   --   --   --   --   --   HEPARINUNFRC  --   --   --    < > 0.20* 0.14* 0.27* 0.65  --  0.38  CREATININE  --   --  0.78  --   --  0.72  --   --  0.74  --    < > = values in this interval not displayed.    Estimated Creatinine Clearance: 84.8 mL/min (by C-G formula based on SCr of 0.74 mg/dL).  Medications: Heparin 2100 units/hr  Assessment: [age] YOF with a history of AFib on warfarin PTA found unresponsive at home. Pharmacy consulted to start IV heparin.  Heparin level this afternoon was therapeutic, and a repeat level this evening remains in goal. No bleeding noted.   Goal of Therapy:  Heparin level 0.3-0.7 units/ml Monitor platelets by anticoagulation protocol: Yes   Plan:  Continue heparin at 2100 units/hr Daily heparin level and CBC Follow for s/s bleeding  Tejasvi Brissett D. Gaige Sebo, PharmD, BCPS Clinical Pharmacist [phone] 02/01/2018 6:49 PM

## 2018-02-01 NOTE — Progress Notes (Signed)
PULMONARY / CRITICAL CARE MEDICINE   Name: Natasha Martin MRN: [id number] DOB: 29-Dec-1960    ADMISSION DATE:  01/27/2018 CONSULTATION DATE:  01/27/2018  HISTORY OF PRESENT ILLNESS:   Essentially no immediate history on this 57 year old with diabetes felt mental delay and schizophrenia who also has a reported history of seizures in the chart. For the past 2-3 days she has been having a productive cough and she was found in the bathroom this morning unresponsive. When EMS arrived her saturations were in the 20s. She was taking to [facility] where she was intubated and chest x-ray has shown diffuse bilateral infiltrates.Admitted3/30/2019  She has a past medical history of Anxiety, Diabetes ([place]), Dysphagia, Hyperlipidemia, Hypertension, Mild mental slowing, Poor historian, Schizophrenia ([place]), and Seizures ([place]).has a past surgical history that includes Leg Surgery (Bilateral); Colonoscopy with propofol (N/A, 01/28/2014); Esophagogastroduodenoscopy (egd) with propofol (N/A, 01/28/2014); Savory dilation (N/A, 01/28/2014); polypectomy (01/28/2014); and biopsy (01/28/2014).reports that she has never smoked. She has never used smokeless tobacco.  SUBJECTIVE:  Intubated and sedated. Diuresed well yesterday with lower dose lasix but remained net neutral for the day and is +12L for the admission. Bronchoscopy done yesterday with mucus plugs in the RLL suctioned out. BAL gram stain without any organisms seen, culture pending. No clinical changes overnight.   VITAL SIGNS: BP (!) 105/54   Pulse 61   Temp 99 F (37.2 C)   Resp (!) 35   Ht _0  (1.575 m)   Wt 202 lb 6.1 oz (91.8 kg)   LMP 01/21/2014   SpO2 92%   BMI 37.02 kg/m   HEMODYNAMICS:    VENTILATOR SETTINGS: Vent Mode: PRVC FiO2 (%):  [40 %-100 %] 40 % Set Rate:  [35 bmp] 35 bmp Vt Set:  [400 mL] 400 mL PEEP:  [8 cmH20] 8 cmH20 Plateau Pressure:  [20 cmH20-27 cmH20] 22 cmH20  INTAKE / OUTPUT: I/O last 3 completed  shifts: In: 5172.3 [I.V.:3042.3; NG/GT:1630; IV Piggyback:500] Out: 3635 [Urine:3635]  PHYSICAL EXAMINATION: Gen:       No acute distress HEENT:  EOMI, sclera anicteric Neck:     No masses; no thyromegaly, ETT Lungs:    Clear to auscultation bilaterally; normal respiratory effort CV:         Regular rate and rhythm; no murmurs Abd:        + bowel sounds; soft, non-tender; no palpable masses, no distension Ext:         No edema; adequate peripheral perfusion Skin:       Warm and dry; no rash Neuro:    Sedated, unresponsive  LABS:  BMET Recent Labs  Lab 01/29/18 0200 01/30/18 0403 01/31/18 1234  NA 131* 134* 138  K 3.5 3.5 3.5  CL 101 100* 106  CO2 _1 BUN _2 CREATININE 0.80 0.78 0.72  GLUCOSE 192* 213* 198*    Electrolytes Recent Labs  Lab 01/28/18 1448 01/29/18 0200 01/29/18 1657 01/30/18 0403 01/31/18 1234  CALCIUM  --  6.8*  --  6.8* 7.0*  MG 2.3 1.8 1.6*  --   --   PHOS 2.2* 2.5 2.7  --   --     CBC Recent Labs  Lab 01/30/18 0403 01/31/18 0421 02/01/18 0350  WBC 7.5 6.4 7.3  HGB 7.5* 7.4* 7.2*  HCT 23.4* 23.1* 22.0*  PLT 168 181 159    Coag's Recent Labs  Lab 01/28/18 0920 01/29/18 0545 01/30/18 0251  INR 2.15 2.06 1.96  Sepsis Markers Recent Labs  Lab 01/27/18 1050 01/27/18 2012 01/28/18 0920 01/29/18 0200 01/30/18 0403  LATICACIDVEN 4.5* 1.3 0.8  --   --   PROCALCITON  --   --  0.40 0.37 0.49    ABG Recent Labs  Lab 01/29/18 1605 01/30/18 0400 01/31/18 0800  PHART 7.339* 7.428 7.437  PCO2ART 50.2* 42.6 39.6  PO2ART 133.0* 80.7* 82.0*    Liver Enzymes Recent Labs  Lab 01/27/18 0854 01/28/18 0920 01/29/18 0200  AST 61* 82* 38  ALT 59* 84* 58*  ALKPHOS 125 108 93  BILITOT 1.4* 1.7* 1.1  ALBUMIN 2.9* 1.9* 1.6*    Cardiac Enzymes Recent Labs  Lab 01/28/18 0920 01/28/18 1449 01/29/18 0200  TROPONINI <0.03 <0.03 <0.03  <0.03    Glucose Recent Labs  Lab 01/31/18 0802 01/31/18 1110  01/31/18 1549 01/31/18 1959 02/01/18 0000 02/01/18 0412  GLUCAP 134* 206* 150* 154* 149* 166*    Imaging Dg Chest Port 1 View  Result Date: 01/31/2018 CLINICAL DATA:  ARDS. EXAM: PORTABLE CHEST 1 VIEW COMPARISON:  One-view chest x-ray 01/30/2018 FINDINGS: Heart is enlarged. Endotracheal tube, NG tube, and right IJ line are stable. Interstitial and airspace disease bilaterally is similar the prior exam. Bilateral effusions are present. IMPRESSION: 1. Stable appearance of diffuse interstitial and airspace disease compatible with ARDS. 2. Support apparatus is stable. Electronically Signed   By: San Morelle M.D.   On: 01/31/2018 09:43   STUDIES:  Strep Pneumo - negative Flu panel - negative RSVP - negative  CULTURES: 01/27/2018 Blood Culture: NG at 4 days 01/28/2018 Tracheal Aspirate: Rare G- cocci, few candida albicans 01/31/2018: BAL, no organisms on gram stain, culture pending  ANTIBIOTICS: Azithromycin 3/30 >> 4/3 Vancomycin 3/30 >> 4/1 Zosyn 3/30 >>  SIGNIFICANT EVENTS: 01/27/2018: [facility]red from [facility]; intubated; CXR with bilateral infiltrates 01/28/2018: ARDS protocol; Nimbex started 01/30/2018: Nimbex stopped; required propofol > drop in BP and levo started 01/31/2018: Bronchoscopy   LINES/TUBES: RIJ 3/30 >> L Femoral A-line 3/30 >>  ASSESSMENT / PLAN:  PULMONARY #baseline - no known pulmonary issues #current:Admitted with ARDS.  S/P paralytics. Had bronch yesterday but mucus plug removal. Culture pending. Remains on low dose levo due to hypotension from sedation. Vent settings have been stable. Finished 5 day course of Azithro. Tolerated diuresis yesterday with 3L output.   P: Follow up BAL Continue Zosyn Check ABG daily  Continue diursesis   CARDIOVASCULAR #Baseline - Known A Fib patient . ? on amio, lipitor, tricor lopresso, omega 3, coumadin at home. Last echo 2016 #Admit - Circulatory shock at admit with fever (RVP and urine strep  engative). Initially on levo but weaned off.  # Aortic and mitral valve disease - cardiology consulted, no work up this hospitalization recommended No new events. Tolerated low dose lasix and albumin yesterday.   P: Wean Levo as able.  Check CVP. Goal 8-12 On heparin gtt Continue diuretics.    RENAL #baseline:Normal renal function #currentAt admit - normal renal function AT risk for AKI given ARDS 3L UOP yestreday, net neutral   P: Follow labs. Cr has been stable.   GASTROINTESTINAL On PPI at home Currently NPO  P: contnue NPO Continue TFs PPI  HEMATOLOGIC A:  #RBC:mild anemia of critical illness (admit hgb 3.5 spurious) - stable at 7.4 #Plateletnormal #Leukocytosis resolved]  Hgb stable   P: - PRBC for hgb </= 6.9gm%   INFECTIOUS Likely sepstic shock based on fever and presentation. Source probably lung. On Zosyn. Finished Azithro. Vanc d/c'd.  P: Continue ABX   ENDOCRINE DM on glybrudie, insulin, metformin at home AT risk hypegluycemia  P: Continue lantus ICU hyperglycemia protocol  NEUROLOGIC #Baseline :Psych hx + - ? on trazadone, seroquel, vicodine, tegretol , cogentin, and tylonl # 3 at home #Current:admit for ARDS   P: RASS goal:-4 in ARDS    FAMILY  - Updates: [facility]     [facility] [place] Pager: [phone]  02/01/2018, 7:12 AM

## 2018-02-02 ENCOUNTER — Inpatient Hospital Stay (HOSPITAL_COMMUNITY): Payer: Medicare Other

## 2018-02-02 LAB — DRUG PROFILE, UR, 9 DRUGS (LABCORP)
Amphetamines, Urine: NEGATIVE ng/mL
Barbiturate, Ur: NEGATIVE ng/mL
Benzodiazepine Quant, Ur: POSITIVE — AB
COCAINE (METAB.): NEGATIVE ng/mL
Cannabinoid Quant, Ur: NEGATIVE ng/mL
METHADONE SCREEN, URINE: NEGATIVE ng/mL
OPIATE QUANT UR: NEGATIVE ng/mL
PROPOXYPHENE, URINE: NEGATIVE ng/mL
Phencyclidine, Ur: NEGATIVE ng/mL

## 2018-02-02 LAB — GLUCOSE, CAPILLARY
GLUCOSE-CAPILLARY: 181 mg/dL — AB (ref 65–99)
Glucose-Capillary: 145 mg/dL — ABNORMAL HIGH (ref 65–99)
Glucose-Capillary: 162 mg/dL — ABNORMAL HIGH (ref 65–99)
Glucose-Capillary: 185 mg/dL — ABNORMAL HIGH (ref 65–99)
Glucose-Capillary: 187 mg/dL — ABNORMAL HIGH (ref 65–99)
Glucose-Capillary: 193 mg/dL — ABNORMAL HIGH (ref 65–99)
Glucose-Capillary: 212 mg/dL — ABNORMAL HIGH (ref 65–99)

## 2018-02-02 LAB — BASIC METABOLIC PANEL
ANION GAP: 9 (ref 5–15)
BUN: 10 mg/dL (ref 6–20)
CALCIUM: 7.4 mg/dL — AB (ref 8.9–10.3)
CO2: 31 mmol/L (ref 22–32)
Chloride: 102 mmol/L (ref 101–111)
Creatinine, Ser: 0.82 mg/dL (ref 0.44–1.00)
Glucose, Bld: 190 mg/dL — ABNORMAL HIGH (ref 65–99)
Potassium: 4 mmol/L (ref 3.5–5.1)
SODIUM: 142 mmol/L (ref 135–145)

## 2018-02-02 LAB — BLOOD GAS, ARTERIAL
ACID-BASE EXCESS: 9.7 mmol/L — AB (ref 0.0–2.0)
BICARBONATE: 34.8 mmol/L — AB (ref 20.0–28.0)
DRAWN BY: 41422
FIO2: 50
LHR: 35 {breaths}/min
MECHVT: 400 mL
O2 Saturation: 94.6 %
PEEP/CPAP: 8 cmH2O
Patient temperature: 98.6
pCO2 arterial: 58.1 mmHg — ABNORMAL HIGH (ref 32.0–48.0)
pH, Arterial: 7.395 (ref 7.350–7.450)
pO2, Arterial: 81.5 mmHg — ABNORMAL LOW (ref 83.0–108.0)

## 2018-02-02 LAB — CBC
HCT: 24.5 % — ABNORMAL LOW (ref 36.0–46.0)
Hemoglobin: 7.5 g/dL — ABNORMAL LOW (ref 12.0–15.0)
MCH: 30.4 pg (ref 26.0–34.0)
MCHC: 30.6 g/dL (ref 30.0–36.0)
MCV: 99.2 fL (ref 78.0–100.0)
PLATELETS: 196 10*3/uL (ref 150–400)
RBC: 2.47 MIL/uL — AB (ref 3.87–5.11)
RDW: 15.5 % (ref 11.5–15.5)
WBC: 10.3 10*3/uL (ref 4.0–10.5)

## 2018-02-02 LAB — HEPARIN LEVEL (UNFRACTIONATED): HEPARIN UNFRACTIONATED: 0.46 [IU]/mL (ref 0.30–0.70)

## 2018-02-02 LAB — TRIGLYCERIDES: TRIGLYCERIDES: 130 mg/dL (ref <150)

## 2018-02-02 LAB — MAGNESIUM: Magnesium: 1.9 mg/dL (ref 1.7–2.4)

## 2018-02-02 LAB — PROCALCITONIN: Procalcitonin: <0.1 ng/mL

## 2018-02-02 MED ORDER — MUPIROCIN 2 % EX OINT
TOPICAL_OINTMENT | CUTANEOUS | Status: AC
  Administered 2018-02-02: 1
  Filled 2018-02-02: qty 22

## 2018-02-02 MED ORDER — QUETIAPINE FUMARATE 300 MG PO TABS
300.0000 mg | ORAL_TABLET | Freq: Two times a day (BID) | ORAL | Status: DC
  Administered 2018-02-02 – 2018-02-05 (×7): 300 mg
  Filled 2018-02-02 (×9): qty 1

## 2018-02-02 MED ORDER — FUROSEMIDE 10 MG/ML IJ SOLN
40.0000 mg | Freq: Two times a day (BID) | INTRAMUSCULAR | Status: DC
  Administered 2018-02-02 – 2018-02-03 (×3): 40 mg via INTRAVENOUS
  Filled 2018-02-02 (×3): qty 4

## 2018-02-02 MED ORDER — POTASSIUM CHLORIDE 20 MEQ/15ML (10%) PO SOLN
40.0000 meq | Freq: Every day | ORAL | Status: DC
  Administered 2018-02-02 – 2018-02-10 (×8): 40 meq via ORAL
  Filled 2018-02-02 (×9): qty 30

## 2018-02-02 MED ORDER — DEXMEDETOMIDINE HCL IN NACL 400 MCG/100ML IV SOLN
0.4000 ug/kg/h | INTRAVENOUS | Status: DC
  Administered 2018-02-02 – 2018-02-03 (×4): 0.4 ug/kg/h via INTRAVENOUS
  Administered 2018-02-04 – 2018-02-05 (×4): 0.6 ug/kg/h via INTRAVENOUS
  Filled 2018-02-02 (×8): qty 100

## 2018-02-02 MED ORDER — MAGNESIUM HYDROXIDE 400 MG/5ML PO SUSP
5.0000 mL | Freq: Every day | ORAL | Status: DC
  Administered 2018-02-02 – 2018-02-03 (×2): 5 mL
  Filled 2018-02-02 (×2): qty 30

## 2018-02-02 MED ORDER — DEXMEDETOMIDINE HCL IN NACL 200 MCG/50ML IV SOLN
0.4000 ug/kg/h | INTRAVENOUS | Status: DC
  Administered 2018-02-02: 0.4 ug/kg/h via INTRAVENOUS
  Filled 2018-02-02: qty 50

## 2018-02-02 NOTE — Care Management Note (Signed)
Case Management Note  Patient Details  Name: Retta Macaula S Knights MRN: [id number] Date of Birth: 02-Mar-1961  Subjective/Objective:    Pt admitted with SOB                Action/Plan:  PTA from home.  Pt remains on ventilator.     Expected Discharge Date:                  Expected Discharge Plan:  Skilled Nursing Facility  In-House Referral:  Clinical Social Work  Discharge planning Services  CM Consult  Post Acute Care Choice:    Choice offered to:     DME Arranged:    DME Agency:     HH Arranged:    HH Agency:     Status of Service:     If discussed at [employer] Meetings, dates discussed:    Additional Comments:  Cherylann ParrClaxton, Kerensa Nicklas S, RN 02/02/2018, 4:30 PM

## 2018-02-02 NOTE — Progress Notes (Signed)
PULMONARY / CRITICAL CARE MEDICINE   Name: Natasha Martin MRN: [id number] DOB: 04/11/1961    ADMISSION DATE:  01/27/2018 CONSULTATION DATE:  01/27/2018  HISTORY OF PRESENT ILLNESS:   Essentially no immediate history on this 57 year old with diabetes felt mental delay and schizophrenia who also has a reported history of seizures in the chart. For the past 2-3 days she has been having a productive cough and she was found in the bathroom this morning unresponsive. When EMS arrived her saturations were in the 20s. She was taking to [facility] where she was intubated and chest x-ray has shown diffuse bilateral infiltrates.Admitted3/30/2019  She has a past medical history of Anxiety, Diabetes ([place]), Dysphagia, Hyperlipidemia, Hypertension, Mild mental slowing, Poor historian, Schizophrenia ([place]), and Seizures ([place]).has a past surgical history that includes Leg Surgery (Bilateral); Colonoscopy with propofol (N/A, 01/28/2014); Esophagogastroduodenoscopy (egd) with propofol (N/A, 01/28/2014); Savory dilation (N/A, 01/28/2014); polypectomy (01/28/2014); and biopsy (01/28/2014).reports that she has never smoked. She has never used smokeless tobacco.  SUBJECTIVE:  Intubated and sedated. Febrile to 100.8 overnight. Tolerated additional diuresis yesterday with 4.5 L UOP and net negative 1.5 L yesterday. Weaned sedation as able but still become agitated and fighting vent with weaning. Vent settings and ABG stable. CXR largely unchanged. BAL culture with no growth to date.   VITAL SIGNS: BP (!) 114/43   Pulse 81   Temp (!) 100.8 F (38.2 C)   Resp (!) 35   Ht _0  (1.575 m)   Wt 196 lb 10.4 oz (89.2 kg)   LMP 01/21/2014   SpO2 94%   BMI 35.97 kg/m   HEMODYNAMICS: CVP:  [17 mmHg] 17 mmHg  VENTILATOR SETTINGS: Vent Mode: PRVC FiO2 (%):  [50 %] 50 % Set Rate:  [35 bmp] 35 bmp Vt Set:  [400 mL] 400 mL PEEP:  [8 cmH20] 8 cmH20 Plateau Pressure:  [21 NLG92-11 cmH20] 21 cmH20  INTAKE /  OUTPUT: I/O last 3 completed shifts: In: 4708.6 [I.V.:2238.6; NG/GT:1920; IV Piggyback:550] Out: 69 [Urine:5110; Emesis/NG output:300]  PHYSICAL EXAMINATION: Gen:       No acute distress HEENT:  EOMI, sclera anicteric Neck:     No masses; no thyromegaly, ETT Lungs:    Clear to auscultation bilaterally; normal respiratory effort CV:         Regular rate and rhythm; no murmurs Abd:        + bowel sounds; soft, non-tender; no palpable masses, no distension Ext:         No edema; adequate peripheral perfusion Skin:       Warm and dry; no rash Neuro:    Sedated, unresponsive  LABS:  BMET Recent Labs  Lab 01/31/18 1234 02/01/18 0837 02/02/18 0414  NA 138 140 142  K 3.5 3.3* 4.0  CL 106 104 102  CO2 _1 BUN _2 CREATININE 0.72 0.74 0.82  GLUCOSE 198* 171* 190*    Electrolytes Recent Labs  Lab 01/28/18 1448 01/29/18 0200 01/29/18 1657  01/31/18 1234 02/01/18 0837 02/01/18 1034 02/02/18 0414  CALCIUM  --  6.8*  --    < > 7.0* 7.0*  --  7.4*  MG 2.3 1.8 1.6*  --   --   --  1.6* 1.9  PHOS 2.2* 2.5 2.7  --   --   --   --   --    < > = values in this interval not displayed.    CBC Recent Labs  Lab 01/31/18 0421  02/01/18 0350 02/02/18 0414  WBC 6.4 7.3 10.3  HGB 7.4* 7.2* 7.5*  HCT 23.1* 22.0* 24.5*  PLT 181 159 196    Coag's Recent Labs  Lab 01/28/18 0920 01/29/18 0545 01/30/18 0251  INR 2.15 2.06 1.96    Sepsis Markers Recent Labs  Lab 01/27/18 1050 01/27/18 2012 01/28/18 0920 01/29/18 0200 01/30/18 0403  LATICACIDVEN 4.5* 1.3 0.8  --   --   PROCALCITON  --   --  0.40 0.37 0.49    ABG Recent Labs  Lab 01/31/18 0800 02/01/18 0830 02/02/18 0410  PHART 7.437 7.350 7.395  PCO2ART 39.6 55.2* 58.1*  PO2ART 82.0* 81.0* 81.5*    Liver Enzymes Recent Labs  Lab 01/27/18 0854 01/28/18 0920 01/29/18 0200  AST 61* 82* 38  ALT 59* 84* 58*  ALKPHOS 125 108 93  BILITOT 1.4* 1.7* 1.1  ALBUMIN 2.9* 1.9* 1.6*    Cardiac  Enzymes Recent Labs  Lab 01/28/18 0920 01/28/18 1449 01/29/18 0200  TROPONINI <0.03 <0.03 <0.03  <0.03    Glucose Recent Labs  Lab 02/01/18 1152 02/01/18 1600 02/01/18 2037 02/02/18 0020 02/02/18 0341 02/02/18 0713  GLUCAP 195* 260* 195* 187* 185* 181*    Imaging Dg Chest Port 1 View  Result Date: 02/02/2018 CLINICAL DATA:  ARDS EXAM: PORTABLE CHEST 1 VIEW COMPARISON:  02/01/2018; 01/28/2018; 01/27/2018 FINDINGS: Grossly unchanged cardiac silhouette and mediastinal contours. Stable positioning of support apparatus. Extensive bilateral heterogeneous consolidative airspace opacities are unchanged. No new focal airspace opacities. Trace pleural effusions are not excluded. No pneumothorax. No definite evidence of edema. Grossly unchanged bones including deformity of the right proximal humerus, incompletely evaluated. IMPRESSION: 1.  Stable positioning of support apparatus.  No pneumothorax. 2. Grossly unchanged extensive bilateral heterogeneous consolidative airspace opacities with broad differential considerations including alveolar pulmonary edema, multifocal infection (including atypical etiologies), aspiration and hemorrhage. Electronically Signed   By: Sandi Mariscal M.D.   On: 02/02/2018 07:39   Dg Chest Port 1 View  Result Date: 02/01/2018 CLINICAL DATA:  Follow-up, ARDS EXAM: PORTABLE CHEST 1 VIEW COMPARISON:  01/31/2018 FINDINGS: Endotracheal tube, nasogastric catheter and right jugular central line are again seen and stable. Cardiac shadow is stable. There has been some increase in the degree of diffuse airspace disease when compare with the prior exam particularly in the bases. Likely increasing effusions are present as well. No bony abnormality is noted. IMPRESSION: Increase in bibasilar infiltrative changes as well as effusions. Tubes and lines as described. Electronically Signed   By: Inez Catalina M.D.   On: 02/01/2018 09:27   STUDIES:  Strep Pneumo - negative Flu panel -  negative RSVP - negative  CULTURES: 01/27/2018 Blood Culture: NG final 01/28/2018 Tracheal Aspirate: Rare G- cocci, few candida albicans 01/31/2018: BAL, no organisms on gram stain, culture NG at 1 day  ANTIBIOTICS: Azithromycin 3/30 >> 4/3 Vancomycin 3/30 >> 4/1 Zosyn 3/30 >>  SIGNIFICANT EVENTS: 01/27/2018: Transferred from [facility]; intubated; CXR with bilateral infiltrates 01/28/2018: ARDS protocol; Nimbex started 01/30/2018: Nimbex stopped; required propofol > drop in BP and levo started 01/31/2018: Bronchoscopy   LINES/TUBES: RIJ 3/30 >> L Femoral A-line 3/30 >>  ASSESSMENT / PLAN:  PULMONARY #baseline - no known pulmonary issues Acute resp failure, ARDS, aspiration S/P paralytics and bronchoscopy 4/3. Weaning sedation as able but patient become agitated easily. Bronch culture with no growth to date. Vent settings stable. CXR unchanged.   P: Wean sedation as able Follow up BAL Continue Zosyn Check ABG & CXR daily  CARDIOVASCULAR #Baseline - Known A Fib patient . ? on amio, lipitor, tricor lopresso, omega 3, coumadin at home.  #Admit - Circulatory shock at admit with fever (RVP and urine strep engative). Initially on levo but weaned off.  # Aortic and mitral valve disease - cardiology consulted, no work up this hospitalization recommended No new events. Diuresed well yesterday with 4.25 L UOP, net negative 1.5L. BP stable off pressors. CVP pending today (17 yesterday).   P: Check daily CVP. On heparin gtt Continue Lasix 20 mg IV bid   RENAL #baseline:Normal renal function #currentAt admit - normal renal function AT risk for AKI given ARDS 4.5L UOP yestreday, net 1.5L out  P: Follow labs. Cr has been stable.   GASTROINTESTINAL On PPI at home Currently NPO  P: contnue NPO Continue TFs PPI  HEMATOLOGIC A:  #RBC:mild anemia of critical illness (admit hgb 3.5 spurious) - stable  #Plateletnormal #Leukocytosis resolved]  Hgb  stable   P: - PRBC for hgb </= 6.9gm%   INFECTIOUS Likely sepstic shock based on fever and presentation. Source probably lung. On Zosyn. Finished Azithro. Vanc d/c'd. P: Continue ABX   ENDOCRINE DM on glybrudie, insulin, metformin at home AT risk hypegluycemia  P: Continue lantus ICU hyperglycemia protocol  NEUROLOGIC #Baseline :Psych hx + - ? on trazadone, seroquel, vicodine, tegretol , cogentin, and tylonl # 3 at home #Current:admit for ARDS   P: RASS goal:-4 in ARDS    FAMILY  - Updates: No family at bedside     Pulmonary and [place] Pager: [phone]  02/02/2018, 7:59 AM

## 2018-02-02 NOTE — Progress Notes (Signed)
ANTICOAGULATION CONSULT NOTE - Follow Up Consult  Pharmacy Consult for heparin Indication: atrial fibrillation  Allergies  Allergen Reactions  . Sulfa Antibiotics Rash    Patient Measurements: Height: 5\' 2"  (157.5 cm) Weight: 196 lb 10.4 oz (89.2 kg) IBW/kg (Calculated) : 50.1 Heparin Dosing Weight: 68 kg  Vital Signs: Temp: 100.8 F (38.2 C) (04/05 0600) BP: 114/43 (04/05 0600) Pulse Rate: 81 (04/05 0600)  Labs: Recent Labs    01/31/18 0421 01/31/18 1234  02/01/18 0350 02/01/18 0837 02/01/18 1728 02/02/18 0414 02/02/18 0500  HGB 7.4*  --   --  7.2*  --   --  7.5*  --   HCT 23.1*  --   --  22.0*  --   --  24.5*  --   PLT 181  --   --  159  --   --  196  --   HEPARINUNFRC 0.20* 0.14*   < > 0.65  --  0.38  --  0.46  CREATININE  --  0.72  --   --  0.74  --  0.82  --    < > = values in this interval not displayed.    Estimated Creatinine Clearance: 81.3 mL/min (by C-G formula based on SCr of 0.82 mg/dL).  Medications: Heparin 2100 units/hr  Assessment: [age] yoF with a history of AFib on warfarin PTA found unresponsive at home. Pharmacy consulted to start IV heparin while holding oral AC. Heparin level therapeutic at 0.46, Hgb remains low but stable, no overt S/Sx bleeding noted.   Goal of Therapy:  Heparin level 0.3-0.7 units/ml Monitor platelets by anticoagulation protocol: Yes   Plan:  -Continue heparin 2100 units/hr -Check daily heparin level and CBC -Monitor S/Sx bleeding  Fredonia HighlandMichael Kamill Fulbright, PharmD, BCPS PGY-2 Cardiology Pharmacy Resident Pager: [phone] 02/02/2018

## 2018-02-03 ENCOUNTER — Other Ambulatory Visit: Payer: Self-pay

## 2018-02-03 LAB — CULTURE, BAL-QUANTITATIVE W GRAM STAIN: Special Requests: NORMAL

## 2018-02-03 LAB — GLUCOSE, CAPILLARY
GLUCOSE-CAPILLARY: 140 mg/dL — AB (ref 65–99)
GLUCOSE-CAPILLARY: 196 mg/dL — AB (ref 65–99)
GLUCOSE-CAPILLARY: 239 mg/dL — AB (ref 65–99)
GLUCOSE-CAPILLARY: 261 mg/dL — AB (ref 65–99)
Glucose-Capillary: 183 mg/dL — ABNORMAL HIGH (ref 65–99)
Glucose-Capillary: 268 mg/dL — ABNORMAL HIGH (ref 65–99)
Glucose-Capillary: 177 mg/dL — ABNORMAL HIGH (ref 65–99)

## 2018-02-03 LAB — HEPARIN LEVEL (UNFRACTIONATED): HEPARIN UNFRACTIONATED: 0.42 [IU]/mL (ref 0.30–0.70)

## 2018-02-03 LAB — CBC
HCT: 22.6 % — ABNORMAL LOW (ref 36.0–46.0)
HEMOGLOBIN: 7.1 g/dL — AB (ref 12.0–15.0)
MCH: 31.4 pg (ref 26.0–34.0)
MCHC: 31.4 g/dL (ref 30.0–36.0)
MCV: 100 fL (ref 78.0–100.0)
PLATELETS: 173 10*3/uL (ref 150–400)
RBC: 2.26 MIL/uL — ABNORMAL LOW (ref 3.87–5.11)
RDW: 15.9 % — ABNORMAL HIGH (ref 11.5–15.5)
WBC: 8.2 10*3/uL (ref 4.0–10.5)

## 2018-02-03 LAB — CULTURE, BAL-QUANTITATIVE

## 2018-02-03 MED ORDER — INSULIN GLARGINE 100 UNIT/ML ~~LOC~~ SOLN
25.0000 [IU] | Freq: Every day | SUBCUTANEOUS | Status: DC
  Administered 2018-02-04: 25 [IU] via SUBCUTANEOUS
  Filled 2018-02-03 (×2): qty 0.25

## 2018-02-03 MED ORDER — MAGNESIUM HYDROXIDE 400 MG/5ML PO SUSP: 5.0000 mL | Freq: Two times a day (BID) | ORAL | Status: DC

## 2018-02-03 MED ORDER — MAGNESIUM HYDROXIDE 400 MG/5ML PO SUSP
5.0000 mL | Freq: Every day | ORAL | Status: DC
  Administered 2018-02-03: 5 mL
  Filled 2018-02-03 (×2): qty 30

## 2018-02-03 MED ORDER — SORBITOL 70 % SOLN
960.0000 mL | TOPICAL_OIL | Freq: Once | ORAL | Status: DC
  Filled 2018-02-03: qty 473

## 2018-02-03 MED ORDER — INSULIN GLARGINE 100 UNIT/ML ~~LOC~~ SOLN
10.0000 [IU] | Freq: Once | SUBCUTANEOUS | Status: AC
  Administered 2018-02-03: 10 [IU] via SUBCUTANEOUS
  Filled 2018-02-03: qty 0.1

## 2018-02-03 MED ORDER — LACTULOSE 10 GM/15ML PO SOLN
30.0000 g | Freq: Two times a day (BID) | ORAL | Status: DC
  Administered 2018-02-03: 30 g
  Filled 2018-02-03 (×2): qty 45

## 2018-02-03 MED ORDER — FUROSEMIDE 10 MG/ML IJ SOLN: 60.0000 mg | Freq: Two times a day (BID) | INTRAMUSCULAR | Status: DC

## 2018-02-03 MED ORDER — FUROSEMIDE 10 MG/ML IJ SOLN
40.0000 mg | Freq: Three times a day (TID) | INTRAMUSCULAR | Status: DC
  Administered 2018-02-03 – 2018-02-04 (×3): 40 mg via INTRAVENOUS
  Filled 2018-02-03 (×3): qty 4

## 2018-02-03 NOTE — Progress Notes (Signed)
PULMONARY / CRITICAL CARE MEDICINE   Name: Natasha Martin MRN: [id number] DOB: [birth year]    ADMISSION DATE:  01/27/2018 CONSULTATION DATE:  01/27/2018  HISTORY OF PRESENT ILLNESS:   Essentially no immediate history on this 57 year old with diabetes felt mental delay and schizophrenia who also has a reported history of seizures in the chart. For the past 2-3 days she has been having a productive cough and she was found in the bathroom this morning unresponsive. When EMS arrived her saturations were in the 20s. She was taking to [facility] where she was intubated and chest x-ray has shown diffuse bilateral infiltrates.Admitted3/30/2019  She has a past medical history of Anxiety, Diabetes ([place]), Dysphagia, Hyperlipidemia, Hypertension, Mild mental slowing, Poor historian, Schizophrenia ([place]), and Seizures ([place]).has a past surgical history that includes Leg Surgery (Bilateral); Colonoscopy with propofol (N/A, 01/28/2014); Esophagogastroduodenoscopy (egd) with propofol (N/A, 01/28/2014); Savory dilation (N/A, 01/28/2014); polypectomy (01/28/2014); and biopsy (01/28/2014).reports that she has never smoked. She has never used smokeless tobacco.  SUBJECTIVE:  No issues Diuresing well.  On weaning trial  VITAL SIGNS: BP 111/67   Pulse 92   Temp 99.9 F (37.7 C)   Resp (!) 24   Ht _0  (1.575 m)   Wt 192 lb 14.4 oz (87.5 kg)   LMP 01/21/2014   SpO2 100%   BMI 35.28 kg/m   HEMODYNAMICS:    VENTILATOR SETTINGS: Vent Mode: PSV;CPAP FiO2 (%):  [40 %] 40 % Set Rate:  [35 bmp] 35 bmp Vt Set:  [400 mL] 400 mL PEEP:  [8 cmH20] 8 cmH20 Pressure Support:  [8 cmH20] 8 cmH20 Plateau Pressure:  [23 TIR44-31 cmH20] 23 cmH20  INTAKE / OUTPUT: I/O last 3 completed shifts: In: 4815.1 [I.V.:2205.1; NG/GT:2160; IV Piggyback:450] Out: 5400 [QQPYP:9509; Emesis/NG output:300]  PHYSICAL EXAMINATION: Gen:      No acute distress HEENT:  EOMI, sclera anicteric, ET tube Neck:     No  masses; no thyromegaly Lungs:    Clear to auscultation bilaterally; normal respiratory effort CV:         Regular rate and rhythm; no murmurs Abd:      + bowel sounds; soft, non-tender; no palpable masses, no distension Ext: 1+ edema,  adequate peripheral perfusion Skin:      Warm and dry; no rash Neuro: Sedated, unresponsive  LABS:  BMET Recent Labs  Lab 01/31/18 1234 02/01/18 0837 02/02/18 0414  NA 138 140 142  K 3.5 3.3* 4.0  CL 106 104 102  CO2 _1 BUN _2 CREATININE 0.72 0.74 0.82  GLUCOSE 198* 171* 190*    Electrolytes Recent Labs  Lab 01/28/18 1448 01/29/18 0200 01/29/18 1657  01/31/18 1234 02/01/18 0837 02/01/18 1034 02/02/18 0414  CALCIUM  --  6.8*  --    < > 7.0* 7.0*  --  7.4*  MG 2.3 1.8 1.6*  --   --   --  1.6* 1.9  PHOS 2.2* 2.5 2.7  --   --   --   --   --    < > = values in this interval not displayed.    CBC Recent Labs  Lab 02/01/18 0350 02/02/18 0414 02/03/18 0440  WBC 7.3 10.3 8.2  HGB 7.2* 7.5* 7.1*  HCT 22.0* 24.5* 22.6*  PLT 159 196 173    Coag's Recent Labs  Lab 01/28/18 0920 01/29/18 0545 01/30/18 0251  INR 2.15 2.06 1.96    Sepsis Markers Recent Labs  Lab 01/27/18 1050  01/27/18 2012  01/28/18 0920 01/29/18 0200 01/30/18 0403 02/02/18 1059  LATICACIDVEN 4.5* 1.3  --  0.8  --   --   --   PROCALCITON  --   --    < > 0.40 0.37 0.49 <0.10   < > = values in this interval not displayed.    ABG Recent Labs  Lab 01/31/18 0800 02/01/18 0830 02/02/18 0410  PHART 7.437 7.350 7.395  PCO2ART 39.6 55.2* 58.1*  PO2ART 82.0* 81.0* 81.5*    Liver Enzymes Recent Labs  Lab 01/28/18 0920 01/29/18 0200  AST 82* 38  ALT 84* 58*  ALKPHOS 108 93  BILITOT 1.7* 1.1  ALBUMIN 1.9* 1.6*    Cardiac Enzymes Recent Labs  Lab 01/28/18 0920 01/28/18 1449 01/29/18 0200  TROPONINI <0.03 <0.03 <0.03  <0.03    Glucose Recent Labs  Lab 02/02/18 0713 02/02/18 1209 02/02/18 1510 02/02/18 1957 02/02/18 2311  02/03/18 0352  GLUCAP 181* 212* 193* 162* 145* 140*    Imaging No results found. STUDIES:  Strep Pneumo - negative Flu panel - negative RSVP - negative  CULTURES: 01/27/2018 Blood Culture: NG final 01/28/2018 Tracheal Aspirate: Rare G- cocci, few candida albicans 01/31/2018: BAL 10Kyeast  ANTIBIOTICS: Azithromycin 3/30 >> 4/3 Vancomycin 3/30 >> 4/1 Zosyn 3/30 >> 4/6  SIGNIFICANT EVENTS: 01/27/2018: Transferred from [facility]; intubated; CXR with bilateral infiltrates 01/28/2018: ARDS protocol; Nimbex started 01/30/2018: Nimbex stopped; required propofol > drop in BP and levo started 01/31/2018: Bronchoscopy   LINES/TUBES: RIJ 3/30 >> L Femoral A-line 3/30 >>  ASSESSMENT / PLAN: PULMONARY Acute resp failure, ARDS, aspiration S/P paralytics and bronchoscopy 4/3.   P: Wean sedation Pressure support weans as tolerated Stop Zosyn No need to treat yeast and BAL  CARDIOVASCULAR #Baseline - Known A Fib patient . ? on amio, lipitor, tricor lopresso, omega 3, coumadin at home.  #Admit - Circulatory shock at admit with fever (RVP and urine strep engative). Initially on levo but weaned off.  # Aortic and mitral valve disease - cardiology consulted, no work up this hospitalization recommended No new events. Diuresed well yesterday with 4.25 L UOP, net negative 1.5L. BP stable off pressors. CVP pending today (17 yesterday).   P: Follow CVP Continue heparin drip Continue Lasix.  Increase dose to 40 IV 3 times daily  RENAL #baseline:Normal renal function #currentAt admit - normal renal function AT risk for AKI given ARDS 4.5L UOP yestreday, net 1.5L out  P: Follow urine output and creatinine   GASTROINTESTINAL Constipation  P: contnue NPO Continue TFs PPI Increase lactulose, milk of magnesia Smog enema.  HEMATOLOGIC A:  #RBC:mild anemia of critical illness (admit hgb 3.5 spurious) - stable  #Plateletnormal #Leukocytosis  resolved]  P: Follow CBC  INFECTIOUS Likely sepstic shock based on fever and presentation. Source probably lung. On Zosyn. Finished Azithro. Vanc d/c'd. P: Finished Zosyn today  ENDOCRINE DM on glybrudie, insulin, metformin at home AT risk hypegluycemia  P: Continue Lantus, SSI  NEUROLOGIC #Baseline :Psych hx + - ? on trazadone, seroquel, vicodine, tegretol , cogentin, and tylonl # 3 at home #Current:admit for ARDS   P: Continue Precedex, wean down fentanyl  FAMILY  - Updates: No family at bedside   The patient is critically ill with multiple organ system failure and requires high complexity decision making for assessment and support, frequent evaluation and titration of therapies, advanced monitoring, review of radiographic studies and interpretation of complex data.   Critical Care Time devoted to patient care services, exclusive of separately  billable procedures, described in this note is 35 minutes.   Marshell Garfinkel MD [place] [facility] Pager [phone] If no answer or after 3pm call: [phone] 02/03/2018, 9:45 AM

## 2018-02-03 NOTE — Progress Notes (Signed)
Wasted 50ml of Versed. Witnessed by Ardyth Galemetria Parks, RN.

## 2018-02-04 ENCOUNTER — Inpatient Hospital Stay (HOSPITAL_COMMUNITY): Payer: Medicare Other

## 2018-02-04 LAB — GLUCOSE, CAPILLARY
GLUCOSE-CAPILLARY: 290 mg/dL — AB (ref 65–99)
GLUCOSE-CAPILLARY: 194 mg/dL — AB (ref 65–99)
Glucose-Capillary: 170 mg/dL — ABNORMAL HIGH (ref 65–99)
Glucose-Capillary: 242 mg/dL — ABNORMAL HIGH (ref 65–99)
Glucose-Capillary: 164 mg/dL — ABNORMAL HIGH (ref 65–99)

## 2018-02-04 LAB — BASIC METABOLIC PANEL
Anion gap: 12 (ref 5–15)
BUN: 20 mg/dL (ref 6–20)
CO2: 30 mmol/L (ref 22–32)
CREATININE: 0.97 mg/dL (ref 0.44–1.00)
Calcium: 8.1 mg/dL — ABNORMAL LOW (ref 8.9–10.3)
Chloride: 99 mmol/L — ABNORMAL LOW (ref 101–111)
GFR calc non Af Amer: >60 mL/min (ref >60)
GLUCOSE: 189 mg/dL — AB (ref 65–99)
Potassium: 3.7 mmol/L (ref 3.5–5.1)
Sodium: 141 mmol/L (ref 135–145)

## 2018-02-04 LAB — HEPARIN LEVEL (UNFRACTIONATED): HEPARIN UNFRACTIONATED: 0.52 [IU]/mL (ref 0.30–0.70)

## 2018-02-04 LAB — CBC
HCT: 23.4 % — ABNORMAL LOW (ref 36.0–46.0)
HEMOGLOBIN: 7.4 g/dL — AB (ref 12.0–15.0)
MCH: 31.1 pg (ref 26.0–34.0)
MCHC: 31.6 g/dL (ref 30.0–36.0)
MCV: 98.3 fL (ref 78.0–100.0)
Platelets: 189 10*3/uL (ref 150–400)
RBC: 2.38 MIL/uL — AB (ref 3.87–5.11)
RDW: 15.8 % — ABNORMAL HIGH (ref 11.5–15.5)
WBC: 7.6 10*3/uL (ref 4.0–10.5)

## 2018-02-04 LAB — PHOSPHORUS: PHOSPHORUS: 4.2 mg/dL (ref 2.5–4.6)

## 2018-02-04 LAB — MAGNESIUM: Magnesium: 1.9 mg/dL (ref 1.7–2.4)

## 2018-02-04 MED ORDER — FUROSEMIDE 10 MG/ML IJ SOLN
40.0000 mg | Freq: Two times a day (BID) | INTRAMUSCULAR | Status: AC
  Administered 2018-02-04 – 2018-02-06 (×5): 40 mg via INTRAVENOUS
  Filled 2018-02-04 (×5): qty 4

## 2018-02-04 MED ORDER — INSULIN ASPART 100 UNIT/ML ~~LOC~~ SOLN
0.0000 [IU] | SUBCUTANEOUS | Status: DC
  Administered 2018-02-04: 4 [IU] via SUBCUTANEOUS
  Administered 2018-02-04 (×2): 11 [IU] via SUBCUTANEOUS
  Administered 2018-02-04: 4 [IU] via SUBCUTANEOUS
  Administered 2018-02-04: 7 [IU] via SUBCUTANEOUS
  Administered 2018-02-04: 4 [IU] via SUBCUTANEOUS
  Administered 2018-02-05: 7 [IU] via SUBCUTANEOUS
  Administered 2018-02-05: 4 [IU] via SUBCUTANEOUS
  Administered 2018-02-05 (×2): 7 [IU] via SUBCUTANEOUS
  Administered 2018-02-05: 4 [IU] via SUBCUTANEOUS
  Administered 2018-02-05 – 2018-02-06 (×6): 7 [IU] via SUBCUTANEOUS
  Administered 2018-02-06 – 2018-02-07 (×2): 4 [IU] via SUBCUTANEOUS
  Administered 2018-02-07: 20 [IU] via SUBCUTANEOUS
  Administered 2018-02-07: 4 [IU] via SUBCUTANEOUS
  Administered 2018-02-07: 15 [IU] via SUBCUTANEOUS
  Administered 2018-02-07 – 2018-02-08 (×3): 4 [IU] via SUBCUTANEOUS

## 2018-02-04 MED ORDER — MAGNESIUM HYDROXIDE 400 MG/5ML PO SUSP: 5.0000 mL | Freq: Every day | ORAL | Status: DC | PRN

## 2018-02-04 NOTE — Progress Notes (Signed)
ANTICOAGULATION CONSULT NOTE - Follow Up Consult  Pharmacy Consult for heparin Indication: atrial fibrillation  Allergies  Allergen Reactions  . Sulfa Antibiotics Rash    Patient Measurements: Height: 5\' 2"  (157.5 cm) Weight: 186 lb 8.2 oz (84.6 kg) IBW/kg (Calculated) : 50.1 Heparin Dosing Weight: 68 kg  Vital Signs: Temp: 97.5 F (36.4 C) (04/07 1100) BP: 126/50 (04/07 1100) Pulse Rate: 77 (04/07 1100)  Labs: Recent Labs    02/02/18 0414 02/02/18 0500 02/03/18 0440 02/04/18 0350  HGB 7.5*  --  7.1* 7.4*  HCT 24.5*  --  22.6* 23.4*  PLT 196  --  173 189  HEPARINUNFRC  --  0.46 0.42 0.52  CREATININE 0.82  --   --  0.97    Estimated Creatinine Clearance: 66.9 mL/min (by C-G formula based on SCr of 0.97 mg/dL).  Medications: Heparin 2100 units/hr  Assessment: [age] yoF with a history of AFib on warfarin PTA found unresponsive at home. Pharmacy consulted to start IV heparin while holding oral AC.  Heparin level this morning remains therapeutic (HL 0.42, goal of 0.3-0.7). CBC low but stable - no overt bleeding noted at this time.   Goal of Therapy:  Heparin level 0.3-0.7 units/ml Monitor platelets by anticoagulation protocol: Yes   Plan:  -Continue heparin 2100 units/hr -Check daily heparin level and CBC -Monitor S/Sx bleeding  Thank you for allowing pharmacy to be a part of this patient's care.  Georgina PillionElizabeth Maitland Lesiak, PharmD, BCPS Clinical Pharmacist Pager: [phone] Clinical phone for 02/04/2018 from 7a-3:30p: [phone] If after 3:30p, please call main pharmacy at: [phone] 02/04/2018 11:48 AM

## 2018-02-04 NOTE — Progress Notes (Signed)
ANTICOAGULATION CONSULT NOTE - Follow Up Consult  Pharmacy Consult for heparin Indication: atrial fibrillation  Allergies  Allergen Reactions  . Sulfa Antibiotics Rash    Patient Measurements: Height: 5\' 2"  (157.5 cm) Weight: 186 lb 8.2 oz (84.6 kg) IBW/kg (Calculated) : 50.1 Heparin Dosing Weight: 68 kg  Vital Signs: Temp: 97.5 F (36.4 C) (04/07 1100) BP: 126/50 (04/07 1100) Pulse Rate: 77 (04/07 1100)  Labs: Recent Labs    02/02/18 0414 02/02/18 0500 02/03/18 0440 02/04/18 0350  HGB 7.5*  --  7.1* 7.4*  HCT 24.5*  --  22.6* 23.4*  PLT 196  --  173 189  HEPARINUNFRC  --  0.46 0.42 0.52  CREATININE 0.82  --   --  0.97    Estimated Creatinine Clearance: 66.9 mL/min (by C-G formula based on SCr of 0.97 mg/dL).  Medications: Heparin 2100 units/hr  Assessment: [age] yoF with a history of AFib on warfarin PTA found unresponsive at home. Pharmacy consulted to start IV heparin while holding oral AC.  Heparin level this morning remains therapeutic (HL 0.52, goal of 0.3-0.7). CBC low but stable - no overt bleeding noted at this time.   Goal of Therapy:  Heparin level 0.3-0.7 units/ml Monitor platelets by anticoagulation protocol: Yes   Plan:  -Continue heparin 2100 units/hr -Check daily heparin level and CBC -Monitor S/Sx bleeding  Thank you for allowing pharmacy to be a part of this patient's care.  Georgina PillionElizabeth Antionetta Ator, PharmD, BCPS Clinical Pharmacist Pager: [phone] Clinical phone for 02/04/2018 from 7a-3:30p: [phone] If after 3:30p, please call main pharmacy at: [phone] 02/04/2018 11:48 AM

## 2018-02-04 NOTE — Progress Notes (Addendum)
PULMONARY / CRITICAL CARE MEDICINE   Name: Natasha Martin MRN: [id number] DOB: 04/06/1961    ADMISSION DATE:  01/27/2018 CONSULTATION DATE:  01/27/2018  HISTORY OF PRESENT ILLNESS:   Essentially no immediate history on this 57 year old with diabetes felt mental delay and schizophrenia who also has a reported history of seizures in the chart. For the past 2-3 days she has been having a productive cough and she was found in the bathroom this morning unresponsive. When EMS arrived her saturations were in the 20s. She was taking to [facility] where she was intubated and chest x-ray has shown diffuse bilateral infiltrates.Admitted3/30/2019  She has a past medical history of Anxiety, Diabetes ([place]), Dysphagia, Hyperlipidemia, Hypertension, Mild mental slowing, Poor historian, Schizophrenia ([place]), and Seizures ([place]).has a past surgical history that includes Leg Surgery (Bilateral); Colonoscopy with propofol (N/A, 01/28/2014); Esophagogastroduodenoscopy (egd) with propofol (N/A, 01/28/2014); Savory dilation (N/A, 01/28/2014); polypectomy (01/28/2014); and biopsy (01/28/2014).reports that she has never smoked. She has never used smokeless tobacco.  SUBJECTIVE:  No issues overnight. She opens her eyes to voice and follows simple commands. Diuresing well on the increased lasix dose. Weaning sedation as able. Weaning trial.   VITAL SIGNS: BP (!) 124/48   Pulse 63   Temp 98.6 F (37 C)   Resp (!) 35   Ht _0  (1.575 m)   Wt 186 lb 8.2 oz (84.6 kg)   LMP 01/21/2014   SpO2 99%   BMI 34.11 kg/m   HEMODYNAMICS:    VENTILATOR SETTINGS: Vent Mode: PRVC FiO2 (%):  [40 %] 40 % Set Rate:  [35 bmp] 35 bmp Vt Set:  [400 mL] 400 mL PEEP:  [8 cmH20] 8 cmH20 Pressure Support:  [8 cmH20] 8 cmH20 Plateau Pressure:  [23 cmH20-26 cmH20] 26 cmH20  INTAKE / OUTPUT: I/O last 3 completed shifts: In: 4381.5 [I.V.:2031.5; NG/GT:1950; IV Piggyback:400] Out: 9800 [Urine:9800]  PHYSICAL  EXAMINATION: Gen:      No acute distress HEENT:  EOMI, sclera anicteric, ET tube Neck:     No masses; no thyromegaly Lungs:    Clear to auscultation bilaterally; normal respiratory effort CV:         Regular rate and rhythm; no murmurs Abd:      + bowel sounds; soft, non-tender; no palpable masses, mild distension Ext: 1+ edema,  adequate peripheral perfusion Skin:      Warm and dry; no rash Neuro:   Opens eyes to voice, following simple commands  LABS:  BMET Recent Labs  Lab 02/01/18 0837 02/02/18 0414 02/04/18 0350  NA 140 142 141  K 3.3* 4.0 3.7  CL 104 102 99*  CO2 _1 BUN _2 CREATININE 0.74 0.82 0.97  GLUCOSE 171* 190* 189*    Electrolytes Recent Labs  Lab 01/29/18 0200 01/29/18 1657  02/01/18 0837 02/01/18 1034 02/02/18 0414 02/04/18 0350  CALCIUM 6.8*  --    < > 7.0*  --  7.4* 8.1*  MG 1.8 1.6*  --   --  1.6* 1.9 1.9  PHOS 2.5 2.7  --   --   --   --  4.2   < > = values in this interval not displayed.    CBC Recent Labs  Lab 02/02/18 0414 02/03/18 0440 02/04/18 0350  WBC 10.3 8.2 7.6  HGB 7.5* 7.1* 7.4*  HCT 24.5* 22.6* 23.4*  PLT 196 173 189    Coag's Recent Labs  Lab 01/28/18 0920 01/29/18 0545 01/30/18 0251  INR  2.15 2.06 1.96    Sepsis Markers Recent Labs  Lab 01/28/18 0920 01/29/18 0200 01/30/18 0403 02/02/18 1059  LATICACIDVEN 0.8  --   --   --   PROCALCITON 0.40 0.37 0.49 <0.10    ABG Recent Labs  Lab 01/31/18 0800 02/01/18 0830 02/02/18 0410  PHART 7.437 7.350 7.395  PCO2ART 39.6 55.2* 58.1*  PO2ART 82.0* 81.0* 81.5*    Liver Enzymes Recent Labs  Lab 01/28/18 0920 01/29/18 0200  AST 82* 38  ALT 84* 58*  ALKPHOS 108 93  BILITOT 1.7* 1.1  ALBUMIN 1.9* 1.6*    Cardiac Enzymes Recent Labs  Lab 01/28/18 0920 01/28/18 1449 01/29/18 0200  TROPONINI <0.03 <0.03 <0.03  <0.03    Glucose Recent Labs  Lab 02/03/18 1201 02/03/18 1249 02/03/18 1640 02/03/18 1923 02/03/18 2344 02/04/18 0418   GLUCAP 268* 239* 196* 177* 261* 170*    Imaging No results found. STUDIES:  Strep Pneumo - negative Flu panel - negative RSVP - negative  CULTURES: 01/27/2018 Blood Culture: NG final 01/28/2018 Tracheal Aspirate: Rare G- cocci, few candida albicans 01/31/2018: BAL 10K yeast  ANTIBIOTICS: Azithromycin 3/30 >> 4/3 Vancomycin 3/30 >> 4/1 Zosyn 3/30 >> 4/6  SIGNIFICANT EVENTS: 01/27/2018: Transferred from [facility]; intubated; CXR with bilateral infiltrates 01/28/2018: ARDS protocol; Nimbex started 01/30/2018: Nimbex stopped; required propofol > drop in BP and levo started 01/31/2018: Bronchoscopy   LINES/TUBES: RIJ 3/30 >> L Femoral A-line 3/30 >>  ASSESSMENT / PLAN: PULMONARY Acute resp failure, ARDS, aspiration S/P paralytics and bronchoscopy 4/3 PCT now <0.1. Zosyn stopped yesterday. On fentanyl and precedex. Opening her eyes and following simple commands. Not fighting vent on lower sedation. Weaning trials. CXR today with some improvement.  P: Wean sedation able able; on fentanyl and precedex Pressure support weans as tolerated No need to treat yeast and BAL  CARDIOVASCULAR #Baseline - Known A Fib patient . ? on amio, lipitor, tricor lopresso, omega 3, coumadin at home.  #Admit - Circulatory shock at admit with fever (RVP and urine strep engative). Initially on levo but weaned off.  # Aortic and mitral valve disease - cardiology consulted, no work up this hospitalization recommended No new events. Diuresed well yesterday with 8 L UOP, net negative 5.5L. BP stable off pressors. CVP down trending. Cr trending up 0.74 > 0.82 > 0.97.  P: Follow CVP Continue heparin drip Continue Lasix.  Decrease dose back to 40 IV bid  RENAL #baseline:Normal renal function #currentAt admit - normal renal function AT risk for AKI given ARDSt  P: Follow urine output and creatinine   GASTROINTESTINAL Constipation:  large stool output with increased lactulose and  milk of mag (did not get enema yesterday)  P: contnue NPO Continue TFs PPI D/C lactulose milk of magnesia prn  HEMATOLOGIC A:  #RBC:mild anemia of critical illness (admit hgb 3.5 spurious) - stable  #Plateletnormal #Leukocytosis resolved  P: Follow CBC  INFECTIOUS Likely sepstic shock based on fever and presentation. Source probably lung. On Zosyn. Finished Azithro. Vanc d/c'd. PCT now < 0.1 P: Finished Zosyn yesterday  ENDOCRINE DM on glybrudie, insulin, metformin at home AT risk hypegluycemia  P: Continue Lantus, SSI  NEUROLOGIC #Baseline :Psych hx + - ? on trazadone, seroquel, vicodine, tegretol , cogentin, and tylonl # 3 at home #Current:admit for ARDS   P: Continue Precedex, wean down fentanyl  FAMILY  - Updates: No family at bedside   [place] Pager [phone] If no answer or after 3pm call:  [phone]  

## 2018-02-04 NOTE — Progress Notes (Signed)
eLink Physician-Brief Progress Note Patient Name: Natasha Martin DOB: February 20, 1961 MRN: [id number]   Date of Service  02/04/2018  HPI/Events of Note  Blood glucose = 261.  eICU Interventions  Will change SSI to Q 4 hour resistant Novolog SSI.     Intervention Category Major Interventions: Hyperglycemia - active titration of insulin therapy  Azaela Caracci Eugene 02/04/2018, 12:05 AM

## 2018-02-05 ENCOUNTER — Inpatient Hospital Stay (HOSPITAL_COMMUNITY): Payer: Medicare Other

## 2018-02-05 DIAGNOSIS — J9601 Acute respiratory failure with hypoxia: Secondary | ICD-10-CM

## 2018-02-05 LAB — CBC
HCT: 24.7 % — ABNORMAL LOW (ref 36.0–46.0)
Hemoglobin: 7.7 g/dL — ABNORMAL LOW (ref 12.0–15.0)
MCH: 30.9 pg (ref 26.0–34.0)
MCHC: 31.2 g/dL (ref 30.0–36.0)
MCV: 99.2 fL (ref 78.0–100.0)
PLATELETS: 207 10*3/uL (ref 150–400)
RBC: 2.49 MIL/uL — ABNORMAL LOW (ref 3.87–5.11)
RDW: 15.7 % — ABNORMAL HIGH (ref 11.5–15.5)
WBC: 8.1 10*3/uL (ref 4.0–10.5)

## 2018-02-05 LAB — BASIC METABOLIC PANEL
Anion gap: 10 (ref 5–15)
BUN: 22 mg/dL — AB (ref 6–20)
CO2: 29 mmol/L (ref 22–32)
CREATININE: 0.91 mg/dL (ref 0.44–1.00)
Calcium: 8.4 mg/dL — ABNORMAL LOW (ref 8.9–10.3)
Chloride: 100 mmol/L — ABNORMAL LOW (ref 101–111)
GFR calc Af Amer: >60 mL/min (ref >60)
GLUCOSE: 258 mg/dL — AB (ref 65–99)
Potassium: 3.7 mmol/L (ref 3.5–5.1)
SODIUM: 139 mmol/L (ref 135–145)

## 2018-02-05 LAB — GLUCOSE, CAPILLARY
GLUCOSE-CAPILLARY: 220 mg/dL — AB (ref 65–99)
Glucose-Capillary: 158 mg/dL — ABNORMAL HIGH (ref 65–99)
Glucose-Capillary: 191 mg/dL — ABNORMAL HIGH (ref 65–99)
Glucose-Capillary: 198 mg/dL — ABNORMAL HIGH (ref 65–99)
Glucose-Capillary: 205 mg/dL — ABNORMAL HIGH (ref 65–99)
Glucose-Capillary: 209 mg/dL — ABNORMAL HIGH (ref 65–99)
Glucose-Capillary: 209 mg/dL — ABNORMAL HIGH (ref 65–99)

## 2018-02-05 LAB — HEPARIN LEVEL (UNFRACTIONATED): Heparin Unfractionated: 0.53 IU/mL (ref 0.30–0.70)

## 2018-02-05 MED ORDER — ACETYLCYSTEINE 20 % IN SOLN
4.0000 mL | Freq: Three times a day (TID) | RESPIRATORY_TRACT | Status: DC
  Administered 2018-02-05: 4 mL via RESPIRATORY_TRACT
  Filled 2018-02-05 (×3): qty 4

## 2018-02-05 MED ORDER — CHLORHEXIDINE GLUCONATE 0.12 % MT SOLN
15.0000 mL | Freq: Two times a day (BID) | OROMUCOSAL | Status: DC
  Administered 2018-02-05 – 2018-02-13 (×16): 15 mL via OROMUCOSAL
  Filled 2018-02-05 (×13): qty 15

## 2018-02-05 MED ORDER — RACEPINEPHRINE HCL 2.25 % IN NEBU
0.5000 mL | INHALATION_SOLUTION | RESPIRATORY_TRACT | Status: DC | PRN
  Administered 2018-02-05 – 2018-02-06 (×3): 0.5 mL via RESPIRATORY_TRACT
  Filled 2018-02-05 (×4): qty 0.5

## 2018-02-05 MED ORDER — ORAL CARE MOUTH RINSE
15.0000 mL | Freq: Two times a day (BID) | OROMUCOSAL | Status: DC
Start: 2018-02-05 — End: 2018-02-13
  Administered 2018-02-06 – 2018-02-12 (×14): 15 mL via OROMUCOSAL

## 2018-02-05 MED ORDER — FUROSEMIDE 10 MG/ML IJ SOLN
80.0000 mg | Freq: Once | INTRAMUSCULAR | Status: AC
  Administered 2018-02-05: 80 mg via INTRAVENOUS
  Filled 2018-02-05: qty 8

## 2018-02-05 MED ORDER — INSULIN GLARGINE 100 UNIT/ML ~~LOC~~ SOLN
28.0000 [IU] | Freq: Every day | SUBCUTANEOUS | Status: DC
  Administered 2018-02-05: 28 [IU] via SUBCUTANEOUS
  Filled 2018-02-05 (×2): qty 0.28

## 2018-02-05 MED ORDER — DEXAMETHASONE SODIUM PHOSPHATE 10 MG/ML IJ SOLN: 10.0000 mg | Freq: Four times a day (QID) | INTRAMUSCULAR | Status: DC

## 2018-02-05 MED ORDER — INSULIN ASPART 100 UNIT/ML ~~LOC~~ SOLN: 3.0000 [IU] | SUBCUTANEOUS | Status: DC

## 2018-02-05 MED ORDER — RACEPINEPHRINE HCL 2.25 % IN NEBU
INHALATION_SOLUTION | RESPIRATORY_TRACT | Status: AC
  Filled 2018-02-05: qty 0.5

## 2018-02-05 MED ORDER — DEXAMETHASONE SODIUM PHOSPHATE 4 MG/ML IJ SOLN
4.0000 mg | Freq: Four times a day (QID) | INTRAMUSCULAR | Status: AC
  Administered 2018-02-05 – 2018-02-07 (×8): 4 mg via INTRAVENOUS
  Filled 2018-02-05 (×10): qty 1

## 2018-02-05 MED ORDER — POTASSIUM CHLORIDE 20 MEQ/15ML (10%) PO SOLN
40.0000 meq | Freq: Once | ORAL | Status: AC
  Administered 2018-02-05: 40 meq
  Filled 2018-02-05: qty 30

## 2018-02-05 MED ORDER — IPRATROPIUM-ALBUTEROL 0.5-2.5 (3) MG/3ML IN SOLN
3.0000 mL | Freq: Three times a day (TID) | RESPIRATORY_TRACT | Status: DC
  Administered 2018-02-05: 3 mL via RESPIRATORY_TRACT
  Filled 2018-02-05: qty 3

## 2018-02-05 MED ORDER — IPRATROPIUM-ALBUTEROL 0.5-2.5 (3) MG/3ML IN SOLN: 3.0000 mL | Freq: Three times a day (TID) | RESPIRATORY_TRACT | Status: DC

## 2018-02-05 NOTE — Progress Notes (Signed)
ANTICOAGULATION CONSULT NOTE - Follow Up Consult  Pharmacy Consult for heparin Indication: atrial fibrillation  Allergies  Allergen Reactions  . Sulfa Antibiotics Rash    Patient Measurements: Height: 5\' 2"  (157.5 cm) Weight: 171 lb 1.2 oz (77.6 kg) IBW/kg (Calculated) : 50.1 Heparin Dosing Weight: 68 kg  Vital Signs: Temp: 97.7 F (36.5 C) (04/08 1200) Temp Source: Core (04/08 0600) BP: 140/54 (04/08 1200) Pulse Rate: 82 (04/08 1200)  Labs: Recent Labs    02/03/18 0440 02/04/18 0350 02/05/18 0449  HGB 7.1* 7.4* 7.7*  HCT 22.6* 23.4* 24.7*  PLT 173 189 207  HEPARINUNFRC 0.42 0.52 0.53  CREATININE  --  0.97 0.91    Estimated Creatinine Clearance: 68.2 mL/min (by C-G formula based on SCr of 0.91 mg/dL).  Medications: Heparin 2100 units/hr  Assessment: 89 yoF with a history of AFib on warfarin PTA found unresponsive at home. Pharmacy consulted to start IV heparin while holding oral AC.  Heparin level this morning remains therapeutic (HL 0.53, goal of 0.3-0.7). CBC low but stable - no overt bleeding noted at this time.   Goal of Therapy:  Heparin level 0.3-0.7 units/ml Monitor platelets by anticoagulation protocol: Yes   Plan:  -Continue heparin 2100 units/hr -Check daily heparin level and CBC -Monitor S/Sx bleeding  Thank you for allowing pharmacy to be a part of this patient's care.  Fredonia HighlandMichael Chistian Kasler, PharmD, BCPS PGY-2 Cardiology Pharmacy Resident Pager: [phone] 02/05/2018

## 2018-02-05 NOTE — Progress Notes (Signed)
PULMONARY / CRITICAL CARE MEDICINE   Name: Natasha Martin MRN: [id number] DOB: 01-09-1961    ADMISSION DATE:  01/27/2018 CONSULTATION DATE:  01/27/2018  HISTORY OF PRESENT ILLNESS:   Essentially no immediate history on this 57 year old with diabetes felt mental delay and schizophrenia who also has a reported history of seizures in the chart. For the past 2-3 days she has been having a productive cough and she was found in the bathroom this morning unresponsive. When EMS arrived her saturations were in the 20s. She was taking to [facility] where she was intubated and chest x-ray has shown diffuse bilateral infiltrates.Admitted3/30/2019  She has a past medical history of Anxiety, Diabetes ([place]), Dysphagia, Hyperlipidemia, Hypertension, Mild mental slowing, Poor historian, Schizophrenia ([place]), and Seizures ([place]).has a past surgical history that includes Leg Surgery (Bilateral); Colonoscopy with propofol (N/A, 01/28/2014); Esophagogastroduodenoscopy (egd) with propofol (N/A, 01/28/2014); Savory dilation (N/A, 01/28/2014); polypectomy (01/28/2014); and biopsy (01/28/2014).reports that she has never smoked. She has never used smokeless tobacco.  SUBJECTIVE:  No events overnight. Tolerated pressure support 5/5 for 12 hours yesterday without issues.   VITAL SIGNS: BP 109/63   Pulse (!) 52   Temp 99.5 F (37.5 C) (Core)   Resp (!) 35   Ht _0  (1.575 m)   Wt 171 lb 1.2 oz (77.6 kg)   LMP 01/21/2014   SpO2 100%   BMI 31.29 kg/m   HEMODYNAMICS: CVP:  [8 mmHg] 8 mmHg  VENTILATOR SETTINGS: Vent Mode: PRVC FiO2 (%):  [40 %] 40 % Set Rate:  [35 bmp] 35 bmp Vt Set:  [400 mL] 400 mL PEEP:  [5 cmH20-8 cmH20] 5 cmH20 Pressure Support:  [5 cmH20] 5 cmH20 Plateau Pressure:  [22 cmH20-23 cmH20] 23 cmH20  INTAKE / OUTPUT: I/O last 3 completed shifts: In: 4067.4 [I.V.:1737.4; NG/GT:1930; IV Piggyback:400] Out: 8065 [Urine:8065]  PHYSICAL EXAMINATION: Gen:      No acute  distress HEENT:  EOMI, sclera anicteric, ET tube Neck:     No masses; no thyromegaly Lungs:    Clear to auscultation bilaterally; normal respiratory effort CV:         Regular rate and rhythm; no murmurs Abd:      + bowel sounds; soft, non-tender; no palpable masses, mild distension Ext:    trace edema,  adequate peripheral perfusion Skin:      Warm and dry; no rash Neuro:   Opens eyes to voice, following simple commands  LABS:  BMET Recent Labs  Lab 02/02/18 0414 02/04/18 0350 02/05/18 0449  NA 142 141 139  K 4.0 3.7 3.7  CL 102 99* 100*  CO2 _1 BUN 10 20 22*  CREATININE 0.82 0.97 0.91  GLUCOSE 190* 189* 258*    Electrolytes Recent Labs  Lab 01/29/18 1657  02/01/18 1034 02/02/18 0414 02/04/18 0350 02/05/18 0449  CALCIUM  --    < >  --  7.4* 8.1* 8.4*  MG 1.6*  --  1.6* 1.9 1.9  --   PHOS 2.7  --   --   --  4.2  --    < > = values in this interval not displayed.    CBC Recent Labs  Lab 02/03/18 0440 02/04/18 0350 02/05/18 0449  WBC 8.2 7.6 8.1  HGB 7.1* 7.4* 7.7*  HCT 22.6* 23.4* 24.7*  PLT 173 189 207    Coag's Recent Labs  Lab 01/30/18 0251  INR 1.96    Sepsis Markers Recent Labs  Lab 01/30/18 0403 02/02/18  1059  PROCALCITON 0.49 <0.10    ABG Recent Labs  Lab 01/31/18 0800 02/01/18 0830 02/02/18 0410  PHART 7.437 7.350 7.395  PCO2ART 39.6 55.2* 58.1*  PO2ART 82.0* 81.0* 81.5*    Liver Enzymes No results for input(s): AST, ALT, ALKPHOS, BILITOT, ALBUMIN in the last 168 hours.  Cardiac Enzymes No results for input(s): TROPONINI, PROBNP in the last 168 hours.  Glucose Recent Labs  Lab 02/04/18 0837 02/04/18 1227 02/04/18 1549 02/04/18 1918 02/05/18 0033 02/05/18 0336  GLUCAP 290* 242* 164* 194* 209* 220*    Imaging Dg Chest Port 1 View  Result Date: 02/05/2018 CLINICAL DATA:  ARDS EXAM: PORTABLE CHEST 1 VIEW COMPARISON:  02/04/2018. FINDINGS: No change BILATERAL pulmonary opacities. Support tubes and lines are  stable. No pneumothorax. Chronic deformity RIGHT shoulder. IMPRESSION: Stable exam. Electronically Signed   By: Staci Righter M.D.   On: 02/05/2018 07:18   STUDIES:  Strep Pneumo - negative Flu panel - negative RSVP - negative  CULTURES: 01/27/2018 Blood Culture: NG final 01/28/2018 Tracheal Aspirate: Rare G- cocci, few candida albicans 01/31/2018: BAL 10K yeast  ANTIBIOTICS: Azithromycin 3/30 >> 4/3 Vancomycin 3/30 >> 4/1 Zosyn 3/30 >> 4/6  SIGNIFICANT EVENTS: 01/27/2018: Transferred from [facility]; intubated; CXR with bilateral infiltrates 01/28/2018: ARDS protocol; Nimbex started 01/30/2018: Nimbex stopped; required propofol > drop in BP and levo started 01/31/2018: Bronchoscopy   LINES/TUBES: RIJ 3/30 >> L Femoral A-line 3/30 >>  ASSESSMENT / PLAN: PULMONARY Acute resp failure, ARDS, aspiration Flu negative S/P NMB on admission S/P bronchoscopy 4/3 > yeast  Weaning sedation. Tolerated pressure support 5/5 for 12 hours yesterday. CXR today with continued improvement.   P: Continue to wean sedation able able; on fentanyl and precedex SBT today  CARDIOVASCULAR #Baseline - Known A Fib patient . ? on amio, lipitor, tricor lopresso, omega 3, coumadin at home.  #Admit - Circulatory shock at admit with fever (RVP and urine strep engative). Initially on levo but weaned off.  # Aortic and mitral valve disease - cardiology consulted, no work up this hospitalization recommended No new events. 5 L UOP.  BP remains stable. CVP down trending. Cr stable at 0.91.  P: Follow CVP Continue heparin drip Continue Lasix at 40 IV bid  RENAL #baseline:Normal renal function #currentAt admit - normal renal function AT risk for AKI given ARDSt  P: Follow urine output and creatinine   GASTROINTESTINAL Constipation:  large stool output with increased lactulose and milk of mag (did not get enema)  P: contnue NPO Continue TFs PPI milk of magnesia  prn  HEMATOLOGIC A:  #RBC:mild anemia of critical illness (admit hgb 3.5 spurious) - stable  #Plateletnormal #Leukocytosis resolved  P: Follow CBC  INFECTIOUS Likely sepstic shock based on fever and presentation. Source probably lung. On Zosyn. Finished Azithro. Vanc d/c'd. PCT now < 0.1 P: Finished Zosyn 4/6  ENDOCRINE DM on glybrudie, insulin, metformin at home AT risk hypegluycemia CBGs in the 200s  P: Increase Lantus 25 > 28, SSI  NEUROLOGIC #Baseline :Psych hx + - ? on trazadone, seroquel, vicodine, tegretol , cogentin, and tylonl # 3 at home #Current:admit for ARDS   P: Continue Precedex, wean down fentanyl  FAMILY  - Updates: No family at bedside   [place] [facility] Pager [phone] If no answer or after 3pm call: [phone]

## 2018-02-05 NOTE — Progress Notes (Signed)
Nutrition Follow-up  DOCUMENTATION CODES:   Obesity unspecified  INTERVENTION:   Diet advancement as able after swallow evaluation.  NUTRITION DIAGNOSIS:   Inadequate oral intake related to inability to eat as evidenced by NPO status.  Ongoing  GOAL:   Provide needs based on ASPEN/SCCM guidelines  Unmet  MONITOR:   Diet advancement, PO intake, I & O's, Skin  ASSESSMENT:   57 yo female with PMH of DM, HLD, schizophrenia, mental delay, anxiety, HTN, dysphagia, and seizures who was admitted on 3/30 with ARDS after being found unresponsive; 2-3 day hx of productive cough; [facility]  Discussed patient in ICU rounds and with RN today. Patient was just extubated. TF off. Bedside swallow evaluation with SLP has been ordered. Labs and medications reviewed.  Diet Order:  Diet NPO time specified  EDUCATION NEEDS:   No education needs have been identified at this time  Skin:  Skin Assessment: (dry, scabbed areas to coccyx)  Last BM:  4/7  Height:   Ht Readings from Last 1 Encounters:  01/27/18 5\' 2"  (1.575 m)    Weight:   Wt Readings from Last 1 Encounters:  02/05/18 171 lb 1.2 oz (77.6 kg)    Ideal Body Weight:  50 kg  BMI:  Body mass index is 31.29 kg/m.  Estimated Nutritional Needs:   Kcal:  1400-1600  Protein:  80-90 gm  Fluid:  1.6 L    Joaquin CourtsKimberly Harris, RD, LDN, CNSC Pager [phone] After Hours Pager [phone]

## 2018-02-05 NOTE — Progress Notes (Signed)
Pt w/ inspiratory wheezes and stridor, labored breathing upon examination, states "can't breathe". Spoke w/ RT and covering resident. Both at bedside.

## 2018-02-05 NOTE — Progress Notes (Signed)
Wasted 200cc of fentanyl in the sink with April Lewis. Wannetta SenderJessica T Anastasios Melander, RN 02/05/2018 5:13 PM

## 2018-02-05 NOTE — Progress Notes (Signed)
Placed patient on cool mist aerosol face mask

## 2018-02-05 NOTE — Progress Notes (Signed)
SLP Note  Patient Details Name: Natasha Martin MRN: [id number] DOB: 23-Jan-1961   Cancelled evaluation:       Reason Eval/Treat Not Completed: Other (comment)(Orders received for BSE post-extubation. ) Pt continues to be intubated at this time. Will continue efforts.  Shelie Lansing B. Murvin NatalBueche, Mary[employer] IncorporatedMSP, CCC-SLP Speech Language Pathologist [phone]  Leigh AuroraBueche, Robie Mcniel Brown 02/05/2018, 2:06 PM

## 2018-02-05 NOTE — Progress Notes (Signed)
Post extubation has stridro -mild QTc 456  Plan Racemic epi x 1 Decadron NPO  Monitor closely

## 2018-02-05 NOTE — Procedures (Signed)
Extubation Procedure Note  Patient Details:   Name: Natasha Martin DOB: 06-09-1961 MRN: [id number]   Airway Documentation:   Patient extubated per MD orders. Had positive cuff leak prior to extubation. Placed patient on 4lpm humidified oxygen. Patient has strong cough and able to voice.  Evaluation  O2 sats: stable throughout Complications: No apparent complications Patient did tolerate procedure well. Bilateral Breath Sounds: Clear, Diminished   Yes  Suszanne ConnersLaura P Iliya Spivack 02/05/2018, 2:09 PM

## 2018-02-06 ENCOUNTER — Encounter (HOSPITAL_COMMUNITY): Payer: Self-pay

## 2018-02-06 LAB — BASIC METABOLIC PANEL
ANION GAP: 13 (ref 5–15)
BUN: 20 mg/dL (ref 6–20)
CHLORIDE: 99 mmol/L — AB (ref 101–111)
CO2: 28 mmol/L (ref 22–32)
Calcium: 9.1 mg/dL (ref 8.9–10.3)
Creatinine, Ser: 0.9 mg/dL (ref 0.44–1.00)
GFR calc non Af Amer: >60 mL/min (ref >60)
GLUCOSE: 208 mg/dL — AB (ref 65–99)
Potassium: 3.9 mmol/L (ref 3.5–5.1)
Sodium: 140 mmol/L (ref 135–145)

## 2018-02-06 LAB — GLUCOSE, CAPILLARY
GLUCOSE-CAPILLARY: 201 mg/dL — AB (ref 65–99)
GLUCOSE-CAPILLARY: 213 mg/dL — AB (ref 65–99)
GLUCOSE-CAPILLARY: 183 mg/dL — AB (ref 65–99)
Glucose-Capillary: 209 mg/dL — ABNORMAL HIGH (ref 65–99)
Glucose-Capillary: 227 mg/dL — ABNORMAL HIGH (ref 65–99)
Glucose-Capillary: 235 mg/dL — ABNORMAL HIGH (ref 65–99)
Glucose-Capillary: 235 mg/dL — ABNORMAL HIGH (ref 65–99)

## 2018-02-06 LAB — CBC
HEMATOCRIT: 30.2 % — AB (ref 36.0–46.0)
HEMOGLOBIN: 9.3 g/dL — AB (ref 12.0–15.0)
MCH: 30.4 pg (ref 26.0–34.0)
MCHC: 30.8 g/dL (ref 30.0–36.0)
MCV: 98.7 fL (ref 78.0–100.0)
Platelets: 288 10*3/uL (ref 150–400)
RBC: 3.06 MIL/uL — AB (ref 3.87–5.11)
RDW: 15.5 % (ref 11.5–15.5)
WBC: 8.8 10*3/uL (ref 4.0–10.5)

## 2018-02-06 LAB — PHOSPHORUS: Phosphorus: 4.3 mg/dL (ref 2.5–4.6)

## 2018-02-06 LAB — MAGNESIUM: Magnesium: 1.9 mg/dL (ref 1.7–2.4)

## 2018-02-06 LAB — HEPARIN LEVEL (UNFRACTIONATED)
HEPARIN UNFRACTIONATED: 1.26 [IU]/mL — AB (ref 0.30–0.70)
Heparin Unfractionated: 1.09 IU/mL — ABNORMAL HIGH (ref 0.30–0.70)
Heparin Unfractionated: 0.7 IU/mL (ref 0.30–0.70)

## 2018-02-06 MED ORDER — MAGNESIUM SULFATE IN D5W 1-5 GM/100ML-% IV SOLN
1.0000 g | Freq: Once | INTRAVENOUS | Status: AC
  Administered 2018-02-06: 1 g via INTRAVENOUS
  Filled 2018-02-06: qty 100

## 2018-02-06 MED ORDER — IPRATROPIUM-ALBUTEROL 0.5-2.5 (3) MG/3ML IN SOLN: 3.0000 mL | RESPIRATORY_TRACT | Status: DC | PRN

## 2018-02-06 MED ORDER — PANTOPRAZOLE SODIUM 40 MG PO PACK
40.0000 mg | PACK | Freq: Every day | ORAL | Status: DC
  Filled 2018-02-06: qty 20

## 2018-02-06 MED ORDER — SENNOSIDES 8.8 MG/5ML PO SYRP
5.0000 mL | ORAL_SOLUTION | Freq: Every day | ORAL | Status: DC
  Administered 2018-02-07 – 2018-02-08 (×2): 5 mL via ORAL
  Filled 2018-02-06 (×2): qty 5

## 2018-02-06 MED ORDER — PANTOPRAZOLE SODIUM 40 MG IV SOLR
40.0000 mg | INTRAVENOUS | Status: DC
  Administered 2018-02-06 – 2018-02-08 (×3): 40 mg via INTRAVENOUS
  Filled 2018-02-06 (×3): qty 40

## 2018-02-06 MED ORDER — POTASSIUM CHLORIDE 10 MEQ/100ML IV SOLN
10.0000 meq | INTRAVENOUS | Status: AC
  Administered 2018-02-06 (×2): 10 meq via INTRAVENOUS
  Filled 2018-02-06 (×2): qty 100

## 2018-02-06 MED ORDER — RACEPINEPHRINE HCL 2.25 % IN NEBU
0.5000 mL | INHALATION_SOLUTION | Freq: Once | RESPIRATORY_TRACT | Status: AC
  Administered 2018-02-06: 0.5 mL via RESPIRATORY_TRACT

## 2018-02-06 MED ORDER — HEPARIN (PORCINE) IN NACL 100-0.45 UNIT/ML-% IJ SOLN
1400.0000 [IU]/h | INTRAMUSCULAR | Status: DC
  Administered 2018-02-06: 1850 [IU]/h via INTRAVENOUS
  Administered 2018-02-07 – 2018-02-08 (×2): 1500 [IU]/h via INTRAVENOUS
  Administered 2018-02-09 – 2018-02-12 (×3): 1300 [IU]/h via INTRAVENOUS
  Filled 2018-02-06 (×9): qty 250

## 2018-02-06 MED ORDER — INSULIN GLARGINE 100 UNIT/ML ~~LOC~~ SOLN
30.0000 [IU] | Freq: Every day | SUBCUTANEOUS | Status: DC
  Administered 2018-02-06 – 2018-02-13 (×8): 30 [IU] via SUBCUTANEOUS
  Filled 2018-02-06 (×9): qty 0.3

## 2018-02-06 NOTE — Evaluation (Signed)
Clinical/Bedside Swallow Evaluation Patient Details  Name: Natasha Martin MRN: [id number] Date of Birth: 1961/09/01  Today's Date: 02/06/2018 Time: SLP Start Time (ACUTE ONLY): 0830 SLP Stop Time (ACUTE ONLY): 0843 SLP Time Calculation (min) (ACUTE ONLY): 13 min  Past Medical History:  Past Medical History:  Diagnosis Date  . Anxiety   . Diabetes (HCC)   . Dysphagia   . Hyperlipidemia   . Hypertension   . Mild mental slowing   . Poor historian   . Schizophrenia (HCC)   . Seizures (HCC)    has not had a seizure since last year. Unknown orgin- no family available to come and patient does not know.   Past Surgical History:  Past Surgical History:  Procedure Laterality Date  . BIOPSY  01/28/2014   Procedure: BIOPSY (Gastric);  Surgeon: West Bali, MD;  Location: [facility];  Service: Endoscopy;;  . COLONOSCOPY WITH PROPOFOL N/A 01/28/2014   WUJ:WJXBJY mucosa in the terminal ileum/small internal and external hemorrhoids  . ESOPHAGOGASTRODUODENOSCOPY (EGD) WITH PROPOFOL N/A 01/28/2014   NWG:NFAOZH esophagitis/stricture at the gastro junction/moderate erosive  . 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 POLYPECTOMY  01/28/2014   Procedure: POLYPECTOMY (Rectal);  Surgeon: West Bali, MD;  Location: [facility];  Service: Endoscopy;;  . SAVORY DILATION N/A 01/28/2014   Procedure: SAVORY DILATION  (12.40mm to 16mm);  Surgeon: West Bali, MD;  Location: [facility];  Service: Endoscopy;  Laterality: N/A;   HPI:  Pt is a 57 year old who presented to [facility] after being found unresponsive with report of productive cough, mental delay, and schizophrenia. She has a past medical history of Anxiety, Diabetes (HCC), Dysphagia, Hyperlipidemia, Hypertension, Mild mental slowing, Poor historian, Schizophrenia (HCC), and Seizures (HCC). Surgical Hx includes Esophagogastroduodenoscopy (egd) with propofol (01/28/2014) and Savory dilation (01/28/2014). When EMS  arrived her saturations were in the 20s. Pt was was intubated on arrival (01/27/18) and chest x-ray has showed diffuse bilateral infiltrates. ARDSlikely 2/2 aspirationevent. Extubated 4/8. CXR (4/9) reveals No change to BILATERAL pulmonary opacities. CT Head (3/30) revealed no evidence of acute intracranial abnormality and mild chronic small-vessel white matter ischemic changes.    Assessment / Plan / Recommendation Clinical Impression   Pt presents with moderate to severely reduced vocal quality, weak cough, fluctuating respiratory status, and persistent altered mental status. Pt consumed limited ice chips and teaspoon sips of thin liquid with observed multiple, effortful swallows. Pt did not cough/clear throat with trials, but given recent intubation and current vocal/cough quality pt is at increased risk for silent aspiration. Recommend continued NPO status with limited ice chips after oral care from RN. SLP will follow-up to determine readiness to advance to diet vs likely need for instrumental testing.     SLP Visit Diagnosis: Dysphagia, unspecified (R13.10)    Aspiration Risk  Moderate aspiration risk    Diet Recommendation NPO;Ice chips PRN after oral care;Other (Comment)   Medication Administration: Other (Comment)(meds per MD orders)    Other  Recommendations Oral Care Recommendations: Oral care QID;Oral care prior to ice chip/H20   Follow up Recommendations Other (comment)(tbd)      Frequency and Duration min 2x/week  2 weeks       Prognosis Prognosis for Safe Diet Advancement: Fair Barriers to Reach Goals: Cognitive deficits      Swallow Study   General HPI: Pt is a 57 year old who presented to [facility] after being found unresponsive with  report of productive cough, mental delay, and schizophrenia. She has a past medical history of Anxiety, Diabetes (HCC), Dysphagia, Hyperlipidemia, Hypertension, Mild mental slowing, Poor historian, Schizophrenia (HCC), and  Seizures (HCC). Surgical Hx includes Esophagogastroduodenoscopy (egd) with propofol (01/28/2014) and Savory dilation (01/28/2014). When EMS arrived her saturations were in the 20s. Pt was was intubated on arrival (01/27/18) and chest x-ray has showed diffuse bilateral infiltrates. ARDSlikely 2/2 aspirationevent. Extubated 4/8. CXR (4/9) reveals No change to BILATERAL pulmonary opacities. CT Head (3/30) revealed no evidence of acute intracranial abnormality and mild chronic small-vessel white matter ischemic changes.  Type of Study: Bedside Swallow Evaluation Previous Swallow Assessment: none in chart Diet Prior to this Study: NPO Temperature Spikes Noted: No Respiratory Status: Venti-mask History of Recent Intubation: Yes Length of Intubations (days): 10 days Date extubated: 02/05/18 Behavior/Cognition: Alert;Confused;Requires cueing Oral Cavity Assessment: Within Functional Limits Oral Care Completed by SLP: Recent completion by staff Oral Cavity - Dentition: Adequate natural dentition Self-Feeding Abilities: Needs assist Patient Positioning: Upright in bed Baseline Vocal Quality: Hoarse;Low vocal intensity Volitional Cough: Weak Volitional Swallow: Able to elicit    Oral/Motor/Sensory Function Overall Oral Motor/Sensory Function: Within functional limits(despite limited exam 2/2 altered mental status )   Ice Chips Ice chips: Impaired Presentation: Spoon Oral Phase Functional Implications: Right anterior spillage Pharyngeal Phase Impairments: Multiple swallows(effortful swallows)   Thin Liquid Thin Liquid: Impaired Presentation: Spoon Oral Phase Functional Implications: Right anterior spillage Pharyngeal  Phase Impairments: Multiple swallows;Other (comments)(effortful swallows)    Nectar Thick Nectar Thick Liquid: Not tested   Honey Thick Honey Thick Liquid: Not tested   Puree Puree: Not tested   Solid   GO   Solid: Not tested       [place] Arlana Canizales SLP Student  Clinician  [place] Hendricks Schwandt 02/06/2018,9:39 AM

## 2018-02-06 NOTE — Plan of Care (Signed)
  Problem: Clinical Measurements: Goal: Ability to maintain clinical measurements within normal limits will improve Outcome: Progressing   Problem: Respiratory: Goal: Ability to maintain a clear airway and adequate ventilation will improve Outcome: Progressing

## 2018-02-06 NOTE — Progress Notes (Signed)
PULMONARY / CRITICAL CARE MEDICINE   Name: Natasha Martin MRN: [id number] DOB: 1961-06-09    ADMISSION DATE:  01/27/2018 CONSULTATION DATE:  01/27/2018  Brief History:   Essentially no immediate history on this 57 year old with diabetes felt mental delay and schizophrenia who also has a reported history of seizures in the chart. For the past 2-3 days she has been having a productive cough and she was found in the bathroom this morning unresponsive. When EMS arrived her saturations were in the 20s. She was taking to [facility] where she was intubated and chest x-ray has shown diffuse bilateral infiltrates.Admitted3/30/2019  She has a past medical history of Anxiety, Diabetes ([place]), Dysphagia, Hyperlipidemia, Hypertension, Mild mental slowing, Poor historian, Schizophrenia ([place]), and Seizures ([place]).has a past surgical history that includes Leg Surgery (Bilateral); Colonoscopy with propofol (N/A, 01/28/2014); Esophagogastroduodenoscopy (egd) with propofol (N/A, 01/28/2014); Savory dilation (N/A, 01/28/2014); polypectomy (01/28/2014); and biopsy (01/28/2014).reports that she has never smoked. She has never used smokeless tobacco.  Significant Events: 01/27/2018: Transferred from [facility]; intubated; CXR with bilateral infiltrates 01/28/2018: ARDS protocol; Nimbex started 01/30/2018: Nimbex stopped; required propofol > drop in BP and levo started 01/31/2018: Bronchoscopy (cultures with yeast only) 02/05/2018: Extubated, A-line out  Interval History:  Patient off all sedation (was on precedex and fentanyl) and extubated on 4/8. Developed post extubation stridor. Responded to racemic epi nebs but still requiring 10L O2 with aerosol mask. Continues to diurese well with additional 7.5 UOP on 4/8 with net negative 4.2 L for hospitalization. QTc 459 on EKG 4/8. Telemetry with QTc 494 this morning (likely overestimate).  VITAL SIGNS: BP (!) 160/70   Pulse 90   Temp 97.9 F (36.6 C) (Core)    Resp (!) 22   Ht _0  (1.575 m)   Wt 160 lb 4.4 oz (72.7 kg)   LMP 01/21/2014   SpO2 96%   BMI 29.31 kg/m   HEMODYNAMICS:    VENTILATOR SETTINGS: Vent Mode: CPAP;PSV FiO2 (%):  [36 %-40 %] 40 % Set Rate:  [35 bmp] 35 bmp Vt Set:  [400 mL] 400 mL PEEP:  [5 cmH20] 5 cmH20 Pressure Support:  [5 cmH20] 5 cmH20 Plateau Pressure:  [20 cmH20] 20 cmH20  INTAKE / OUTPUT: I/O last 3 completed shifts: In: 2766.6 [I.V.:1365.7; NG/GT:1100.8; IV Piggyback:300] Out: 9830 [Urine:9830]  PHYSICAL EXAMINATION: Gen:      No acute distress HEENT:  EOMI, sclera anicteric Neck:      No masses; no thyromegaly Lungs:    Clear to auscultation bilaterally; normal respiratory effort CV:         Regular rate and rhythm; +systolic murmur Abd:      + bowel sounds; soft, non-tender; no palpable masses, no distension Ext:    trace edema,  adequate peripheral perfusion Skin:      Warm and dry; no rash Neuro:   Moving all extremities, following simple commands and answering questions  appropriately  LABS:  BMET Recent Labs  Lab 02/04/18 0350 02/05/18 0449 02/06/18 0426  NA 141 139 140  K 3.7 3.7 3.9  CL 99* 100* 99*  CO2 _1 BUN 20 22* 20  CREATININE 0.97 0.91 0.90  GLUCOSE 189* 258* 208*    Electrolytes Recent Labs  Lab 02/02/18 0414 02/04/18 0350 02/05/18 0449 02/06/18 0426  CALCIUM 7.4* 8.1* 8.4* 9.1  MG 1.9 1.9  --  1.9  PHOS  --  4.2  --  4.3    CBC Recent Labs  Lab 02/04/18  0350 02/05/18 0449 02/06/18 0426  WBC 7.6 8.1 8.8  HGB 7.4* 7.7* 9.3*  HCT 23.4* 24.7* 30.2*  PLT 189 207 288    Coag's No results for input(s): APTT, INR in the last 168 hours.  Sepsis Markers Recent Labs  Lab 02/02/18 1059  PROCALCITON <0.10    ABG Recent Labs  Lab 01/31/18 0800 02/01/18 0830 02/02/18 0410  PHART 7.437 7.350 7.395  PCO2ART 39.6 55.2* 58.1*  PO2ART 82.0* 81.0* 81.5*    Liver Enzymes No results for input(s): AST, ALT, ALKPHOS, BILITOT, ALBUMIN in the  last 168 hours.  Cardiac Enzymes No results for input(s): TROPONINI, PROBNP in the last 168 hours.  Glucose Recent Labs  Lab 02/05/18 1214 02/05/18 1514 02/05/18 1630 02/05/18 2030 02/06/18 0013 02/06/18 0429  GLUCAP 205* 158* 198* 191* 227* 201*    Imaging No results found. STUDIES:  Strep Pneumo - negative Flu panel - negative RSVP - negative  CULTURES: 01/27/2018 Blood Culture: NG final 01/28/2018 Tracheal Aspirate: Rare G- cocci, few candida albicans 01/31/2018: BAL 10K yeast  ANTIBIOTICS: Azithromycin 3/30 >> 4/3 Vancomycin 3/30 >> 4/1 Zosyn 3/30 >> 4/6  LINES/TUBES: RIJ 3/30 >> L Femoral A-line 3/30 >> 4/8  ASSESSMENT / PLAN:  PULMONARY Acute resp failure 2/2 ARDS likely 2/2 aspiration event.  Extubated 4/8.   P: Wean O2 as able Continue duonebs, mucomyst, racepi  Continue diuresis  CARDIOVASCULAR #Baseline - Known A Fib patient . ? on amio, lipitor, tricor lopresso, omega 3, coumadin at home.  #Admit - Circulatory shock at admit with fever (RVP and urine strep engative). Initially on levo but weaned off.  # Aortic and mitral valve disease - cardiology consulted, no work up this hospitalization recommended # Volume overload - now negative for hospitalization  P: Continue heparin drip; re-start home warfarin when/if passes SLP Continue Lasix at 40 IV bid Monitor UOP and renal function  RENAL #baseline:Normal renal function #currentAt admit - normal renal function  P: Follow urine output and creatinine   GASTROINTESTINAL Constipation:  Resolved  P: contnue NPO; SLP evaluation pending following extubation D/C'd TFs PPI milk of magnesia prn  HEMATOLOGIC A:  #RBC:mild anemia of critical illness (admit hgb 3.5 spurious) - stable  #Plateletnormal #Leukocytosis resolved  P: Follow CBC  INFECTIOUS # Likely sepstic shock based on fever and presentation. Source probably lung. PCT now < 0.1 P: Finished Azithro  4/3 and Zosyn 4/6  ENDOCRINE DM on glybrudie, insulin, metformin at home AT risk hypegluycemia CBGs in the 200s  P: Increase Lantus 28 > 30, SSI  NEUROLOGIC #Baseline :Psych hx + - ? on trazadone, seroquel, vicodine, tegretol , cogentin, and tylonl # 3 at home #Current:admit for ARDS - off sedation and extubated 4/8  P: Continue seroquel. Restart home meds when/if passes SLP evaluation.  FAMILY  - Updates: No family at bedside 4/9 am  [facility] Pager [phone] If no answer or after 3pm call: [phone]

## 2018-02-06 NOTE — Progress Notes (Signed)
ANTICOAGULATION CONSULT NOTE - Follow Up Consult  Pharmacy Consult for heparin Indication: atrial fibrillation  Allergies  Allergen Reactions  . Sulfa Antibiotics Rash    Patient Measurements: Height: 5\' 2"  (157.5 cm) Weight: 160 lb 4.4 oz (72.7 kg) IBW/kg (Calculated) : 50.1 Heparin Dosing Weight: 68 kg  Vital Signs: Temp: 98.6 F (37 C) (04/09 1600) Temp Source: Core (04/09 0600) BP: 154/63 (04/09 1600) Pulse Rate: 85 (04/09 1600)  Labs: Recent Labs    02/04/18 0350 02/05/18 0449 02/06/18 0426 02/06/18 0500 02/06/18 0652 02/06/18 1600  HGB 7.4* 7.7* 9.3*  --   --   --   HCT 23.4* 24.7* 30.2*  --   --   --   PLT 189 207 288  --   --   --   HEPARINUNFRC 0.52 0.53  --  1.09* 1.26* 0.70  CREATININE 0.97 0.91 0.90  --   --   --     Estimated Creatinine Clearance: 66.7 mL/min (by C-G formula based on SCr of 0.9 mg/dL).  Medications: Heparin 1850 units/hr  Assessment: [age] yoF with a history of AFib on warfarin PTA found unresponsive at home. Pharmacy consulted to start IV heparin while holding oral AC.  Heparin level now therapeutic at 0.7 after rate decrease but at top of the range. CBC stable, no S/Sx bleeding or issues with the infusion per discussion with RN.  Goal of Therapy:  Heparin level 0.3-0.7 units/ml Monitor platelets by anticoagulation protocol: Yes   Plan:  -Reduce heparin slightly to 1800 units/hr to ensure stays in range -Confirmatory heparin level with AM labs -Check daily heparin level and CBC -Monitor S/Sx bleeding  Babs BertinHaley Natalee Tomkiewicz, PharmD, BCPS Clinical Pharmacist 02/06/2018 5:56 PM

## 2018-02-06 NOTE — Progress Notes (Signed)
ANTICOAGULATION CONSULT NOTE - Follow Up Consult  Pharmacy Consult for heparin Indication: atrial fibrillation  Allergies  Allergen Reactions  . Sulfa Antibiotics Rash    Patient Measurements: Height: 5\' 2"  (157.5 cm) Weight: 160 lb 4.4 oz (72.7 kg) IBW/kg (Calculated) : 50.1 Heparin Dosing Weight: 68 kg  Vital Signs: Temp: 98.4 F (36.9 C) (04/09 0826) Temp Source: Core (04/09 0600) BP: 159/64 (04/09 0800) Pulse Rate: 88 (04/09 0826)  Labs: Recent Labs    02/04/18 0350 02/05/18 0449 02/06/18 0426 02/06/18 0500 02/06/18 0652  HGB 7.4* 7.7* 9.3*  --   --   HCT 23.4* 24.7* 30.2*  --   --   PLT 189 207 288  --   --   HEPARINUNFRC 0.52 0.53  --  1.09* 1.26*  CREATININE 0.97 0.91 0.90  --   --     Estimated Creatinine Clearance: 66.7 mL/min (by C-G formula based on SCr of 0.9 mg/dL).  Medications: Heparin 2100 units/hr  Assessment: [age] yoF with a history of AFib on warfarin PTA found unresponsive at home. Pharmacy consulted to start IV heparin while holding oral AC.  Heparin level this morning elevated, per RN the level was drawn appropriately by overnight RN. CBC stable, no S/Sx bleeding noted.  Goal of Therapy:  Heparin level 0.3-0.7 units/ml Monitor platelets by anticoagulation protocol: Yes   Plan:  -Hold heparin x1 hr then reduce to 1850 units/hr at 1000 -Recheck heparin level at 1600 -Check daily heparin level and CBC -Monitor S/Sx bleeding  Thank you for allowing pharmacy to be a part of this patient's care.  Fredonia HighlandMichael Bitonti, PharmD, BCPS PGY-2 Cardiology Pharmacy Resident Pager: [phone] 02/06/2018

## 2018-02-06 NOTE — Care Management Note (Addendum)
Case Management Note  Patient Details  Name: Natasha Martin S [place] MRN: [id number] Date of Birth: 05/04/1961  Subjective/Objective:    Pt admitted with SOB                Action/Plan:  PTA from home.  Pt remains on ventilator.     Expected Discharge Date:                  Expected Discharge Plan:  Skilled Nursing Facility  In-House Referral:  Clinical Social Work  Discharge planning Services  CM Consult  Post Acute Care Choice:    Choice offered to:     DME Arranged:    DME Agency:     HH Arranged:    HH Agency:     Status of Service:     If discussed at [employer] Meetings, dates discussed:    Additional Comments: 02/06/2018  Pt now extubated from ETT tube and on [place] , IV heparin, IV decadron and IV keppra.   CM discussed previous LTACH referral with attending on today 4/10  (original referral ordered by Bedside nurse, referral discussed with attending at the time and pt was not considered appropriate due to ETT ventilation)  - [facility] either.  PT eval pending   02/05/18 Pt extubated this am however requiring Aerosol mask with 10 HFNC.  Pt remains unstable for PT eval - CM will continue to follow for discharge needs Cherylann ParrClaxton, Madine Sarr S, RN 02/06/2018, 11:16 AM

## 2018-02-07 ENCOUNTER — Inpatient Hospital Stay (HOSPITAL_COMMUNITY): Payer: Medicare Other

## 2018-02-07 LAB — BASIC METABOLIC PANEL
Anion gap: 14 (ref 5–15)
BUN: 27 mg/dL — ABNORMAL HIGH (ref 6–20)
CHLORIDE: 100 mmol/L — AB (ref 101–111)
CO2: 29 mmol/L (ref 22–32)
CREATININE: 0.99 mg/dL (ref 0.44–1.00)
Calcium: 9.1 mg/dL (ref 8.9–10.3)
GFR calc non Af Amer: >60 mL/min (ref >60)
Glucose, Bld: 172 mg/dL — ABNORMAL HIGH (ref 65–99)
POTASSIUM: 3.7 mmol/L (ref 3.5–5.1)
Sodium: 143 mmol/L (ref 135–145)

## 2018-02-07 LAB — GLUCOSE, CAPILLARY
GLUCOSE-CAPILLARY: 167 mg/dL — AB (ref 65–99)
GLUCOSE-CAPILLARY: 188 mg/dL — AB (ref 65–99)
GLUCOSE-CAPILLARY: 354 mg/dL — AB (ref 65–99)
Glucose-Capillary: 165 mg/dL — ABNORMAL HIGH (ref 65–99)
Glucose-Capillary: 200 mg/dL — ABNORMAL HIGH (ref 65–99)
Glucose-Capillary: 361 mg/dL — ABNORMAL HIGH (ref 65–99)

## 2018-02-07 LAB — CBC
HEMATOCRIT: 31.3 % — AB (ref 36.0–46.0)
Hemoglobin: 9.8 g/dL — ABNORMAL LOW (ref 12.0–15.0)
MCH: 30.6 pg (ref 26.0–34.0)
MCHC: 31.3 g/dL (ref 30.0–36.0)
MCV: 97.8 fL (ref 78.0–100.0)
PLATELETS: 409 10*3/uL — AB (ref 150–400)
RBC: 3.2 MIL/uL — AB (ref 3.87–5.11)
RDW: 15.6 % — ABNORMAL HIGH (ref 11.5–15.5)
WBC: 12.5 10*3/uL — ABNORMAL HIGH (ref 4.0–10.5)

## 2018-02-07 LAB — PROTIME-INR
INR: 1.22
Prothrombin Time: 15.3 seconds — ABNORMAL HIGH (ref 11.4–15.2)

## 2018-02-07 LAB — HEPARIN LEVEL (UNFRACTIONATED)
Heparin Unfractionated: 0.89 IU/mL — ABNORMAL HIGH (ref 0.30–0.70)
Heparin Unfractionated: 0.75 IU/mL — ABNORMAL HIGH (ref 0.30–0.70)

## 2018-02-07 LAB — TSH: TSH: 0.822 u[IU]/mL (ref 0.350–4.500)

## 2018-02-07 LAB — VITAMIN B12: Vitamin B-12: 731 pg/mL (ref 180–914)

## 2018-02-07 MED ORDER — RESOURCE THICKENUP CLEAR PO POWD
ORAL | Status: DC | PRN
  Filled 2018-02-07: qty 125

## 2018-02-07 MED ORDER — WARFARIN - PHARMACIST DOSING INPATIENT
Freq: Every day | Status: DC
  Administered 2018-02-08: 1

## 2018-02-07 MED ORDER — WARFARIN SODIUM 5 MG PO TABS
5.0000 mg | ORAL_TABLET | Freq: Once | ORAL | Status: AC
  Administered 2018-02-07: 5 mg via ORAL
  Filled 2018-02-07: qty 1

## 2018-02-07 MED ORDER — FUROSEMIDE 10 MG/ML IJ SOLN
20.0000 mg | Freq: Every day | INTRAMUSCULAR | Status: DC
  Administered 2018-02-07: 20 mg via INTRAVENOUS
  Filled 2018-02-07: qty 2

## 2018-02-07 NOTE — Progress Notes (Addendum)
PROGRESS NOTE    Natasha Martin  NIO:[id number] DOB: November 22, 1960 DOA: 01/27/2018 PCP: Neale Burly, MD   Brief Narrative: this 57 year old with diabetes felt mental delay and schizophrenia who also has a reported history of seizures in the chart. For the past 2-3 days she has been having a productive cough and she was found in the bathroom this morning unresponsive. When EMS arrived her saturations were in the 20s. She was taking to [facility] where she was intubated and chest x-ray has shown diffuse bilateral infiltrates.Admitted3/30/2019  Significant Events: 01/27/2018: Transferred from [facility]; intubated; CXR with bilateral infiltrates 01/28/2018: ARDS protocol; Nimbex started 01/30/2018: Nimbex stopped; required propofol > drop in BP and levo started 01/31/2018: Bronchoscopy (cultures with yeast only) 02/05/2018: Extubated, A-line out     Assessment & Plan:   Active Problems:   Acute respiratory failure (HCC)   ARDS (adult respiratory distress syndrome) (HCC)  Acute resp failure 2/2 ARDS likely 2/2 aspiration event.  Extubated 4-08. Now on 3 L oxygen.  Speech swallow evaluation.  Received 7 days of antibiotics.  Follow WBC.  On IV lasix.  On decadron, last dose today.   A fib;  On amiodarone, lopressor.  On heparin Gtt.  Start coumadin, now that she pass swallow evaluation.   Septic shock; Off levophed.  Treated with IV antibiotics.   Aortic and mitral valve diseases;  Cardiology was consulted, no work up during this admission.   Anemia;  Hb at 3, on admission, probably not accurate.  Hb now at 9. Follow trend.   DM On lantus and SSI.   Mild mental delay. Seizure.  Check B 12, TSH.  IV keppra.        DVT prophylaxis: heparin.  Code Status: full code.  Family Communication: no family at bedside.  Disposition Plan: PT eval.    Consultants:   CCM   Procedures:  Left ventricle: The cavity size was normal. Wall thickness was  increased in a pattern of mild LVH. Systolic function was normal.   The estimated ejection fraction was in the range of 55% to 60%.   Wall motion was normal; there were no regional wall motion   abnormalities. Features are consistent with a pseudonormal left   ventricular filling pattern, with concomitant abnormal relaxation   and increased filling pressure (grade 2 diastolic dysfunction). - Aortic valve: The aortic valve is markedly abnormal. Appears   trileaflet but I cannot see the left coronary cusp well. The   non-coronary cusp is markedly thickened and calcified. The right   coronary cusp may be fused. There is significant turbulence over   the valve with mild stenosis. Would strongly consider TEE. There   was trivial regurgitation. Valve area (VTI): 1.66 cm^2. Valve   area (Vmax): 1.66 cm^2. Valve area (Vmean): 1.72 cm^2. - Mitral valve: Calcified annulus. - Left atrium: The atrium was moderately dilated. - Atrial septum: There was increased thickness of the septum,   consistent with lipomatous hypertrophy. - Impressions: Abnormal aortic valve as described above. Also with   signifcant MAC for her age. Would strongly consider TEE.   Antimicrobials: Azithromycin 3/30 >> 4/3 Vancomycin 3/30 >> 4/1 Zosyn 3/30 >> 4/6    Subjective:  Objective: Vitals:   02/07/18 0045 02/07/18 0330 02/07/18 0403 02/07/18 0700  BP: (!) 145/61 (!) 145/59  130/64  Pulse:      Resp: 19   (!) 24  Temp: 98 F (36.7 C) 97.8 F (36.6 C)  98.8 F (37.1  C)  TempSrc:  Oral  Axillary  SpO2: 94% 96%  97%  Weight:   71.9 kg (158 lb 8.2 oz)   Height:        Intake/Output Summary (Last 24 hours) at 02/07/2018 0844 Last data filed at 02/07/2018 0404 Gross per 24 hour  Intake 569 ml  Output 2150 ml  Net -1581 ml   Filed Weights   02/05/18 0437 02/06/18 0500 02/07/18 0403  Weight: 77.6 kg (171 lb 1.2 oz) 72.7 kg (160 lb 4.4 oz) 71.9 kg (158 lb 8.2 oz)    Examination:  General exam: Appears  calm and comfortable  Respiratory system: Bilateral crackles. Marland Kitchen Respiratory effort normal. Cardiovascular system: S1 & S2 heard, RR. No JVD, murmurs, rubs, gallops or clicks. No pedal edema. Gastrointestinal system: Abdomen is nondistended, soft and nontender. No organomegaly or masses felt. Normal bowel sounds heard. Central nervous system: able to answer some questions, slow response time Extremities: Symmetric 5 x 5 power. Skin: No rashes, lesions or ulcers   Data Reviewed: I have personally reviewed following labs and imaging studies  CBC: Recent Labs  Lab 02/01/18 0350  02/03/18 0440 02/04/18 0350 02/05/18 0449 02/06/18 0426 02/07/18 0206  WBC 7.3   < > 8.2 7.6 8.1 8.8 12.5*  NEUTROABS 5.2  --   --   --   --   --   --   HGB 7.2*   < > 7.1* 7.4* 7.7* 9.3* 9.8*  HCT 22.0*   < > 22.6* 23.4* 24.7* 30.2* 31.3*  MCV 97.3   < > 100.0 98.3 99.2 98.7 97.8  PLT 159   < > 173 189 207 288 409*   < > = values in this interval not displayed.   Basic Metabolic Panel: Recent Labs  Lab 02/01/18 1034 02/02/18 0414 02/04/18 0350 02/05/18 0449 02/06/18 0426 02/07/18 0206  NA  --  142 141 139 140 143  K  --  4.0 3.7 3.7 3.9 3.7  CL  --  102 99* 100* 99* 100*  CO2  --  _0 GLUCOSE  --  190* 189* 258* 208* 172*  BUN  --  10 20 22* 20 27*  CREATININE  --  0.82 0.97 0.91 0.90 0.99  CALCIUM  --  7.4* 8.1* 8.4* 9.1 9.1  MG 1.6* 1.9 1.9  --  1.9  --   PHOS  --   --  4.2  --  4.3  --    GFR: Estimated Creatinine Clearance: 60.3 mL/min (by C-G formula based on SCr of 0.99 mg/dL). Liver Function Tests: No results for input(s): AST, ALT, ALKPHOS, BILITOT, PROT, ALBUMIN in the last 168 hours. No results for input(s): LIPASE, AMYLASE in the last 168 hours. No results for input(s): AMMONIA in the last 168 hours. Coagulation Profile: No results for input(s): INR, PROTIME in the last 168 hours. Cardiac Enzymes: No results for input(s): CKTOTAL, CKMB, CKMBINDEX, TROPONINI in the  last 168 hours. BNP (last 3 results) No results for input(s): PROBNP in the last 8760 hours. HbA1C: No results for input(s): HGBA1C in the last 72 hours. CBG: Recent Labs  Lab 02/06/18 2033 02/06/18 2255 02/07/18 0020 02/07/18 0336 02/07/18 0808  GLUCAP 235* 235* 200* 165* 188*   Lipid Profile: No results for input(s): CHOL, HDL, LDLCALC, TRIG, CHOLHDL, LDLDIRECT in the last 72 hours. Thyroid Function Tests: No results for input(s): TSH, T4TOTAL, FREET4, T3FREE, THYROIDAB in the last 72 hours. Anemia Panel: No results for input(s):  VITAMINB12, FOLATE, FERRITIN, TIBC, IRON, RETICCTPCT in the last 72 hours. Sepsis Labs: Recent Labs  Lab 02/02/18 1059  PROCALCITON <0.10    Recent Results (from the past 240 hour(s))  Culture, respiratory (NON-Expectorated)     Status: None   Collection Time: 01/28/18  4:15 PM  Result Value Ref Range Status   Specimen Description TRACHEAL ASPIRATE  Final   Special Requests NONE  Final   Gram Stain   Final    ABUNDANT WBC PRESENT,BOTH PMN AND MONONUCLEAR RARE SQUAMOUS EPITHELIAL CELLS PRESENT RARE GRAM NEGATIVE COCCI IN PAIRS Performed at [facility], [place] [address], [place], [place] [address]    Culture FEW CANDIDA ALBICANS  Final   Report Status 01/31/2018 FINAL  Final  Culture, bal-quantitative     Status: Abnormal   Collection Time: 01/31/18  1:52 PM  Result Value Ref Range Status   Specimen Description BRONCHIAL ALVEOLAR LAVAGE  Final   Special Requests Normal  Final   Gram Stain   Final    ABUNDANT WBC PRESENT, PREDOMINANTLY PMN NO ORGANISMS SEEN Performed at [facility], [place] [address], [place], [place] [address]    Culture 10,000 COLONIES/mL CANDIDA ALBICANS (A)  Final   Report Status 02/03/2018 FINAL  Final         Radiology Studies: No results found.      Scheduled Meds: . chlorhexidine  15 mL Mouth Rinse BID  . dexamethasone  4 mg Intravenous Q6H  . insulin aspart  0-20 Units Subcutaneous Q4H    . insulin glargine  30 Units Subcutaneous Daily  . mouth rinse  15 mL Mouth Rinse q12n4p  . mupirocin cream   Topical Daily  . pantoprazole (PROTONIX) IV  40 mg Intravenous Q24H  . potassium chloride  40 mEq Oral Daily  . sennosides  5 mL Oral Daily   Continuous Infusions: . sodium chloride 250 mL (02/06/18 0952)  . heparin 1,600 Units/hr (02/07/18 0330)  . levETIRAcetam Stopped (02/07/18 0317)     LOS: 11 days    Time spent: 35 minutes.     Elmarie Shiley, MD [facility] Pager [phone]  If 7PM-7AM, please contact night-coverage [email or web address] Password [facility] 02/07/2018, 8:44 AM

## 2018-02-07 NOTE — Progress Notes (Signed)
ANTICOAGULATION CONSULT NOTE - Follow Up Consult  Pharmacy Consult for heparin Indication: atrial fibrillation  Allergies  Allergen Reactions  . Sulfa Antibiotics Rash    Patient Measurements: Height: 5\' 2"  (157.5 cm) Weight: 158 lb 8.2 oz (71.9 kg) IBW/kg (Calculated) : 50.1 Heparin Dosing Weight: 68 kg  Vital Signs: Temp: 98.8 F (37.1 C) (04/10 0700) Temp Source: Axillary (04/10 0700) BP: 130/64 (04/10 0700)  Labs: Recent Labs    02/05/18 0449 02/06/18 0426  02/06/18 1600 02/07/18 0206 02/07/18 0902  HGB 7.7* 9.3*  --   --  9.8*  --   HCT 24.7* 30.2*  --   --  31.3*  --   PLT 207 288  --   --  409*  --   HEPARINUNFRC 0.53  --    < > 0.70 0.89* 0.75*  CREATININE 0.91 0.90  --   --  0.99  --    < > = values in this interval not displayed.    Estimated Creatinine Clearance: 60.3 mL/min (by C-G formula based on SCr of 0.99 mg/dL).  Assessment: [age] yoF with a history of AFib on warfarin PTA found unresponsive at home. Pharmacy consulted to start IV heparin while holding oral AC (she failed the swallow evaluation) -heparin level = 0.75 on 1600 units/hr   Goal of Therapy:  Heparin level 0.3-0.7 units/ml Monitor platelets by anticoagulation protocol: Yes   Plan:  -Reduce heparin to 1700 units/h -Check daily heparin level and CBC  Harland GermanAndrew Brentton Wardlow, PharmD Clinical Pharmacist Clinical phone from 8:30-4:00 is [phone] After 4pm, please call Main Rx [phone]) for assistance. 02/07/2018 10:47 AM

## 2018-02-07 NOTE — Progress Notes (Signed)
ANTICOAGULATION CONSULT NOTE - Follow Up Consult  Pharmacy Consult for heparin Indication: atrial fibrillation  Labs: Recent Labs    02/05/18 0449 02/06/18 0426  02/06/18 0652 02/06/18 1600 02/07/18 0206  HGB 7.7* 9.3*  --   --   --  9.8*  HCT 24.7* 30.2*  --   --   --  31.3*  PLT 207 288  --   --   --  409*  HEPARINUNFRC 0.53  --    < > 1.26* 0.70 0.89*  CREATININE 0.91 0.90  --   --   --  0.99   < > = values in this interval not displayed.    Assessment: 56yo female supratherapeutic on heparin despite rate decrease; no bleeding per RN; pt no longer has CVC and labs now being drawn by phlebotomy.  Goal of Therapy:  Heparin level 0.3-0.7 units/ml   Plan:  Will decrease heparin gtt by 2-3 units/kg/hr to 1600 units/hr and check level in 6 hours.    Vernard GamblesVeronda Evalene Martin, PharmD, BCPS  02/07/2018,3:28 AM

## 2018-02-07 NOTE — Progress Notes (Signed)
Nutrition Follow-up  DOCUMENTATION CODES:   Obesity unspecified  INTERVENTION:   -Magic Cup TID with meals  NUTRITION DIAGNOSIS:   Inadequate oral intake related to inability to eat as evidenced by NPO status.  Progressing; just advanced to dysphagia 1 diet with honey thick liquids  GOAL:   Patient will meet greater than or equal to 90% of their needs  Progressing  MONITOR:   PO intake, Supplement acceptance, Diet advancement, Labs, Weight trends, Skin, I & O's  REASON FOR ASSESSMENT:   Ventilator, Consult Enteral/tube feeding initiation and management  ASSESSMENT:   57 yo female with PMH of DM, HLD, schizophrenia, mental delay, anxiety, HTN, dysphagia, and seizures who was admitted on 3/30 with ARDS after being found unresponsive; 2-3 day hx of productive cough; [facility]  4/8- extubated 4/9- s/p BSE- recommend continued NPO  Case discussed with RN, who reports pt went down to MBSS this AM and was just advanced to a dysphagia 1 diet with honey thick liquids.   Spoke with pt and sister at bedside. Pt not very talkative, due to complaints of sore throat. Pt was able to communicate via head nods. She is very eager to eat; per sister, pt just consumed a cup of applesauce prior to visit. Pt sister denies poor appetite and weight loss PTA; state pt "ate too much" PTA.   Discussed importance of good meal intake to promote healing. Educated pt and sister on allowed foods on current diet and discussed potential for diet advancement as pt progresses.   Labs reviewed: CBGS: 165-235 (inpatient orders for glycemic control are 0-20 units insulin aspart every 4 hors and 30 units insulin glargine daly).  Diet Order:  DIET - DYS 1 Room service appropriate? Yes; Fluid consistency: Honey Thick  EDUCATION NEEDS:   Education needs have been addressed  Skin:  Skin Assessment: Skin Integrity Issues: Skin Integrity Issues:: Other (Comment) Other: dry, scabbed areas to  medial coccyx  Last BM:  02/05/18  Height:   Ht Readings from Last 1 Encounters:  01/27/18 5\' 2"  (1.575 m)    Weight:   Wt Readings from Last 1 Encounters:  02/07/18 158 lb 8.2 oz (71.9 kg)    Ideal Body Weight:  50 kg  BMI:  Body mass index is 28.99 kg/m.  Estimated Nutritional Needs:   Kcal:  1400-1600  Protein:  80-90 gm  Fluid:  1.6 L    Cliffton Spradley A. Mayford KnifeWilliams, RD, LDN, CDE Pager: [phone] After hours Pager: [phone]

## 2018-02-07 NOTE — Progress Notes (Signed)
Modified Barium Swallow Progress Note  Patient Details  Name: Natasha Martin MRN: [id number] Date of Birth: [birth year]  Today's Date: 02/07/2018  Modified Barium Swallow completed.  Full report located under Chart Review in the Imaging Section.  Brief recommendations include the following:  Clinical Impression  Oral phase dysphagia is moderate and marked by weak labial seal, decreased control/cohesion leading to anterior spill and premature spill to pyriform sinuses. Timing and coordination of laryngeal elevation and epiglottic closure decreased resulted in penetration before the swallow and aspiration (silent) during with thin, aspiration of nectar using straw. Pt attempted but was unable to produce throat clear or cough on command to remove penetrates/aspirated. Timing appeared improved with honey thick trials. No significant pharyngeal residue. Residue just under the UES at end of study suspected due to muscle fatigue and reduced pressure. Esophageal scan although not diagnosed by MBS revealed mild-moderate residue. Recommend pt initiate puree (Dys 1), honey thick liquids, crush pills and full supervision/assist.     Swallow Evaluation Recommendations       SLP Diet Recommendations: Honey thick liquids;Dysphagia 1 (Puree) solids   Liquid Administration via: Cup;No straw   Medication Administration: Crushed with puree   Supervision: Staff to assist with self feeding;Full supervision/cueing for compensatory strategies   Compensations: Slow rate;Small sips/bites   Postural Changes: Seated upright at 90 degrees   Oral Care Recommendations: Oral care BID        [name], Natasha Martin 02/07/2018,1:47 PM  Breck CoonsLisa Martin WeottLitaker M.Ed [employer] Pager [phone]

## 2018-02-07 NOTE — Progress Notes (Addendum)
ANTICOAGULATION CONSULT NOTE - Follow Up Consult  Pharmacy Consult for heparin > warfarin Indication: atrial fibrillation  Allergies  Allergen Reactions  . Sulfa Antibiotics Rash    Patient Measurements: Height: 5\' 2"  (157.5 cm) Weight: 158 lb 8.2 oz (71.9 kg) IBW/kg (Calculated) : 50.1 Heparin Dosing Weight: 68 kg  Vital Signs: Temp: 97.8 F (36.6 C) (04/10 1500) Temp Source: Oral (04/10 1500) BP: 139/71 (04/10 1500)  Labs: Recent Labs    02/05/18 0449 02/06/18 0426  02/06/18 1600 02/07/18 0206 02/07/18 0902 02/07/18 1538  HGB 7.7* 9.3*  --   --  9.8*  --   --   HCT 24.7* 30.2*  --   --  31.3*  --   --   PLT 207 288  --   --  409*  --   --   LABPROT  --   --   --   --   --   --  15.3*  INR  --   --   --   --   --   --  1.22  HEPARINUNFRC 0.53  --    < > 0.70 0.89* 0.75*  --   CREATININE 0.91 0.90  --   --  0.99  --   --    < > = values in this interval not displayed.    Estimated Creatinine Clearance: 60.3 mL/min (by C-G formula based on SCr of 0.99 mg/dL).  Assessment: [age] yoF with a history of AFib on warfarin PTA found unresponsive at home. Pharmacy consulted to start IV heparin while holding oral AC (she failed the swallow evaluation) -heparin level = 0.75 on 1600 units/hr   Goal of Therapy:  Heparin level 0.3-0.7 units/ml Monitor platelets by anticoagulation protocol: Yes   Plan:  -Reduce heparin to 1500 units/h -Check daily heparin level and CBC  Harland GermanAndrew Meyer, PharmD Clinical Pharmacist Clinical phone from 8:30-4:00 is [phone] After 4pm, please call Main Rx [phone]) for assistance. 02/07/2018 4:38 PM  ADDENDUM: Pharmcy consulted to add warfarin. Home warfarin dose is 2mg  daily except 3mg  on Sat and Sun per med rec - last dose 3/29 PTA. INR low at 1.22 today. CBC stable. No bleed documented. Noted on amiodarone PTA - not currently ordered inpatient.  Plan: Continue heparin at 1500 units/hr Warfarin 5mg  PO x 1 Daily INR/heparin level/CBC Monitor  for s/sx bleeding  Babs BertinHaley Sheelah Ritacco, PharmD, BCPS Clinical Pharmacist 02/07/2018 4:42 PM

## 2018-02-07 NOTE — Evaluation (Signed)
Physical Therapy Evaluation Patient Details Name: Natasha Martin MRN: [id number] DOB: [birth year] Today's Date: 02/07/2018   History of Present Illness  Pt is a 57 y.o. female admitted 01/27/18 after being found unresponsive; EMS found saturations in 20s. Pt was intubated upon arrival. CT shows no acute intracranial abnormality; mild chronic small-vessel ischemic changes. CXR showed diffuse bilateral infiltrates. Worked up for ARDS likely secondary to aspiration event. Extubated 4/8. PMH includes mild mental slowing, seizures, schizophrenia, HTN, DM, anxiety.      Clinical Impression  Pt presents with an overall decrease in functional mobility secondary to above. Pt minimally interacting this session and poor historian. Sister present and states pt mostly indep with amb and ADLs; lives full-time with sister. Today, pt required max encouragement to vocalize answers and participate with PT. Required maxA for bed mobility. Sat EOB >15 min with min guard to maxA to maintain seated balance; significant R lateral lean, as well as RUE weakness. Difficult to assess new onset cognitive deficits versus pt's baseline; per sister, pt normally very chatty and interactive, although "slow." Pt would benefit from continued acute PT services to maximize functional mobility and independence prior to d/c with SNF-level therapies.     Follow Up Recommendations SNF;Supervision/Assistance - 24 hour    Equipment Recommendations  (TBD next venue)    Recommendations for Other Services OT consult     Precautions / Restrictions Precautions Precautions: Fall Restrictions Weight Bearing Restrictions: No      Mobility  Bed Mobility Overal bed mobility: Needs Assistance Bed Mobility: Supine to Sit;Sit to Supine     Supine to sit: Max assist Sit to supine: Max assist   General bed mobility comments: Increased time and effort, with pt not consistently following cues/commands for sequencing. MaxA to assist trunk  elevation and scoot bilat hips to EOB. MaxA to return to supine. MaxA+2 (with RN) to scoot up in bed; pt able to flex knees and help <25%  Transfers                 General transfer comment: Deferred this session  Ambulation/Gait                Stairs            Wheelchair Mobility    Modified Rankin (Stroke Patients Only)       Balance Overall balance assessment: Needs assistance Sitting-balance support: No upper extremity supported;Bilateral upper extremity supported Sitting balance-Leahy Scale: Poor Sitting balance - Comments: Pt with signficant right lateral lean while sitting, with decreased ability to correct fully despite max multimodal cues. Sat EOB >15 min with intermittent min guard to maxA to maintain seated balance Postural control: Right lateral lean                                   Pertinent Vitals/Pain Pain Assessment: Faces Faces Pain Scale: Hurts little more Pain Location: Throat Pain Descriptors / Indicators: Sore Pain Intervention(s): Monitored during session    Home Living Family/patient expects to be discharged to:: Skilled nursing facility Living Arrangements: Other relatives               Additional Comments: Lives with sister and sister's grandkids    Prior Function           Comments: Pt poor historian. Sister reports pt indep with all mobility and most ADLs. Sister cooks for pt     Higher education careers adviser  Extremity/Trunk Assessment   Upper Extremity Assessment Upper Extremity Assessment: RUE deficits/detail;LUE deficits/detail;Difficult to assess due to impaired cognition RUE Deficits / Details: RUE <3/5 throughout, with shoulder active flex/abd<90'; pt required max encouragement to use during session LUE Deficits / Details: Grossly 3/5    Lower Extremity Assessment Lower Extremity Assessment: Generalized weakness;Difficult to assess due to impaired cognition    Cervical / Trunk  Assessment Cervical / Trunk Assessment: Kyphotic  Communication   Communication: Expressive difficulties(Hoarse, soft (post-extub))  Cognition Arousal/Alertness: Lethargic;Suspect due to medications Behavior During Therapy: Flat affect Overall Cognitive Status: Impaired/Different from baseline Area of Impairment: Attention;Following commands;Safety/judgement;Awareness;Problem solving                   Current Attention Level: Sustained   Following Commands: Follows one step commands inconsistently;Follows one step commands with increased time Safety/Judgement: Decreased awareness of deficits Awareness: Intellectual Problem Solving: Slow processing;Decreased initiation;Difficulty sequencing;Requires verbal cues;Requires tactile cues General Comments: Sister reports pt's baseline as "slow," but pt typically appropriately conversant and chatty. Today, pt appears lethargic. Required max encouragement to vocalize responses (mainly yes/no) due to throat soreness. Not consistently following commands or participating with mobility      General Comments General comments (skin integrity, edema, etc.): Sister (retired Charity fundraiserN) present throughout session; very involved and trying to give instruction to pt, but responded well to being asked to let PT lead session    Exercises     Assessment/Plan    PT Assessment Patient needs continued PT services  PT Problem List Decreased strength;Decreased activity tolerance;Decreased balance;Decreased mobility;Decreased cognition;Decreased coordination;Decreased knowledge of use of DME;Decreased safety awareness;Cardiopulmonary status limiting activity       PT Treatment Interventions DME instruction;Gait training;Stair training;Functional mobility training;Therapeutic activities;Therapeutic exercise;Balance training;Patient/family education;Wheelchair mobility training    PT Goals (Current goals can be found in the Care Plan section)  Acute Rehab PT  Goals Patient Stated Goal: Get stronger PT Goal Formulation: With patient/family Time For Goal Achievement: 02/21/18 Potential to Achieve Goals: Good    Frequency Min 2X/week   Barriers to discharge        Co-evaluation               AM-PAC PT "6 Clicks" Daily Activity  Outcome Measure Difficulty turning over in bed (including adjusting bedclothes, sheets and blankets)?: Unable Difficulty moving from lying on back to sitting on the side of the bed? : Unable Difficulty sitting down on and standing up from a chair with arms (e.g., wheelchair, bedside commode, etc,.)?: Unable Help needed moving to and from a bed to chair (including a wheelchair)?: Total Help needed walking in hospital room?: Total Help needed climbing 3-5 steps with a railing? : Total 6 Click Score: 6    End of Session   Activity Tolerance: Patient limited by fatigue Patient left: in bed;with call bell/phone within reach;with family/visitor present Nurse Communication: Mobility status PT Visit Diagnosis: Other abnormalities of gait and mobility (R26.89);Muscle weakness (generalized) (M62.81)    Time: 1301-1340 PT Time Calculation (min) (ACUTE ONLY): 39 min   Charges:   PT Evaluation $PT Eval Moderate Complexity: 1 Mod PT Treatments $Therapeutic Activity: 23-37 mins   PT G Codes:       Ina HomesJaclyn Willella Harding, PT, DPT Acute Rehab Services  Pager: [phone]  Malachy ChamberJaclyn L Theodore Virgin 02/07/2018, 3:30 PM

## 2018-02-07 NOTE — Progress Notes (Signed)
  Speech Language Pathology Treatment: Dysphagia  Patient Details Name: Natasha Martin MRN: [id number] DOB: 1961-06-09 Today's Date: 02/07/2018 Time: [id number] SLP Time Calculation (min) (ACUTE ONLY): 15 min  Assessment / Plan / Recommendation Clinical Impression  Pt awake, responded to majority of basic questions with delays. She exhibited a hoarse vocal quality with low intensity. Vocal quality wet and swallows appeared weak and inefficient however unable to evaluate this at bedside. Full assist needed for feeding. Given cognitive impairments, prolonged intubation and clinical observations, recommend MBS which is scheduled for 10:30.    HPI HPI: Pt is a 57 year old who [facility] being found unresponsive with report of productive cough, mental delay, and schizophrenia. She has a past medical history of Anxiety, Diabetes (HCC), Dysphagia, Hyperlipidemia, Hypertension, Mild mental slowing, Poor historian, Schizophrenia (HCC), and Seizures (HCC). Surgical Hx includes Esophagogastroduodenoscopy (egd) with propofol (01/28/2014) and Savory dilation (01/28/2014). When EMS arrived her saturations were in the 20s. Pt was was intubated on arrival (01/27/18) and chest x-ray has showed diffuse bilateral infiltrates. ARDSlikely 2/2 aspirationevent. Extubated 4/8. CXR (4/9) reveals No change to BILATERAL pulmonary opacities. CT Head (3/30) revealed no evidence of acute intracranial abnormality and mild chronic small-vessel white matter ischemic changes.       SLP Plan  MBS       Recommendations  Diet recommendations: NPO                Oral Care Recommendations: Oral care QID Follow up Recommendations: (TBD) SLP Visit Diagnosis: Dysphagia, unspecified (R13.10) Plan: MBS                      [name], Lorinda Copland Willis 02/07/2018, 9:56 AM   Breck CoonsLisa Willis Lonell FaceLitaker M.Ed [employer]SLP Pager [phone]

## 2018-02-08 ENCOUNTER — Inpatient Hospital Stay (HOSPITAL_COMMUNITY): Payer: Medicare Other

## 2018-02-08 DIAGNOSIS — Z818 Family history of other mental and behavioral disorders: Secondary | ICD-10-CM

## 2018-02-08 DIAGNOSIS — J9600 Acute respiratory failure, unspecified whether with hypoxia or hypercapnia: Secondary | ICD-10-CM

## 2018-02-08 LAB — BASIC METABOLIC PANEL
Anion gap: 11 (ref 5–15)
BUN: 39 mg/dL — AB (ref 6–20)
CALCIUM: 8.8 mg/dL — AB (ref 8.9–10.3)
CHLORIDE: 112 mmol/L — AB (ref 101–111)
CO2: 26 mmol/L (ref 22–32)
CREATININE: 1.06 mg/dL — AB (ref 0.44–1.00)
GFR calc non Af Amer: 58 mL/min — ABNORMAL LOW (ref >60)
Glucose, Bld: 142 mg/dL — ABNORMAL HIGH (ref 65–99)
Potassium: 4.1 mmol/L (ref 3.5–5.1)
SODIUM: 149 mmol/L — AB (ref 135–145)

## 2018-02-08 LAB — CBC
HEMATOCRIT: 33.2 % — AB (ref 36.0–46.0)
HEMOGLOBIN: 10.4 g/dL — AB (ref 12.0–15.0)
MCH: 31.4 pg (ref 26.0–34.0)
MCHC: 31.3 g/dL (ref 30.0–36.0)
MCV: 100.3 fL — ABNORMAL HIGH (ref 78.0–100.0)
Platelets: 433 10*3/uL — ABNORMAL HIGH (ref 150–400)
RBC: 3.31 MIL/uL — ABNORMAL LOW (ref 3.87–5.11)
RDW: 15.9 % — ABNORMAL HIGH (ref 11.5–15.5)
WBC: 11.9 10*3/uL — ABNORMAL HIGH (ref 4.0–10.5)

## 2018-02-08 LAB — PROTIME-INR
INR: 1.19
Prothrombin Time: 15 seconds (ref 11.4–15.2)

## 2018-02-08 LAB — GLUCOSE, CAPILLARY
GLUCOSE-CAPILLARY: 161 mg/dL — AB (ref 65–99)
GLUCOSE-CAPILLARY: 179 mg/dL — AB (ref 65–99)
GLUCOSE-CAPILLARY: 420 mg/dL — AB (ref 65–99)
GLUCOSE-CAPILLARY: 80 mg/dL (ref 65–99)
Glucose-Capillary: 165 mg/dL — ABNORMAL HIGH (ref 65–99)

## 2018-02-08 LAB — URINALYSIS, ROUTINE W REFLEX MICROSCOPIC
Bilirubin Urine: NEGATIVE
Glucose, UA: NEGATIVE mg/dL
Hgb urine dipstick: NEGATIVE
Ketones, ur: NEGATIVE mg/dL
LEUKOCYTES UA: NEGATIVE
NITRITE: NEGATIVE
PROTEIN: NEGATIVE mg/dL
Specific Gravity, Urine: 1.018 (ref 1.005–1.030)
pH: 5 (ref 5.0–8.0)

## 2018-02-08 LAB — INFLUENZA PANEL BY PCR (TYPE A & B)
Influenza A By PCR: NEGATIVE
Influenza B By PCR: NEGATIVE

## 2018-02-08 LAB — HEPARIN LEVEL (UNFRACTIONATED): Heparin Unfractionated: 0.56 IU/mL (ref 0.30–0.70)

## 2018-02-08 MED ORDER — SODIUM CHLORIDE 0.45 % IV SOLN
INTRAVENOUS | Status: DC
  Administered 2018-02-08 – 2018-02-09 (×2): via INTRAVENOUS

## 2018-02-08 MED ORDER — SODIUM CHLORIDE 0.9 % IV SOLN
3.0000 g | Freq: Four times a day (QID) | INTRAVENOUS | Status: DC
  Administered 2018-02-08 – 2018-02-12 (×17): 3 g via INTRAVENOUS
  Filled 2018-02-08 (×18): qty 3

## 2018-02-08 MED ORDER — DIAZEPAM 2 MG PO TABS
2.0000 mg | ORAL_TABLET | Freq: Two times a day (BID) | ORAL | Status: DC | PRN
  Administered 2018-02-10 – 2018-02-12 (×2): 2 mg via ORAL
  Filled 2018-02-08 (×2): qty 1

## 2018-02-08 MED ORDER — LURASIDONE HCL 40 MG PO TABS
80.0000 mg | ORAL_TABLET | Freq: Two times a day (BID) | ORAL | Status: DC
  Administered 2018-02-08 – 2018-02-10 (×5): 80 mg via ORAL
  Filled 2018-02-08 (×6): qty 2

## 2018-02-08 MED ORDER — INSULIN ASPART 100 UNIT/ML ~~LOC~~ SOLN
0.0000 [IU] | Freq: Every day | SUBCUTANEOUS | Status: DC
  Administered 2018-02-09: 2 [IU] via SUBCUTANEOUS
  Administered 2018-02-10: 3 [IU] via SUBCUTANEOUS

## 2018-02-08 MED ORDER — QUETIAPINE FUMARATE 100 MG PO TABS
600.0000 mg | ORAL_TABLET | Freq: Every day | ORAL | Status: DC
  Administered 2018-02-08 – 2018-02-09 (×2): 600 mg via ORAL
  Filled 2018-02-08 (×2): qty 6

## 2018-02-08 MED ORDER — HYDRALAZINE HCL 20 MG/ML IJ SOLN: 10.0000 mg | Freq: Three times a day (TID) | INTRAMUSCULAR | Status: DC | PRN

## 2018-02-08 MED ORDER — LURASIDONE HCL 40 MG PO TABS: 80.0000 mg | ORAL_TABLET | Freq: Two times a day (BID) | ORAL | Status: DC

## 2018-02-08 MED ORDER — METOPROLOL SUCCINATE ER 100 MG PO TB24
100.0000 mg | ORAL_TABLET | Freq: Two times a day (BID) | ORAL | Status: DC
  Administered 2018-02-08 – 2018-02-10 (×4): 100 mg via ORAL
  Filled 2018-02-08 (×4): qty 1

## 2018-02-08 MED ORDER — ACETAMINOPHEN 500 MG PO TABS
500.0000 mg | ORAL_TABLET | Freq: Once | ORAL | Status: AC
  Administered 2018-02-08: 500 mg via ORAL
  Filled 2018-02-08: qty 1

## 2018-02-08 MED ORDER — INSULIN ASPART 100 UNIT/ML ~~LOC~~ SOLN
0.0000 [IU] | Freq: Three times a day (TID) | SUBCUTANEOUS | Status: DC
  Administered 2018-02-08: 15 [IU] via SUBCUTANEOUS
  Administered 2018-02-08 – 2018-02-09 (×2): 3 [IU] via SUBCUTANEOUS
  Administered 2018-02-09: 2 [IU] via SUBCUTANEOUS
  Administered 2018-02-09: 3 [IU] via SUBCUTANEOUS
  Administered 2018-02-10: 8 [IU] via SUBCUTANEOUS
  Administered 2018-02-10 (×2): 3 [IU] via SUBCUTANEOUS
  Administered 2018-02-11: 2 [IU] via SUBCUTANEOUS
  Administered 2018-02-11: 3 [IU] via SUBCUTANEOUS
  Administered 2018-02-11: 2 [IU] via SUBCUTANEOUS
  Administered 2018-02-12 (×2): 3 [IU] via SUBCUTANEOUS
  Administered 2018-02-13: 2 [IU] via SUBCUTANEOUS
  Administered 2018-02-13: 5 [IU] via SUBCUTANEOUS

## 2018-02-08 MED ORDER — ACETAMINOPHEN 325 MG PO TABS
650.0000 mg | ORAL_TABLET | Freq: Four times a day (QID) | ORAL | Status: DC | PRN
  Administered 2018-02-08 (×3): 650 mg via ORAL
  Filled 2018-02-08 (×3): qty 2

## 2018-02-08 MED ORDER — BENZTROPINE MESYLATE 0.5 MG PO TABS
1.0000 mg | ORAL_TABLET | Freq: Two times a day (BID) | ORAL | Status: DC
  Administered 2018-02-08 – 2018-02-11 (×7): 1 mg via ORAL
  Filled 2018-02-08 (×7): qty 2

## 2018-02-08 MED ORDER — WARFARIN SODIUM 5 MG PO TABS
5.0000 mg | ORAL_TABLET | Freq: Once | ORAL | Status: AC
  Administered 2018-02-08: 5 mg via ORAL
  Filled 2018-02-08: qty 1

## 2018-02-08 MED ORDER — QUETIAPINE FUMARATE 100 MG PO TABS: 400.0000 mg | ORAL_TABLET | Freq: Every day | ORAL | Status: DC

## 2018-02-08 MED ORDER — AMIODARONE HCL 200 MG PO TABS
200.0000 mg | ORAL_TABLET | Freq: Two times a day (BID) | ORAL | Status: DC
  Administered 2018-02-08 – 2018-02-10 (×6): 200 mg via ORAL
  Filled 2018-02-08 (×5): qty 1

## 2018-02-08 NOTE — Clinical Social Work Note (Signed)
Patient's sister not at bedside. CSW tried calling. Voicemail not set up. Will try again later or in the morning.  Charlynn CourtSarah Treyson Axel, CSW [phone]

## 2018-02-08 NOTE — Progress Notes (Signed)
ANTIBIOTIC CONSULT NOTE - INITIAL  Pharmacy Consult for ampicillin/sulbactam Indication: aspiration PNA  Allergies  Allergen Reactions  . Sulfa Antibiotics Rash    Patient Measurements: Height: _0  (157.5 cm) Weight: 153 lb 3.5 oz (69.5 kg) IBW/kg (Calculated) : 50.1   Vital Signs: Temp: 98.6 F (37 C) (04/11 1147) Temp Source: Oral (04/11 1147) BP: 141/89 (04/11 1147) Pulse Rate: 91 (04/11 0800) Intake/Output from previous day: 04/10 0701 - 04/11 0700 In: 1522 [P.O.:360; I.V.:862; IV Piggyback:300] Out: 2275 [Urine:2275] Intake/Output from this shift: Total I/O In: 286.7 [P.O.:120; I.V.:116.7; Other:50] Out: 150 [Urine:150]  Labs: Recent Labs    02/06/18 0426 02/07/18 0206 02/08/18 0220 02/08/18 0723  WBC 8.8 12.5* 11.9*  --   HGB 9.3* 9.8* 10.4*  --   PLT 288 409* 433*  --   CREATININE 0.90 0.99  --  1.06*   Estimated Creatinine Clearance: 54.8 mL/min (A) (by C-G formula based on SCr of 1.06 mg/dL (H)). No results for input(s): VANCOTROUGH, VANCOPEAK, VANCORANDOM, GENTTROUGH, GENTPEAK, GENTRANDOM, TOBRATROUGH, TOBRAPEAK, TOBRARND, AMIKACINPEAK, AMIKACINTROU, AMIKACIN in the last 72 hours.   Microbiology: Recent Results (from the past 720 hour(s))  Culture, blood (routine x 2)     Status: None   Collection Time: 01/27/18  8:58 AM  Result Value Ref Range Status   Specimen Description LEFT ANTECUBITAL  Final   Special Requests   Final    AEROBIC BOTTLE ONLY Blood Culture results may not be optimal due to an inadequate volume of blood received in culture bottles   Culture   Final    NO GROWTH 5 DAYS Performed at [facility], [address] [place], [place] [address]    Report Status 02/01/2018 FINAL  Final  Culture, blood (routine x 2)     Status: None   Collection Time: 01/27/18  9:04 AM  Result Value Ref Range Status   Specimen Description BLOOD RIGHT ARM  Final   Special Requests   Final    BOTTLES DRAWN AEROBIC AND ANAEROBIC Blood Culture results  may not be optimal due to an inadequate volume of blood received in culture bottles   Culture   Final    NO GROWTH 5 DAYS Performed at [facility], [address] [place], [place] [address]    Report Status 02/01/2018 FINAL  Final  MRSA PCR Screening     Status: None   Collection Time: 01/27/18  1:31 PM  Result Value Ref Range Status   MRSA by PCR NEGATIVE NEGATIVE Final    Comment:        The GeneXpert MRSA Assay (FDA approved for NASAL specimens only), is one component of a comprehensive MRSA colonization surveillance program. It is not intended to diagnose MRSA infection nor to guide or monitor treatment for MRSA infections. Performed at [facility], [place] [address] [place],  [address]   Respiratory Panel by PCR     Status: None   Collection Time: 01/27/18  2:55 PM  Result Value Ref Range Status   Adenovirus NOT DETECTED NOT DETECTED Final   Coronavirus 229E NOT DETECTED NOT DETECTED Final   Coronavirus HKU1 NOT DETECTED NOT DETECTED Final   Coronavirus NL63 NOT DETECTED NOT DETECTED Final   Coronavirus OC43 NOT DETECTED NOT DETECTED Final   Metapneumovirus NOT DETECTED NOT DETECTED Final   Rhinovirus / Enterovirus NOT DETECTED NOT DETECTED Final   Influenza A NOT DETECTED NOT DETECTED Final   Influenza B NOT DETECTED NOT DETECTED Final   Parainfluenza Virus  1 NOT DETECTED NOT DETECTED Final   Parainfluenza Virus 2 NOT DETECTED NOT DETECTED Final   Parainfluenza Virus 3 NOT DETECTED NOT DETECTED Final   Parainfluenza Virus 4 NOT DETECTED NOT DETECTED Final   Respiratory Syncytial Virus NOT DETECTED NOT DETECTED Final   Bordetella pertussis NOT DETECTED NOT DETECTED Final   Chlamydophila pneumoniae NOT DETECTED NOT DETECTED Final   Mycoplasma pneumoniae NOT DETECTED NOT DETECTED Final    Comment: Performed at [facility], [place] [address], [place], [place] [address]  Culture, respiratory (NON-Expectorated)     Status: None   Collection Time: 01/28/18   4:15 PM  Result Value Ref Range Status   Specimen Description TRACHEAL ASPIRATE  Final   Special Requests NONE  Final   Gram Stain   Final    ABUNDANT WBC PRESENT,BOTH PMN AND MONONUCLEAR RARE SQUAMOUS EPITHELIAL CELLS PRESENT RARE GRAM NEGATIVE COCCI IN PAIRS Performed at [facility], [place] [address], [place], [place] [address]    Culture FEW CANDIDA ALBICANS  Final   Report Status 01/31/2018 FINAL  Final  Culture, bal-quantitative     Status: Abnormal   Collection Time: 01/31/18  1:52 PM  Result Value Ref Range Status   Specimen Description BRONCHIAL ALVEOLAR LAVAGE  Final   Special Requests Normal  Final   Gram Stain   Final    ABUNDANT WBC PRESENT, PREDOMINANTLY PMN NO ORGANISMS SEEN Performed at [facility], [place] [address], [place],  [address]    Culture 10,000 COLONIES/mL CANDIDA ALBICANS (A)  Final   Report Status 02/03/2018 FINAL  Final    Medical History: Past Medical History:  Diagnosis Date  . Anxiety   . Diabetes ([place])   . Dysphagia   . Hyperlipidemia   . Hypertension   . Mild mental slowing   . Poor historian   . Schizophrenia ([place])   . Seizures ([place])    has not had a seizure since last year. Unknown orgin- no family available to come and patient does not know.    Assessment: 57 yo female with possible aspiration PNA, Mild bilateral interstitial opacities. Afebrile, WBC 11.9  Plan:  Ampicillin/sulbactam 3gm IV q6h Monitor fever curve, CBC, LOT, and de-escalate when clinically appropriate  Dexter Signor A. Levada Dy, PharmD, [place] Pager: [phone]  02/08/2018,3:33 PM

## 2018-02-08 NOTE — Progress Notes (Addendum)
PROGRESS NOTE    Natasha Martin  MPN:[id number] DOB: [birth year] DOA: 01/27/2018 PCP: Neale Burly, MD   Brief Narrative: this 57 year old with diabetes felt mental delay and schizophrenia who also has a reported history of seizures in the chart. For the past 2-3 days she has been having a productive cough and she was found in the bathroom this morning unresponsive. When EMS arrived her saturations were in the 20s. She was taking to [facility] where she was intubated and chest x-ray has shown diffuse bilateral infiltrates.Admitted3/30/2019  Significant Events: 01/27/2018: Transferred from [facility]; intubated; CXR with bilateral infiltrates 01/28/2018: ARDS protocol; Nimbex started 01/30/2018: Nimbex stopped; required propofol > drop in BP and levo started 01/31/2018: Bronchoscopy (cultures with yeast only) 02/05/2018: Extubated, A-line out     Assessment & Plan:   Active Problems:   Acute respiratory failure (HCC)   ARDS (adult respiratory distress syndrome) (HCC)  Acute resp failure 2/2 ARDS likely 2/2 aspiration event.  Extubated 4-08. Now on 3 L oxygen.  Speech swallow evaluation.  Received 7 days of antibiotics.  Follow WBC.  Hold lasix. Taper off solumedrol.   Fevers;  Will pan-culture. Will get blood cultures, chest x ary.  She has had 2 loose BM, will monitor for Diarrhea. If persist might need to check for C diff.  Will add Unasyn to cover for aspiration.   A fib;  Resume amiodarone, lopressor.  On heparin Gtt.  Started  coumadin, now that she pass swallow evaluation.   HTN;  Resume metoprolol.  Hydralazine PRN.   Septic shock; Off levophed.  Treated with IV antibiotics.   Aortic and mitral valve diseases;  Cardiology was consulted, no work up during this admission.   Anemia;  Hb at 3, on admission, probably not accurate.  Hb now at 9. Follow trend.   DM On lantus and SSI.   Mild mental delay.  B 12 731. , TSH 0.8.   Seizure. ?  She was  stared on IV keppra on admission. Per sister patient was not taking ; carbamazepine.   Hypernatremia; hold lasix. Encourage oral intake. If chest x ray negative for pulmonary edema will consider IV fluids.   Schizophrenia, depression;  Will resume night dose Seroquel. Hold day dose as patient is more sleepy , probably due to fever.  She was on cogentin and latuda, will consult psych to help with adjusting medications.  Will resume lower dose valium PRN to avoid withdrawal.   DVT prophylaxis: heparin.  Code Status: full code.  Family Communication: no family at bedside.  Disposition Plan: PT eval.    Consultants:   CCM   Procedures:  Left ventricle: The cavity size was normal. Wall thickness was   increased in a pattern of mild LVH. Systolic function was normal.   The estimated ejection fraction was in the range of 55% to 60%.   Wall motion was normal; there were no regional wall motion   abnormalities. Features are consistent with a pseudonormal left   ventricular filling pattern, with concomitant abnormal relaxation   and increased filling pressure (grade 2 diastolic dysfunction). - Aortic valve: The aortic valve is markedly abnormal. Appears   trileaflet but I cannot see the left coronary cusp well. The   non-coronary cusp is markedly thickened and calcified. The right   coronary cusp may be fused. There is significant turbulence over   the valve with mild stenosis. Would strongly consider TEE. There   was trivial regurgitation. Valve area (VTI):  1.66 cm^2. Valve   area (Vmax): 1.66 cm^2. Valve area (Vmean): 1.72 cm^2. - Mitral valve: Calcified annulus. - Left atrium: The atrium was moderately dilated. - Atrial septum: There was increased thickness of the septum,   consistent with lipomatous hypertrophy. - Impressions: Abnormal aortic valve as described above. Also with   signifcant MAC for her age. Would strongly consider TEE.   Antimicrobials: Azithromycin 3/30 >>  4/3 Vancomycin 3/30 >> 4/1 Zosyn 3/30 >> 4/6    Subjective: She is sleepy, sluggish having fever 103. Still has foley, was not able to be removed yesterday due to inability to feel urge.  Had one watery BM this am.    Objective: Vitals:   02/08/18 0425 02/08/18 0438 02/08/18 0500 02/08/18 0800  BP: (!) 155/64   (!) 171/64  Pulse: 88     Resp: 20  (!) 27 (!) 31  Temp: 100.2 F (37.9 C)   (!) 103 F (39.4 C)  TempSrc: Oral   Oral  SpO2: 95%  96% 92%  Weight:  69.5 kg (153 lb 3.5 oz)    Height:        Intake/Output Summary (Last 24 hours) at 02/08/2018 0830 Last data filed at 02/08/2018 0800 Gross per 24 hour  Intake 1359.4 ml  Output 2425 ml  Net -1065.6 ml   Filed Weights   02/06/18 0500 02/07/18 0403 02/08/18 0438  Weight: 72.7 kg (160 lb 4.4 oz) 71.9 kg (158 lb 8.2 oz) 69.5 kg (153 lb 3.5 oz)    Examination:  General exam: sleepy Respiratory system: normal respiratory effort, no wheezing,  Cardiovascular system: S 1, S 2 RRR Gastrointestinal system: BS present, soft, nt Central nervous system: sleepy.  Extremities: moves extremities passively  Skin: No rash    Data Reviewed: I have personally reviewed following labs and imaging studies  CBC: Recent Labs  Lab 02/04/18 0350 02/05/18 0449 02/06/18 0426 02/07/18 0206 02/08/18 0220  WBC 7.6 8.1 8.8 12.5* 11.9*  HGB 7.4* 7.7* 9.3* 9.8* 10.4*  HCT 23.4* 24.7* 30.2* 31.3* 33.2*  MCV 98.3 99.2 98.7 97.8 100.3*  PLT 189 207 288 409* 034*   Basic Metabolic Panel: Recent Labs  Lab 02/01/18 1034 02/02/18 0414 02/04/18 0350 02/05/18 0449 02/06/18 0426 02/07/18 0206 02/08/18 0723  NA  --  142 141 139 140 143 149*  K  --  4.0 3.7 3.7 3.9 3.7 4.1  CL  --  102 99* 100* 99* 100* 112*  CO2  --  _0 GLUCOSE  --  190* 189* 258* 208* 172* 142*  BUN  --  10 20 22* 20 27* 39*  CREATININE  --  0.82 0.97 0.91 0.90 0.99 1.06*  CALCIUM  --  7.4* 8.1* 8.4* 9.1 9.1 8.8*  MG 1.6* 1.9 1.9  --  1.9  --    --   PHOS  --   --  4.2  --  4.3  --   --    GFR: Estimated Creatinine Clearance: 54.8 mL/min (A) (by C-G formula based on SCr of 1.06 mg/dL (H)). Liver Function Tests: No results for input(s): AST, ALT, ALKPHOS, BILITOT, PROT, ALBUMIN in the last 168 hours. No results for input(s): LIPASE, AMYLASE in the last 168 hours. No results for input(s): AMMONIA in the last 168 hours. Coagulation Profile: Recent Labs  Lab 02/07/18 1538 02/08/18 0220  INR 1.22 1.19   Cardiac Enzymes: No results for input(s): CKTOTAL, CKMB, CKMBINDEX, TROPONINI in the last 168 hours.  BNP (last 3 results) No results for input(s): PROBNP in the last 8760 hours. HbA1C: No results for input(s): HGBA1C in the last 72 hours. CBG: Recent Labs  Lab 02/07/18 0808 02/07/18 1744 02/07/18 2026 02/07/18 2357 02/08/18 0417  GLUCAP 188* 361* 354* 167* 161*   Lipid Profile: No results for input(s): CHOL, HDL, LDLCALC, TRIG, CHOLHDL, LDLDIRECT in the last 72 hours. Thyroid Function Tests: Recent Labs    02/07/18 1158  TSH 0.822   Anemia Panel: Recent Labs    02/07/18 1158  VITAMINB12 731   Sepsis Labs: Recent Labs  Lab 02/02/18 1059  PROCALCITON <0.10    Recent Results (from the past 240 hour(s))  Culture, bal-quantitative     Status: Abnormal   Collection Time: 01/31/18  1:52 PM  Result Value Ref Range Status   Specimen Description BRONCHIAL ALVEOLAR LAVAGE  Final   Special Requests Normal  Final   Gram Stain   Final    ABUNDANT WBC PRESENT, PREDOMINANTLY PMN NO ORGANISMS SEEN Performed at [facility], [place] [address], [place], [place] [address]    Culture 10,000 COLONIES/mL CANDIDA ALBICANS (A)  Final   Report Status 02/03/2018 FINAL  Final         Radiology Studies: Dg Swallowing Func-speech Pathology  Result Date: 02/07/2018 Objective Swallowing Evaluation: Type of Study: MBS-Modified Barium Swallow Study  Patient Details Name: CANDACE BEGUE MRN: [id number] Date of Birth:  [birth year] Today's Date: 02/07/2018 Time: SLP Start Time (ACUTE ONLY): 60 -SLP Stop Time (ACUTE ONLY): 1034 SLP Time Calculation (min) (ACUTE ONLY): 14 min Past Medical History: Past Medical History: Diagnosis Date . Anxiety  . Diabetes ([place])  . Dysphagia  . Hyperlipidemia  . Hypertension  . Mild mental slowing  . Poor historian  . Schizophrenia ([place])  . Seizures ([place])   has not had a seizure since last year. Unknown orgin- no family available to come and patient does not know. Past Surgical History: Past Surgical History: Procedure Laterality Date . BIOPSY  01/28/2014  Procedure: BIOPSY (Gastric);  Surgeon: Danie Binder, MD;  Location: [facility];  Service: Endoscopy;; . COLONOSCOPY WITH PROPOFOL N/A 01/28/2014  LOV:FIEPPI mucosa in the terminal ileum/small internal and external hemorrhoids . ESOPHAGOGASTRODUODENOSCOPY (EGD) WITH PROPOFOL N/A 01/28/2014  RJJ:OACZYS esophagitis/stricture at the gastro junction/moderate erosive . 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 POLYPECTOMY  01/28/2014  Procedure: POLYPECTOMY (Rectal);  Surgeon: Danie Binder, MD;  Location: [facility];  Service: Endoscopy;; . SAVORY DILATION N/A 01/28/2014  Procedure: SAVORY DILATION  (12.36m to 130m;  Surgeon: SaDanie BinderMD;  Location: [facility];  Service: Endoscopy;  Laterality: N/A; HPI: [facility] [age][facility] with report of productive cough, mental delay, and schizophrenia. She has a past medical history of Anxiety, Diabetes ([place] Dysphagia, Hyperlipidemia, Hypertension, Mild mental slowing, Poor historian, Schizophrenia ([place] and Seizures ([place] [facility] Hx includes Esophagogastroduodenoscopy (egd) with propofol (01/28/2014) and Savory dilation (01/28/2014). When EMS arrived her saturations were in the 20s. Pt was was intubated on arrival (01/27/18) and chest x-ray has showed diffuse bilateral infiltrates. ARDSlikely 2/2 aspirationevent.  Extubated 4/8. CXR (4/9) reveals No change to BILATERAL pulmonary opacities. CT Head (3/30) revealed no evidence of acute intracranial abnormality and mild chronic small-vessel white matter ischemic changes.  Subjective: Pt alert with impaired comprehension but alert throughout session Assessment / Plan / Recommendation CHL IP CLINICAL IMPRESSIONS 02/07/2018 Clinical Impression --  SLP Visit Diagnosis Dysphagia, unspecified (R13.10) Attention and concentration deficit following -- Frontal lobe and executive function deficit following -- Impact on safety and function --   CHL IP TREATMENT RECOMMENDATION 02/07/2018 Treatment Recommendations Therapy as outlined in treatment plan below   Prognosis 02/07/2018 Prognosis for Safe Diet Advancement Good Barriers to Reach Goals Cognitive deficits Barriers/Prognosis Comment -- CHL IP DIET RECOMMENDATION 02/07/2018 SLP Diet Recommendations Honey thick liquids;Dysphagia 1 (Puree) solids Liquid Administration via Cup;No straw Medication Administration Crushed with puree Compensations Slow rate;Small sips/bites Postural Changes Seated upright at 90 degrees   CHL IP OTHER RECOMMENDATIONS 02/07/2018 Recommended Consults -- Oral Care Recommendations Oral care BID Other Recommendations --   CHL IP FOLLOW UP RECOMMENDATIONS 02/07/2018 Follow up Recommendations (No Data)   CHL IP FREQUENCY AND DURATION 02/07/2018 Speech Therapy Frequency (ACUTE ONLY) min 2x/week Treatment Duration 2 weeks      CHL IP ORAL PHASE 02/07/2018 Oral Phase Impaired Oral - Pudding Teaspoon -- Oral - Pudding Cup -- Oral - Honey Teaspoon -- Oral - Honey Cup Lingual/palatal residue;Decreased bolus cohesion;Right anterior bolus loss;Left anterior bolus loss Oral - Nectar Teaspoon -- Oral - Nectar Cup Right anterior bolus loss;Left anterior bolus loss;Lingual/palatal residue;Premature spillage;Decreased bolus cohesion Oral - Nectar Straw Right anterior bolus loss;Left anterior bolus loss;Lingual/palatal residue;Premature  spillage;Decreased bolus cohesion Oral - Thin Teaspoon -- Oral - Thin Cup Right anterior bolus loss;Left anterior bolus loss;Lingual/palatal residue;Premature spillage;Decreased bolus cohesion Oral - Thin Straw -- Oral - Puree -- Oral - Mech Soft -- Oral - Regular Delayed oral transit;Weak lingual manipulation Oral - Multi-Consistency -- Oral - Pill -- Oral Phase - Comment --  CHL IP PHARYNGEAL PHASE 02/07/2018 Pharyngeal Phase Impaired Pharyngeal- Pudding Teaspoon -- Pharyngeal -- Pharyngeal- Pudding Cup -- Pharyngeal -- Pharyngeal- Honey Teaspoon -- Pharyngeal -- Pharyngeal- Honey Cup Pharyngeal residue - valleculae Pharyngeal -- Pharyngeal- Nectar Teaspoon -- Pharyngeal -- Pharyngeal- Nectar Cup Delayed swallow initiation-pyriform sinuses;Pharyngeal residue - valleculae Pharyngeal -- Pharyngeal- Nectar Straw Penetration/Aspiration during swallow;Reduced airway/laryngeal closure Pharyngeal Material enters airway, passes BELOW cords without attempt by patient to eject out (silent aspiration) Pharyngeal- Thin Teaspoon -- Pharyngeal -- Pharyngeal- Thin Cup Penetration/Aspiration before swallow;Penetration/Aspiration during swallow Pharyngeal Material enters airway, passes BELOW cords without attempt by patient to eject out (silent aspiration) Pharyngeal- Thin Straw -- Pharyngeal -- Pharyngeal- Puree -- Pharyngeal -- Pharyngeal- Mechanical Soft -- Pharyngeal -- Pharyngeal- Regular WFL Pharyngeal -- Pharyngeal- Multi-consistency -- Pharyngeal -- Pharyngeal- Pill -- Pharyngeal -- Pharyngeal Comment --  CHL IP CERVICAL ESOPHAGEAL PHASE 02/07/2018 Cervical Esophageal Phase Impaired Pudding Teaspoon -- Pudding Cup -- Honey Teaspoon -- Honey Cup -- Nectar Teaspoon -- Nectar Cup -- Nectar Straw -- Thin Teaspoon -- Thin Cup -- Thin Straw -- Puree -- Mechanical Soft -- Regular -- Multi-consistency -- Pill -- Cervical Esophageal Comment -- No flowsheet data found. [place] Siren 02/07/2018, 1:47 PM  Orbie Pyo Colvin Caroli.Ed  CCC-SLP Pager [phone]                  Scheduled Meds: . chlorhexidine  15 mL Mouth Rinse BID  . insulin aspart  0-20 Units Subcutaneous Q4H  . insulin glargine  30 Units Subcutaneous Daily  . mouth rinse  15 mL Mouth Rinse q12n4p  . mupirocin cream   Topical Daily  . pantoprazole (PROTONIX) IV  40 mg Intravenous Q24H  . potassium chloride  40 mEq Oral Daily  . sennosides  5 mL Oral Daily  . Warfarin - Pharmacist Dosing Inpatient   Does not apply  q1800   Continuous Infusions: . sodium chloride 250 mL (02/06/18 0952)  . heparin 1,500 Units/hr (02/07/18 2227)  . levETIRAcetam Stopped (02/08/18 0150)     LOS: 12 days    Time spent: 35 minutes.     Elmarie Shiley, MD [facility] Pager [phone]  If 7PM-7AM, please contact night-coverage [email or web address] Password [email or web address] 02/08/2018, 8:30 AM

## 2018-02-08 NOTE — Progress Notes (Signed)
Patient's sister and [place], [place], requested that the patient please be placed back on her psych medications especially her seroquel and cogentin.

## 2018-02-08 NOTE — Evaluation (Signed)
Occupational Therapy Evaluation Patient Details Name: Natasha Martin MRN: [id number] DOB: May 10, 1961 Today's Date: 02/08/2018    History of Present Illness Pt is a 57 y.o. female admitted 01/27/18 after being found unresponsive; EMS found saturations in 20s. Pt was intubated upon arrival. CT shows no acute intracranial abnormality; mild chronic small-vessel ischemic changes. CXR showed diffuse bilateral infiltrates. Worked up for ARDS likely secondary to aspiration event. Extubated 4/8. PMH includes mild mental slowing, seizures, schizophrenia, HTN, DM, anxiety.     Clinical Impression   PTA (per chart review) Pt was independent in ADL - relied on her sister for cooking; was independent in mobility and was a lively talkative person. Pt is currently max A for ADL - weakness and deficits in ROM and strength in BUE and overall. Pt with inconsistent cognition demonstrated during session and Pt requiring multimodal cues throughout session with increased time for processing. Pt also required max A for verbalization throughout session but denies sore throat today. Pt max A +2 for lateral transfer to drop arm recliner this session. Pt will require skilled OT in the acute setting and afterwards at SNF level to maximize safety and independence in ADL and functional transfers. Next session to continue to assess cognition (it is possible to suspect hypoxic brain injury?) and BUE function as well as bed mobility and OOB transfers.     Follow Up Recommendations  SNF;Supervision/Assistance - 24 hour    Equipment Recommendations  Other (comment)(defer to next venue)    Recommendations for Other Services       Precautions / Restrictions Precautions Precautions: Fall Restrictions Weight Bearing Restrictions: No      Mobility Bed Mobility Overal bed mobility: Needs Assistance Bed Mobility: Rolling;Supine to Sit Rolling: Max assist;+2 for safety/equipment   Supine to sit: Max assist;+2 for physical  assistance;+2 for safety/equipment;HOB elevated     General bed mobility comments: Pt inconsistent with participation/assistance; Pt required increased time and single step multimodal cues for all aspects. +2 assist throughout for trunk elevation and use of bed pad to bring hips EOB  Transfers Overall transfer level: Needs assistance Equipment used: 2 person hand held assist Transfers: Lateral/Scoot Transfers(with bed pad)          Lateral/Scoot Transfers: Max assist;+2 physical assistance;+2 safety/equipment(with bed pad) General transfer comment: drop arm recliner used, and bed pad to assist, Pt assisting by leaning forward with cues - otherwise total A for lateral transfer    Balance Overall balance assessment: Needs assistance Sitting-balance support: No upper extremity supported;Bilateral upper extremity supported Sitting balance-Leahy Scale: Poor Sitting balance - Comments: Pt fatigued quickly in unsupported sitting position with slight left lateral lean this session Postural control: Left lateral lean(slight)                                 ADL either performed or assessed with clinical judgement   ADL Overall ADL's : Needs assistance/impaired Eating/Feeding: Sitting;Maximal assistance Eating/Feeding Details (indicate cue type and reason): Pt unable to bring hand to mouth without physical assist, and cannot grasp utensils.  Grooming: Maximal assistance;Brushing hair;Wash/dry face;Sitting Grooming Details (indicate cue type and reason): Pt unable to bring RUE to face without physical assist from therapist Upper Body Bathing: Maximal assistance   Lower Body Bathing: Maximal assistance   Upper Body Dressing : Maximal assistance   Lower Body Dressing: Total assistance;Bed level Lower Body Dressing Details (indicate cue type and reason): for socks, and adult  diaper for transfer Toilet Transfer: Maximal assistance;+2 for physical assistance;+2 for  safety/equipment;BSC;Requires drop arm Toilet Transfer Details (indicate cue type and reason): lateral transfer with pads to recliner to simulate BSC transfer Toileting- Clothing Manipulation and Hygiene: Total assistance               Vision   Additional Comments: will continue to monitor as cognition improves     Perception     Praxis      Pertinent Vitals/Pain Pain Assessment: No/denies pain Faces Pain Scale: Hurts little more Pain Intervention(s): Monitored during session;Repositioned     Hand Dominance Right(She reached for ADL items with the RUE)   Extremity/Trunk Assessment Upper Extremity Assessment Upper Extremity Assessment: RUE deficits/detail;LUE deficits/detail;Difficult to assess due to impaired cognition RUE Deficits / Details: shoulder FF to 30 degrees, grasp unable to hold utensils or grooming items, grossly 2+/5 RUE Coordination: decreased gross motor;decreased fine motor LUE Deficits / Details: shoulder FF to 45 degrees, weak grasp and requires assist to hold utensils or grooming items, grossly 3/5 strength overall LUE Coordination: decreased fine motor;decreased gross motor   Lower Extremity Assessment Lower Extremity Assessment: Defer to PT evaluation   Cervical / Trunk Assessment Cervical / Trunk Assessment: Kyphotic   Communication Communication Communication: Expressive difficulties;Other (comment)(soft and hoarse post extubation - required cues to verbalize)   Cognition Arousal/Alertness: Awake/alert Behavior During Therapy: Flat affect Overall Cognitive Status: Impaired/Different from baseline Area of Impairment: Attention;Following commands;Safety/judgement;Awareness;Problem solving                   Current Attention Level: Sustained   Following Commands: Follows one step commands inconsistently;Follows one step commands with increased time Safety/Judgement: Decreased awareness of deficits Awareness: Intellectual Problem  Solving: Slow processing;Decreased initiation;Difficulty sequencing;Requires verbal cues;Requires tactile cues General Comments: Pt inconsistent throughout session with direction following (no patterns noticed), Pt required max cues for verbalization throughout session, processing seemed delayed throughout session. Cognition remains inconsistent and question potential for hypoxic brain injury as we continue to decifer baseline vs coming off extubation/fever/not being on psych meds. Per chart Pt was very talkative, and independent (with the exception of cooking) PTA. Will continue to monitor   General Comments  Pt with 103 fever today, got BP meds and tylenol at the beginning of session.     Exercises     Shoulder Instructions      Home Living Family/patient expects to be discharged to:: Skilled nursing facility Living Arrangements: Other relatives                               Additional Comments: Lives with sister and sister's grandkids      Prior Functioning/Environment          Comments: Pt poor historian. Sister reports pt indep with all mobility and most ADLs. Sister cooks for pt        OT Problem List: Decreased strength;Decreased range of motion;Decreased activity tolerance;Impaired balance (sitting and/or standing);Decreased cognition;Decreased coordination;Decreased safety awareness;Decreased knowledge of use of DME or AE;Impaired UE functional use      OT Treatment/Interventions: Self-care/ADL training;Therapeutic exercise;Energy conservation;DME and/or AE instruction;Manual therapy;Therapeutic activities;Cognitive remediation/compensation;Patient/family education;Balance training    OT Goals(Current goals can be found in the care plan section) Acute Rehab OT Goals Patient Stated Goal: Get stronger OT Goal Formulation: With patient Time For Goal Achievement: 02/22/18 Potential to Achieve Goals: Fair ADL Goals Pt Will Perform Eating: with min assist;with  adaptive utensils;sitting Pt  Will Perform Grooming: with min assist;sitting;with adaptive equipment Additional ADL Goal #1: Pt will demonstrate bed mobility at min A level prior to engaging in ADL activity Additional ADL Goal #2: Pt will demonstrate selective attention while participating in a min distracting environment while completing an ADL task  OT Frequency: Min 2X/week   Barriers to D/C:            Co-evaluation PT/OT/SLP Co-Evaluation/Treatment: Yes Reason for Co-Treatment: Complexity of the patient's impairments (multi-system involvement);Necessary to address cognition/behavior during functional activity;For patient/therapist safety;To address functional/ADL transfers PT goals addressed during session: Mobility/safety with mobility;Balance;Strengthening/ROM OT goals addressed during session: ADL's and self-care;Strengthening/ROM      AM-PAC PT "6 Clicks" Daily Activity     Outcome Measure Help from another person eating meals?: A Lot Help from another person taking care of personal grooming?: A Lot Help from another person toileting, which includes using toliet, bedpan, or urinal?: Total Help from another person bathing (including washing, rinsing, drying)?: A Lot Help from another person to put on and taking off regular upper body clothing?: Total Help from another person to put on and taking off regular lower body clothing?: Total 6 Click Score: 9   End of Session Equipment Utilized During Treatment: Oxygen(removed to clean and Pt dropped to mid 80's - put back on) Nurse Communication: Mobility status(communicated with NT and RN)  Activity Tolerance: Patient limited by fatigue;Patient limited by lethargy Patient left: in chair;with call bell/phone within reach  OT Visit Diagnosis: Unsteadiness on feet (R26.81);Other abnormalities of gait and mobility (R26.89);Muscle weakness (generalized) (M62.81);Feeding difficulties (R63.3);Other symptoms and signs involving cognitive  function                Time: [id number] OT Time Calculation (min): 42 min Charges:  OT General Charges $OT Visit: 1 Visit OT Evaluation $OT Eval Moderate Complexity: 1 Mod OT Treatments $Self Care/Home Management : 8-22 mins G-Codes:     Sherryl MangesLaura Kumar Falwell OTR/L [phone]  Natasha BioLaura J Avanti Martin 02/08/2018, 11:44 AM

## 2018-02-08 NOTE — Plan of Care (Signed)
Continue current care plan, pt need max assist to get up to chair. Pt have temp 103F., Requires assistance with feeding, on dysphagia 1 honey thick liquids

## 2018-02-08 NOTE — Progress Notes (Signed)
ANTICOAGULATION CONSULT NOTE - Follow Up Consult  Pharmacy Consult for heparin > warfarin Indication: atrial fibrillation  Allergies  Allergen Reactions  . Sulfa Antibiotics Rash    Patient Measurements: Height: 5\' 2"  (157.5 cm) Weight: 153 lb 3.5 oz (69.5 kg) IBW/kg (Calculated) : 50.1 Heparin Dosing Weight: 68 kg  Vital Signs: Temp: 101.6 F (38.7 C) (04/11 1035) Temp Source: Oral (04/11 1035) BP: 171/64 (04/11 0800) Pulse Rate: 91 (04/11 0800)  Labs: Recent Labs    02/06/18 0426  02/07/18 0206 02/07/18 0902 02/07/18 1538 02/08/18 0220 02/08/18 0723  HGB 9.3*  --  9.8*  --   --  10.4*  --   HCT 30.2*  --  31.3*  --   --  33.2*  --   PLT 288  --  409*  --   --  433*  --   LABPROT  --   --   --   --  15.3* 15.0  --   INR  --   --   --   --  1.22 1.19  --   HEPARINUNFRC  --    < > 0.89* 0.75*  --  0.56  --   CREATININE 0.90  --  0.99  --   --   --  1.06*   < > = values in this interval not displayed.    Estimated Creatinine Clearance: 54.8 mL/min (A) (by C-G formula based on SCr of 1.06 mg/dL (H)).  Assessment: [age] yoF with a history of AFib on warfarin PTA found unresponsive at home. Pharmacy consulted to start IV heparin while holding oral AC (she failed the swallow evaluation) -heparin level = 056 on 1500 units/hr -INR= 1.19  Home warfarin dose: 2mg  daily except 3mg  on Sat and Sun  Goal of Therapy:  Heparin level 0.3-0.7 units/ml Monitor platelets by anticoagulation protocol: Yes   Plan:  -Coumadin 5mg  po today -Check daily heparin level and CBC  Harland GermanAndrew Reco Shonk, PharmD Clinical Pharmacist Clinical phone from 8:30-4:00 is [phone] After 4pm, please call [facility] [phone]) for assistance. 02/08/2018 10:59 AM

## 2018-02-08 NOTE — Progress Notes (Signed)
  Speech Language Pathology Treatment: Dysphagia  Patient Details Name: Retta Macaula S Bergeron MRN: [id number] DOB: Oct 02, 1961 Today's Date: 02/08/2018 Time: 0902-0919 SLP Time Calculation (min) (ACUTE ONLY): 17 min  Assessment / Plan / Recommendation Clinical Impression  Pt seen for dysphagia intervention following MBS yesterday. Responds to questions with gestures with max encouragement to verbalize needs/thoughts. Vocal intensity very low but can achieve phonation with cues for deep inhalation. She attempts to assist with feeding today and provides approximately 40%. Decreased oral transit time, no s/s aspiration with slightly increased effort during volitional throat clear however not functional. Running a 103; repeat CXR revealed significantly improved lung aeration when compared to multiple studies. No new lung abnormalities. She continues to need full supervision and assist for feeding- please allow pt to hold utensil, cup with assist. Continue honey thick liquids and puree diet.    HPI HPI: Pt is a 57 year old who presented [facility] after being found unresponsive with report of productive cough, mental delay, and schizophrenia. She has a past medical history of Anxiety, Diabetes (HCC), Dysphagia, Hyperlipidemia, Hypertension, Mild mental slowing, Poor historian, Schizophrenia (HCC), and Seizures (HCC). Surgical Hx includes Esophagogastroduodenoscopy (egd) with propofol (01/28/2014) and Savory dilation (01/28/2014). When EMS arrived her saturations were in the 20s. Pt was was intubated on arrival (01/27/18) and chest x-ray has showed diffuse bilateral infiltrates. ARDSlikely 2/2 aspirationevent. Extubated 4/8. CXR (4/9) reveals No change to BILATERAL pulmonary opacities. CT Head (3/30) revealed no evidence of acute intracranial abnormality and mild chronic small-vessel white matter ischemic changes.       SLP Plan  Continue with current plan of care       Recommendations  Diet  recommendations: Dysphagia 1 (puree);Honey-thick liquid Liquids provided via: Cup Medication Administration: Crushed with puree Supervision: Staff to assist with self feeding;Full supervision/cueing for compensatory strategies Compensations: Slow rate;Small sips/bites Postural Changes and/or Swallow Maneuvers: Seated upright 90 degrees                Oral Care Recommendations: Oral care BID Follow up Recommendations: 24 hour supervision/assistance SLP Visit Diagnosis: Dysphagia, oropharyngeal phase (R13.12);Dysphagia, pharyngoesophageal phase (R13.14) Plan: Continue with current plan of care                       [name], Fredi Hurtado Willis 02/08/2018, 9:24 AM  Breck CoonsLisa Willis Lonell FaceLitaker M.Ed [employer]-SLP Pager [phone]

## 2018-02-08 NOTE — Consult Note (Addendum)
[facility] Consult   Reason for Consult:  Medication management  Referring Physician:  Dr. Tyrell Antonio  Patient Identification: Natasha Martin MRN:  [id number] Principal Diagnosis: Schizophrenia [facility]) Diagnosis:   Patient Active Problem List   Diagnosis Date Noted  . Acute respiratory failure ([place]) [J96.00] 01/27/2018  . ARDS (adult respiratory distress syndrome) ([place]) [J80] 01/27/2018  . Pain in joint, shoulder region [M25.519] 07/23/2015  . GERD (gastroesophageal reflux disease) [K21.9] 07/23/2015  . Hyponatremia secondary to SA IDH [E87.1] 02/18/2015  . Hypokalemia [E87.6] 02/18/2015  . Hypophosphatemia [E83.39] 02/18/2015  . Elevated d-dimer [R79.89] 02/18/2015  . Schizophrenia ([place]) [F20.9] 02/18/2015  . Atrial fibrillation ([place]) [I48.91]   . Atrial flutter with rapid ventricular response ([place]) [I48.92] 02/13/2015  . Ileus ([place]) [K56.7]   . Acute ischemic colitis ([place]) [K55.039]   . Atrial fibrillation with RVR ([place]) [I48.91] 02/11/2015  . GI bleed [K92.2] 02/11/2015  . Abdominal distention [R14.0] 02/11/2015  . Noninfectious gastroenteritis and colitis [K52.9] 02/11/2015  . Diabetes mellitus type 2 in nonobese ([place]) [E11.9] 02/11/2015  . Acute encephalopathy [G93.40] 02/11/2015  . Dysphagia, unspecified(787.20) [R13.10] 01/13/2014  . Rectal bleeding [K62.5] 01/13/2014    Total Time spent with patient: 1 hour  Subjective:   Natasha Martin is a 57 y.o. female patient admitted with acute respiratory failure secondary to ARDS.  HPI:   Per chart review, patient was admitted with acute respiratory failure requiring intubation secondary to ARDS likely due to aspiration. She has a history of schizophrenia. Home medications include Cogentin 2 mg BID, Valium 5 mg BID PRN, Trazodone 50 mg qhs PRN and Latuda 80 mg BID and Seroquel 400 q am and 600 mg qhs. PMP indicates regularly filled prescriptions for Valium that are prescribed by Dr. Stoney Bang. Seroquel 400 mg qhs was  restarted today.    [name] is minimally engaged in conversation and her voice is loud and hoarse.  She denies SI or HI.  Her sister, granddaughter and and one other family member were at bedside.  She was diagnosed with schizophrenia when she was a teenager.  She lived in a group home since the age of 42 and moved in with her sister 3 years ago.  She functions at a 58 y/o level.  She needs help cooking, dressing and showering.  She is able to feed her room 1 self. Her sister manages her medications.  She was doing well prior to hospitalization then she developed a cold that was passing through the family.  She has been compliant with her psychiatric medications.  She was started on Seroquel a year ago in 0 place of Haldol.  She experienced tics on Haldol. She was not sleeping well and had worsening AVH so Seroquel was started and Haldol was stopped.  She has chronic AVH even when she is doing well. Her medications are reportedly not sedating.  Her sister reports that she is normally energetic at baseline.  Past Psychiatric History: Schizophrenia and anxiety.   Risk to Self: Is patient at risk for suicide?: No Risk to Others:  1None. Denies HI.  Prior Inpatient Therapy:  She has a history of multiple hospitalizations and was last hospitalized when she was a teenager.  Prior Outpatient Therapy:  Prior medications include Haldol.   Past Medical History:  Past Medical History:  Diagnosis Date  . Anxiety   . Diabetes ([place])   . Dysphagia   . Hyperlipidemia   . Hypertension   . Mild mental slowing   .  Poor historian   . Schizophrenia ([place])   . Seizures ([place])    has not had a seizure since last year. Unknown orgin- no family available to come and patient does not know.    Past Surgical History:  Procedure Laterality Date  . BIOPSY  01/28/2014   Procedure: BIOPSY (Gastric);  Surgeon: Danie Binder, MD;  Location: [facility];  Service: Endoscopy;;  . COLONOSCOPY WITH PROPOFOL N/A 01/28/2014    DJS:HFWYOV mucosa in the terminal ileum/small internal and external hemorrhoids  . ESOPHAGOGASTRODUODENOSCOPY (EGD) WITH PROPOFOL N/A 01/28/2014   ZCH:YIFOYD esophagitis/stricture at the gastro junction/moderate erosive  . 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 POLYPECTOMY  01/28/2014   Procedure: POLYPECTOMY (Rectal);  Surgeon: Danie Binder, MD;  Location: [facility];  Service: Endoscopy;;  . SAVORY DILATION N/A 01/28/2014   Procedure: SAVORY DILATION  (12.23m to 150m;  Surgeon: SaDanie BinderMD;  Location: [facility];  Service: Endoscopy;  Laterality: N/A;   Family History:  Family History  Problem Relation Age of Onset  . Colon cancer Unknown        unknown   Family Psychiatric  History: Granddaughter-depression and anxiety.  Social History:  Social History   Substance and Sexual Activity  Alcohol Use No   Comment: history of ETOH abuse     Social History   Substance and Sexual Activity  Drug Use No    Social History   Socioeconomic History  . Marital status: Single    Spouse name: Not on file  . Number of children: Not on file  . Years of education: Not on file  . Highest education level: Not on file  Occupational History  . Not on file  Social Needs  . Financial resource strain: Not on file  . Food insecurity:    Worry: Not on file    Inability: Not on file  . Transportation needs:    Medical: Not on file    Non-medical: Not on file  Tobacco Use  . Smoking status: Never Smoker  . Smokeless tobacco: Never Used  Substance and Sexual Activity  . Alcohol use: No    Comment: history of ETOH abuse  . Drug use: No  . Sexual activity: Never    Birth control/protection: None  Lifestyle  . Physical activity:    Days per week: Not on file    Minutes per session: Not on file  . Stress: Not on file  Relationships  . Social connections:    Talks on phone: Not on file    Gets together: Not on file    Attends religious service:  Not on file    Active member of club or organization: Not on file    Attends meetings of clubs or organizations: Not on file    Relationship status: Not on file  Other Topics Concern  . Not on file  Social History Narrative  . Not on file   Additional Social History: She lives with her sister.  She does not use illicit substance or alcohol.    Allergies:   Allergies  Allergen Reactions  . Sulfa Antibiotics Rash    Labs:  Results for orders placed or performed during the hospital encounter of 01/27/18 (from the past 48 hour(s))  Glucose, capillary     Status: Abnormal   Collection Time: 02/06/18 11:51 AM  Result Value Ref Range   Glucose-Capillary 213 (H) 65 - 99 mg/dL  Comment 1 Capillary Specimen   Glucose, capillary     Status: Abnormal   Collection Time: 02/06/18  3:51 PM  Result Value Ref Range   Glucose-Capillary 183 (H) 65 - 99 mg/dL   Comment 1 Capillary Specimen    Comment 2 Notify RN   Heparin level (unfractionated)     Status: None   Collection Time: 02/06/18  4:00 PM  Result Value Ref Range   Heparin Unfractionated 0.70 0.30 - 0.70 IU/mL    Comment:        IF HEPARIN RESULTS ARE BELOW EXPECTED VALUES, AND PATIENT DOSAGE HAS BEEN CONFIRMED, SUGGEST FOLLOW UP TESTING OF ANTITHROMBIN III LEVELS. Performed at [facility], [place] [address] [place], [place] [address]   Glucose, capillary     Status: Abnormal   Collection Time: 02/06/18  8:33 PM  Result Value Ref Range   Glucose-Capillary 235 (H) 65 - 99 mg/dL   Comment 1 Notify RN   Glucose, capillary     Status: Abnormal   Collection Time: 02/06/18 10:55 PM  Result Value Ref Range   Glucose-Capillary 235 (H) 65 - 99 mg/dL   Comment 1 Notify RN    Comment 2 Document in Chart   Glucose, capillary     Status: Abnormal   Collection Time: 02/07/18 12:20 AM  Result Value Ref Range   Glucose-Capillary 200 (H) 65 - 99 mg/dL  Heparin level (unfractionated)     Status: Abnormal   Collection Time: 02/07/18   2:06 AM  Result Value Ref Range   Heparin Unfractionated 0.89 (H) 0.30 - 0.70 IU/mL    Comment:        IF HEPARIN RESULTS ARE BELOW EXPECTED VALUES, AND PATIENT DOSAGE HAS BEEN CONFIRMED, SUGGEST FOLLOW UP TESTING OF ANTITHROMBIN III LEVELS. Performed at [facility], [place] [address], [place], [place] [address]   Basic metabolic panel     Status: Abnormal   Collection Time: 02/07/18  2:06 AM  Result Value Ref Range   Sodium 143 135 - 145 mmol/L   Potassium 3.7 3.5 - 5.1 mmol/L   Chloride 100 (L) 101 - 111 mmol/L   CO2 29 22 - 32 mmol/L   Glucose, Bld 172 (H) 65 - 99 mg/dL   BUN 27 (H) 6 - 20 mg/dL   Creatinine, Ser 0.99 0.44 - 1.00 mg/dL   Calcium 9.1 8.9 - 10.3 mg/dL   GFR calc non Af Amer >60 >60 mL/min   GFR calc Af Amer >60 >60 mL/min    Comment: (NOTE) The eGFR has been calculated using the CKD EPI equation. This calculation has not been validated in all clinical situations. eGFR's persistently <60 mL/min signify possible Chronic Kidney Disease.    Anion gap 14 5 - 15    Comment: Performed at [facility] [address], [place], [place] [address]  CBC     Status: Abnormal   Collection Time: 02/07/18  2:06 AM  Result Value Ref Range   WBC 12.5 (H) 4.0 - 10.5 K/uL   RBC 3.20 (L) 3.87 - 5.11 MIL/uL   Hemoglobin 9.8 (L) 12.0 - 15.0 g/dL   HCT 31.3 (L) 36.0 - 46.0 %   MCV 97.8 78.0 - 100.0 fL   MCH 30.6 26.0 - 34.0 pg   MCHC 31.3 30.0 - 36.0 g/dL   RDW 15.6 (H) 11.5 - 15.5 %   Platelets 409 (H) 150 - 400 K/uL    Comment: Performed at [facility], [address] [place], [place] [address]  Glucose, capillary     Status: Abnormal   Collection Time: 02/07/18  3:36 AM  Result Value Ref Range   Glucose-Capillary 165 (H) 65 - 99 mg/dL   Comment 1 Notify RN    Comment 2 Document in Chart   Glucose, capillary     Status: Abnormal   Collection Time: 02/07/18  8:08 AM  Result Value Ref Range   Glucose-Capillary 188 (H) 65 - 99 mg/dL  Heparin level  (unfractionated)     Status: Abnormal   Collection Time: 02/07/18  9:02 AM  Result Value Ref Range   Heparin Unfractionated 0.75 (H) 0.30 - 0.70 IU/mL    Comment:        IF HEPARIN RESULTS ARE BELOW EXPECTED VALUES, AND PATIENT DOSAGE HAS BEEN CONFIRMED, SUGGEST FOLLOW UP TESTING OF ANTITHROMBIN III LEVELS. Performed at [facility], [place] [address] [place], [place] [address]   TSH     Status: None   Collection Time: 02/07/18 11:58 AM  Result Value Ref Range   TSH 0.822 0.350 - 4.500 uIU/mL    Comment: Performed by a 3rd Generation assay with a functional sensitivity of <=0.01 uIU/mL. Performed at [facility], [place] [address], [place], [place] [address]   Vitamin B12     Status: None   Collection Time: 02/07/18 11:58 AM  Result Value Ref Range   Vitamin B-12 731 180 - 914 pg/mL    Comment: (NOTE) This assay is not validated for testing neonatal or myeloproliferative syndrome specimens for Vitamin B12 levels. Performed at [facility], [place] [address], [place], [place] [address]   Protime-INR     Status: Abnormal   Collection Time: 02/07/18  3:38 PM  Result Value Ref Range   Prothrombin Time 15.3 (H) 11.4 - 15.2 seconds   INR 1.22     Comment: Performed at [facility] [address], [place], [place] [address]  Glucose, capillary     Status: Abnormal   Collection Time: 02/07/18  5:44 PM  Result Value Ref Range   Glucose-Capillary 361 (H) 65 - 99 mg/dL  Glucose, capillary     Status: Abnormal   Collection Time: 02/07/18  8:26 PM  Result Value Ref Range   Glucose-Capillary 354 (H) 65 - 99 mg/dL   Comment 1 Notify RN    Comment 2 Document in Chart   Glucose, capillary     Status: Abnormal   Collection Time: 02/07/18 11:57 PM  Result Value Ref Range   Glucose-Capillary 167 (H) 65 - 99 mg/dL   Comment 1 Notify RN    Comment 2 Document in Chart   Heparin level (unfractionated)     Status: None   Collection Time: 02/08/18  2:20 AM  Result Value  Ref Range   Heparin Unfractionated 0.56 0.30 - 0.70 IU/mL    Comment:        IF HEPARIN RESULTS ARE BELOW EXPECTED VALUES, AND PATIENT DOSAGE HAS BEEN CONFIRMED, SUGGEST FOLLOW UP TESTING OF ANTITHROMBIN III LEVELS. Performed at [facility], [place] [address], [place], [place] [address]   CBC     Status: Abnormal   Collection Time: 02/08/18  2:20 AM  Result Value Ref Range   WBC 11.9 (H) 4.0 - 10.5 K/uL   RBC 3.31 (L) 3.87 - 5.11 MIL/uL   Hemoglobin 10.4 (L) 12.0 - 15.0 g/dL   HCT 33.2 (L) 36.0 - 46.0 %  MCV 100.3 (H) 78.0 - 100.0 fL   MCH 31.4 26.0 - 34.0 pg   MCHC 31.3 30.0 - 36.0 g/dL   RDW 15.9 (H) 11.5 - 15.5 %   Platelets 433 (H) 150 - 400 K/uL    Comment: Performed at [facility] [address] [place], [place] [address]  Protime-INR     Status: None   Collection Time: 02/08/18  2:20 AM  Result Value Ref Range   Prothrombin Time 15.0 11.4 - 15.2 seconds   INR 1.19     Comment: Performed at [facility] [address] [place], [place] [address]  Glucose, capillary     Status: Abnormal   Collection Time: 02/08/18  4:17 AM  Result Value Ref Range   Glucose-Capillary 161 (H) 65 - 99 mg/dL   Comment 1 Notify RN    Comment 2 Document in Chart   Basic metabolic panel     Status: Abnormal   Collection Time: 02/08/18  7:23 AM  Result Value Ref Range   Sodium 149 (H) 135 - 145 mmol/L   Potassium 4.1 3.5 - 5.1 mmol/L   Chloride 112 (H) 101 - 111 mmol/L   CO2 26 22 - 32 mmol/L   Glucose, Bld 142 (H) 65 - 99 mg/dL   BUN 39 (H) 6 - 20 mg/dL   Creatinine, Ser 1.06 (H) 0.44 - 1.00 mg/dL   Calcium 8.8 (L) 8.9 - 10.3 mg/dL   GFR calc non Af Amer 58 (L) >60 mL/min   GFR calc Af Amer >60 >60 mL/min    Comment: (NOTE) The eGFR has been calculated using the CKD EPI equation. This calculation has not been validated in all clinical situations. eGFR's persistently <60 mL/min signify possible Chronic Kidney Disease.    Anion gap 11 5 - 15    Comment:  Performed at [facility] [address], [place], [place] [address]  Glucose, capillary     Status: Abnormal   Collection Time: 02/08/18  8:36 AM  Result Value Ref Range   Glucose-Capillary 179 (H) 65 - 99 mg/dL   Comment 1 Notify RN    Comment 2 Document in Chart     Current Facility-Administered Medications  Medication Dose Route Frequency Provider Last Rate Last Dose  . 0.45 % sodium chloride infusion   Intravenous Continuous Regalado, Belkys A, MD      . 0.9 %  sodium chloride infusion  250 mL Intravenous PRN Maryellen Pile, MD 10 mL/hr at 02/06/18 0952 250 mL at 02/06/18 0952  . acetaminophen (TYLENOL) tablet 650 mg  650 mg Oral Q6H PRN Regalado, Belkys A, MD   650 mg at 02/08/18 0949  . amiodarone (PACERONE) tablet 200 mg  200 mg Oral BID Regalado, Belkys A, MD   200 mg at 02/08/18 0950  . chlorhexidine (PERIDEX) 0.12 % solution 15 mL  15 mL Mouth Rinse BID Maryellen Pile, MD   15 mL at 02/08/18 0948  . diazepam (VALIUM) tablet 2 mg  2 mg Oral Q12H PRN Regalado, Belkys A, MD      . heparin ADULT infusion 100 units/mL (25000 units/254m sodium chloride 0.45%)  1,500 Units/hr Intravenous Continuous MKris Mouton RPH 15 mL/hr at 02/07/18 2227 1,500 Units/hr at 02/07/18 2227  . hydrALAZINE (APRESOLINE) injection 10 mg  10 mg Intravenous Q8H PRN Regalado, Belkys A, MD      . insulin aspart (novoLOG) injection 0-15 Units  0-15 Units Subcutaneous TID WC Regalado, Belkys  A, MD      . insulin aspart (novoLOG) injection 0-5 Units  0-5 Units Subcutaneous QHS Regalado, Belkys A, MD      . insulin glargine (LANTUS) injection 30 Units  30 Units Subcutaneous Daily Maryellen Pile, MD   30 Units at 02/08/18 [phone]  . ipratropium-albuterol (DUONEB) 0.5-2.5 (3) MG/3ML nebulizer solution 3 mL  3 mL Nebulization Q4H PRN Maryellen Pile, MD      . levETIRAcetam (KEPPRA) IVPB 1000 mg/100 mL premix  1,000 mg Intravenous Q12H Maryellen Pile, MD   Stopped at 02/08/18 0150  . magnesium hydroxide  (MILK OF MAGNESIA) suspension 5 mL  5 mL Per Tube Daily PRN Maryellen Pile, MD      . MEDLINE mouth rinse  15 mL Mouth Rinse q12n4p Maryellen Pile, MD   15 mL at 02/07/18 1700  . metoprolol succinate (TOPROL-XL) 24 hr tablet 100 mg  100 mg Oral BID AC Regalado, Belkys A, MD      . mupirocin cream (BACTROBAN) 2 %   Topical Daily Maryellen Pile, MD   1 application at 06/01/22 [phone]  . pantoprazole (PROTONIX) injection 40 mg  40 mg Intravenous Q24H Maryellen Pile, MD   40 mg at 02/07/18 1411  . potassium chloride 20 MEQ/15ML (10%) solution 40 mEq  40 mEq Oral Daily Maryellen Pile, MD   40 mEq at 02/08/18 0948  . QUEtiapine (SEROQUEL) tablet 400 mg  400 mg Oral QHS Regalado, Belkys A, MD      . Racepinephrine HCl 2.25 % nebulizer solution 0.5 mL  0.5 mL Nebulization Q4H PRN Maryellen Pile, MD   0.5 mL at 02/06/18 0953  . RESOURCE THICKENUP CLEAR   Oral PRN Regalado, Belkys A, MD      . sennosides (SENOKOT) 8.8 MG/5ML syrup 5 mL  5 mL Oral Daily Maryellen Pile, MD   5 mL at 02/08/18 0949  . sodium chloride flush (NS) 0.9 % injection 10-40 mL  10-40 mL Intracatheter PRN Maryellen Pile, MD   10 mL at 02/03/18 0914  . Warfarin - Pharmacist Dosing Inpatient   Does not apply q1800 Romona Curls, [facility]        Musculoskeletal: Strength & Muscle Tone: decreased Gait & Station: UTA since patient is sitting in a chair. Patient leans: N/A  Psychiatric Specialty Exam: Physical Exam  Nursing note and vitals reviewed. Constitutional: She appears well-developed and well-nourished.  HENT:  Head: Normocephalic and atraumatic.  Neck: Normal range of motion.  Respiratory: Effort normal.  Musculoskeletal: Normal range of motion.  Neurological: She is alert.  Skin: No rash noted.  Psychiatric: Thought content normal. Her affect is blunt. Her speech is delayed. She is slowed. Cognition and memory are impaired.    Review of Systems  Psychiatric/Behavioral: Negative for substance abuse and suicidal ideas.   All other systems reviewed and are negative.   Blood pressure (!) 171/64, pulse 91, temperature (!) 103 F (39.4 C), temperature source Oral, resp. rate (!) 31, height 5' 2"  (1.575 m), weight 69.5 kg (153 lb 3.5 oz), last menstrual period 01/21/2014, SpO2 92 %.Body mass index is 28.02 kg/m.  General Appearance: Fairly Groomed, middle aged, Caucasian female, wearing a hospital gown with facial tics and lying in bed. NAD.   Eye Contact:  Fair  Speech:  Slow  Volume:  Decreased  Mood:  Did not state  Affect:  Constricted  Thought Process:  Linear and Descriptions of Associations: Intact  Orientation:  Other:  UTA since patient is minimally responsive.  Thought Content:  Logical  Suicidal Thoughts:  No  Homicidal Thoughts:  No  Memory:  Immediate;   UTA since patient is minimally responsive.  Recent;   UTA since patient is minimally responsive.  Remote;   UTA since patient is minimally responsive.   Judgement:  Impaired  Insight:  Lacking  Psychomotor Activity:  Decreased  Concentration:  Concentration: Fair and Attention Span: Fair  Recall:  UTA since patient is minimally responsive.   Fund of Knowledge:  UTA since patient is minimally responsive.   Language:  Fair  Akathisia:  No  Handed:  Right  AIMS (if indicated):   N/A  Assets:  Housing Social Support  ADL's:  Impaired  Cognition:  Impaired  Sleep:   N/A    Assessment: JOHANNY SEGERS is a 57 y.o. female who was admitted with acute respiratory failure secondary to ARDS. Psychiatry was consulted for medication management for schizophrenia. On interview, patient is minimally responsive likely secondary to lethargy. She is not at her baseline according to family. She functions at a 57 y/o level but is usually interactive and energetic. She has chronic AVH. Recommend restarting psychotropic medications at this time and increasing to home dose tomorrow if tolerates medications. She has facial tics which are likely secondary to  withdrawal dyskinesia due to her antipsychotics being abruptly stopped.     Treatment Plan Summary: -Continue home Seroquel 600 mg qhs for schizophrenia. -Restart Latuda 80 mg BID for schizophrenia. -Restart Seroquel 400 mg daily for schizophrenia on 4/12 if patient is not overly sedated and tolerates restarting the above medications. -Restart Cogentin 1 mg BID for EPS. -Hold Trazodone at this time unless patient has problems with sleeping.  -EKG reviewed and QTc 456 on 4/8. Please closely monitor when starting or increasing QTc prolonging agents. -Psychiatry will sign off on patient at this time. Please consult psychiatry again as needed.     Disposition: No evidence of imminent risk to self or others at present.   Patient does not meet criteria for psychiatric inpatient admission.  Faythe Dingwall, DO 02/08/2018 10:07 AM

## 2018-02-08 NOTE — Progress Notes (Signed)
Physical Therapy Treatment Patient Details Name: Natasha Martin MRN: [id number] DOB: 1961-02-04 Today's Date: 02/08/2018    History of Present Illness Pt is a 57 y.o. female admitted 01/27/18 after being found unresponsive; EMS found saturations in 20s. Pt was intubated upon arrival. CT shows no acute intracranial abnormality; mild chronic small-vessel ischemic changes. CXR showed diffuse bilateral infiltrates. Worked up for ARDS likely secondary to aspiration event. Extubated 4/8. PMH includes mild mental slowing, seizures, schizophrenia, HTN, DM, anxiety.     PT Comments    Pt seen with OT this session to further assess cognitive status and mobility this session. Pt required maxA+2 to perform lateral scoot transfer to drop-arm recliner. Pt inconsistently following commands and participating throughout session. Delayed processing; required max encouragement to vocalize, although denied sore throat. Difficult to determine what is baseline cognition versus fever/medication-related/hypoxia/new onset deficits. Will follow acutely.    Follow Up Recommendations  SNF;Supervision/Assistance - 24 hour     Equipment Recommendations  (TBD next venue)    Recommendations for Other Services       Precautions / Restrictions Precautions Precautions: Fall Restrictions Weight Bearing Restrictions: No    Mobility  Bed Mobility Overal bed mobility: Needs Assistance Bed Mobility: Rolling;Supine to Sit Rolling: Max assist;+2 for safety/equipment   Supine to sit: Max assist;+2 for physical assistance;+2 for safety/equipment;HOB elevated     General bed mobility comments: Pt inconsistent with participation/assistance; Pt required increased time and single step multimodal cues for all aspects. +2 assist throughout for trunk elevation and use of bed pad to bring hips EOB  Transfers Overall transfer level: Needs assistance Equipment used: 2 person hand held assist Transfers: Lateral/Scoot Transfers(with  bed pad)          Lateral/Scoot Transfers: Max assist;+2 physical assistance;+2 safety/equipment General transfer comment: drop arm recliner used, and bed pad to assist, Pt assisting by leaning forward with cues - otherwise total A for lateral transfer  Ambulation/Gait                 Stairs            Wheelchair Mobility    Modified Rankin (Stroke Patients Only)       Balance Overall balance assessment: Needs assistance Sitting-balance support: No upper extremity supported;Bilateral upper extremity supported Sitting balance-Leahy Scale: Poor Sitting balance - Comments: Pt fatigued quickly in unsupported sitting position with slight left lateral lean this session Postural control: Left lateral lean                                  Cognition Arousal/Alertness: Awake/alert Behavior During Therapy: Flat affect Overall Cognitive Status: Impaired/Different from baseline Area of Impairment: Attention;Following commands;Safety/judgement;Awareness;Problem solving                   Current Attention Level: Sustained   Following Commands: Follows one step commands inconsistently;Follows one step commands with increased time Safety/Judgement: Decreased awareness of deficits Awareness: Intellectual Problem Solving: Slow processing;Decreased initiation;Difficulty sequencing;Requires verbal cues;Requires tactile cues General Comments: Pt inconsistent throughout session with direction following (no patterns noticed), Pt required max cues for verbalization throughout session, processing seemed delayed throughout session. Cognition remains inconsistent and question potential for hypoxic brain injury as we continue to decifer baseline vs coming off extubation/fever/not being on psych meds. Per chart Pt was very talkative, and independent (with the exception of cooking) PTA. Will continue to monitor      Exercises  General Comments General comments  (skin integrity, edema, etc.): Pt with 103 fever today and increased BP; received BP meds and tylenol at beginning of session      Pertinent Vitals/Pain Pain Assessment: No/denies pain Faces Pain Scale: Hurts little more Pain Intervention(s): Monitored during session;Repositioned    Home Living Family/patient expects to be discharged to:: Skilled nursing facility Living Arrangements: Other relatives             Additional Comments: Lives with sister and sister's grandkids    Prior Function        Comments: Pt poor historian. Sister reports pt indep with all mobility and most ADLs. Sister cooks for pt   PT Goals (current goals can now be found in the care plan section) Acute Rehab PT Goals Patient Stated Goal: Get stronger PT Goal Formulation: With patient/family Time For Goal Achievement: 02/21/18 Potential to Achieve Goals: Fair Progress towards PT goals: Progressing toward goals    Frequency    Min 2X/week      PT Plan Current plan remains appropriate    Co-evaluation PT/OT/SLP Co-Evaluation/Treatment: Yes Reason for Co-Treatment: Complexity of the patient's impairments (multi-system involvement);Necessary to address cognition/behavior during functional activity;For patient/therapist safety;To address functional/ADL transfers PT goals addressed during session: Mobility/safety with mobility;Balance OT goals addressed during session: ADL's and self-care;Strengthening/ROM      AM-PAC PT "6 Clicks" Daily Activity  Outcome Measure  Difficulty turning over in bed (including adjusting bedclothes, sheets and blankets)?: Unable Difficulty moving from lying on back to sitting on the side of the bed? : Unable Difficulty sitting down on and standing up from a chair with arms (e.g., wheelchair, bedside commode, etc,.)?: Unable Help needed moving to and from a bed to chair (including a wheelchair)?: A Lot Help needed walking in hospital room?: Total Help needed climbing  3-5 steps with a railing? : Total 6 Click Score: 7    End of Session Equipment Utilized During Treatment: Oxygen Activity Tolerance: Patient limited by fatigue Patient left: in chair;with call bell/phone within reach Nurse Communication: Mobility status PT Visit Diagnosis: Other abnormalities of gait and mobility (R26.89);Muscle weakness (generalized) (M62.81)     Time: [id number] PT Time Calculation (min) (ACUTE ONLY): 34 min  Charges:  $Therapeutic Activity: 8-22 mins                    G Codes:      Ina HomesJaclyn Sela Falk, PT, DPT Acute Rehab Services  Pager: [phone]  Malachy ChamberJaclyn L Ozan Maclay 02/08/2018, 11:57 AM

## 2018-02-09 ENCOUNTER — Inpatient Hospital Stay (HOSPITAL_COMMUNITY): Payer: Medicare Other

## 2018-02-09 DIAGNOSIS — F209 Schizophrenia, unspecified: Secondary | ICD-10-CM

## 2018-02-09 DIAGNOSIS — R29898 Other symptoms and signs involving the musculoskeletal system: Secondary | ICD-10-CM

## 2018-02-09 LAB — BASIC METABOLIC PANEL
Anion gap: 9 (ref 5–15)
BUN: 25 mg/dL — AB (ref 6–20)
CHLORIDE: 115 mmol/L — AB (ref 101–111)
CO2: 26 mmol/L (ref 22–32)
Calcium: 7.9 mg/dL — ABNORMAL LOW (ref 8.9–10.3)
Creatinine, Ser: 0.96 mg/dL (ref 0.44–1.00)
GFR calc Af Amer: >60 mL/min (ref >60)
GFR calc non Af Amer: >60 mL/min (ref >60)
Glucose, Bld: 153 mg/dL — ABNORMAL HIGH (ref 65–99)
POTASSIUM: 3.1 mmol/L — AB (ref 3.5–5.1)
Sodium: 150 mmol/L — ABNORMAL HIGH (ref 135–145)

## 2018-02-09 LAB — GLUCOSE, CAPILLARY
GLUCOSE-CAPILLARY: 162 mg/dL — AB (ref 65–99)
Glucose-Capillary: 191 mg/dL — ABNORMAL HIGH (ref 65–99)
Glucose-Capillary: 145 mg/dL — ABNORMAL HIGH (ref 65–99)
Glucose-Capillary: 236 mg/dL — ABNORMAL HIGH (ref 65–99)

## 2018-02-09 LAB — HEPARIN LEVEL (UNFRACTIONATED)
HEPARIN UNFRACTIONATED: 0.86 [IU]/mL — AB (ref 0.30–0.70)
HEPARIN UNFRACTIONATED: 0.38 [IU]/mL (ref 0.30–0.70)
Heparin Unfractionated: 0.27 IU/mL — ABNORMAL LOW (ref 0.30–0.70)

## 2018-02-09 LAB — CBC
HCT: 29.7 % — ABNORMAL LOW (ref 36.0–46.0)
Hemoglobin: 9.1 g/dL — ABNORMAL LOW (ref 12.0–15.0)
MCH: 31.1 pg (ref 26.0–34.0)
MCHC: 30.6 g/dL (ref 30.0–36.0)
MCV: 101.4 fL — ABNORMAL HIGH (ref 78.0–100.0)
PLATELETS: 282 10*3/uL (ref 150–400)
RBC: 2.93 MIL/uL — ABNORMAL LOW (ref 3.87–5.11)
RDW: 15.8 % — AB (ref 11.5–15.5)
WBC: 5.9 10*3/uL (ref 4.0–10.5)

## 2018-02-09 LAB — HEPATIC FUNCTION PANEL
ALK PHOS: 79 U/L (ref 38–126)
ALT: 22 U/L (ref 14–54)
AST: 19 U/L (ref 15–41)
Albumin: 2.4 g/dL — ABNORMAL LOW (ref 3.5–5.0)
BILIRUBIN DIRECT: 0.2 mg/dL (ref 0.1–0.5)
BILIRUBIN INDIRECT: 0.4 mg/dL (ref 0.3–0.9)
Total Bilirubin: 0.6 mg/dL (ref 0.3–1.2)
Total Protein: 5.6 g/dL — ABNORMAL LOW (ref 6.5–8.1)

## 2018-02-09 LAB — PROTIME-INR
INR: 1.5
PROTHROMBIN TIME: 18 s — AB (ref 11.4–15.2)

## 2018-02-09 LAB — URINE CULTURE: Culture: NO GROWTH

## 2018-02-09 MED ORDER — DEXTROSE 5 % IV SOLN
INTRAVENOUS | Status: DC
  Administered 2018-02-09: 07:00:00 via INTRAVENOUS
  Administered 2018-02-10: 100 mL/h via INTRAVENOUS

## 2018-02-09 MED ORDER — PANTOPRAZOLE SODIUM 40 MG PO TBEC
40.0000 mg | DELAYED_RELEASE_TABLET | Freq: Every day | ORAL | Status: DC
  Administered 2018-02-09 – 2018-02-12 (×4): 40 mg via ORAL
  Filled 2018-02-09 (×4): qty 1

## 2018-02-09 MED ORDER — WARFARIN SODIUM 2 MG PO TABS
2.0000 mg | ORAL_TABLET | Freq: Once | ORAL | Status: AC
  Administered 2018-02-09: 2 mg via ORAL
  Filled 2018-02-09: qty 1

## 2018-02-09 NOTE — Clinical Social Work Note (Signed)
Clinical Social Work Assessment  Patient Details  Name: Natasha Martin MRN: [id number] Date of Birth: Nov 08, 1960  Date of referral:  02/09/18               Reason for consult:  Facility Placement, Discharge Planning                Permission sought to share information with:  Facility Medical sales representativeContact Representative, Family Supports Permission granted to share information::  Yes, Verbal Permission Granted  Name::     Reuel BoomVirginia Cochran  Agency[facility]  [facility]  Relationship::  Sister  Contact Information:  [phone]  Housing/Transportation Living arrangements for the past 2 months:  Single Family Home Source of Information:  Medical Team, Siblings Patient Interpreter Needed:  None Criminal Activity/Legal Involvement Pertinent to Current Situation/Hospitalization:  No - Comment as needed Significant Relationships:  Siblings, Other Family Members, Parents Lives with:  Siblings, Relatives Do you feel safe going back to the place where you live?  Yes Need for family participation in patient care:  Yes (Comment)  Care giving concerns:  PT recommending SNF placement once medically stable for discharge.   Social Worker assessment / plan:  Patient oriented to self only. No supports at bedside. CSW called patient's sister, introduced role, and explained that PT recommendations would be discussed. Patient's sister agreeable to [facility] Facility already knows the patient as she has been there before and patient's sister has already been to the facility to discuss admission for rehab. PASARR under manual review. Patient cannot go to SNF until PASARR obtained. No further concerns. CSW encouraged patient's sister to contact CSW as needed. CSW will continue to follow patient and her sister for support and facilitate discharge to SNF once medically stable.  Employment status:  Disabled (Comment on whether or not currently receiving Disability) Insurance information:   Medicare PT Recommendations:  Skilled Nursing Facility Information / Referral to community resources:  Skilled Nursing Facility  Patient/Family's Response to care:  Patient not fully oriented. Patient's sister agreeable to SNF placement. Patient's family supportive and involved in patient's care. Patient's sister appreciated social work intervention.  Patient/Family's Understanding of and Emotional Response to Diagnosis, Current Treatment, and Prognosis:  Patient not fully oriented. Patient's sister has a good understanding of the reason for admission and her need for rehab prior to returning home. Patient's sister appears happy with hospital care.  Emotional Assessment Appearance:  Appears stated age Attitude/Demeanor/Rapport:  Unable to Assess Affect (typically observed):  Unable to Assess Orientation:  Oriented to Self Alcohol / Substance use:  Never Used Psych involvement (Current and /or in the community):  No (Comment)  Discharge Needs  Concerns to be addressed:  Care Coordination Readmission within the last 30 days:  No Current discharge risk:  Cognitively Impaired, Dependent with Mobility Barriers to Discharge:  Awaiting State Approval ([place]), Continued Medical Work up   Margarito LinerSarah C Amarea Macdowell, LCSW 02/09/2018, 3:39 PM

## 2018-02-09 NOTE — NC FL2 (Signed)
[place] LEVEL OF CARE SCREENING TOOL     IDENTIFICATION  Patient Name: Natasha Martin Birthdate: 1961-01-27 Sex: female Admission Date (Current Location): 01/27/2018  [facility] and [place] Number:  [employer] and Address:  [facility] [place], [place] [address] [place], [place] [address]      Provider Number: [id number]  Attending Physician Name and Address:  Elmarie Shiley, MD  Relative Name and Phone Number:       Current Level of Care: Hospital Recommended Level of Care: [place] Prior Approval Number:    Date Approved/Denied:   PASRR Number: Manual review  Discharge Plan: SNF    Current Diagnoses: Patient Active Problem List   Diagnosis Date Noted  . Acute respiratory failure ([place]) 01/27/2018  . ARDS (adult respiratory distress syndrome) ([place]) 01/27/2018  . Pain in joint, shoulder region 07/23/2015  . GERD (gastroesophageal reflux disease) 07/23/2015  . Hyponatremia secondary to SA IDH 02/18/2015  . Hypokalemia 02/18/2015  . Hypophosphatemia 02/18/2015  . Elevated d-dimer 02/18/2015  . Schizophrenia ([place]) 02/18/2015  . Atrial fibrillation ([place])   . Atrial flutter with rapid ventricular response ([place]) 02/13/2015  . Ileus ([place])   . Acute ischemic colitis ([place])   . Atrial fibrillation with RVR ([place]) 02/11/2015  . GI bleed 02/11/2015  . Abdominal distention 02/11/2015  . Noninfectious gastroenteritis and colitis 02/11/2015  . Diabetes mellitus type 2 in nonobese ([place]) 02/11/2015  . Acute encephalopathy 02/11/2015  . Dysphagia, unspecified(787.20) 01/13/2014  . Rectal bleeding 01/13/2014    Orientation RESPIRATION BLADDER Height & Weight     Self  O2(Nasal Canula 1 L.) Continent, Indwelling catheter Weight: 159 lb 9.8 oz (72.4 kg) Height:  5' 2"  (157.5 cm)  BEHAVIORAL SYMPTOMS/MOOD NEUROLOGICAL BOWEL NUTRITION STATUS  Other (Comment)(Calm, flat affect.) (None) Incontinent Diet(DYS 1. Fluid honey  thick. Carb modified.)  AMBULATORY STATUS COMMUNICATION OF NEEDS Skin   Extensive Assist Verbally Skin abrasions, Bruising, Other (Comment)(MASD, Rash. Dry scabbed areas on medial coccyx: Foam every 3 days.)                       Personal Care Assistance Level of Assistance  Bathing, Feeding, Dressing Bathing Assistance: Maximum assistance Feeding assistance: Maximum assistance Dressing Assistance: Maximum assistance     Functional Limitations Info  Sight, Hearing, Speech Sight Info: Adequate Hearing Info: Adequate Speech Info: Adequate    SPECIAL CARE FACTORS FREQUENCY  PT (By licensed PT), OT (By licensed OT), Speech therapy     PT Frequency: 5 x week OT Frequency: 5 x week     Speech Therapy Frequency: 5 x week      Contractures Contractures Info: Not present    Additional Factors Info  Code Status, Allergies, Psychotropic Code Status Info: Full Allergies Info: Sulfa Antibiotics Psychotropic Info: Schizophrenia: Cogentin 1 mg PO BID, Latuda 80 mg PO BID with meals, Seroquel 600 mg PO QHS.         Current Medications (02/09/2018):  This is the current hospital active medication list Current Facility-Administered Medications  Medication Dose Route Frequency Provider Last Rate Last Dose  . 0.9 %  sodium chloride infusion  250 mL Intravenous PRN Maryellen Pile, MD   Stopped at 02/08/18 1142  . acetaminophen (TYLENOL) tablet 650 mg  650 mg Oral Q6H PRN Regalado, Belkys A, MD   650 mg at 02/08/18 2311  . amiodarone (PACERONE) tablet 200 mg  200 mg Oral BID Regalado, Cassie Freer, MD  200 mg at 02/09/18 0941  . Ampicillin-Sulbactam (UNASYN) 3 g in sodium chloride 0.9 % 100 mL IVPB  3 g Intravenous Q6H Pierce, Dwayne A, RPH 200 mL/hr at 02/09/18 1503 3 g at 02/09/18 1503  . benztropine (COGENTIN) tablet 1 mg  1 mg Oral BID Regalado, Belkys A, MD   1 mg at 02/09/18 1030  . chlorhexidine (PERIDEX) 0.12 % solution 15 mL  15 mL Mouth Rinse BID Maryellen Pile, MD   15 mL at  02/09/18 1030  . dextrose 5 % solution   Intravenous Continuous Regalado, Belkys A, MD   Stopped at 02/09/18 1510  . diazepam (VALIUM) tablet 2 mg  2 mg Oral Q12H PRN Regalado, Belkys A, MD      . heparin ADULT infusion 100 units/mL (25000 units/253m sodium chloride 0.45%)  1,300 Units/hr Intravenous Continuous LErenest Blank RPH 13 mL/hr at 02/09/18 1157 1,300 Units/hr at 02/09/18 1157  . hydrALAZINE (APRESOLINE) injection 10 mg  10 mg Intravenous Q8H PRN Regalado, Belkys A, MD      . insulin aspart (novoLOG) injection 0-15 Units  0-15 Units Subcutaneous TID WC Regalado, Belkys A, MD   2 Units at 02/09/18 1257  . insulin aspart (novoLOG) injection 0-5 Units  0-5 Units Subcutaneous QHS Regalado, Belkys A, MD      . insulin glargine (LANTUS) injection 30 Units  30 Units Subcutaneous Daily BMaryellen Pile MD   30 Units at 02/09/18 1035  . ipratropium-albuterol (DUONEB) 0.5-2.5 (3) MG/3ML nebulizer solution 3 mL  3 mL Nebulization Q4H PRN BMaryellen Pile MD      . levETIRAcetam (KEPPRA) IVPB 1000 mg/100 mL premix  1,000 mg Intravenous Q12H BMaryellen Pile MD 400 mL/hr at 02/09/18 1504 1,000 mg at 02/09/18 1504  . lurasidone (LATUDA) tablet 80 mg  80 mg Oral BID WC Regalado, Belkys A, MD   80 mg at 02/09/18 1030  . magnesium hydroxide (MILK OF MAGNESIA) suspension 5 mL  5 mL Per Tube Daily PRN BMaryellen Pile MD      . MEDLINE mouth rinse  15 mL Mouth Rinse q12n4p BMaryellen Pile MD   15 mL at 02/09/18 1259  . metoprolol succinate (TOPROL-XL) 24 hr tablet 100 mg  100 mg Oral BID AC Regalado, Belkys A, MD   100 mg at 02/09/18 1030  . mupirocin cream (BACTROBAN) 2 %   Topical Daily BMaryellen Pile MD   1 application at [id number]  . pantoprazole (PROTONIX) EC tablet 40 mg  40 mg Oral QHS Regalado, Belkys A, MD      . potassium chloride 20 MEQ/15ML (10%) solution 40 mEq  40 mEq Oral Daily BMaryellen Pile MD   40 mEq at 02/09/18 1030  . QUEtiapine (SEROQUEL) tablet 600 mg  600 mg Oral QHS  Regalado, Belkys A, MD   600 mg at 02/08/18 2133  . Racepinephrine HCl 2.25 % nebulizer solution 0.5 mL  0.5 mL Nebulization Q4H PRN BMaryellen Pile MD   0.5 mL at 02/06/18 0953  . RESOURCE THICKENUP CLEAR   Oral PRN Regalado, Belkys A, MD      . sodium chloride flush (NS) 0.9 % injection 10-40 mL  10-40 mL Intracatheter PRN BMaryellen Pile MD   10 mL at 02/03/18 0914  . warfarin (COUMADIN) tablet 2 mg  2 mg Oral ONCE-1800 BRomona Curls RNorth [place]     . Warfarin - Pharmacist Dosing Inpatient   Does not apply q1800 [facility]omona Curls [facility]  1 each at 02/08/18 13154  Discharge Medications: Please see discharge summary for a list of discharge medications.  Relevant Imaging Results:  Relevant Lab Results:   Additional Information SS#: [id number]  Candie Chroman, LCSW

## 2018-02-09 NOTE — Care Management Note (Signed)
Case Management Note  Patient Details  Name: Natasha Martin MRN: [id number] Date of Birth: 08-20-1961  Subjective/Objective:    Pt admitted with SOB                Action/Plan:  PTA from home.  Pt remains on ventilator.     Expected Discharge Date:                  Expected Discharge Plan:  Skilled Nursing Facility  In-House Referral:  Clinical Social Work  Discharge planning Services  CM Consult  Post Acute Care Choice:    Choice offered to:     DME Arranged:    DME Agency:     HH Arranged:    HH Agency:     Status of Service:     If discussed at [employer] Meetings, dates discussed:    Additional Comments: 02/09/2018  SNF recommended - CSW following  02/06/18 Pt now extubated from ETT tube and on Pierz , IV heparin, IV decadron and IV keppra.   CM discussed previous LTACH referral with attending on today 4/10  (original referral ordered by Bedside nurse, referral discussed with attending at the time and pt was not considered appropriate due to ETT ventilation)  - [facility].  PT eval pending   02/05/18 Pt extubated this am however requiring Aerosol mask with 10 HFNC.  Pt remains unstable for PT eval - CM will continue to follow for discharge needs Cherylann ParrClaxton, [name], RN 02/09/2018, 9:40 AM

## 2018-02-09 NOTE — Code Documentation (Signed)
57 yo female in 2C14 reported to have uneven pupils when Attending MD performed morning exam. Upon arrival of the stroke team, pt had 5 mm left eye and 3 mm right eye with some right sided weakness, and expressive aphasia with dysarthria. Slight facial droop noted on the right face. Pt was taken to CT. tPA contraindicated due to unclear LKW and pt on heparin.

## 2018-02-09 NOTE — Progress Notes (Signed)
ANTICOAGULATION CONSULT NOTE - Follow Up Consult  Pharmacy Consult for heparin > warfarin Indication: atrial fibrillation  Allergies  Allergen Reactions  . Sulfa Antibiotics Rash    Patient Measurements: Height: 5\' 2"  (157.5 cm) Weight: 159 lb 9.8 oz (72.4 kg) IBW/kg (Calculated) : 50.1 Heparin Dosing Weight: 68 kg  Vital Signs: Temp: 97.9 F (36.6 C) (04/12 1214) Temp Source: Oral (04/12 1214) BP: 145/64 (04/12 1214) Pulse Rate: 71 (04/12 0922)  Labs: Recent Labs    02/07/18 0206  02/07/18 1538 02/08/18 0220 02/08/18 0723 02/09/18 0351 02/09/18 1315  HGB 9.8*  --   --  10.4*  --  9.1*  --   HCT 31.3*  --   --  33.2*  --  29.7*  --   PLT 409*  --   --  433*  --  282  --   LABPROT  --   --  15.3* 15.0  --  18.0*  --   INR  --   --  1.22 1.19  --  1.50  --   HEPARINUNFRC 0.89*   < >  --  0.56  --  0.86* 0.27*  CREATININE 0.99  --   --   --  1.06* 0.96  --    < > = values in this interval not displayed.    Estimated Creatinine Clearance: 61.7 mL/min (by C-G formula based on SCr of 0.96 mg/dL).  Assessment: [age] yoF with a history of AFib on warfarin PTA found unresponsive at home. Pharmacy consulted to start IV heparin while holding oral AC (previously failed swallow evaluation this admit). Warfarin now resumed 4/9.   INR up to 1.5 this AM after 2 boosted warfarin doses resumed starting 4/9. Will resume home warfarin dose today. Hg trend down to 9.1, plt wnl. No bleed documented. Noted resumed amiodarone from PTA 4/11. Heparin off briefly today with patient having RUE weakness and Neuro consulted. CT head negative for bleed. Spoke with Dr. Sunnie Nielsenegalado - ok to resume anticoagulation.  Heparin level slightly low at 0.27 after rate decrease this AM - per RN, heparin was off ~1.5-2 hrs and resumed around 1100. Will recheck level from time of resumption. RN notes some bleeding from IV site this AM but has resolved.  Home warfarin dose: 2mg  daily except 3mg  on Sat and Sun  Goal  of Therapy:  Heparin level 0.3-0.7 units/ml  INR 2-3 Monitor platelets by anticoagulation protocol: Yes   Plan:  -Continue heparin IV at 1300 units/hr for now -Coumadin 2mg  PO x 1 dose tonight -6h heparin level from time of resumption -Monitor daily heparin level/INR/CBC, s/sx bleeding  Babs BertinHaley Jiraiya Mcewan, PharmD, BCPS Clinical Pharmacist 02/09/2018 3:11 PM

## 2018-02-09 NOTE — Clinical Social Work Note (Signed)
Requested documents uploaded into [place] [facility] for PASARR review.  Amybeth Sieg, CSW [phone]  

## 2018-02-09 NOTE — Care Management Important Message (Signed)
Important Message  Patient Details  Name: Natasha Martin MRN: [id number] Date of Birth: 01-10-1961   Medicare Important Message Given:  Yes    Eavan Gonterman P Addalyn Speedy 02/09/2018, 2:03 PM

## 2018-02-09 NOTE — Progress Notes (Signed)
ANTICOAGULATION CONSULT NOTE - Follow Up Consult  Pharmacy Consult for heparin > warfarin Indication: atrial fibrillation  Allergies  Allergen Reactions  . Sulfa Antibiotics Rash    Patient Measurements: Height: 5\' 2"  (157.5 cm) Weight: 159 lb 9.8 oz (72.4 kg) IBW/kg (Calculated) : 50.1 Heparin Dosing Weight: 68 kg  Vital Signs: Temp: 97.9 F (36.6 C) (04/12 1214) Temp Source: Oral (04/12 1214) BP: 145/64 (04/12 1214) Pulse Rate: 71 (04/12 0922)  Labs: Recent Labs    02/07/18 0206  02/07/18 1538 02/08/18 0220 02/08/18 0723 02/09/18 0351 02/09/18 1315  HGB 9.8*  --   --  10.4*  --  9.1*  --   HCT 31.3*  --   --  33.2*  --  29.7*  --   PLT 409*  --   --  433*  --  282  --   LABPROT  --   --  15.3* 15.0  --  18.0*  --   INR  --   --  1.22 1.19  --  1.50  --   HEPARINUNFRC 0.89*   < >  --  0.56  --  0.86* 0.27*  CREATININE 0.99  --   --   --  1.06* 0.96  --    < > = values in this interval not displayed.    Estimated Creatinine Clearance: 61.7 mL/min (by C-G formula based on SCr of 0.96 mg/dL).  Assessment: [age] yoF with a history of AFib on warfarin PTA found unresponsive at home. Pharmacy consulted to start IV heparin while holding oral AC (previously failed swallow evaluation this admit). Warfarin now resumed 4/9.   INR up to 1.5 this AM after 2 boosted warfarin doses resumed starting 4/9. Will resume home warfarin dose today. Hg trend down to 9.1, plt wnl. No bleed documented. Noted resumed amiodarone from PTA 4/11. Heparin off briefly today with patient having RUE weakness and Neuro consulted. CT head negative for bleed. Spoke with Dr. Sunnie Nielsenegalado - ok to resume anticoagulation.  Heparin now therapeutic   Home warfarin dose: 2mg  daily except 3mg  on Sat and Sun  Goal of Therapy:  Heparin level 0.3-0.7 units/ml  INR 2-3 Monitor platelets by anticoagulation protocol: Yes   Plan:  -Continue heparin IV at 1300 units/hr  - Daily hep lvl cbc  Isaac BlissMichael Abby Tucholski, PharmD,  BCPS, BCCCP Clinical Pharmacist Clinical phone for 02/09/2018 from 1430 - 2300: [phone] If after 2300, please call main pharmacy at: [phone] 02/09/2018 7:16 PM

## 2018-02-09 NOTE — Consult Note (Addendum)
Requesting Physician: Dr. Sunnie Nielsenegalado    Chief Complaint: Asymmetric pupils and right sided weakness   History obtained from:  chart  HPI:                                                                                                                                         Natasha Martin is an 57 y.o. female with multiple medical problems including new onset Afib, sepsis, ARDS, possible PNA and AMS. This am staff noted patient to have unequal pupils and mild right sided weakness in the context of continued encephalopathy. On exam she was able to follow commands but appeared somewhat weaker on right side with left pupil 5 mm, and right pupil 3 mm, both briskly reactive. No known LSN. Code stroke called but due to anticoagulation she was not a tPA candidate. Due to LKN > 24 hours she was not a thrombectomy candidate. CT head showed no bleed, mass or CVA. Currently on Heparin bridge with coumadin for new onset Afib.   Date last known well: Date: 02/09/2018 Time last known well: Unable to determine tPA Given: No: On anticoagulation and out of time window NIHSS-5  Modified Rankin: Rankin Score=1   Past Medical History:  Diagnosis Date  . Anxiety   . Diabetes (HCC)   . Dysphagia   . Hyperlipidemia   . Hypertension   . Mild mental slowing   . Poor historian   . Schizophrenia (HCC)   . Seizures (HCC)    has not had a seizure since last year. Unknown orgin- no family available to come and patient does not know.    Past Surgical History:  Procedure Laterality Date  . BIOPSY  01/28/2014   Procedure: BIOPSY (Gastric);  Surgeon: West BaliSandi L Fields, MD;  Location: [facility];  Service: Endoscopy;;  . COLONOSCOPY WITH PROPOFOL N/A 01/28/2014   NGE:[id number]:normal mucosa in the terminal ileum/small internal and external hemorrhoids  . ESOPHAGOGASTRODUODENOSCOPY (EGD) WITH PROPOFOL N/A 01/28/2014   LKG:[id number]:reflux esophagitis/stricture at the gastro junction/moderate erosive  . 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 POLYPECTOMY  01/28/2014   Procedure: POLYPECTOMY (Rectal);  Surgeon: West BaliSandi L Fields, MD;  Location: [facility];  Service: Endoscopy;;  . SAVORY DILATION N/A 01/28/2014   Procedure: SAVORY DILATION  (12.158mm to 16mm);  Surgeon: West BaliSandi L Fields, MD;  Location: [facility];  Service: Endoscopy;  Laterality: N/A;    Family History  Problem Relation Age of Onset  . Colon cancer Unknown        unknown         Social History:  reports that she has never smoked. She has never used smokeless tobacco. She reports that she does not drink alcohol or use drugs.  Allergies:  Allergies  Allergen Reactions  . Sulfa Antibiotics Rash    Medications:  Scheduled: . amiodarone  200 mg Oral BID  . benztropine  1 mg Oral BID  . chlorhexidine  15 mL Mouth Rinse BID  . insulin aspart  0-15 Units Subcutaneous TID WC  . insulin aspart  0-5 Units Subcutaneous QHS  . insulin glargine  30 Units Subcutaneous Daily  . lurasidone  80 mg Oral BID WC  . mouth rinse  15 mL Mouth Rinse q12n4p  . metoprolol succinate  100 mg Oral BID AC  . mupirocin cream   Topical Daily  . pantoprazole  40 mg Oral QHS  . potassium chloride  40 mEq Oral Daily  . QUEtiapine  600 mg Oral QHS  . warfarin  2 mg Oral ONCE-1800  . Warfarin - Pharmacist Dosing Inpatient   Does not apply q1800   ROS:                                                                                                                                       History obtained from unobtainable from patient due to mental status  General Examination:                                                                                                      Blood pressure (!) 142/61, pulse 71, temperature 99.1 F (37.3 C), temperature source Oral, resp. rate (!) 21, height 5\' 2"  (1.575 m), weight 72.4 kg (159 lb 9.8 oz), last  menstrual period 01/21/2014, SpO2 94 %.  HEENT-  Normocephalic, no lesions, without obvious abnormality.  Normal external eye and conjunctiva. Poorly hydrated oral mucosa.  Cardiovascular-  pulses palpable throughout   Lungs- no excessive working breathing.  Saturations within normal limits Abdomen- All 4 quadrants palpated and nontender Extremities- Warm, dry and intact  Neurological Examination Mental Status: Patient is alert, does not know date or age but does know she is in the hospital "cone". Able to follow simple commands with UE. She has delayed response to both verbal output and following commands.  Cranial Nerves: II:  Visual fields grossly normal,  III,IV, VI: ptosis not present, extra-ocular motions intact bilaterally, pupils left 5 mm and right 3 mm both , round, reactive to light and accommodation V,VII: smile asymmetric on the right, neglect to the right with DSS VIII: hearing normal bilaterally IX,X: uvula rises symmetrically XI: bilateral shoulder shrug XII: midline tongue extension Motor: Right : Upper extremity   3/5    Left:     Upper extremity   4/5  Lower extremity   3/5     Lower extremity   3/5 Tone and bulk:normal tone throughout; no atrophy noted Sensory: neglect to right leg to DSS Deep Tendon Reflexes: 3+ and symmetric throughout Cerebellar: normal finger-to-nose,  Gait: not tested   Lab Results: Basic Metabolic Panel: Recent Labs  Lab 02/04/18 0350 02/05/18 0449 02/06/18 0426 02/07/18 0206 02/08/18 0723 02/09/18 0351  NA 141 139 140 143 149* 150*  K 3.7 3.7 3.9 3.7 4.1 3.1*  CL 99* 100* 99* 100* 112* 115*  CO2 30 29 28 29 26 26   GLUCOSE 189* 258* 208* 172* 142* 153*  BUN 20 22* 20 27* 39* 25*  CREATININE 0.97 0.91 0.90 0.99 1.06* 0.96  CALCIUM 8.1* 8.4* 9.1 9.1 8.8* 7.9*  MG 1.9  --  1.9  --   --   --   PHOS 4.2  --  4.3  --   --   --     CBC: Recent Labs  Lab 02/05/18 0449 02/06/18 0426 02/07/18 0206 02/08/18 0220 02/09/18 0351   WBC 8.1 8.8 12.5* 11.9* 5.9  HGB 7.7* 9.3* 9.8* 10.4* 9.1*  HCT 24.7* 30.2* 31.3* 33.2* 29.7*  MCV 99.2 98.7 97.8 100.3* 101.4*  PLT 207 288 409* 433* 282    Lipid Panel: No results for input(s): CHOL, TRIG, HDL, CHOLHDL, VLDL, LDLCALC in the last 168 hours.  CBG: Recent Labs  Lab 02/08/18 0836 02/08/18 1220 02/08/18 1735 02/08/18 2116 02/09/18 0835  GLUCAP 179* 420* 165* 80 191*    Imaging: Dg Chest Port 1 View  Result Date: 02/08/2018 CLINICAL DATA:  High fever.  Lethargy. EXAM: PORTABLE CHEST 1 VIEW COMPARISON:  02/05/2018 FINDINGS: Prominent bronchovascular markings. Mild bilateral interstitial opacities. No airspace consolidation. No convincing pleural effusion or pneumothorax. All lines and tubes have been removed. IMPRESSION: 1. Significantly improved lung aeration when compared to multiple studies. No new lung abnormalities. 2. Lines and tubes have been removed.  No pneumothorax. Electronically Signed   By: Amie Portland M.D.   On: 02/08/2018 09:07   Ct Head Code Stroke Wo Contrast`  Result Date: 02/09/2018 CLINICAL DATA:  Code stroke. Altered mental status. Asymmetric pupil dilation. EXAM: CT HEAD WITHOUT CONTRAST TECHNIQUE: Contiguous axial images were obtained from the base of the skull through the vertex without intravenous contrast. COMPARISON:  CT head without contrast 01/27/2018 FINDINGS: Brain: Mild atrophy and white matter changes are stable. No acute infarct, hemorrhage, or mass lesion is present. Ventricles are of normal size. No significant extra-axial fluid collection is present. The brainstem and cerebellum are normal. Vascular: Atherosclerotic changes are present in the cavernous internal carotid arteries and at the dural margin of both vertebral arteries. No hyperdense vessel is present. Skull: Calvarium is intact. 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 No focal lytic or blastic lesions are present. Sinuses/Orbits: Mucosal thickening is  present inferiorly in the right maxillary sinus. There is minimal mucosal thickening in the posterior sphenoid sinuses bilaterally. Frontal sinuses are clear. Globes and orbits are within normal limits. ASPECTS [facility] Stroke Program Early CT Score) - Ganglionic level infarction (caudate, lentiform nuclei, internal capsule, insula, M1-M3 cortex): 7/7 - Supraganglionic infarction (M4-M6 cortex): 3/3 Total score (0-10 with 10 being normal): 10/10 IMPRESSION: 1. No acute intracranial abnormality or significant interval change. 2. Stable mild atrophy and white matter disease. 3. ASPECTS is 10/10 The above was relayed via text pager to Dr. Otelia Limes on 02/09/2018 at 10:17 . Electronically Signed   By: Marin Roberts  M.D.   On: 02/09/2018 10:19    Assessment and plan discussed with with attending physician and they are in agreement.   Felicie Morn PA-C [facility] [phone]  02/09/2018, 11:22 AM   Assessment: 57 y.o. female with Afib on chronic coumadin brought to hospital due to respiratory failure, fever 103, possible aspiration PNA, AMS, septic shock who was noted to have different sized pupils this AM. Code stroke called due to this. Exam as above. Patient not a tPA candidate due to anticoagulation. Not a thrombectomy candidate due to LKN > 24 hours. New onset Afib currently being bridged with Heparin with coumadin until therapeutic.   Stroke Risk Factors - atrial fibrillation, diabetes mellitus, hyperlipidemia and hypertension  Recommend --HgbA1c, fasting lipid panel --MRI/MRA of the brain without contrast --PT consult, OT consult, Speech consult --Echocardiogram --80 mg of Atorvistatin --Prophylactic therapy-Antiplatelet med: -- currently placed on coumadin and bridging with heparin until therapeutic --Telemetry monitoring --Frequent neuro checks --NPO until passes stroke swallow screen --please page stroke NP  Or  PA  Or MD from 8am -4 pm  as this patient from this time will  be  followed by the stroke.   You can look them up on [email or web address]  Password [email or web address]  Electronically signed: Dr. Caryl Pina

## 2018-02-09 NOTE — Progress Notes (Signed)
PROGRESS NOTE    Natasha Martin  VOZ:[id number] DOB: 1961-07-26 DOA: 01/27/2018 PCP: Neale Burly, MD   Brief Narrative: this 57 year old with diabetes felt mental delay and schizophrenia who also has a reported history of seizures in the chart. For the past 2-3 days she has been having a productive cough and she was found in the bathroom this morning unresponsive. When EMS arrived her saturations were in the 20s. She was taking to [facility] where she was intubated and chest x-ray has shown diffuse bilateral infiltrates.Admitted3/30/2019  Significant Events: 01/27/2018: Transferred from [facility]; intubated; CXR with bilateral infiltrates 01/28/2018: ARDS protocol; Nimbex started 01/30/2018: Nimbex stopped; required propofol > drop in BP and levo started 01/31/2018: Bronchoscopy (cultures with yeast only) 02/05/2018: Extubated, A-line out     Assessment & Plan:   Principal Problem:   Schizophrenia ([place]) Active Problems:   Acute respiratory failure ([place])   ARDS (adult respiratory distress syndrome) ([place])  Acute resp failure 2/2 ARDS likely 2/2 aspiration event.  Extubated 4-08. Now on 1 L oxygen.  Speech swallow evaluation.  Received 7 days of antibiotics.  Follow WBC.  Hold lasix. Taper off solumedrol.   Fevers; 4-11. Cover for aspiration;  Pan-culture. Follow blood culture. Chest x ray improved  Unasyn to cover for aspiration.  Had diarrhea yesterday but she was receiving laxatives.   Acute encephalopathy, AMS;  On 4-11 she was more lethargic, had fever 103. Neuro exam was limited due to AMS. She was off of her Psych med.  Today she is more alert, I notice right Upper extremity weakness and left pupil more dilated than right. Will get CT head stat and might need MRI as well.  On heparin for A fib.  Will hold heparin, until we are able to get CT head.  Neuro consulted.   A fib;  Resume amiodarone, lopressor.  On heparin Gtt.  Started  Coumadin.   HTN;    Resume metoprolol.  Hydralazine PRN.   Septic shock; Off levophed.  Treated with IV antibiotics.   Aortic and mitral valve diseases;  Cardiology was consulted, no work up during this admission.   Anemia;  Hb at 3, on admission, probably not accurate.  Hb now at 9. Follow trend.   DM On lantus and SSI.   Mild mental delay.  B 12 731. , TSH 0.8.   Seizure. ?  She was stared on IV keppra on admission. Per sister patient was not taking ; carbamazepine.   Hypernatremia; hold lasix. Encourage oral intake. Change IV fluids to D 5. Monitor cbg  Schizophrenia, depression;  Appreciate Psych;  Resume night dose Seroquel. Will consider day dose tomorrow. Continue with latuda and cogentin. If continue to tolerates medications will increase congentin to home dose.   Hypokalemia; replete orally.   DVT prophylaxis: heparin.  Code Status: full code.  Family Communication: no family at bedside.  Disposition Plan: PT eval.    Consultants:   CCM   Procedures:  Left ventricle: The cavity size was normal. Wall thickness was   increased in a pattern of mild LVH. Systolic function was normal.   The estimated ejection fraction was in the range of 55% to 60%.   Wall motion was normal; there were no regional wall motion   abnormalities. Features are consistent with a pseudonormal left   ventricular filling pattern, with concomitant abnormal relaxation   and increased filling pressure (grade 2 diastolic dysfunction). - Aortic valve: The aortic valve is markedly abnormal. Appears  trileaflet but I cannot see the left coronary cusp well. The   non-coronary cusp is markedly thickened and calcified. The right   coronary cusp may be fused. There is significant turbulence over   the valve with mild stenosis. Would strongly consider TEE. There   was trivial regurgitation. Valve area (VTI): 1.66 cm^2. Valve   area (Vmax): 1.66 cm^2. Valve area (Vmean): 1.72 cm^2. - Mitral valve: Calcified  annulus. - Left atrium: The atrium was moderately dilated. - Atrial septum: There was increased thickness of the septum,   consistent with lipomatous hypertrophy. - Impressions: Abnormal aortic valve as described above. Also with   signifcant MAC for her age. Would strongly consider TEE.   Antimicrobials: Azithromycin 3/30 >> 4/3 Vancomycin 3/30 >> 4/1 Zosyn 3/30 >> 4/6    Subjective: She is alert. I notice right arm weakness.  Speech soft. Left pupil dilated more than right.    Objective: Vitals:   02/09/18 0200 02/09/18 0347 02/09/18 0502 02/09/18 0748  BP:  (!) 134/57  (!) 147/63  Pulse:    72  Resp:  (!) 23  (!) 21  Temp: 100.2 F (37.9 C) 98.8 F (37.1 C)  99.1 F (37.3 C)  TempSrc: Oral Oral  Oral  SpO2:  96%  94%  Weight:   72.4 kg (159 lb 9.8 oz)   Height:        Intake/Output Summary (Last 24 hours) at 02/09/2018 0923 Last data filed at 02/09/2018 0751 Gross per 24 hour  Intake 2445.75 ml  Output 1925 ml  Net 520.75 ml   Filed Weights   02/07/18 0403 02/08/18 0438 02/09/18 0502  Weight: 71.9 kg (158 lb 8.2 oz) 69.5 kg (153 lb 3.5 oz) 72.4 kg (159 lb 9.8 oz)    Examination:  General exam: NAD, Mucosae Membrane dry.  Respiratory system: Normal respiratory effort, no wheezing.  Cardiovascular system: S 1, S 2 RRR Gastrointestinal system: BS present, soft, nt Central nervous system: Alert, says  few words, right arm with drift, left pupil dilated.  Extremities: no edema.  Skin: No rash.   Data Reviewed: I have personally reviewed following labs and imaging studies  CBC: Recent Labs  Lab 02/05/18 0449 02/06/18 0426 02/07/18 0206 02/08/18 0220 02/09/18 0351  WBC 8.1 8.8 12.5* 11.9* 5.9  HGB 7.7* 9.3* 9.8* 10.4* 9.1*  HCT 24.7* 30.2* 31.3* 33.2* 29.7*  MCV 99.2 98.7 97.8 100.3* 101.4*  PLT 207 288 409* 433* 852   Basic Metabolic Panel: Recent Labs  Lab 02/04/18 0350 02/05/18 0449 02/06/18 0426 02/07/18 0206 02/08/18 0723 02/09/18 0351   NA 141 139 140 143 149* 150*  K 3.7 3.7 3.9 3.7 4.1 3.1*  CL 99* 100* 99* 100* 112* 115*  CO2 _0 GLUCOSE 189* 258* 208* 172* 142* 153*  BUN 20 22* 20 27* 39* 25*  CREATININE 0.97 0.91 0.90 0.99 1.06* 0.96  CALCIUM 8.1* 8.4* 9.1 9.1 8.8* 7.9*  MG 1.9  --  1.9  --   --   --   PHOS 4.2  --  4.3  --   --   --    GFR: Estimated Creatinine Clearance: 61.7 mL/min (by C-G formula based on SCr of 0.96 mg/dL). Liver Function Tests: Recent Labs  Lab 02/09/18 0351  AST 19  ALT 22  ALKPHOS 79  BILITOT 0.6  PROT 5.6*  ALBUMIN 2.4*   No results for input(s): LIPASE, AMYLASE in the last 168 hours. No results for input(s):  AMMONIA in the last 168 hours. Coagulation Profile: Recent Labs  Lab 02/07/18 1538 02/08/18 0220 02/09/18 0351  INR 1.22 1.19 1.50   Cardiac Enzymes: No results for input(s): CKTOTAL, CKMB, CKMBINDEX, TROPONINI in the last 168 hours. BNP (last 3 results) No results for input(s): PROBNP in the last 8760 hours. HbA1C: No results for input(s): HGBA1C in the last 72 hours. CBG: Recent Labs  Lab 02/08/18 0836 02/08/18 1220 02/08/18 1735 02/08/18 2116 02/09/18 0835  GLUCAP 179* 420* 165* 80 191*   Lipid Profile: No results for input(s): CHOL, HDL, LDLCALC, TRIG, CHOLHDL, LDLDIRECT in the last 72 hours. Thyroid Function Tests: Recent Labs    02/07/18 1158  TSH 0.822   Anemia Panel: Recent Labs    02/07/18 1158  VITAMINB12 731   Sepsis Labs: Recent Labs  Lab 02/02/18 1059  PROCALCITON <0.10    Recent Results (from the past 240 hour(s))  Culture, bal-quantitative     Status: Abnormal   Collection Time: 01/31/18  1:52 PM  Result Value Ref Range Status   Specimen Description BRONCHIAL ALVEOLAR LAVAGE  Final   Special Requests Normal  Final   Gram Stain   Final    ABUNDANT WBC PRESENT, PREDOMINANTLY PMN NO ORGANISMS SEEN Performed at [facility], [place] [address] [place], [place] [address]    Culture 10,000 COLONIES/mL  CANDIDA ALBICANS (A)  Final   Report Status 02/03/2018 FINAL  Final         Radiology Studies: Dg Chest Port 1 View  Result Date: 02/08/2018 CLINICAL DATA:  High fever.  Lethargy. EXAM: PORTABLE CHEST 1 VIEW COMPARISON:  02/05/2018 FINDINGS: Prominent bronchovascular markings. Mild bilateral interstitial opacities. No airspace consolidation. No convincing pleural effusion or pneumothorax. All lines and tubes have been removed. IMPRESSION: 1. Significantly improved lung aeration when compared to multiple studies. No new lung abnormalities. 2. Lines and tubes have been removed.  No pneumothorax. Electronically Signed   By: Lajean Manes M.D.   On: 02/08/2018 09:07   Dg Swallowing Func-speech Pathology  Result Date: 02/07/2018 Objective Swallowing Evaluation: Type of Study: MBS-Modified Barium Swallow Study  Patient Details Name: Natasha Martin MRN: [id number] Date of Birth: December 04, 1960 Today's Date: 02/07/2018 Time: SLP Start Time (ACUTE ONLY): 77 -SLP Stop Time (ACUTE ONLY): 1034 SLP Time Calculation (min) (ACUTE ONLY): 14 min Past Medical History: Past Medical History: Diagnosis Date . Anxiety  . Diabetes ([place])  . Dysphagia  . Hyperlipidemia  . Hypertension  . Mild mental slowing  . Poor historian  . Schizophrenia ([place])  . Seizures ([place])   has not had a seizure since last year. Unknown orgin- no family available to come and patient does not know. Past Surgical History: Past Surgical History: Procedure Laterality Date . BIOPSY  01/28/2014  Procedure: BIOPSY (Gastric);  Surgeon: Danie Binder, MD;  Location: [facility];  Service: Endoscopy;; . COLONOSCOPY WITH PROPOFOL N/A 01/28/2014  DDU:KGURKY mucosa in the terminal ileum/small internal and external hemorrhoids . ESOPHAGOGASTRODUODENOSCOPY (EGD) WITH PROPOFOL N/A 01/28/2014  HCW:CBJSEG esophagitis/stricture at the gastro junction/moderate erosive . 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 POLYPECTOMY  01/28/2014  Procedure:  POLYPECTOMY (Rectal);  Surgeon: Danie Binder, MD;  Location: [facility];  Service: Endoscopy;; . SAVORY DILATION N/A 01/28/2014  Procedure: SAVORY DILATION  (12.73m to 164m;  Surgeon: SaDanie BinderMD;  Location: [facility];  Service: Endoscopy;  Laterality: N/A; HPI: Pt is a [age]ear old [facility] after being found unresponsive with report of productive cough, mental delay, and schizophrenia. She has a past medical history of Anxiety, Diabetes ([place]), Dysphagia, Hyperlipidemia, Hypertension, Mild mental slowing, Poor historian, Schizophrenia ([place]), and Seizures ([place]). Surgical Hx includes Esophagogastroduodenoscopy (egd) with propofol (01/28/2014) and Savory dilation (01/28/2014). When EMS arrived her saturations were in the 20s. Pt was was intubated on arrival (01/27/18) and chest x-ray has showed diffuse bilateral infiltrates. ARDSlikely 2/2 aspirationevent. Extubated 4/8. CXR (4/9) reveals No change to BILATERAL pulmonary opacities. CT Head (3/30) revealed no evidence of acute intracranial abnormality and mild chronic small-vessel white matter ischemic changes.  Subjective: Pt alert with impaired comprehension but alert throughout session Assessment / Plan / Recommendation CHL IP CLINICAL IMPRESSIONS 02/07/2018 Clinical Impression -- SLP Visit Diagnosis Dysphagia, unspecified (R13.10) Attention and concentration deficit following -- Frontal lobe and executive function deficit following -- Impact on safety and function --   CHL IP TREATMENT RECOMMENDATION 02/07/2018 Treatment Recommendations Therapy as outlined in treatment plan below   Prognosis 02/07/2018 Prognosis for Safe Diet Advancement Good Barriers to Reach Goals Cognitive deficits Barriers/Prognosis Comment -- CHL IP DIET RECOMMENDATION 02/07/2018 SLP Diet Recommendations Honey thick liquids;Dysphagia 1 (Puree) solids Liquid Administration via Cup;No straw Medication Administration Crushed with puree Compensations Slow rate;Small  sips/bites Postural Changes Seated upright at 90 degrees   CHL IP OTHER RECOMMENDATIONS 02/07/2018 Recommended Consults -- Oral Care Recommendations Oral care BID Other Recommendations --   CHL IP FOLLOW UP RECOMMENDATIONS 02/07/2018 Follow up Recommendations (No Data)   CHL IP FREQUENCY AND DURATION 02/07/2018 Speech Therapy Frequency (ACUTE ONLY) min 2x/week Treatment Duration 2 weeks      CHL IP ORAL PHASE 02/07/2018 Oral Phase Impaired Oral - Pudding Teaspoon -- Oral - Pudding Cup -- Oral - Honey Teaspoon -- Oral - Honey Cup Lingual/palatal residue;Decreased bolus cohesion;Right anterior bolus loss;Left anterior bolus loss Oral - Nectar Teaspoon -- Oral - Nectar Cup Right anterior bolus loss;Left anterior bolus loss;Lingual/palatal residue;Premature spillage;Decreased bolus cohesion Oral - Nectar Straw Right anterior bolus loss;Left anterior bolus loss;Lingual/palatal residue;Premature spillage;Decreased bolus cohesion Oral - Thin Teaspoon -- Oral - Thin Cup Right anterior bolus loss;Left anterior bolus loss;Lingual/palatal residue;Premature spillage;Decreased bolus cohesion Oral - Thin Straw -- Oral - Puree -- Oral - Mech Soft -- Oral - Regular Delayed oral transit;Weak lingual manipulation Oral - Multi-Consistency -- Oral - Pill -- Oral Phase - Comment --  CHL IP PHARYNGEAL PHASE 02/07/2018 Pharyngeal Phase Impaired Pharyngeal- Pudding Teaspoon -- Pharyngeal -- Pharyngeal- Pudding Cup -- Pharyngeal -- Pharyngeal- Honey Teaspoon -- Pharyngeal -- Pharyngeal- Honey Cup Pharyngeal residue - valleculae Pharyngeal -- Pharyngeal- Nectar Teaspoon -- Pharyngeal -- Pharyngeal- Nectar Cup Delayed swallow initiation-pyriform sinuses;Pharyngeal residue - valleculae Pharyngeal -- Pharyngeal- Nectar Straw Penetration/Aspiration during swallow;Reduced airway/laryngeal closure Pharyngeal Material enters airway, passes BELOW cords without attempt by patient to eject out (silent aspiration) Pharyngeal- Thin Teaspoon -- Pharyngeal --  Pharyngeal- Thin Cup Penetration/Aspiration before swallow;Penetration/Aspiration during swallow Pharyngeal Material enters airway, passes BELOW cords without attempt by patient to eject out (silent aspiration) Pharyngeal- Thin Straw -- Pharyngeal -- Pharyngeal- Puree -- Pharyngeal -- Pharyngeal- Mechanical Soft -- Pharyngeal -- Pharyngeal- Regular WFL Pharyngeal -- Pharyngeal- Multi-consistency -- Pharyngeal -- Pharyngeal- Pill -- Pharyngeal -- Pharyngeal Comment --  CHL IP CERVICAL ESOPHAGEAL PHASE 02/07/2018 Cervical Esophageal Phase Impaired Pudding Teaspoon -- Pudding Cup -- Honey Teaspoon -- Honey Cup -- Nectar Teaspoon -- Nectar Cup -- Nectar Straw -- Thin Teaspoon -- Thin Cup -- Thin Straw -- Puree -- Mechanical Soft --  Regular -- Multi-consistency -- Pill -- Cervical Esophageal Comment -- No flowsheet data found. [place] Siren 02/07/2018, 1:47 PM  Orbie Pyo Colvin Caroli.Ed CCC-SLP Pager [phone]                  Scheduled Meds: . amiodarone  200 mg Oral BID  . benztropine  1 mg Oral BID  . chlorhexidine  15 mL Mouth Rinse BID  . insulin aspart  0-15 Units Subcutaneous TID WC  . insulin aspart  0-5 Units Subcutaneous QHS  . insulin glargine  30 Units Subcutaneous Daily  . lurasidone  80 mg Oral BID WC  . mouth rinse  15 mL Mouth Rinse q12n4p  . metoprolol succinate  100 mg Oral BID AC  . mupirocin cream   Topical Daily  . pantoprazole  40 mg Oral QHS  . potassium chloride  40 mEq Oral Daily  . QUEtiapine  600 mg Oral QHS  . warfarin  2 mg Oral ONCE-1800  . Warfarin - Pharmacist Dosing Inpatient   Does not apply q1800   Continuous Infusions: . sodium chloride Stopped (02/08/18 1142)  . ampicillin-sulbactam (UNASYN) IV 3 g (02/09/18 0921)  . dextrose 100 mL/hr at 02/09/18 0654  . heparin 1,300 Units/hr (02/09/18 0458)  . levETIRAcetam Stopped (02/09/18 0210)     LOS: 13 days    Time spent: 35 minutes.     Elmarie Shiley, MD [facility] Pager  [phone]  If 7PM-7AM, please contact night-coverage [email or web address] Password [email or web address] 02/09/2018, 9:23 AM

## 2018-02-09 NOTE — Progress Notes (Signed)
ANTICOAGULATION CONSULT NOTE - Follow Up Consult  Pharmacy Consult for Heparin (while INR is low) Indication: atrial fibrillation  Allergies  Allergen Reactions  . Sulfa Antibiotics Rash    Patient Measurements: Height: 5\' 2"  (157.5 cm) Weight: 153 lb 3.5 oz (69.5 kg) IBW/kg (Calculated) : 50.1 Heparin Dosing Weight: 68 kg  Vital Signs: Temp: 98.8 F (37.1 C) (04/12 0347) Temp Source: Oral (04/12 0347) BP: 134/57 (04/12 0347)  Labs: Recent Labs    02/07/18 0206 02/07/18 0902 02/07/18 1538 02/08/18 0220 02/08/18 0723 02/09/18 0351  HGB 9.8*  --   --  10.4*  --  9.1*  HCT 31.3*  --   --  33.2*  --  29.7*  PLT 409*  --   --  433*  --  282  LABPROT  --   --  15.3* 15.0  --  18.0*  INR  --   --  1.22 1.19  --  1.50  HEPARINUNFRC 0.89* 0.75*  --  0.56  --  0.86*  CREATININE 0.99  --   --   --  1.06*  --     Estimated Creatinine Clearance: 54.8 mL/min (A) (by C-G formula based on SCr of 1.06 mg/dL (H)).  Assessment: [age] yoF with a history of AFib on warfarin PTA found unresponsive at home. Pharmacy consulted to start IV heparin while holding oral AC (she failed the swallow evaluation) -heparin level = 0.86 -INR= 1.50 -Hgb: 9.1  Goal of Therapy:  Heparin level 0.3-0.7 units/ml Monitor platelets by anticoagulation protocol: Yes   Plan:  -Dec heparin to 1300 units/hr -1300 HL  Abran DukeJames Gael Londo, PharmD, BCPS Clinical Pharmacist Phone: [phone]

## 2018-02-09 NOTE — Clinical Social Work Placement (Signed)
   CLINICAL SOCIAL WORK PLACEMENT  NOTE  Date:  02/09/2018  Patient Details  Name: Natasha Martin MRN: [id number] Date of Birth: [birth year]  Clinical Social Work is seeking post-discharge placement for this patient at the Skilled  Nursing Facility level of care (*CSW will initial, date and re-position this form in  chart as items are completed):  Yes   Patient/family provided with [facility] Clinical Social Work Department's list of facilities offering this level of care within the geographic area requested by the patient (or if unable, by the patient's family).  Yes   Patient/family informed of their freedom to choose among providers that offer the needed level of care, that participate in Medicare, Medicaid or managed care program needed by the patient, have an available bed and are willing to accept the patient.  Yes   Patient/family informed of 's ownership interest in [facility] and [facility], as well as of the fact that they are under no obligation to receive care at these facilities.  PASRR submitted to EDS on 02/09/18     PASRR number received on       Existing PASRR number confirmed on       FL2 transmitted to all facilities in geographic area requested by pt/family on 02/09/18     FL2 transmitted to all facilities within larger geographic area on       Patient informed that his/her managed care company has contracts with or will negotiate with certain facilities, including the following:            Patient/family informed of bed offers received.  Patient chooses bed at       Physician recommends and patient chooses bed at      Patient to be transferred to   on  .  Patient to be transferred to facility by       Patient family notified on   of transfer.  Name of family member notified:        PHYSICIAN Please sign FL2     Additional Comment:    _______________________________________________ Margarito LinerSarah C Kyran Connaughton, LCSW 02/09/2018, 3:42 PM

## 2018-02-09 NOTE — Progress Notes (Signed)
ANTICOAGULATION CONSULT NOTE - Follow Up Consult  Pharmacy Consult for heparin > warfarin Indication: atrial fibrillation  Allergies  Allergen Reactions  . Sulfa Antibiotics Rash    Patient Measurements: Height: 5\' 2"  (157.5 cm) Weight: 159 lb 9.8 oz (72.4 kg) IBW/kg (Calculated) : 50.1 Heparin Dosing Weight: 68 kg  Vital Signs: Temp: 99.1 F (37.3 C) (04/12 0748) Temp Source: Oral (04/12 0748) BP: 147/63 (04/12 0748) Pulse Rate: 72 (04/12 0748)  Labs: Recent Labs    02/07/18 0206 02/07/18 0902 02/07/18 1538 02/08/18 0220 02/08/18 0723 02/09/18 0351  HGB 9.8*  --   --  10.4*  --  9.1*  HCT 31.3*  --   --  33.2*  --  29.7*  PLT 409*  --   --  433*  --  282  LABPROT  --   --  15.3* 15.0  --  18.0*  INR  --   --  1.22 1.19  --  1.50  HEPARINUNFRC 0.89* 0.75*  --  0.56  --  0.86*  CREATININE 0.99  --   --   --  1.06* 0.96    Estimated Creatinine Clearance: 61.7 mL/min (by C-G formula based on SCr of 0.96 mg/dL).  Assessment: 89 yoF with a history of AFib on warfarin PTA found unresponsive at home. Pharmacy consulted to start IV heparin while holding oral AC (previously failed swallow evaluation this admit). Warfarin now resumed 4/9. Heparin level high this AM and heparin rate was reduced. INR up to 1.5 this AM after 2 boosted warfarin doses resumed starting 4/9. Will resume home warfarin dose today. Hg trend down to 9.1, plt wnl. No bleed documented. Noted resumed amiodarone from PTA 4/11.  Home warfarin dose: 2mg  daily except 3mg  on Sat and Sun  Goal of Therapy:  Heparin level 0.3-0.7 units/ml  INR 2-3 Monitor platelets by anticoagulation protocol: Yes   Plan:  -Continue heparin IV at 1300 units/hr -Coumadin 2mg  PO x 1 dose tonight -F/u heparin level at 1300 -Monitor daily heparin level/INR/CBC, s/sx bleeding  Babs BertinHaley Jamaurion Slemmer, PharmD, BCPS Clinical Pharmacist 02/09/2018 8:40 AM

## 2018-02-09 NOTE — Progress Notes (Signed)
Text paged neuro to see if they want heparin gtt started back up .  [place]  [place] Odoherty, RN

## 2018-02-10 ENCOUNTER — Inpatient Hospital Stay (HOSPITAL_COMMUNITY): Payer: Medicare Other

## 2018-02-10 DIAGNOSIS — G40909 Epilepsy, unspecified, not intractable, without status epilepticus: Secondary | ICD-10-CM

## 2018-02-10 DIAGNOSIS — H5702 Anisocoria: Secondary | ICD-10-CM

## 2018-02-10 LAB — CBC
HCT: 30.8 % — ABNORMAL LOW (ref 36.0–46.0)
Hemoglobin: 9.9 g/dL — ABNORMAL LOW (ref 12.0–15.0)
MCH: 30.7 pg (ref 26.0–34.0)
MCHC: 32.1 g/dL (ref 30.0–36.0)
MCV: 95.7 fL (ref 78.0–100.0)
PLATELETS: 241 10*3/uL (ref 150–400)
RBC: 3.22 MIL/uL — AB (ref 3.87–5.11)
RDW: 14 % (ref 11.5–15.5)
WBC: 5.9 10*3/uL (ref 4.0–10.5)

## 2018-02-10 LAB — BASIC METABOLIC PANEL
Anion gap: 9 (ref 5–15)
Anion gap: 6 (ref 5–15)
BUN: 11 mg/dL (ref 6–20)
BUN: 10 mg/dL (ref 6–20)
CHLORIDE: 105 mmol/L (ref 101–111)
CHLORIDE: 108 mmol/L (ref 101–111)
CO2: 23 mmol/L (ref 22–32)
CO2: 21 mmol/L — ABNORMAL LOW (ref 22–32)
CREATININE: 0.67 mg/dL (ref 0.44–1.00)
Calcium: 7.6 mg/dL — ABNORMAL LOW (ref 8.9–10.3)
Calcium: 7.4 mg/dL — ABNORMAL LOW (ref 8.9–10.3)
Creatinine, Ser: 0.73 mg/dL (ref 0.44–1.00)
GFR calc Af Amer: >60 mL/min (ref >60)
GFR calc Af Amer: >60 mL/min (ref >60)
GFR calc non Af Amer: >60 mL/min (ref >60)
GLUCOSE: 195 mg/dL — AB (ref 65–99)
Glucose, Bld: 192 mg/dL — ABNORMAL HIGH (ref 65–99)
POTASSIUM: 2.6 mmol/L — AB (ref 3.5–5.1)
Potassium: 3.6 mmol/L (ref 3.5–5.1)
SODIUM: 137 mmol/L (ref 135–145)
SODIUM: 135 mmol/L (ref 135–145)

## 2018-02-10 LAB — GLUCOSE, CAPILLARY
GLUCOSE-CAPILLARY: 160 mg/dL — AB (ref 65–99)
GLUCOSE-CAPILLARY: 263 mg/dL — AB (ref 65–99)
GLUCOSE-CAPILLARY: 274 mg/dL — AB (ref 65–99)
Glucose-Capillary: 188 mg/dL — ABNORMAL HIGH (ref 65–99)

## 2018-02-10 LAB — PHOSPHORUS: PHOSPHORUS: 2.1 mg/dL — AB (ref 2.5–4.6)

## 2018-02-10 LAB — PROTIME-INR
INR: 1.78
Prothrombin Time: 20.6 seconds — ABNORMAL HIGH (ref 11.4–15.2)

## 2018-02-10 LAB — MAGNESIUM: Magnesium: 1.4 mg/dL — ABNORMAL LOW (ref 1.7–2.4)

## 2018-02-10 LAB — HEPARIN LEVEL (UNFRACTIONATED): Heparin Unfractionated: 0.52 IU/mL (ref 0.30–0.70)

## 2018-02-10 MED ORDER — POTASSIUM CHLORIDE 10 MEQ/100ML IV SOLN
10.0000 meq | INTRAVENOUS | Status: AC
Start: 2018-02-10 — End: 2018-02-10
  Administered 2018-02-10 (×4): 10 meq via INTRAVENOUS
  Filled 2018-02-10: qty 100

## 2018-02-10 MED ORDER — MAGNESIUM SULFATE 2 GM/50ML IV SOLN
2.0000 g | Freq: Once | INTRAVENOUS | Status: AC
  Administered 2018-02-10: 2 g via INTRAVENOUS
  Filled 2018-02-10: qty 50

## 2018-02-10 MED ORDER — QUETIAPINE FUMARATE 100 MG PO TABS: 400.0000 mg | ORAL_TABLET | Freq: Every day | ORAL | Status: DC

## 2018-02-10 MED ORDER — IOPAMIDOL (ISOVUE-370) INJECTION 76%
INTRAVENOUS | Status: AC
  Administered 2018-02-10: 16:00:00
  Filled 2018-02-10: qty 50

## 2018-02-10 MED ORDER — IOPAMIDOL (ISOVUE-370) INJECTION 76%
50.0000 mL | Freq: Once | INTRAVENOUS | Status: AC | PRN
  Administered 2018-02-10: 50 mL via INTRAVENOUS

## 2018-02-10 MED ORDER — LEVETIRACETAM 500 MG PO TABS: 1000.0000 mg | ORAL_TABLET | Freq: Two times a day (BID) | ORAL | Status: DC

## 2018-02-10 MED ORDER — POTASSIUM CHLORIDE CRYS ER 20 MEQ PO TBCR
40.0000 meq | EXTENDED_RELEASE_TABLET | Freq: Once | ORAL | Status: AC
  Administered 2018-02-10: 40 meq via ORAL
  Filled 2018-02-10: qty 2

## 2018-02-10 MED ORDER — METOPROLOL SUCCINATE ER 50 MG PO TB24
50.0000 mg | ORAL_TABLET | Freq: Every day | ORAL | Status: DC
  Administered 2018-02-11 – 2018-02-13 (×3): 50 mg via ORAL
  Filled 2018-02-10 (×3): qty 1

## 2018-02-10 MED ORDER — K PHOS MONO-SOD PHOS DI & MONO 155-852-130 MG PO TABS
500.0000 mg | ORAL_TABLET | Freq: Once | ORAL | Status: AC
  Administered 2018-02-10: 500 mg via ORAL
  Filled 2018-02-10: qty 2

## 2018-02-10 MED ORDER — LEVETIRACETAM 500 MG PO TABS
1000.0000 mg | ORAL_TABLET | Freq: Two times a day (BID) | ORAL | Status: DC
  Administered 2018-02-10 – 2018-02-13 (×6): 1000 mg via ORAL
  Filled 2018-02-10 (×6): qty 2

## 2018-02-10 MED ORDER — POTASSIUM CHLORIDE 20 MEQ PO PACK
40.0000 meq | PACK | Freq: Once | ORAL | Status: AC
  Administered 2018-02-10: 40 meq via ORAL
  Filled 2018-02-10: qty 2

## 2018-02-10 MED ORDER — QUETIAPINE FUMARATE 100 MG PO TABS
400.0000 mg | ORAL_TABLET | Freq: Every morning | ORAL | Status: DC
  Administered 2018-02-10: 400 mg via ORAL
  Filled 2018-02-10: qty 4

## 2018-02-10 MED ORDER — WARFARIN SODIUM 3 MG PO TABS
3.0000 mg | ORAL_TABLET | Freq: Once | ORAL | Status: AC
  Administered 2018-02-10: 3 mg via ORAL
  Filled 2018-02-10: qty 1

## 2018-02-10 NOTE — Clinical Social Work Note (Signed)
PASRR still under review. This will not be reviewed over the weekend. Pt will be unable to go to SNF over the weekend without a PASRR.  Clarendon HillsBridget Erial Martin, [place]CSWA [phone]

## 2018-02-10 NOTE — Progress Notes (Signed)
CRITICAL VALUE ALERT  Critical Value: K+ 2.6  Date & Time Notied:  9:15  Provider Notified: 9:16  Orders Received/Actions taken: .Marland Kitchen..Marland Kitchen

## 2018-02-10 NOTE — Progress Notes (Signed)
Potassium run paused for CT contrast.  [place]  Liam Cammarata, RN

## 2018-02-10 NOTE — Progress Notes (Signed)
Attending notified of 633 QTc.   [place]  Joaopedro Eschbach, RN

## 2018-02-10 NOTE — Progress Notes (Signed)
STROKE TEAM PROGRESS NOTE   SUBJECTIVE (INTERVAL HISTORY) The patient's nurse and the patient's daughter were both at the bedside.  The patient apparently is feeling much better.  Much of her speech is mildly slurry; however, her daughter stated that this was pretty much at baseline due to her psychiatric disorder and on psych meds.  The patient's daughter was not previously aware that her mother had unequal pupils.  She has never had a stroke according to her daughter and previously had no focal weakness.  Her nurse was not aware of any recent handheld nebulizer treatments.   OBJECTIVE Temp:  [97.5 F (36.4 C)-99 F (37.2 C)] 99 F (37.2 C) (04/13 0746) Pulse Rate:  [56-71] 61 (04/13 0746) Cardiac Rhythm: Sinus bradycardia (04/13 0700) Resp:  [17-21] 19 (04/12 2343) BP: (138-164)/(51-75) 145/56 (04/13 0746) SpO2:  [97 %-99 %] 97 % (04/13 0746) Weight:  [158 lb 15.2 oz (72.1 kg)] 158 lb 15.2 oz (72.1 kg) (04/13 0500)  CBC:  Recent Labs  Lab 02/09/18 0351 02/10/18 0224  WBC 5.9 5.9  HGB 9.1* 9.9*  HCT 29.7* 30.8*  MCV 101.4* 95.7  PLT 282 102    Basic Metabolic Panel:  Recent Labs  Lab 02/04/18 0350  02/06/18 0426  02/08/18 0723 02/09/18 0351  NA 141   < > 140   < > 149* 150*  K 3.7   < > 3.9   < > 4.1 3.1*  CL 99*   < > 99*   < > 112* 115*  CO2 30   < > 28   < > 26 26  GLUCOSE 189*   < > 208*   < > 142* 153*  BUN 20   < > 20   < > 39* 25*  CREATININE 0.97   < > 0.90   < > 1.06* 0.96  CALCIUM 8.1*   < > 9.1   < > 8.8* 7.9*  MG 1.9  --  1.9  --   --   --   PHOS 4.2  --  4.3  --   --   --    < > = values in this interval not displayed.    Lipid Panel:     Component Value Date/Time   TRIG 130 02/02/2018 1059   HgbA1c:  Lab Results  Component Value Date   HGBA1C 5.5 01/27/2018   Urine Drug Screen:     Component Value Date/Time   COCAINSCRNUR Negative 01/27/2018 1636   LABBARB Negative 01/27/2018 1636    Alcohol Level No results found for: ETH  IMAGING I  have personally reviewed the radiological images below and agree with the radiology interpretations.   Mr Brain Wo Contrast 02/10/2018 IMPRESSION:  1. No acute intracranial process on this motion degraded examination.  2. Borderline parenchymal brain volume loss and mild chronic small vessel ischemic disease.   Ct Head Code Stroke Wo Contrast 02/09/2018 IMPRESSION:  1. No acute intracranial abnormality or significant interval change.  2. Stable mild atrophy and white matter disease.  3. ASPECTS is 10/10   Transthoracic Echocardiogram  01/28/2018 Study Conclusions - Left ventricle: The cavity size was normal. Wall thickness was   increased in a pattern of mild LVH. Systolic function was normal.   The estimated ejection fraction was in the range of 55% to 60%.   Wall motion was normal; there were no regional wall motion   abnormalities. Features are consistent with a pseudonormal left   ventricular filling pattern, with concomitant  abnormal relaxation   and increased filling pressure (grade 2 diastolic dysfunction). - Aortic valve: The aortic valve is markedly abnormal. Appears   trileaflet but I cannot see the left coronary cusp well. The   non-coronary cusp is markedly thickened and calcified. The right   coronary cusp may be fused. There is significant turbulence over   the valve with mild stenosis. Would strongly consider TEE. There   was trivial regurgitation. Valve area (VTI): 1.66 cm^2. Valve   area (Vmax): 1.66 cm^2. Valve area (Vmean): 1.72 cm^2. - Mitral valve: Calcified annulus. - Left atrium: The atrium was moderately dilated. - Atrial septum: There was increased thickness of the septum,   consistent with lipomatous hypertrophy. - Impressions: Abnormal aortic valve as described above. Also with   signifcant MAC for her age. Would strongly consider TEE. Impressions: - Abnormal aortic valve as described above. Also with signifcant   MAC for her age. Would strongly  consider TEE.  CTA head and neck pending   PHYSICAL EXAM Vitals:   02/09/18 1905 02/09/18 2343 02/10/18 0500 02/10/18 0746  BP: (!) 138/51 (!) 155/65  (!) 145/56  Pulse: (!) 56 71  61  Resp: 20 19    Temp: 98.7 F (37.1 C) 98.4 F (36.9 C)  99 F (37.2 C)  TempSrc: Oral Oral  Oral  SpO2: 99% 97%  97%  Weight:   158 lb 15.2 oz (72.1 kg)   Height:        General - pleasant 57 year old female in bed in no acute distress. Heart - Regular rate and rhythm -2/6 soft systolic murmur. Lungs - Clear to auscultation anteriorly Abdomen - Soft - non tender Extremities - Distal pulses weak to absent - no edema although the patient is wearing SCDs. Skin - Warm and dry  Mental Status: She is alert and follows most commands.  Her speech is difficult to understand. Cranial Nerves: II: Discs not visualized; left pupil approximately 5 mm and right pupil approximately 4 mm in the dark.  With light, both briskly reactive, left pupil down to 2.51m, right pupil down to 238m  III,IV, VI: ptosis not present, extra-ocular motions intact bilaterally V,VII: smile symmetric, facial light touch sensation normal bilaterally VIII: hearing normal bilaterally IX,X: gag reflex present XI: bilateral shoulder shrug intact. XII: midline tongue extension Motor: Right : Upper extremity   5/5    Left:     Upper extremity   5/5  Lower extremity   5/5     Lower extremity   5/5 Tone and bulk:normal tone throughout; no atrophy noted Sensory: Light touch intact throughout, bilaterally Cerebellar: Mild intentional tremor on right FTN testing. Right FTN intact.  Lower extremities not tested. Gait: not tested    ASSESSMENT/PLAN Ms. PaAHNA KONKLEs a [age].o. female with history of seizures, mentally slow, hypertension, hyperlipidemia, dysphagia, diabetes, anxiety, schizophrenia, presenting with new onset atrial fibrillation, sepsis, ARDS, altered mental status, unequal pupil size with possible mild right-sided  weakness. She did not receive IV t-PA due to anticoagulation.  anisocoria - parallel reaction under light and dark, most likely physiological anisocoria. Not specifically pointing to either sympathetic or parasympathetic cause. But will do CTA head and neck to rule out pancoast tumor, ICA dissection, or aneurysms but less likely.  Resultant  Mild anisocoria  CT head - No acute intracranial abnormality   MRI head - No acute intracranial process  CTA head and neck pending  2D Echo - 01/28/18  - EF 55-60%, abnormal AV  and MAC.  TEE was recommended but as per primary team.  LDL - not performed  HgbA1c - 5.5  VTE prophylaxis - SCDs -IV heparin -Coumadin DIET - DYS 1 Room service appropriate? Yes; Fluid consistency: Honey Thick  aspirin 325 mg daily and warfarin daily prior to admission, now on heparin IV and warfarin daily for afib management  Ongoing aggressive stroke risk factor management  Therapy recommendations:  SNF  Disposition:  Pending  New diagnosed afib  Cardiology on board  MRI no acute infarct  On heparin bridge to coumadin  Hypertension  Stable . Long-term BP goal normotensive  Other Stroke Risk Factors  Obesity, Body mass index is 29.07 kg/m., recommend weight loss, diet and exercise as appropriate   Other Active Problems  Hypokalemia   ARDS - as per primary team  Seizure - on keppra  Schizophrenia - on cogentin and seroquel  Rosalin Hawking, MD PhD Stroke Neurology 02/10/2018 11:36 AM    To contact Stroke Continuity provider, please refer to [email or web address] After hours, contact General Neurology

## 2018-02-10 NOTE — Progress Notes (Addendum)
PROGRESS NOTE    Natasha Martin  ZYS:[id number] DOB: [birth year] DOA: 01/27/2018 PCP: Neale Burly, MD   Brief Narrative: this 57 year old with diabetes felt mental delay and schizophrenia who also has a reported history of seizures in the chart. For the past 2-3 days she has been having a productive cough and she was found in the bathroom this morning unresponsive. When EMS arrived her saturations were in the 20s. She was taking to [facility] where she was intubated and chest x-ray has shown diffuse bilateral infiltrates.Admitted3/30/2019  Significant Events: 01/27/2018: Transferred from [facility]; intubated; CXR with bilateral infiltrates 01/28/2018: ARDS protocol; Nimbex started 01/30/2018: Nimbex stopped; required propofol > drop in BP and levo started 01/31/2018: Bronchoscopy (cultures with yeast only) 02/05/2018: Extubated, A-line out  Assessment & Plan:   Principal Problem:   Schizophrenia ([place]) Active Problems:   Acute respiratory failure ([place])   ARDS (adult respiratory distress syndrome) ([place])  Acute resp failure 2/2 ARDS likely 2/2 aspiration event.  Extubated 4-08. Now on 1 L oxygen.  Speech swallow evaluation.  Received 7 days of antibiotics.  Follow WBC.  Hold lasix. Taper off solumedrol.  Fevers; 4-11. Cover for aspiration;  Pan-culture. blood culture 4-11; no growth to date. . Chest x ray improved  Unasyn to cover for aspiration. Started 4-11 Had diarrhea yesterday but she was receiving laxatives.   Addendum;  Prolong Qt 563 on EKG Replete mg. IV times 2 Replete potasium.  Hold Seroquel tonight.  Check EKG before given latuda tonight.   Acute encephalopathy, AMS;  On 4-11 she was more lethargic, had fever 103. Neuro exam was limited due to AMS. She was off of her Psych med.  4-12;  right Upper extremity weakness and left pupil more dilated than right.  On heparin for A fib.  Neuro consulted. Appreciate help. MRI negative for stroke. CT head  negative.  Plan to get CT angio head neck due to anisocoria.   A fib;  Resume lopressor.  On heparin Gtt.  Started  Coumadin.  Cardiology recommended holding amiodarone.   HTN;  Resume metoprolol.  Hydralazine PRN.   Septic shock; Off levophed.  Treated with IV antibiotics.   Aortic and mitral valve diseases;  Cardiology was consulted, no work up during this admission.   Anemia;  Hb at 3, on admission, probably not accurate.  Hb now at 9. Follow trend.   DM On lantus and SSI.   Mild mental delay.  B 12 731. , TSH 0.8.   Seizure. ?  She was stared on IV keppra on admission. Per sister patient was not taking ; carbamazepine.   Hypernatremia; hold lasix. Encourage oral intake. Resolved. Stop D 5 IV   Schizophrenia, depression;  Appreciate Psych;  Resume night dose Seroquel. Start morning dose of Seroquel.  Continue with latuda and cogentin. If continue to tolerates medications will increase congentin to home dose.   Hypokalemia; replete IV and orally.   DVT prophylaxis: heparin.  Code Status: full code.  Family Communication: no family at bedside.  Disposition Plan: PT eval.    Consultants:   CCM   Procedures:  Left ventricle: The cavity size was normal. Wall thickness was   increased in a pattern of mild LVH. Systolic function was normal.   The estimated ejection fraction was in the range of 55% to 60%.   Wall motion was normal; there were no regional wall motion   abnormalities. Features are consistent with a pseudonormal left   ventricular filling  pattern, with concomitant abnormal relaxation   and increased filling pressure (grade 2 diastolic dysfunction). - Aortic valve: The aortic valve is markedly abnormal. Appears   trileaflet but I cannot see the left coronary cusp well. The   non-coronary cusp is markedly thickened and calcified. The right   coronary cusp may be fused. There is significant turbulence over   the valve with mild stenosis. Would  strongly consider TEE. There   was trivial regurgitation. Valve area (VTI): 1.66 cm^2. Valve   area (Vmax): 1.66 cm^2. Valve area (Vmean): 1.72 cm^2. - Mitral valve: Calcified annulus. - Left atrium: The atrium was moderately dilated. - Atrial septum: There was increased thickness of the septum,   consistent with lipomatous hypertrophy. - Impressions: Abnormal aortic valve as described above. Also with   signifcant MAC for her age. Would strongly consider TEE.   Antimicrobials: Azithromycin 3/30 >> 4/3 Vancomycin 3/30 >> 4/1 Zosyn 3/30 >> 4/6    Subjective: More alert, speaking more per family Follows command.    Objective: Vitals:   02/09/18 2343 02/10/18 0500 02/10/18 0746 02/10/18 1100  BP: (!) 155/65  (!) 145/56 127/69  Pulse: 71  61 62  Resp: 19   20  Temp: 98.4 F (36.9 C)  99 F (37.2 C) 98.5 F (36.9 C)  TempSrc: Oral  Oral Oral  SpO2: 97%  97% 100%  Weight:  72.1 kg (158 lb 15.2 oz)    Height:        Intake/Output Summary (Last 24 hours) at 02/10/2018 1303 Last data filed at 02/10/2018 1151 Gross per 24 hour  Intake 2581.34 ml  Output 2850 ml  Net -268.66 ml   Filed Weights   02/08/18 0438 02/09/18 0502 02/10/18 0500  Weight: 69.5 kg (153 lb 3.5 oz) 72.4 kg (159 lb 9.8 oz) 72.1 kg (158 lb 15.2 oz)    Examination:  General exam: NAD. MMM Respiratory system: Normal respiratory effort.  Cardiovascular system: S 1, S 2 RRR Gastrointestinal system: BS present, soft, nt Central nervous system: alert, follow command moves all 4 extremities.  Extremities: no edema Skin: No rash.   Data Reviewed: I have personally reviewed following labs and imaging studies  CBC: Recent Labs  Lab 02/06/18 0426 02/07/18 0206 02/08/18 0220 02/09/18 0351 02/10/18 0224  WBC 8.8 12.5* 11.9* 5.9 5.9  HGB 9.3* 9.8* 10.4* 9.1* 9.9*  HCT 30.2* 31.3* 33.2* 29.7* 30.8*  MCV 98.7 97.8 100.3* 101.4* 95.7  PLT 288 409* 433* 282 660   Basic Metabolic Panel: Recent Labs    Lab 02/04/18 0350  02/06/18 0426 02/07/18 0206 02/08/18 0723 02/09/18 0351 02/10/18 0814  NA 141   < > 140 143 149* 150* 137  K 3.7   < > 3.9 3.7 4.1 3.1* 2.6*  CL 99*   < > 99* 100* 112* 115* 105  CO2 30   < > _0 GLUCOSE 189*   < > 208* 172* 142* 153* 195*  BUN 20   < > 20 27* 39* 25* 11  CREATININE 0.97   < > 0.90 0.99 1.06* 0.96 0.73  CALCIUM 8.1*   < > 9.1 9.1 8.8* 7.9* 7.6*  MG 1.9  --  1.9  --   --   --   --   PHOS 4.2  --  4.3  --   --   --   --    < > = values in this interval not displayed.   GFR: Estimated Creatinine  Clearance: 73.9 mL/min (by C-G formula based on SCr of 0.73 mg/dL). Liver Function Tests: Recent Labs  Lab 02/09/18 0351  AST 19  ALT 22  ALKPHOS 79  BILITOT 0.6  PROT 5.6*  ALBUMIN 2.4*   No results for input(s): LIPASE, AMYLASE in the last 168 hours. No results for input(s): AMMONIA in the last 168 hours. Coagulation Profile: Recent Labs  Lab 02/07/18 1538 02/08/18 0220 02/09/18 0351 02/10/18 0224  INR 1.22 1.19 1.50 1.78   Cardiac Enzymes: No results for input(s): CKTOTAL, CKMB, CKMBINDEX, TROPONINI in the last 168 hours. BNP (last 3 results) No results for input(s): PROBNP in the last 8760 hours. HbA1C: No results for input(s): HGBA1C in the last 72 hours. CBG: Recent Labs  Lab 02/09/18 1211 02/09/18 1733 02/09/18 2153 02/10/18 0757 02/10/18 1216  GLUCAP 145* 162* 236* 160* 263*   Lipid Profile: No results for input(s): CHOL, HDL, LDLCALC, TRIG, CHOLHDL, LDLDIRECT in the last 72 hours. Thyroid Function Tests: No results for input(s): TSH, T4TOTAL, FREET4, T3FREE, THYROIDAB in the last 72 hours. Anemia Panel: No results for input(s): VITAMINB12, FOLATE, FERRITIN, TIBC, IRON, RETICCTPCT in the last 72 hours. Sepsis Labs: No results for input(s): PROCALCITON, LATICACIDVEN in the last 168 hours.  Recent Results (from the past 240 hour(s))  Culture, bal-quantitative     Status: Abnormal   Collection Time:  01/31/18  1:52 PM  Result Value Ref Range Status   Specimen Description BRONCHIAL ALVEOLAR LAVAGE  Final   Special Requests Normal  Final   Gram Stain   Final    ABUNDANT WBC PRESENT, PREDOMINANTLY PMN NO ORGANISMS SEEN Performed at [facility], [address], [place], [place] [address]    Culture 10,000 COLONIES/mL CANDIDA ALBICANS (A)  Final   Report Status 02/03/2018 FINAL  Final  Culture, blood (routine x 2)     Status: None (Preliminary result)   Collection Time: 02/08/18  8:45 AM  Result Value Ref Range Status   Specimen Description BLOOD RIGHT HAND  Final   Special Requests   Final    BOTTLES DRAWN AEROBIC AND ANAEROBIC Blood Culture adequate volume   Culture   Final    NO GROWTH 1 DAY Performed at [facility], [place] [address], [place], [place] [address]    Report Status PENDING  Incomplete  Culture, blood (routine x 2)     Status: None (Preliminary result)   Collection Time: 02/08/18  8:45 AM  Result Value Ref Range Status   Specimen Description BLOOD LEFT HAND  Final   Special Requests   Final    BOTTLES DRAWN AEROBIC AND ANAEROBIC Blood Culture adequate volume   Culture   Final    NO GROWTH 1 DAY Performed at [facility], [place] [address], [place], [place] [address]    Report Status PENDING  Incomplete  Urine Culture     Status: None   Collection Time: 02/08/18 10:00 AM  Result Value Ref Range Status   Specimen Description URINE, CATHETERIZED  Final   Special Requests NONE  Final   Culture   Final    NO GROWTH Performed at [facility], [place] [address], [place], [place] [address]    Report Status 02/09/2018 FINAL  Final         Radiology Studies: Mr Brain Wo Contrast  Result Date: 02/10/2018 CLINICAL DATA:  Unequal pupils, RIGHT-sided weakness in the context of continued encephalopathy. History of hypertension, hyperlipidemia, diabetes, seizures. EXAM: MRI HEAD  WITHOUT CONTRAST TECHNIQUE: Multiplanar, multiecho pulse sequences of  the brain and surrounding structures were obtained without intravenous contrast. COMPARISON:  CT HEAD February 09, 2018 FINDINGS: Some of the sequences are mildly motion degraded. INTRACRANIAL CONTENTS: No reduced diffusion to suggest acute ischemia or hyperacute demyelination. RIGHT frontal chronic microhemorrhage. The ventricles and sulci are upper limits of normal for patient's age. Mild periventricular and pontine white matter FLAIR T2 hyperintensities compatible chronic small vessel ischemic disease. No suspicious parenchymal signal, masses, mass effect. No abnormal extra-axial fluid collections. No extra-axial masses. VASCULAR: Normal major intracranial vascular flow voids present at skull base. SKULL AND UPPER CERVICAL SPINE: No abnormal sellar expansion. No suspicious calvarial bone marrow signal. Craniocervical junction maintained. SINUSES/ORBITS: Mild paranasal sinus mucosal thickening. Bilateral mastoid effusions.The included ocular globes and orbital contents are non-suspicious. OTHER: None. IMPRESSION: 1. No acute intracranial process on this motion degraded examination. 2. Borderline parenchymal brain volume loss and mild chronic small vessel ischemic disease. Electronically Signed   By: Elon Alas M.D.   On: 02/10/2018 05:34   Ct Head Code Stroke Wo Contrast`  Result Date: 02/09/2018 CLINICAL DATA:  Code stroke. Altered mental status. Asymmetric pupil dilation. EXAM: CT HEAD WITHOUT CONTRAST TECHNIQUE: Contiguous axial images were obtained from the base of the skull through the vertex without intravenous contrast. COMPARISON:  CT head without contrast 01/27/2018 FINDINGS: Brain: Mild atrophy and white matter changes are stable. No acute infarct, hemorrhage, or mass lesion is present. Ventricles are of normal size. No significant extra-axial fluid collection is present. The brainstem and cerebellum are normal. Vascular: Atherosclerotic changes are present in the cavernous internal carotid  arteries and at the dural margin of both vertebral arteries. No hyperdense vessel is present. Skull: Calvarium is intact. 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 No focal lytic or blastic lesions are present. Sinuses/Orbits: Mucosal thickening is present inferiorly in the right maxillary sinus. There is minimal mucosal thickening in the posterior sphenoid sinuses bilaterally. Frontal sinuses are clear. Globes and orbits are within normal limits. ASPECTS [facility] Stroke Program Early CT Score) - Ganglionic level infarction (caudate, lentiform nuclei, internal capsule, insula, M1-M3 cortex): 7/7 - Supraganglionic infarction (M4-M6 cortex): 3/3 Total score (0-10 with 10 being normal): 10/10 IMPRESSION: 1. No acute intracranial abnormality or significant interval change. 2. Stable mild atrophy and white matter disease. 3. ASPECTS is 10/10 The above was relayed via text pager to Dr. Cheral Marker on 02/09/2018 at 10:17 . Electronically Signed   By: San Morelle M.D.   On: 02/09/2018 10:19        Scheduled Meds: . amiodarone  200 mg Oral BID  . benztropine  1 mg Oral BID  . chlorhexidine  15 mL Mouth Rinse BID  . insulin aspart  0-15 Units Subcutaneous TID WC  . insulin aspart  0-5 Units Subcutaneous QHS  . insulin glargine  30 Units Subcutaneous Daily  . iopamidol      . lurasidone  80 mg Oral BID WC  . mouth rinse  15 mL Mouth Rinse q12n4p  . metoprolol succinate  100 mg Oral BID AC  . mupirocin cream   Topical Daily  . pantoprazole  40 mg Oral QHS  . QUEtiapine  400 mg Oral q morning - 10a  . QUEtiapine  600 mg Oral QHS  . warfarin  3 mg Oral ONCE-1800  . Warfarin - Pharmacist Dosing Inpatient   Does not apply q1800   Continuous Infusions: . sodium chloride Stopped (02/08/18 1142)  .  ampicillin-sulbactam (UNASYN) IV Stopped (02/10/18 0935)  . heparin 1,300 Units/hr (02/10/18 0534)  . levETIRAcetam Stopped (02/10/18 0155)  . potassium chloride 10 mEq (02/10/18 1224)      LOS: 14 days    Time spent: 35 minutes.     Elmarie Shiley, MD [facility] Pager [phone]  If 7PM-7AM, please contact night-coverage [email or web address] Password [email or web address] 02/10/2018, 1:03 PM

## 2018-02-10 NOTE — Progress Notes (Signed)
ANTICOAGULATION CONSULT NOTE - Follow Up Consult  Pharmacy Consult for heparin > warfarin Indication: atrial fibrillation  Allergies  Allergen Reactions  . Sulfa Antibiotics Rash    Patient Measurements: Height: 5\' 2"  (157.5 cm) Weight: 159 lb 9.8 oz (72.4 kg) IBW/kg (Calculated) : 50.1 Heparin Dosing Weight: 68 kg  Vital Signs: Temp: 97.9 F (36.6 C) (04/12 1214) Temp Source: Oral (04/12 1214) BP: 145/64 (04/12 1214) Pulse Rate: 71 (04/12 0922)  Labs: Recent Labs    02/07/18 0206  02/07/18 1538 02/08/18 0220 02/08/18 0723 02/09/18 0351 02/09/18 1315  HGB 9.8*  --   --  10.4*  --  9.1*  --   HCT 31.3*  --   --  33.2*  --  29.7*  --   PLT 409*  --   --  433*  --  282  --   LABPROT  --   --  15.3* 15.0  --  18.0*  --   INR  --   --  1.22 1.19  --  1.50  --   HEPARINUNFRC 0.89*   < >  --  0.56  --  0.86* 0.27*  CREATININE 0.99  --   --   --  1.06* 0.96  --    < > = values in this interval not displayed.    Estimated Creatinine Clearance: 61.7 mL/min (by C-G formula based on SCr of 0.96 mg/dL).  Assessment: [age] yoF with a history of AFib on warfarin PTA found unresponsive at home. Pharmacy consulted to start IV heparin while holding oral AC (previously failed swallow evaluation this admit). Warfarin now resumed 4/9.   INR up to 1.78 this AM after boosted warfarin doses starting 4/9. Hgb stableto 9.9, plt wnl. No bleeding documented. Noted resumed amiodarone from PTA 4/11.   Heparin continues to be therapeutic on 1300 units/hr.    Home warfarin dose: 2mg  daily except 3mg  on Sat and Sun  Goal of Therapy:  Heparin level 0.3-0.7 units/ml  INR 2-3 Monitor platelets by anticoagulation protocol: Yes   Plan:  -Continue heparin IV at 1300 units/hr  - Daily hep lvl cbc -Warfarin 3mg  tonight  Sheppard CoilFrank Amaziah Ghosh PharmD., BCPS Clinical Pharmacist 02/10/2018 8:05 AM

## 2018-02-10 NOTE — Progress Notes (Signed)
Paged MD phosphorus results (2.1) as requested.

## 2018-02-11 DIAGNOSIS — I481 Persistent atrial fibrillation: Secondary | ICD-10-CM

## 2018-02-11 LAB — BASIC METABOLIC PANEL
Anion gap: 9 (ref 5–15)
Anion gap: 8 (ref 5–15)
BUN: 14 mg/dL (ref 6–20)
BUN: 10 mg/dL (ref 6–20)
CHLORIDE: 108 mmol/L (ref 101–111)
CO2: 22 mmol/L (ref 22–32)
CO2: 22 mmol/L (ref 22–32)
Calcium: 7.7 mg/dL — ABNORMAL LOW (ref 8.9–10.3)
Calcium: 7.6 mg/dL — ABNORMAL LOW (ref 8.9–10.3)
Chloride: 107 mmol/L (ref 101–111)
Creatinine, Ser: 0.82 mg/dL (ref 0.44–1.00)
Creatinine, Ser: 0.78 mg/dL (ref 0.44–1.00)
GFR calc Af Amer: >60 mL/min (ref >60)
GFR calc Af Amer: >60 mL/min (ref >60)
GFR calc non Af Amer: >60 mL/min (ref >60)
GLUCOSE: 218 mg/dL — AB (ref 65–99)
GLUCOSE: 116 mg/dL — AB (ref 65–99)
POTASSIUM: 3.7 mmol/L (ref 3.5–5.1)
POTASSIUM: 3.3 mmol/L — AB (ref 3.5–5.1)
Sodium: 138 mmol/L (ref 135–145)
Sodium: 138 mmol/L (ref 135–145)

## 2018-02-11 LAB — CBC
HEMATOCRIT: 31.7 % — AB (ref 36.0–46.0)
Hemoglobin: 10.5 g/dL — ABNORMAL LOW (ref 12.0–15.0)
MCH: 31.3 pg (ref 26.0–34.0)
MCHC: 33.1 g/dL (ref 30.0–36.0)
MCV: 94.3 fL (ref 78.0–100.0)
Platelets: 266 10*3/uL (ref 150–400)
RBC: 3.36 MIL/uL — ABNORMAL LOW (ref 3.87–5.11)
RDW: 14.1 % (ref 11.5–15.5)
WBC: 7.6 10*3/uL (ref 4.0–10.5)

## 2018-02-11 LAB — GLUCOSE, CAPILLARY
GLUCOSE-CAPILLARY: 148 mg/dL — AB (ref 65–99)
Glucose-Capillary: 161 mg/dL — ABNORMAL HIGH (ref 65–99)
Glucose-Capillary: 135 mg/dL — ABNORMAL HIGH (ref 65–99)
Glucose-Capillary: 123 mg/dL — ABNORMAL HIGH (ref 65–99)

## 2018-02-11 LAB — PHOSPHORUS: Phosphorus: 2.3 mg/dL — ABNORMAL LOW (ref 2.5–4.6)

## 2018-02-11 LAB — ALBUMIN: Albumin: 2.5 g/dL — ABNORMAL LOW (ref 3.5–5.0)

## 2018-02-11 LAB — PROTIME-INR
INR: 1.64
Prothrombin Time: 19.2 seconds — ABNORMAL HIGH (ref 11.4–15.2)

## 2018-02-11 LAB — C DIFFICILE QUICK SCREEN W PCR REFLEX
C Diff antigen: NEGATIVE
C Diff interpretation: NOT DETECTED
C Diff toxin: NEGATIVE

## 2018-02-11 LAB — MAGNESIUM: Magnesium: 2.2 mg/dL (ref 1.7–2.4)

## 2018-02-11 LAB — HEPARIN LEVEL (UNFRACTIONATED): HEPARIN UNFRACTIONATED: 0.32 [IU]/mL (ref 0.30–0.70)

## 2018-02-11 MED ORDER — K PHOS MONO-SOD PHOS DI & MONO 155-852-130 MG PO TABS
500.0000 mg | ORAL_TABLET | Freq: Three times a day (TID) | ORAL | Status: AC
  Administered 2018-02-11 (×3): 500 mg via ORAL
  Filled 2018-02-11 (×3): qty 2

## 2018-02-11 MED ORDER — POTASSIUM PHOSPHATE MONOBASIC 500 MG PO TABS
500.0000 mg | ORAL_TABLET | Freq: Three times a day (TID) | ORAL | Status: DC
  Filled 2018-02-11: qty 1

## 2018-02-11 MED ORDER — LURASIDONE HCL 40 MG PO TABS
80.0000 mg | ORAL_TABLET | Freq: Two times a day (BID) | ORAL | Status: DC
  Filled 2018-02-11: qty 2

## 2018-02-11 MED ORDER — MAGNESIUM SULFATE 2 GM/50ML IV SOLN
2.0000 g | Freq: Once | INTRAVENOUS | Status: AC
  Administered 2018-02-11: 2 g via INTRAVENOUS
  Filled 2018-02-11: qty 50

## 2018-02-11 MED ORDER — MAGNESIUM OXIDE 400 (241.3 MG) MG PO TABS
200.0000 mg | ORAL_TABLET | Freq: Two times a day (BID) | ORAL | Status: DC
  Administered 2018-02-11 – 2018-02-12 (×3): 200 mg via ORAL
  Filled 2018-02-11 (×3): qty 1

## 2018-02-11 MED ORDER — BENZTROPINE MESYLATE 0.5 MG PO TABS
0.5000 mg | ORAL_TABLET | Freq: Two times a day (BID) | ORAL | Status: DC
  Administered 2018-02-11 – 2018-02-13 (×4): 0.5 mg via ORAL
  Filled 2018-02-11 (×4): qty 1

## 2018-02-11 MED ORDER — POTASSIUM CHLORIDE 20 MEQ/15ML (10%) PO SOLN
40.0000 meq | Freq: Every day | ORAL | Status: DC
Start: 2018-02-11 — End: 2018-02-13
  Administered 2018-02-11 – 2018-02-12 (×2): 40 meq via ORAL
  Filled 2018-02-11 (×2): qty 30

## 2018-02-11 MED ORDER — BENZTROPINE MESYLATE 0.5 MG PO TABS: 1.0000 mg | ORAL_TABLET | Freq: Two times a day (BID) | ORAL | Status: DC

## 2018-02-11 MED ORDER — WARFARIN SODIUM 5 MG PO TABS
5.0000 mg | ORAL_TABLET | Freq: Once | ORAL | Status: AC
  Administered 2018-02-11: 5 mg via ORAL
  Filled 2018-02-11: qty 1

## 2018-02-11 MED ORDER — LURASIDONE HCL 40 MG PO TABS
40.0000 mg | ORAL_TABLET | Freq: Two times a day (BID) | ORAL | Status: DC
  Administered 2018-02-11 – 2018-02-13 (×5): 40 mg via ORAL
  Filled 2018-02-11 (×6): qty 1

## 2018-02-11 NOTE — Progress Notes (Signed)
STROKE TEAM PROGRESS NOTE   SUBJECTIVE (INTERVAL HISTORY) The patient's nurse is at the bedside.  The patient sitting in bed, talkative, slurry speech at baseline. No neuro changes. Still same left pupil mildly bigger than right both under light and in dark. CTA head and neck no significant findings.    OBJECTIVE Temp:  [98.2 F (36.8 C)-99 F (37.2 C)] 99 F (37.2 C) (04/14 0322) Pulse Rate:  [57-66] 57 (04/14 0322) Cardiac Rhythm: Normal sinus rhythm (04/14 0700) Resp:  [15-20] 16 (04/14 0322) BP: (111-129)/(46-82) 129/65 (04/14 0322) SpO2:  [96 %-100 %] 96 % (04/14 0322)  CBC:  Recent Labs  Lab 02/10/18 0224 02/11/18 0407  WBC 5.9 7.6  HGB 9.9* 10.5*  HCT 30.8* 31.7*  MCV 95.7 94.3  PLT 241 053    Basic Metabolic Panel:  Recent Labs  Lab 02/10/18 1548 02/10/18 2053 02/11/18 0407  NA 135  --  138  K 3.6  --  3.7  CL 108  --  107  CO2 21*  --  22  GLUCOSE 192*  --  218*  BUN 10  --  14  CREATININE 0.67  --  0.82  CALCIUM 7.4*  --  7.7*  MG 1.4*  --  2.2  PHOS  --  2.1* 2.3*    Lipid Panel:     Component Value Date/Time   TRIG 130 02/02/2018 1059   HgbA1c:  Lab Results  Component Value Date   HGBA1C 5.5 01/27/2018   Urine Drug Screen:     Component Value Date/Time   COCAINSCRNUR Negative 01/27/2018 1636   LABBARB Negative 01/27/2018 1636    Alcohol Level No results found for: [place] I have personally reviewed the radiological images below and agree with the radiology interpretations.   Mr Brain Wo Contrast 02/10/2018 IMPRESSION:  1. No acute intracranial process on this motion degraded examination.  2. Borderline parenchymal brain volume loss and mild chronic small vessel ischemic disease.   Ct Head Code Stroke Wo Contrast 02/09/2018 IMPRESSION:  1. No acute intracranial abnormality or significant interval change.  2. Stable mild atrophy and white matter disease.  3. ASPECTS is 10/10   Transthoracic Echocardiogram  01/28/2018 Study  Conclusions - Left ventricle: The cavity size was normal. Wall thickness was   increased in a pattern of mild LVH. Systolic function was normal.   The estimated ejection fraction was in the range of 55% to 60%.   Wall motion was normal; there were no regional wall motion   abnormalities. Features are consistent with a pseudonormal left   ventricular filling pattern, with concomitant abnormal relaxation   and increased filling pressure (grade 2 diastolic dysfunction). - Aortic valve: The aortic valve is markedly abnormal. Appears   trileaflet but I cannot see the left coronary cusp well. The   non-coronary cusp is markedly thickened and calcified. The right   coronary cusp may be fused. There is significant turbulence over   the valve with mild stenosis. Would strongly consider TEE. There   was trivial regurgitation. Valve area (VTI): 1.66 cm^2. Valve   area (Vmax): 1.66 cm^2. Valve area (Vmean): 1.72 cm^2. - Mitral valve: Calcified annulus. - Left atrium: The atrium was moderately dilated. - Atrial septum: There was increased thickness of the septum,   consistent with lipomatous hypertrophy. - Impressions: Abnormal aortic valve as described above. Also with   signifcant MAC for her age. Would strongly consider TEE. Impressions: - Abnormal aortic valve as described above. Also with  signifcant   MAC for her age. Would strongly consider TEE.  CTA Head and Neck 02/11/2018 IMPRESSION: 1. Mild atherosclerosis with no hemodynamically significant arterial stenosis in the head or neck. Negative for dissection or intracranial aneurysm. 2. Multiple arterial anatomic variations, most notably right side aortic arch with abdomen and left subclavian artery. 3. No acute intracranial abnormality. Stable and negative noncontrast CT appearance of the brain. 4. 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 Consider annual imaging followup by  CTA or MRA.      PHYSICAL EXAM Vitals:   02/10/18 1600 02/10/18 1954 02/10/18 2343 02/11/18 0322  BP: (!) 123/46 (!) 111/55 (!) 128/56 129/65  Pulse: (!) 59 (!) 57 63 (!) 57  Resp: _0 Temp:  98.3 F (36.8 C) 98.2 F (36.8 C) 99 F (37.2 C)  TempSrc:  Oral Oral Oral  SpO2: 100% 98% 99% 96%  Weight:      Height:        General - pleasant 57 year old female in bed in no acute distress. Heart - Regular rate and rhythm -2/6 soft systolic murmur. Lungs - Clear to auscultation anteriorly Abdomen - Soft - non tender Extremities - Distal pulses weak to absent - no edema although the patient is wearing SCDs. Skin - Warm and dry  Mental Status: She is alert and follows most commands.  Her speech is difficult to understand. Cranial Nerves: II: Discs not visualized; left pupil approximately 5 mm and right pupil approximately 4 mm in the dark.  With light, both briskly reactive, left pupil down to 2.14m, right pupil down to 255m  III,IV, VI: ptosis not present, extra-ocular motions intact bilaterally V,VII: smile symmetric, facial light touch sensation normal bilaterally VIII: hearing normal bilaterally IX,X: gag reflex present XI: bilateral shoulder shrug intact. XII: midline tongue extension Motor: Right : Upper extremity   5/5    Left:     Upper extremity   5/5  Lower extremity   5/5     Lower extremity   5/5 Tone and bulk:normal tone throughout; no atrophy noted Sensory: Light touch intact throughout, bilaterally Cerebellar: Mild intentional tremor on right FTN testing. Right FTN intact.  Lower extremities not tested. Gait: not tested    ASSESSMENT/PLAN Ms. PaTIEARRA COLWELLs a [age].o. female with history of seizures, mentally slow, hypertension, hyperlipidemia, dysphagia, diabetes, anxiety, schizophrenia, presenting with new onset atrial fibrillation, sepsis, ARDS, altered mental status, unequal pupil size with possible mild right-sided weakness. She did not receive IV  t-PA due to anticoagulation.  anisocoria - parallel reaction under light and dark, most likely physiological anisocoria. CTA head and neck has ruled out pancoast tumor, ICA dissection, or aneurysms. MRI no brainstem infarct.  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  2D Echo - 01/28/18  - EF 55-60%, abnormal AV and MAC.  TEE was recommended but as per primary team.  LDL - not performed  HgbA1c - 5.5  VTE prophylaxis - SCDs -IV heparin -Coumadin DIET - DYS 1 Room service appropriate? Yes; Fluid consistency: Honey Thick  aspirin 325 mg daily and warfarin daily prior to admission, now on heparin IV and warfarin daily for afib management  Ongoing aggressive stroke risk factor management  Therapy recommendations:  SNF  Disposition:  Pending  New diagnosed afib  Cardiology on board  MRI no acute infarct  On heparin bridge to coumadin  INR today 1.64  Hypertension  Stable . Long-term BP goal normotensive  Other Stroke Risk Factors  Obesity, Body mass index is 29.07 kg/m., recommend weight loss, diet and exercise as appropriate   Other Active Problems  Hypokalemia   ARDS - as per primary team  Seizure - on keppra  Schizophrenia - on cogentin and seroquel  Neurology will sign off. Please call with questions. No neuro follow up needed at this time. Thanks for the consult.  Rosalin Hawking, MD PhD Stroke Neurology 02/11/2018 2:11 PM   To contact Stroke Continuity provider, please refer to [email or web address] After hours, contact General Neurology

## 2018-02-11 NOTE — Progress Notes (Signed)
QTc 642. Attending notified.   [place]  Milania Haubner, RN

## 2018-02-11 NOTE — Progress Notes (Signed)
Bladder scanned; max of 45mL.   [place]  Kerri Kovacik, RN

## 2018-02-11 NOTE — Progress Notes (Signed)
QTc on monitor now 478. Will continue to monitor.   [place]  Azalie Harbeck, RN

## 2018-02-11 NOTE — Consult Note (Signed)
[facility] Consult   Reason for Consult:  ''needs help adjust psych medication due to prolong QT'' Referring Physician:  Dr. Tyrell Antonio Patient Identification: Natasha Martin MRN:  [id number] Principal Diagnosis: Schizophrenia [facility]) Diagnosis:   Patient Active Problem List   Diagnosis Date Noted  . Acute respiratory failure ([place]) [J96.00] 01/27/2018  . ARDS (adult respiratory distress syndrome) ([place]) [J80] 01/27/2018  . Pain in joint, shoulder region [M25.519] 07/23/2015  . GERD (gastroesophageal reflux disease) [K21.9] 07/23/2015  . Hyponatremia secondary to SA IDH [E87.1] 02/18/2015  . Hypokalemia [E87.6] 02/18/2015  . Hypophosphatemia [E83.39] 02/18/2015  . Elevated d-dimer [R79.89] 02/18/2015  . Schizophrenia ([place]) [F20.9] 02/18/2015  . Atrial fibrillation ([place]) [I48.91]   . Atrial flutter with rapid ventricular response ([place]) [I48.92] 02/13/2015  . Ileus ([place]) [K56.7]   . Acute ischemic colitis ([place]) [K55.039]   . Atrial fibrillation with RVR ([place]) [I48.91] 02/11/2015  . GI bleed [K92.2] 02/11/2015  . Abdominal distention [R14.0] 02/11/2015  . Noninfectious gastroenteritis and colitis [K52.9] 02/11/2015  . Diabetes mellitus type 2 in nonobese ([place]) [E11.9] 02/11/2015  . Acute encephalopathy [G93.40] 02/11/2015  . Dysphagia, unspecified(787.20) [R13.10] 01/13/2014  . Rectal bleeding [K62.5] 01/13/2014    Total Time spent with patient: 45 minutes  Subjective:   Natasha Martin is a 57 y.o. female patient admitted with hypoxia  HPI:  Patient is a  57 year old woman with history of Schizophrenia, Diabetes and seizure disorder who was brought to the hospital for treatment after she was found in the bathroom  unresponsive. She was taking to [facility] where she was  intubated and transferred to [facility] for further treatment. Patient denies psychosis or delusional thinking. She was found to have prolong QT during this admission and as a result Seroquel and  Lurasidone were held for fear of further worsening QT interval.   Past Psychiatric History: as above  Risk to Self: Is patient at risk for suicide?: No Risk to Others:   Prior Inpatient Therapy:   Prior Outpatient Therapy:    Past Medical History:  Past Medical History:  Diagnosis Date  . Anxiety   . Diabetes ([place])   . Dysphagia   . Hyperlipidemia   . Hypertension   . Mild mental slowing   . Poor historian   . Schizophrenia ([place])   . Seizures ([place])    has not had a seizure since last year. Unknown orgin- no family available to come and patient does not know.    Past Surgical History:  Procedure Laterality Date  . BIOPSY  01/28/2014   Procedure: BIOPSY (Gastric);  Surgeon: Danie Binder, MD;  Location: [facility];  Service: Endoscopy;;  . COLONOSCOPY WITH PROPOFOL N/A 01/28/2014   LZJ:QBHALP mucosa in the terminal ileum/small internal and external hemorrhoids  . ESOPHAGOGASTRODUODENOSCOPY (EGD) WITH PROPOFOL N/A 01/28/2014   FXT:KWIOXB esophagitis/stricture at the gastro junction/moderate erosive  . 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 POLYPECTOMY  01/28/2014   Procedure: POLYPECTOMY (Rectal);  Surgeon: Danie Binder, MD;  Location: [facility];  Service: Endoscopy;;  . SAVORY DILATION N/A 01/28/2014   Procedure: SAVORY DILATION  (12.86m to 126m;  Surgeon: SaDanie BinderMD;  Location: [facility];  Service: Endoscopy;  Laterality: N/A;   Family History:  Family History  Problem Relation Age of Onset  . Colon cancer Unknown        unknown   Family Psychiatric  History:  Social History:  Social History   Substance and Sexual Activity  Alcohol Use No   Comment: history of ETOH abuse     Social History   Substance and Sexual Activity  Drug Use No    Social History   Socioeconomic History  . Marital status: Single    Spouse name: Not on file  . Number of children: Not on file  . Years of education: Not on file  . Highest education  level: Not on file  Occupational History  . Not on file  Social Needs  . Financial resource strain: Not on file  . Food insecurity:    Worry: Not on file    Inability: Not on file  . Transportation needs:    Medical: Not on file    Non-medical: Not on file  Tobacco Use  . Smoking status: Never Smoker  . Smokeless tobacco: Never Used  Substance and Sexual Activity  . Alcohol use: No    Comment: history of ETOH abuse  . Drug use: No  . Sexual activity: Never    Birth control/protection: None  Lifestyle  . Physical activity:    Days per week: Not on file    Minutes per session: Not on file  . Stress: Not on file  Relationships  . Social connections:    Talks on phone: Not on file    Gets together: Not on file    Attends religious service: Not on file    Active member of club or organization: Not on file    Attends meetings of clubs or organizations: Not on file    Relationship status: Not on file  Other Topics Concern  . Not on file  Social History Narrative  . Not on file   Additional Social History:    Allergies:   Allergies  Allergen Reactions  . Sulfa Antibiotics Rash    Labs:  Results for orders placed or performed during the hospital encounter of 01/27/18 (from the past 48 hour(s))  Heparin level (unfractionated)     Status: None   Collection Time: 02/09/18  4:43 PM  Result Value Ref Range   Heparin Unfractionated 0.38 0.30 - 0.70 IU/mL    Comment:        IF HEPARIN RESULTS ARE BELOW EXPECTED VALUES, AND PATIENT DOSAGE HAS BEEN CONFIRMED, SUGGEST FOLLOW UP TESTING OF ANTITHROMBIN III LEVELS. Performed at [facility], [place] [address], [place], [place] [address]   Glucose, capillary     Status: Abnormal   Collection Time: 02/09/18  5:33 PM  Result Value Ref Range   Glucose-Capillary 162 (H) 65 - 99 mg/dL   Comment 1 Notify RN    Comment 2 Document in Chart   Glucose, capillary     Status: Abnormal   Collection Time: 02/09/18  9:53 PM  Result  Value Ref Range   Glucose-Capillary 236 (H) 65 - 99 mg/dL   Comment 1 Notify RN   Heparin level (unfractionated)     Status: None   Collection Time: 02/10/18  2:24 AM  Result Value Ref Range   Heparin Unfractionated 0.52 0.30 - 0.70 IU/mL    Comment:        IF HEPARIN RESULTS ARE BELOW EXPECTED VALUES, AND PATIENT DOSAGE HAS BEEN CONFIRMED, SUGGEST FOLLOW UP TESTING OF ANTITHROMBIN III LEVELS. Performed at [facility], [place] [address] [place], [place] [address]   CBC     Status: Abnormal   Collection Time: 02/10/18  2:24 AM  Result Value  Ref Range   WBC 5.9 4.0 - 10.5 K/uL   RBC 3.22 (L) 3.87 - 5.11 MIL/uL   Hemoglobin 9.9 (L) 12.0 - 15.0 g/dL   HCT 30.8 (L) 36.0 - 46.0 %   MCV 95.7 78.0 - 100.0 fL   MCH 30.7 26.0 - 34.0 pg   MCHC 32.1 30.0 - 36.0 g/dL   RDW 14.0 11.5 - 15.5 %   Platelets 241 150 - 400 K/uL    Comment: Performed at [facility] [address] [place], [place] [address]  Protime-INR     Status: Abnormal   Collection Time: 02/10/18  2:24 AM  Result Value Ref Range   Prothrombin Time 20.6 (H) 11.4 - 15.2 seconds   INR 1.78     Comment: Performed at [facility] [address] [place], [place] [address]  Glucose, capillary     Status: Abnormal   Collection Time: 02/10/18  7:57 AM  Result Value Ref Range   Glucose-Capillary 160 (H) 65 - 99 mg/dL   Comment 1 Notify RN    Comment 2 Document in Chart   Basic metabolic panel     Status: Abnormal   Collection Time: 02/10/18  8:14 AM  Result Value Ref Range   Sodium 137 135 - 145 mmol/L   Potassium 2.6 (LL) 3.5 - 5.1 mmol/L    Comment: CRITICAL RESULT CALLED TO, READ BACK BY AND VERIFIED WITH: G.HAHLE RN @ [phone] 02/10/18 BY C.EDENS    Chloride 105 101 - 111 mmol/L   CO2 23 22 - 32 mmol/L   Glucose, Bld 195 (H) 65 - 99 mg/dL   BUN 11 6 - 20 mg/dL   Creatinine, Ser 0.73 0.44 - 1.00 mg/dL   Calcium 7.6 (L) 8.9 - 10.3 mg/dL   GFR calc non Af Amer >60 >60 mL/min   GFR calc Af Amer >60 >60  mL/min    Comment: (NOTE) The eGFR has been calculated using the CKD EPI equation. This calculation has not been validated in all clinical situations. eGFR's persistently <60 mL/min signify possible Chronic Kidney Disease.    Anion gap 9 5 - 15    Comment: Performed at [facility] [address] [place], [place] [address]  Glucose, capillary     Status: Abnormal   Collection Time: 02/10/18 12:16 PM  Result Value Ref Range   Glucose-Capillary 263 (H) 65 - 99 mg/dL   Comment 1 Notify RN    Comment 2 Document in Chart   Magnesium     Status: Abnormal   Collection Time: 02/10/18  3:48 PM  Result Value Ref Range   Magnesium 1.4 (L) 1.7 - 2.4 mg/dL    Comment: Performed at [facility] [address] [place], [place] [address]  Basic metabolic panel     Status: Abnormal   Collection Time: 02/10/18  3:48 PM  Result Value Ref Range   Sodium 135 135 - 145 mmol/L   Potassium 3.6 3.5 - 5.1 mmol/L    Comment: DELTA CHECK NOTED   Chloride 108 101 - 111 mmol/L   CO2 21 (L) 22 - 32 mmol/L   Glucose, Bld 192 (H) 65 - 99 mg/dL   BUN 10 6 - 20 mg/dL   Creatinine, Ser 0.67 0.44 - 1.00 mg/dL   Calcium 7.4 (L) 8.9 - 10.3 mg/dL   GFR calc non Af Amer >60 >60 mL/min   GFR calc Af Amer >60 >60 mL/min  Comment: (NOTE) The eGFR has been calculated using the CKD EPI equation. This calculation has not been validated in all clinical situations. eGFR's persistently <60 mL/min signify possible Chronic Kidney Disease.    Anion gap 6 5 - 15    Comment: Performed at [facility] [address] [place], [place] [address]  Glucose, capillary     Status: Abnormal   Collection Time: 02/10/18  5:25 PM  Result Value Ref Range   Glucose-Capillary 188 (H) 65 - 99 mg/dL   Comment 1 Notify RN    Comment 2 Document in Chart   Phosphorus     Status: Abnormal   Collection Time: 02/10/18  8:53 PM  Result Value Ref Range   Phosphorus 2.1 (L) 2.5 - 4.6 mg/dL    Comment: Performed at  [facility] [address], [place], [place] [address]  Glucose, capillary     Status: Abnormal   Collection Time: 02/10/18  9:39 PM  Result Value Ref Range   Glucose-Capillary 274 (H) 65 - 99 mg/dL   Comment 1 Notify RN    Comment 2 Document in Chart   C difficile quick scan w PCR reflex     Status: None   Collection Time: 02/10/18 10:07 PM  Result Value Ref Range   C Diff antigen NEGATIVE NEGATIVE   C Diff toxin NEGATIVE NEGATIVE   C Diff interpretation No C. difficile detected.     Comment: Performed at [facility], [place] [address], [place], [place] [address]  Heparin level (unfractionated)     Status: None   Collection Time: 02/11/18  4:07 AM  Result Value Ref Range   Heparin Unfractionated 0.32 0.30 - 0.70 IU/mL    Comment:        IF HEPARIN RESULTS ARE BELOW EXPECTED VALUES, AND PATIENT DOSAGE HAS BEEN CONFIRMED, SUGGEST FOLLOW UP TESTING OF ANTITHROMBIN III LEVELS. Performed at [facility], [place] [address], [place], [place] [address]   CBC     Status: Abnormal   Collection Time: 02/11/18  4:07 AM  Result Value Ref Range   WBC 7.6 4.0 - 10.5 K/uL   RBC 3.36 (L) 3.87 - 5.11 MIL/uL   Hemoglobin 10.5 (L) 12.0 - 15.0 g/dL   HCT 31.7 (L) 36.0 - 46.0 %   MCV 94.3 78.0 - 100.0 fL   MCH 31.3 26.0 - 34.0 pg   MCHC 33.1 30.0 - 36.0 g/dL   RDW 14.1 11.5 - 15.5 %   Platelets 266 150 - 400 K/uL    Comment: Performed at [facility], [place] [address] [place], [place] [address]  Protime-INR     Status: Abnormal   Collection Time: 02/11/18  4:07 AM  Result Value Ref Range   Prothrombin Time 19.2 (H) 11.4 - 15.2 seconds   INR 1.64     Comment: Performed at [facility] [address] [place], [place] [address]  Basic metabolic panel     Status: Abnormal   Collection Time: 02/11/18  4:07 AM  Result Value Ref Range   Sodium 138 135 - 145 mmol/L   Potassium 3.7 3.5 - 5.1 mmol/L   Chloride 107 101 - 111 mmol/L   CO2 22 22 - 32 mmol/L   Glucose,  Bld 218 (H) 65 - 99 mg/dL   BUN 14 6 - 20 mg/dL   Creatinine, Ser 0.82 0.44 - 1.00 mg/dL   Calcium 7.7 (L) 8.9 - 10.3 mg/dL  GFR calc non Af Amer >60 >60 mL/min   GFR calc Af Amer >60 >60 mL/min    Comment: (NOTE) The eGFR has been calculated using the CKD EPI equation. This calculation has not been validated in all clinical situations. eGFR's persistently <60 mL/min signify possible Chronic Kidney Disease.    Anion gap 9 5 - 15    Comment: Performed at [facility] [address] [place], [place] [address]  Magnesium     Status: None   Collection Time: 02/11/18  4:07 AM  Result Value Ref Range   Magnesium 2.2 1.7 - 2.4 mg/dL    Comment: Performed at [facility] [address] [place], [place] [address]  Phosphorus     Status: Abnormal   Collection Time: 02/11/18  4:07 AM  Result Value Ref Range   Phosphorus 2.3 (L) 2.5 - 4.6 mg/dL    Comment: Performed at [facility] [address] [place], [place] [address]  Glucose, capillary     Status: Abnormal   Collection Time: 02/11/18  8:17 AM  Result Value Ref Range   Glucose-Capillary 161 (H) 65 - 99 mg/dL   Comment 1 Notify RN    Comment 2 Document in Chart   Albumin     Status: Abnormal   Collection Time: 02/11/18  8:23 AM  Result Value Ref Range   Albumin 2.5 (L) 3.5 - 5.0 g/dL    Comment: Performed at [facility], [place] [address] [place], [place] [address]  Glucose, capillary     Status: Abnormal   Collection Time: 02/11/18 12:46 PM  Result Value Ref Range   Glucose-Capillary 148 (H) 65 - 99 mg/dL   Comment 1 Notify RN    Comment 2 Document in Chart     Current Facility-Administered Medications  Medication Dose Route Frequency Provider Last Rate Last Dose  . 0.9 %  sodium chloride infusion  250 mL Intravenous PRN Maryellen Pile, MD   Stopped at 02/08/18 1142  . acetaminophen (TYLENOL) tablet 650 mg  650 mg Oral Q6H PRN Regalado, Belkys A, MD   650 mg at 02/08/18 2311  .  Ampicillin-Sulbactam (UNASYN) 3 g in sodium chloride 0.9 % 100 mL IVPB  3 g Intravenous Q6H Pierce, Dwayne A, RPH   Stopped at 02/11/18 1052  . benztropine (COGENTIN) tablet 1 mg  1 mg Oral BID Maryalyce Sanjuan, MD      . chlorhexidine (PERIDEX) 0.12 % solution 15 mL  15 mL Mouth Rinse BID Maryellen Pile, MD   15 mL at 02/11/18 1022  . diazepam (VALIUM) tablet 2 mg  2 mg Oral Q12H PRN Regalado, Belkys A, MD   2 mg at 02/10/18 1237  . heparin ADULT infusion 100 units/mL (25000 units/264m sodium chloride 0.45%)  1,300 Units/hr Intravenous Continuous LErenest Blank RPH 13 mL/hr at 02/10/18 0534 1,300 Units/hr at 02/10/18 0534  . hydrALAZINE (APRESOLINE) injection 10 mg  10 mg Intravenous Q8H PRN Regalado, Belkys A, MD      . insulin aspart (novoLOG) injection 0-15 Units  0-15 Units Subcutaneous TID WC Regalado, Belkys A, MD   2 Units at 02/11/18 1300  . insulin aspart (novoLOG) injection 0-5 Units  0-5 Units Subcutaneous QHS Regalado, Belkys A, MD   3 Units at 02/10/18 2144  . insulin glargine (LANTUS) injection 30 Units  30 Units Subcutaneous Daily BMaryellen Pile MD   30 Units at 02/11/18 1024  . ipratropium-albuterol (DUONEB) 0.5-2.5 (3) MG/3ML nebulizer  solution 3 mL  3 mL Nebulization Q4H PRN Maryellen Pile, MD      . levETIRAcetam (KEPPRA) tablet 1,000 mg  1,000 mg Oral BID Regalado, Belkys A, MD   1,000 mg at 02/11/18 0813  . lurasidone (LATUDA) tablet 80 mg  80 mg Oral BID Lane Kjos, MD      . magnesium hydroxide (MILK OF MAGNESIA) suspension 5 mL  5 mL Per Tube Daily PRN Maryellen Pile, MD      . magnesium oxide (MAG-OX) tablet 200 mg  200 mg Oral BID Regalado, Belkys A, MD   200 mg at 02/11/18 1023  . MEDLINE mouth rinse  15 mL Mouth Rinse q12n4p Maryellen Pile, MD   15 mL at 02/11/18 1301  . metoprolol succinate (TOPROL-XL) 24 hr tablet 50 mg  50 mg Oral Daily Regalado, Belkys A, MD   50 mg at 02/11/18 1023  . mupirocin cream (BACTROBAN) 2 %   Topical Daily Maryellen Pile, MD    1 application at 60/10/93 1025  . pantoprazole (PROTONIX) EC tablet 40 mg  40 mg Oral QHS Regalado, Belkys A, MD   40 mg at 02/10/18 2145  . phosphorus (K PHOS NEUTRAL) tablet 500 mg  500 mg Oral TID WC Regalado, Belkys A, MD   500 mg at 02/11/18 1229  . Racepinephrine HCl 2.25 % nebulizer solution 0.5 mL  0.5 mL Nebulization Q4H PRN Maryellen Pile, MD   0.5 mL at 02/06/18 0953  . RESOURCE THICKENUP CLEAR   Oral PRN Regalado, Belkys A, MD      . sodium chloride flush (NS) 0.9 % injection 10-40 mL  10-40 mL Intracatheter PRN Maryellen Pile, MD   10 mL at 02/03/18 0914  . warfarin (COUMADIN) tablet 5 mg  5 mg Oral ONCE-1800 Lyndee Leo, [facility]      . Warfarin - Pharmacist Dosing Inpatient   Does not apply q1800 Romona Curls, [facility]   1 each at 02/08/18 2355    Musculoskeletal: Strength & Muscle Tone: within normal limits Gait & Station: not tested Patient leans: N/A  Psychiatric Specialty Exam: Physical Exam  Psychiatric: Judgment and thought content normal. Her mood appears anxious. Her speech is delayed. She is slowed and withdrawn. Cognition and memory are normal.    Review of Systems  Unable to perform ROS: Acuity of condition    Blood pressure 137/69, pulse 66, temperature 98.5 F (36.9 C), temperature source Oral, resp. rate 16, height _0  (1.575 m), weight 70.6 kg (155 lb 10.3 oz), last menstrual period 01/21/2014, SpO2 100 %.Body mass index is 28.47 kg/m.  General Appearance: Casual  Eye Contact:  Fair  Speech:  Slurred  Volume:  Decreased  Mood:  Dysphoric  Affect:  Constricted  Thought Process:  Coherent  Orientation:  Other:  only to place and person  Thought Content:  Logical  Suicidal Thoughts:  No  Homicidal Thoughts:  No  Memory:  not tested, patient still appears drowsy  Judgement:  Intact  Insight:  Fair  Psychomotor Activity:  Psychomotor Retardation  Concentration:  Concentration: Fair and Attention Span: Fair  Recall:  [employer] of Knowledge:  Fair   Language:  Fair  Akathisia:  No  Handed:  Right  AIMS (if indicated):     Assets:  Desire for Improvement Social Support  ADL's:  Intact  Cognition:  WNL  Sleep:   fair     Treatment Plan Summary: Patient takes High dose of Seroquel and Latuda for Schizophrenia.  Both medication were stopped due to prolonged QT 484/499 on EKG. However, patient will need her  medications to be re-introduced gradually to avoid psychotic decompensation.  Recommendations: -Hold Seroquel for now, decreased Cogentin to 0.37m twice daily. -Start Latuda 40 mg twice daily for schizophrenia. -Monitor QT interval on EKG. May increase Latuda to 80 mg bid  in the next 3-5 days if QT is trending down. -Re-consult psych as needed.    Disposition: Refer patient to her outpatient psychiatrist upon discharge  ACorena Pilgrim MD 02/11/2018 2:48 PM

## 2018-02-11 NOTE — Progress Notes (Addendum)
ANTICOAGULATION CONSULT NOTE - Follow Up Consult  Pharmacy Consult for heparin > warfarin Indication: atrial fibrillation  Allergies  Allergen Reactions  . Sulfa Antibiotics Rash    Patient Measurements: Height: 5\' 2"  (157.5 cm) Weight: 159 lb 9.8 oz (72.4 kg) IBW/kg (Calculated) : 50.1 Heparin Dosing Weight: 68 kg  Vital Signs: Temp: 97.9 F (36.6 C) (04/12 1214) Temp Source: Oral (04/12 1214) BP: 145/64 (04/12 1214) Pulse Rate: 71 (04/12 0922)  Labs: Recent Labs    02/07/18 0206  02/07/18 1538 02/08/18 0220 02/08/18 0723 02/09/18 0351 02/09/18 1315  HGB 9.8*  --   --  10.4*  --  9.1*  --   HCT 31.3*  --   --  33.2*  --  29.7*  --   PLT 409*  --   --  433*  --  282  --   LABPROT  --   --  15.3* 15.0  --  18.0*  --   INR  --   --  1.22 1.19  --  1.50  --   HEPARINUNFRC 0.89*   < >  --  0.56  --  0.86* 0.27*  CREATININE 0.99  --   --   --  1.06* 0.96  --    < > = values in this interval not displayed.    Estimated Creatinine Clearance: 61.7 mL/min (by C-G formula based on SCr of 0.96 mg/dL).  Assessment: [age] yoF with a history of AFib on warfarin PTA found unresponsive at home. Pharmacy consulted to start IV heparin while holding oral AC (previously failed swallow evaluation this admit). Warfarin now resumed 4/9.   INR down this am to 1.6. Hgb stable, plt wnl. No bleeding documented. Noted resumed amiodarone from PTA 4/11, stopped 4/13 d/t prolonged qtc.  Heparin continues to be therapeutic on 1300 units/hr.    Home warfarin dose: 2mg  daily except 3mg  on Sat and Sun  Goal of Therapy:  Heparin level 0.3-0.7 units/ml  INR 2-3 Monitor platelets by anticoagulation protocol: Yes   Plan:  -Continue heparin IV at 1300 units/hr  - Daily hep lvl cbc -Warfarin 5mg  tonight  Sheppard CoilFrank Aaro Meyers PharmD., BCPS Clinical Pharmacist 02/11/2018 9:54 AM

## 2018-02-11 NOTE — Progress Notes (Signed)
Paged [place]o about >500 QTc (per room monitor) --EKG states QTc just below 500; confirmed whether to give or hold Cogentin (as pt already had morning dose) & Latuda. Will continue to monitor.   [place]  Mickie Badders, RN

## 2018-02-11 NOTE — Progress Notes (Signed)
Received return call from C. Bodenheimer, NP.  Order received to supplement potassium and then OK to give latuda and cogentin.

## 2018-02-11 NOTE — Progress Notes (Signed)
PROGRESS NOTE    Natasha Martin  EXH:[id number] DOB: 03-31-1961 DOA: 01/27/2018 PCP: Neale Burly, MD   Brief Narrative: this 57 year old with diabetes felt mental delay and schizophrenia who also has a reported history of seizures in the chart. For the past 2-3 days she has been having a productive cough and she was found in the bathroom this morning unresponsive. When EMS arrived her saturations were in the 20s. She was taking to [facility] where she was intubated and chest x-ray has shown diffuse bilateral infiltrates.Admitted3/30/2019  Significant Events: 01/27/2018: Transferred from [facility]; intubated; CXR with bilateral infiltrates 01/28/2018: ARDS protocol; Nimbex started 01/30/2018: Nimbex stopped; required propofol > drop in BP and levo started 01/31/2018: Bronchoscopy (cultures with yeast only) 02/05/2018: Extubated, A-line out  Assessment & Plan:   Principal Problem:   Schizophrenia ([place]) Active Problems:   Acute respiratory failure ([place])   ARDS (adult respiratory distress syndrome) ([place])  Acute resp failure 2/2 ARDS likely 2/2 aspiration event.  Extubated 4-08. Now on 1 L oxygen.  Received 7 days of antibiotics.  Hold lasix. Taper off solumedrol.  Fevers; 4-11. Cover for aspiration; now afebrile.  Pan-culture. blood culture 4-11; no growth to date. . Chest x ray improved  Unasyn to cover for aspiration. Started 4-11   Prolong Qt ; Magnesium was replaced. Replace  Hold Seroquel  And latuda. Psych consulted again to help adjust medications. .  QT decreased today/   Acute encephalopathy, AMS;  On 4-11 she was more lethargic, had fever 103. Neuro exam was limited due to AMS. She was off of her Psych med.  4-12;  right Upper extremity weakness and left pupil more dilated than right.  On heparin for A fib.  Neuro consulted. Appreciate help. MRI negative for stroke. CT head negative.  Plan to get CT angio head neck due to anisocoria. CT angio head and neck no  significant finding.   A fib;  Resume lopressor.  On heparin Gtt.  Started  Coumadin.  Cardiology recommended holding amiodarone.   HTN;  Resume metoprolol. Daily dose.  Hydralazine PRN.   Septic shock; Off levophed.  Treated with IV antibiotics.  C diff negative.   Aortic and mitral valve diseases;  Cardiology was consulted, no work up during this admission.   Anemia;  Hb at 3, on admission, probably not accurate.  Hb now at 9. Follow trend.   DM On lantus and SSI.   Mild mental delay.  B 12 731. , TSH 0.8.   Seizure. ?  She was stared on  keppra on admission. Per sister patient was not taking ; carbamazepine.   Hypernatremia; hold lasix. Encourage oral intake. Resolved. Stop D 5 IV   Schizophrenia, depression;  Appreciate Psych;  Continue cogentin.  Due to prolong QT , latuda and Seroquel are on hold. Psych consulted to help adjusting medications.  Hypokalemia; replaced.  Hypophosphatemia; replace orally.   DVT prophylaxis: heparin.  Code Status: full code.  Family Communication: no family at bedside.  Disposition Plan: PT eval.    Consultants:   CCM   Procedures:  Left ventricle: The cavity size was normal. Wall thickness was   increased in a pattern of mild LVH. Systolic function was normal.   The estimated ejection fraction was in the range of 55% to 60%.   Wall motion was normal; there were no regional wall motion   abnormalities. Features are consistent with a pseudonormal left   ventricular filling pattern, with concomitant abnormal relaxation  and increased filling pressure (grade 2 diastolic dysfunction). - Aortic valve: The aortic valve is markedly abnormal. Appears   trileaflet but I cannot see the left coronary cusp well. The   non-coronary cusp is markedly thickened and calcified. The right   coronary cusp may be fused. There is significant turbulence over   the valve with mild stenosis. Would strongly consider TEE. There   was  trivial regurgitation. Valve area (VTI): 1.66 cm^2. Valve   area (Vmax): 1.66 cm^2. Valve area (Vmean): 1.72 cm^2. - Mitral valve: Calcified annulus. - Left atrium: The atrium was moderately dilated. - Atrial septum: There was increased thickness of the septum,   consistent with lipomatous hypertrophy. - Impressions: Abnormal aortic valve as described above. Also with   signifcant MAC for her age. Would strongly consider TEE.   Antimicrobials: Azithromycin 3/30 >> 4/3 Vancomycin 3/30 >> 4/1 Zosyn 3/30 >> 4/6    Subjective: She is alert, conversant. Denies abdominal pain    Objective: Vitals:   02/10/18 1600 02/10/18 1954 02/10/18 2343 02/11/18 0322  BP: (!) 123/46 (!) 111/55 (!) 128/56 129/65  Pulse: (!) 59 (!) 57 63 (!) 57  Resp: _0 Temp:  98.3 F (36.8 C) 98.2 F (36.8 C) 99 F (37.2 C)  TempSrc:  Oral Oral Oral  SpO2: 100% 98% 99% 96%  Weight:      Height:        Intake/Output Summary (Last 24 hours) at 02/11/2018 0815 Last data filed at 02/11/2018 0500 Gross per 24 hour  Intake 1981 ml  Output 750 ml  Net 1231 ml   Filed Weights   02/08/18 0438 02/09/18 0502 02/10/18 0500  Weight: 69.5 kg (153 lb 3.5 oz) 72.4 kg (159 lb 9.8 oz) 72.1 kg (158 lb 15.2 oz)    Examination:  General exam: NAD Respiratory system; Normal respiratory effort.  Cardiovascular system: S 1. S 2 RRR Gastrointestinal system: BS present, soft, nt Central nervous system: alert, follows command.  Extremities: no edema Skin: No rash.   Data Reviewed: I have personally reviewed following labs and imaging studies  CBC: Recent Labs  Lab 02/07/18 0206 02/08/18 0220 02/09/18 0351 02/10/18 0224 02/11/18 0407  WBC 12.5* 11.9* 5.9 5.9 7.6  HGB 9.8* 10.4* 9.1* 9.9* 10.5*  HCT 31.3* 33.2* 29.7* 30.8* 31.7*  MCV 97.8 100.3* 101.4* 95.7 94.3  PLT 409* 433* 282 241 650   Basic Metabolic Panel: Recent Labs  Lab 02/06/18 0426  02/08/18 0723 02/09/18 0351 02/10/18 0814  02/10/18 1548 02/10/18 2053 02/11/18 0407  NA 140   < > 149* 150* 137 135  --  138  K 3.9   < > 4.1 3.1* 2.6* 3.6  --  3.7  CL 99*   < > 112* 115* 105 108  --  107  CO2 28   < > _1 21*  --  22  GLUCOSE 208*   < > 142* 153* 195* 192*  --  218*  BUN 20   < > 39* 25* 11 10  --  14  CREATININE 0.90   < > 1.06* 0.96 0.73 0.67  --  0.82  CALCIUM 9.1   < > 8.8* 7.9* 7.6* 7.4*  --  7.7*  MG 1.9  --   --   --   --  1.4*  --  2.2  PHOS 4.3  --   --   --   --   --  2.1* 2.3*   < > =  values in this interval not displayed.   GFR: Estimated Creatinine Clearance: 72.1 mL/min (by C-G formula based on SCr of 0.82 mg/dL). Liver Function Tests: Recent Labs  Lab 02/09/18 0351  AST 19  ALT 22  ALKPHOS 79  BILITOT 0.6  PROT 5.6*  ALBUMIN 2.4*   No results for input(s): LIPASE, AMYLASE in the last 168 hours. No results for input(s): AMMONIA in the last 168 hours. Coagulation Profile: Recent Labs  Lab 02/07/18 1538 02/08/18 0220 02/09/18 0351 02/10/18 0224 02/11/18 0407  INR 1.22 1.19 1.50 1.78 1.64   Cardiac Enzymes: No results for input(s): CKTOTAL, CKMB, CKMBINDEX, TROPONINI in the last 168 hours. BNP (last 3 results) No results for input(s): PROBNP in the last 8760 hours. HbA1C: No results for input(s): HGBA1C in the last 72 hours. CBG: Recent Labs  Lab 02/09/18 2153 02/10/18 0757 02/10/18 1216 02/10/18 1725 02/10/18 2139  GLUCAP 236* 160* 263* 188* 274*   Lipid Profile: No results for input(s): CHOL, HDL, LDLCALC, TRIG, CHOLHDL, LDLDIRECT in the last 72 hours. Thyroid Function Tests: No results for input(s): TSH, T4TOTAL, FREET4, T3FREE, THYROIDAB in the last 72 hours. Anemia Panel: No results for input(s): VITAMINB12, FOLATE, FERRITIN, TIBC, IRON, RETICCTPCT in the last 72 hours. Sepsis Labs: No results for input(s): PROCALCITON, LATICACIDVEN in the last 168 hours.  Recent Results (from the past 240 hour(s))  Culture, blood (routine x 2)     Status: None  (Preliminary result)   Collection Time: 02/08/18  8:45 AM  Result Value Ref Range Status   Specimen Description BLOOD RIGHT HAND  Final   Special Requests   Final    BOTTLES DRAWN AEROBIC AND ANAEROBIC Blood Culture adequate volume   Culture   Final    NO GROWTH 2 DAYS Performed at [facility], [address] [place], [place] [address]    Report Status PENDING  Incomplete  Culture, blood (routine x 2)     Status: None (Preliminary result)   Collection Time: 02/08/18  8:45 AM  Result Value Ref Range Status   Specimen Description BLOOD LEFT HAND  Final   Special Requests   Final    BOTTLES DRAWN AEROBIC AND ANAEROBIC Blood Culture adequate volume   Culture   Final    NO GROWTH 2 DAYS Performed at [facility], [place] [address] [place], [place] [address]    Report Status PENDING  Incomplete  Urine Culture     Status: None   Collection Time: 02/08/18 10:00 AM  Result Value Ref Range Status   Specimen Description URINE, CATHETERIZED  Final   Special Requests NONE  Final   Culture   Final    NO GROWTH Performed at [facility], [place] [address], [place], [place] [address]    Report Status 02/09/2018 FINAL  Final  C difficile quick scan w PCR reflex     Status: None   Collection Time: 02/10/18 10:07 PM  Result Value Ref Range Status   C Diff antigen NEGATIVE NEGATIVE Final   C Diff toxin NEGATIVE NEGATIVE Final   C Diff interpretation No C. difficile detected.  Final    Comment: Performed at [facility], [place] [address] [place], [place] [address]         Radiology Studies: Ct Angio Head W Or Wo Contrast  Result Date: 02/10/2018 CLINICAL DATA:  57 year old female with right upper extremity weakness. Unequal pupils, anisocoria. 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 EXAM:  CT ANGIOGRAPHY HEAD AND NECK TECHNIQUE: Multidetector CT imaging of the head and neck was performed using the standard protocol during bolus administration  of intravenous contrast. Multiplanar CT image reconstructions and MIPs were obtained to evaluate the vascular anatomy. Carotid stenosis measurements (when applicable) are obtained utilizing NASCET criteria, using the distal internal carotid diameter as the denominator. CONTRAST:  50m ISOVUE-370 IOPAMIDOL (ISOVUE-370) INJECTION 76% COMPARISON:  Brain MRI 0437 hours today. Head CT without contrast 02/09/2018, and earlier. Chest CTA 02/12/2015 FINDINGS: CT HEAD Brain: Stable gray-white matter differentiation throughout the brain. No midline shift, ventriculomegaly, mass effect, evidence of mass lesion, intracranial hemorrhage or evidence of cortically based acute infarction. Calvarium and skull base: Negative. Paranasal sinuses: Stable paranasal sinuses, tympanic cavities, and mastoids with mild bilateral mucosal thickening and mastoid effusions. Orbits: Visualized orbit soft tissues are within normal limits. Visualized scalp soft tissues are within normal limits. CTA NECK Skeleton: No acute osseous abnormality identified. 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 Mild upper thoracic scoliosis. Upper chest: Patchy peripheral upper lung opacity, probably atelectasis. No superior mediastinal lymphadenopathy. Other neck: The bilateral submandibular glands and parotid glands are completely fatty atrophied. Otherwise negative neck soft tissues. No neck mass or lymphadenopathy. Mild motion artifact at the thoracic inlet. Aortic arch: Right side aortic arch with aberrancy left subclavian artery origin and ectatic distal arch measuring up to 31 millimeters diameter. This appears stable since 2016. No definite superimposed arch atherosclerosis. Right carotid system: Separate right CCA origin from the right side aortic arch is patent without stenosis. There is an early bifurcation of the right carotid artery occurring at the mid thyroid level.  No atherosclerosis or stenosis. Mildly tortuous but otherwise normal right ICA to the skull base. Left carotid system: No left CCA origin stenosis. Calcified plaque in the distal left CCA just proximal to and leading up to the bifurcation continuing into the left ICA origin and bulb. However, no stenosis occurs. Mildly tortuous and otherwise negative left ICA to the skull base. Vertebral arteries: No proximal right subclavian artery stenosis. The right vertebral artery origin is early and without atherosclerosis or stenosis. The right vertebral artery is patent to the skull base without stenosis. Aberrancy left subclavian artery origin from the right side aortic arch with mild infundibulum. Mild proximal left subclavian calcified plaque without stenosis. The left vertebral artery origin appears normal (series 16, image 149). The left vertebral artery appears patent to the skull base without atherosclerosis or stenosis. CTA HEAD Posterior circulation: There is moderate to severe bilateral proximal V4 segment calcified plaque. Both distal vertebral arteries remain patent. There is mild stenosis on the left and moderate stenosis on the right. The distal left vertebral artery is mildly dominant. Normal PICA origins. Patent vertebrobasilar junction and basilar artery without plaque or stenosis. Normal SCA and PCA origins. Diminutive right posterior communicating artery, the left is smaller or absent. Bilateral PCA branches are within normal limits. Anterior circulation: Both ICA siphons are patent. There is mild to moderate calcified plaque on the left with no significant stenosis. There is mild calcified plaque on the right with no significant stenosis. Patent carotid termini. Normal MCA and ACA origins. Anterior communicating artery and bilateral ACA branches appear normal. The left MCA bifurcates early. The left M1, bifurcation, and left MCA branches are within normal limits. The right MCA M1 segment, bifurcation, and  right MCA branches are within normal limits (simplified right MCA branching pattern). Venous sinuses: Patent  on the delayed images. Anatomic variants: Right side aortic arch with aberrancy left subclavian artery origin. Early right carotid bifurcation (at the thyroid level). Early right vertebral artery origin. Mildly dominant left vertebral artery. Early branching of the left MCA. Simplified right MCA branching pattern. Delayed phase: No abnormal enhancement identified. Review of the MIP images confirms the above findings IMPRESSION: 1. Mild atherosclerosis with no hemodynamically significant arterial stenosis in the head or neck. Negative for dissection or intracranial aneurysm. 2. Multiple arterial anatomic variations, most notably right side aortic arch with abdomen and left subclavian artery. 3. No acute intracranial abnormality. Stable and negative noncontrast CT appearance of the brain. 4. 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 Consider annual imaging followup by CTA or MRA. This recommendation follows 2010 ACCF/AHA/AATS/ACR/ASA/SCA/SCAI/SIR/STS/SVM Guidelines for the Diagnosis and Management of Patients with Thoracic Aortic Disease. Circulation.2010; 121: W258-N277. Electronically Signed   By: Genevie Ann M.D.   On: 02/10/2018 15:34   Ct Angio Neck W Or Wo Contrast  Result Date: 02/10/2018 CLINICAL DATA:  57 year old female with right upper extremity weakness. Unequal pupils, anisocoria. 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 EXAM: CT ANGIOGRAPHY HEAD AND NECK TECHNIQUE: Multidetector CT imaging of the head and neck was performed using the standard protocol during bolus administration of intravenous contrast. Multiplanar CT image reconstructions and MIPs were obtained to evaluate the vascular anatomy. Carotid stenosis measurements (when applicable) are obtained utilizing NASCET criteria, using the distal internal  carotid diameter as the denominator. CONTRAST:  27m ISOVUE-370 IOPAMIDOL (ISOVUE-370) INJECTION 76% COMPARISON:  Brain MRI 0437 hours today. Head CT without contrast 02/09/2018, and earlier. Chest CTA 02/12/2015 FINDINGS: CT HEAD Brain: Stable gray-white matter differentiation throughout the brain. No midline shift, ventriculomegaly, mass effect, evidence of mass lesion, intracranial hemorrhage or evidence of cortically based acute infarction. Calvarium and skull base: Negative. Paranasal sinuses: Stable paranasal sinuses, tympanic cavities, and mastoids with mild bilateral mucosal thickening and mastoid effusions. Orbits: Visualized orbit soft tissues are within normal limits. Visualized scalp soft tissues are within normal limits. CTA NECK Skeleton: No acute osseous abnormality identified. 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 Mild upper thoracic scoliosis. Upper chest: Patchy peripheral upper lung opacity, probably atelectasis. No superior mediastinal lymphadenopathy. Other neck: The bilateral submandibular glands and parotid glands are completely fatty atrophied. Otherwise negative neck soft tissues. No neck mass or lymphadenopathy. Mild motion artifact at the thoracic inlet. Aortic arch: Right side aortic arch with aberrancy left subclavian artery origin and ectatic distal arch measuring up to 31 millimeters diameter. This appears stable since 2016. No definite superimposed arch atherosclerosis. Right carotid system: Separate right CCA origin from the right side aortic arch is patent without stenosis. There is an early bifurcation of the right carotid artery occurring at the mid thyroid level. No atherosclerosis or stenosis. Mildly tortuous but otherwise normal right ICA to the skull base. Left carotid system: No left CCA origin stenosis. Calcified plaque in the distal left CCA just proximal to and leading up to the  bifurcation continuing into the left ICA origin and bulb. However, no stenosis occurs. Mildly tortuous and otherwise negative left ICA to the skull base. Vertebral arteries: No proximal right subclavian artery stenosis. The right vertebral artery origin is early and without atherosclerosis or stenosis. The right vertebral artery is patent to the skull base without stenosis. Aberrancy left subclavian artery origin from the  right side aortic arch with mild infundibulum. Mild proximal left subclavian calcified plaque without stenosis. The left vertebral artery origin appears normal (series 16, image 149). The left vertebral artery appears patent to the skull base without atherosclerosis or stenosis. CTA HEAD Posterior circulation: There is moderate to severe bilateral proximal V4 segment calcified plaque. Both distal vertebral arteries remain patent. There is mild stenosis on the left and moderate stenosis on the right. The distal left vertebral artery is mildly dominant. Normal PICA origins. Patent vertebrobasilar junction and basilar artery without plaque or stenosis. Normal SCA and PCA origins. Diminutive right posterior communicating artery, the left is smaller or absent. Bilateral PCA branches are within normal limits. Anterior circulation: Both ICA siphons are patent. There is mild to moderate calcified plaque on the left with no significant stenosis. There is mild calcified plaque on the right with no significant stenosis. Patent carotid termini. Normal MCA and ACA origins. Anterior communicating artery and bilateral ACA branches appear normal. The left MCA bifurcates early. The left M1, bifurcation, and left MCA branches are within normal limits. The right MCA M1 segment, bifurcation, and right MCA branches are within normal limits (simplified right MCA branching pattern). Venous sinuses: Patent on the delayed images. Anatomic variants: Right side aortic arch with aberrancy left subclavian artery origin. Early  right carotid bifurcation (at the thyroid level). Early right vertebral artery origin. Mildly dominant left vertebral artery. Early branching of the left MCA. Simplified right MCA branching pattern. Delayed phase: No abnormal enhancement identified. Review of the MIP images confirms the above findings IMPRESSION: 1. Mild atherosclerosis with no hemodynamically significant arterial stenosis in the head or neck. Negative for dissection or intracranial aneurysm. 2. Multiple arterial anatomic variations, most notably right side aortic arch with abdomen and left subclavian artery. 3. No acute intracranial abnormality. Stable and negative noncontrast CT appearance of the brain. 4. 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 Consider annual imaging followup by CTA or MRA. This recommendation follows 2010 ACCF/AHA/AATS/ACR/ASA/SCA/SCAI/SIR/STS/SVM Guidelines for the Diagnosis and Management of Patients with Thoracic Aortic Disease. Circulation.2010; 121: I951-O841. Electronically Signed   By: Genevie Ann M.D.   On: 02/10/2018 15:34   Mr Brain Wo Contrast  Result Date: 02/10/2018 CLINICAL DATA:  Unequal pupils, RIGHT-sided weakness in the context of continued encephalopathy. History of hypertension, hyperlipidemia, diabetes, seizures. EXAM: MRI HEAD WITHOUT CONTRAST TECHNIQUE: Multiplanar, multiecho pulse sequences of the brain and surrounding structures were obtained without intravenous contrast. COMPARISON:  CT HEAD February 09, 2018 FINDINGS: Some of the sequences are mildly motion degraded. INTRACRANIAL CONTENTS: No reduced diffusion to suggest acute ischemia or hyperacute demyelination. RIGHT frontal chronic microhemorrhage. The ventricles and sulci are upper limits of normal for patient's age. Mild periventricular and pontine white matter FLAIR T2 hyperintensities compatible chronic small vessel ischemic disease. No suspicious parenchymal signal, masses, mass  effect. No abnormal extra-axial fluid collections. No extra-axial masses. VASCULAR: Normal major intracranial vascular flow voids present at skull base. SKULL AND UPPER CERVICAL SPINE: No abnormal sellar expansion. No suspicious calvarial bone marrow signal. Craniocervical junction maintained. SINUSES/ORBITS: Mild paranasal sinus mucosal thickening. Bilateral mastoid effusions.The included ocular globes and orbital contents are non-suspicious. OTHER: None. IMPRESSION: 1. No acute intracranial process on this motion degraded examination. 2. Borderline parenchymal brain volume loss and mild chronic small vessel ischemic disease. Electronically Signed   By: Elon Alas M.D.   On: 02/10/2018 05:34   Ct Head Code Stroke Wo Contrast`  Result Date: 02/09/2018 CLINICAL DATA:  Code stroke. Altered mental status. Asymmetric pupil dilation. EXAM: CT HEAD WITHOUT CONTRAST TECHNIQUE: Contiguous axial images were obtained from the base of the skull through the vertex without intravenous contrast. COMPARISON:  CT head without contrast 01/27/2018 FINDINGS: Brain: Mild atrophy and white matter changes are stable. No acute infarct, hemorrhage, or mass lesion is present. Ventricles are of normal size. No significant extra-axial fluid collection is present. The brainstem and cerebellum are normal. Vascular: Atherosclerotic changes are present in the cavernous internal carotid arteries and at the dural margin of both vertebral arteries. No hyperdense vessel is present. Skull: Calvarium is intact. 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 No focal lytic or blastic lesions are present. Sinuses/Orbits: Mucosal thickening is present inferiorly in the right maxillary sinus. There is minimal mucosal thickening in the posterior sphenoid sinuses bilaterally. Frontal sinuses are clear. Globes and orbits are within normal limits. ASPECTS [facility] Stroke Program Early CT Score) - Ganglionic level infarction (caudate,  lentiform nuclei, internal capsule, insula, M1-M3 cortex): 7/7 - Supraganglionic infarction (M4-M6 cortex): 3/3 Total score (0-10 with 10 being normal): 10/10 IMPRESSION: 1. No acute intracranial abnormality or significant interval change. 2. Stable mild atrophy and white matter disease. 3. ASPECTS is 10/10 The above was relayed via text pager to Dr. Cheral Marker on 02/09/2018 at 10:17 . Electronically Signed   By: San Morelle M.D.   On: 02/09/2018 10:19        Scheduled Meds: . benztropine  1 mg Oral BID  . chlorhexidine  15 mL Mouth Rinse BID  . insulin aspart  0-15 Units Subcutaneous TID WC  . insulin aspart  0-5 Units Subcutaneous QHS  . insulin glargine  30 Units Subcutaneous Daily  . levETIRAcetam  1,000 mg Oral BID  . magnesium oxide  200 mg Oral BID  . mouth rinse  15 mL Mouth Rinse q12n4p  . metoprolol succinate  50 mg Oral Daily  . mupirocin cream   Topical Daily  . pantoprazole  40 mg Oral QHS  . phosphorus  500 mg Oral TID WC  . Warfarin - Pharmacist Dosing Inpatient   Does not apply q1800   Continuous Infusions: . sodium chloride Stopped (02/08/18 1142)  . ampicillin-sulbactam (UNASYN) IV Stopped (02/11/18 0352)  . heparin 1,300 Units/hr (02/10/18 0534)     LOS: 15 days    Time spent: 35 minutes.     Elmarie Shiley, MD [facility] Pager [phone]  If 7PM-7AM, please contact night-coverage [email or web address] Password [facility] 02/11/2018, 8:15 AM

## 2018-02-11 NOTE — Progress Notes (Signed)
ekg showed QTC 524.  BMP resulted.  Results called to C. Bodenheimer, NP.

## 2018-02-12 LAB — BASIC METABOLIC PANEL
ANION GAP: 9 (ref 5–15)
BUN: 11 mg/dL (ref 6–20)
CO2: 23 mmol/L (ref 22–32)
Calcium: 7.8 mg/dL — ABNORMAL LOW (ref 8.9–10.3)
Chloride: 106 mmol/L (ref 101–111)
Creatinine, Ser: 0.8 mg/dL (ref 0.44–1.00)
GFR calc Af Amer: >60 mL/min (ref >60)
GLUCOSE: 226 mg/dL — AB (ref 65–99)
Potassium: 3.8 mmol/L (ref 3.5–5.1)
Sodium: 138 mmol/L (ref 135–145)

## 2018-02-12 LAB — GLUCOSE, CAPILLARY
GLUCOSE-CAPILLARY: 160 mg/dL — AB (ref 65–99)
GLUCOSE-CAPILLARY: 132 mg/dL — AB (ref 65–99)
Glucose-Capillary: 115 mg/dL — ABNORMAL HIGH (ref 65–99)
Glucose-Capillary: 158 mg/dL — ABNORMAL HIGH (ref 65–99)

## 2018-02-12 LAB — CBC
HEMATOCRIT: 30.3 % — AB (ref 36.0–46.0)
HEMOGLOBIN: 10 g/dL — AB (ref 12.0–15.0)
MCH: 31.2 pg (ref 26.0–34.0)
MCHC: 33 g/dL (ref 30.0–36.0)
MCV: 94.4 fL (ref 78.0–100.0)
Platelets: 286 10*3/uL (ref 150–400)
RBC: 3.21 MIL/uL — ABNORMAL LOW (ref 3.87–5.11)
RDW: 14.4 % (ref 11.5–15.5)
WBC: 6.8 10*3/uL (ref 4.0–10.5)

## 2018-02-12 LAB — PHOSPHORUS: Phosphorus: 2.7 mg/dL (ref 2.5–4.6)

## 2018-02-12 LAB — MAGNESIUM: Magnesium: 2 mg/dL (ref 1.7–2.4)

## 2018-02-12 LAB — PROTIME-INR
INR: 1.54
Prothrombin Time: 18.3 seconds — ABNORMAL HIGH (ref 11.4–15.2)

## 2018-02-12 LAB — HEPARIN LEVEL (UNFRACTIONATED): Heparin Unfractionated: 0.29 IU/mL — ABNORMAL LOW (ref 0.30–0.70)

## 2018-02-12 MED ORDER — CALCIUM CITRATE 950 (200 CA) MG PO TABS
200.0000 mg | ORAL_TABLET | Freq: Every day | ORAL | Status: DC
  Administered 2018-02-12 – 2018-02-13 (×2): 200 mg via ORAL
  Filled 2018-02-12 (×2): qty 1

## 2018-02-12 MED ORDER — ENOXAPARIN SODIUM 80 MG/0.8ML ~~LOC~~ SOLN
75.0000 mg | Freq: Two times a day (BID) | SUBCUTANEOUS | Status: DC
  Administered 2018-02-13 (×2): 75 mg via SUBCUTANEOUS
  Filled 2018-02-12 (×2): qty 0.8

## 2018-02-12 MED ORDER — WARFARIN SODIUM 5 MG PO TABS
5.0000 mg | ORAL_TABLET | Freq: Once | ORAL | Status: AC
  Administered 2018-02-12: 5 mg via ORAL
  Filled 2018-02-12: qty 1

## 2018-02-12 MED ORDER — K PHOS MONO-SOD PHOS DI & MONO 155-852-130 MG PO TABS
500.0000 mg | ORAL_TABLET | Freq: Two times a day (BID) | ORAL | Status: DC
  Administered 2018-02-12 – 2018-02-13 (×3): 500 mg via ORAL
  Filled 2018-02-12 (×4): qty 2

## 2018-02-12 MED ORDER — ENOXAPARIN SODIUM 80 MG/0.8ML ~~LOC~~ SOLN
75.0000 mg | Freq: Once | SUBCUTANEOUS | Status: AC
  Administered 2018-02-12: 14:00:00 75 mg via SUBCUTANEOUS
  Filled 2018-02-12: qty 0.8

## 2018-02-12 MED ORDER — SACCHAROMYCES BOULARDII 250 MG PO CAPS
250.0000 mg | ORAL_CAPSULE | Freq: Two times a day (BID) | ORAL | Status: DC
  Administered 2018-02-12 – 2018-02-13 (×3): 250 mg via ORAL
  Filled 2018-02-12 (×3): qty 1

## 2018-02-12 NOTE — Progress Notes (Signed)
Physical Therapy Treatment Patient Details Name: Natasha Martin S Redwine MRN: [id number] DOB: 21-Dec-1960 Today's Date: 02/12/2018    History of Present Illness Pt is a [age] y.o. female admitted 01/27/18 after being found unresponsive; EMS found saturations in 20s. Pt was intubated upon arrival. CT shows no acute intracranial abnormality; mild chronic small-vessel ischemic changes. CXR showed diffuse bilateral infiltrates. Worked up for ARDS likely secondary to aspiration event. Extubated 4/8. PMH includes mild mental slowing, seizures, schizophrenia, HTN, DM, anxiety.      PT Comments    Pt demo'd significant improvement. Pt able to ambulate this date however remains unsafe as pt is impulsive, unsteady and at increased falls risk. Acute PT to con't to follow.    Follow Up Recommendations  SNF;Supervision/Assistance - 24 hour     Equipment Recommendations       Recommendations for Other Services       Precautions / Restrictions Precautions Precautions: Fall Restrictions Weight Bearing Restrictions: No    Mobility  Bed Mobility               General bed mobility comments: pt up in chair upon PT arrival  Transfers Overall transfer level: Needs assistance Equipment used: 1 person hand held assist Transfers: Sit to/from Stand Sit to Stand: Min assist;+2 safety/equipment         General transfer comment: max v/c's for hand placement on arm rests, v/c's to slow sdown, minA to steady pt during transition of hands and for safety  Ambulation/Gait Ambulation/Gait assistance: Min assist;Mod assist;+2 safety/equipment Ambulation Distance (Feet): 175 Feet Assistive device: Right platform walker;1 person hand held assist Gait Pattern/deviations: Step-through pattern;Staggering left;Staggering right Gait velocity: slow Gait velocity interpretation: (impulsively fast) General Gait Details: pt given RW, minA for walker managment due to constant vearing to the L and decreased stability. v/c's  to slow down. pt amb with RW for 100' and then with R HHA for 75", pt remains unsteady but safer with HHA than with RW due to inability to use RW safely   Stairs             Wheelchair Mobility    Modified Rankin (Stroke Patients Only)       Balance Overall balance assessment: Needs assistance Sitting-balance support: No upper extremity supported;Feet supported Sitting balance-Leahy Scale: Fair     Standing balance support: Single extremity supported Standing balance-Leahy Scale: Fair                              Cognition Arousal/Alertness: Awake/alert Behavior During Therapy: Impulsive Overall Cognitive Status: History of cognitive impairments - at baseline                           Safety/Judgement: Decreased awareness of safety;Decreased awareness of deficits   Problem Solving: Difficulty sequencing;Requires verbal cues;Requires tactile cues General Comments: pt with history of mental retardation and schizophrenia. Pt impulsive requiring max directional v/c's for safety      Exercises      General Comments        Pertinent Vitals/Pain Pain Assessment: No/denies pain    Home Living                      Prior Function            PT Goals (current goals can now be found in the care plan section) Acute Rehab PT Goals Patient Stated  Goal: walk Progress towards PT goals: Progressing toward goals    Frequency    Min 3X/week      PT Plan Current plan remains appropriate    Co-evaluation              AM-PAC PT "6 Clicks" Daily Activity  Outcome Measure  Difficulty turning over in bed (including adjusting bedclothes, sheets and blankets)?: Unable Difficulty moving from lying on back to sitting on the side of the bed? : A Little Difficulty sitting down on and standing up from a chair with arms (e.g., wheelchair, bedside commode, etc,.)?: A Little Help needed moving to and from a bed to chair (including a  wheelchair)?: A Little Help needed walking in hospital room?: A Little Help needed climbing 3-5 steps with a railing? : A Lot 6 Click Score: 15    End of Session Equipment Utilized During Treatment: Oxygen Activity Tolerance: Patient tolerated treatment well Patient left: in chair;with call bell/phone within reach;with chair alarm set;with family/visitor present Nurse Communication: Mobility status PT Visit Diagnosis: Other abnormalities of gait and mobility (R26.89);Muscle weakness (generalized) (M62.81)     Time: 1610-9604 PT Time Calculation (min) (ACUTE ONLY): 18 min  Charges:  $Gait Training: 8-22 mins                    G Codes:       Lewis Shock, PT, DPT Pager #: [phone] Office #: [phone]    Bostyn Bogie M Aiva Miskell 02/12/2018, 12:11 PM

## 2018-02-12 NOTE — Progress Notes (Signed)
PROGRESS NOTE    Natasha Martin  WFU:[id number] DOB: Jul 04, 1961 DOA: 01/27/2018 PCP: Neale Burly, MD   Brief Narrative: this 57 year old with diabetes felt mental delay and schizophrenia who also has a reported history of seizures in the chart. For the past 2-3 days she has been having a productive cough and she was found in the bathroom this morning unresponsive. When EMS arrived her saturations were in the 20s. She was taking to [facility] where she was intubated and chest x-ray has shown diffuse bilateral infiltrates.Admitted3/30/2019  Significant Events: 01/27/2018: [facility]; [facility]ated; CXR with bilateral infiltrates 01/28/2018: ARDS protocol; Nimbex started 01/30/2018: Nimbex stopped; required propofol > drop in BP and levo started 01/31/2018: Bronchoscopy (cultures with yeast only) 02/05/2018: Extubated, A-line out  Assessment & Plan:   Principal Problem:   Schizophrenia ([place]) Active Problems:   Acute respiratory failure ([place])   ARDS (adult respiratory distress syndrome) ([place])  Acute resp failure 2/2 ARDS likely 2/2 aspiration event.  Extubated 4-08. Now on 1 L oxygen.  Received 7 days of antibiotics.  Hold lasix. Taper off solumedrol.  Fevers; 4-11. Cover for aspiration; now afebrile.  Pan-culture. blood culture 4-11; no growth to date. . Chest x ray improved  Unasyn to cover for aspiration. Started 4-11 plan to discontinue 4-15 to complete 5 days treatment,.    Prolong Qt ; Magnesium was replaced.  Hold Seroquel. Psych consulted again to help adjust medications. .  Monitor QT. restarted on latuda, lower dose. Per family patient couldn't sleep last night. Psych to follow on today.  EKG today with decreased  QT Monitor electrolytes.   Schizophrenia, depression;  Appreciate Psych;  Continue cogentin.  Patient develops prolongation of QT up to 600. Psych medications adjusted. Electrolytes replete.  restarted on latuda, lower dose. Per family  patient couldn't sleep last night. Psych to see patient.   Acute encephalopathy, AMS;  On 4-11 she was more lethargic, had fever 103. Neuro exam was limited due to AMS. She was off of her Psych med.  4-12;  right Upper extremity weakness and left pupil more dilated than right.  On heparin for A fib.  Neuro consulted. Appreciate help. MRI negative for stroke. CT head negative.   CT angio head neck due to anisocoria. CT angio head and neck no significant finding.   A fib;  Resume lopressor.  Transition form heparin to lovenox.  Started  Coumadin.  Cardiology recommended holding amiodarone.   HTN;  Resume metoprolol. Daily dose.  Hydralazine PRN.   Septic shock; Off levophed.  Treated with IV antibiotics.  C diff negative.   Aortic and mitral valve diseases;  Cardiology was consulted, no work up during this admission.   Anemia;  Hb at 3, on admission, probably not accurate.  Hb now at 9. Follow trend.   DM On lantus and SSI.   Mild mental delay.  B 12 731. , TSH 0.8.   Seizure. ?  She was stared on  keppra on admission. Per sister patient was not taking ; carbamazepine.   Hypernatremia; hold lasix. Encourage oral intake. Resolved. Stop D 5 IV   Hypokalemia; replaced.  Hypophosphatemia; replace orally.   Diarrhea; c diff negative. Started florastore.   DVT prophylaxis: heparin.  Code Status: full code.  Family Communication: no family at bedside.  Disposition Plan: PT eval.    Consultants:   CCM   Procedures:  Left ventricle: The cavity size was normal. Wall thickness was   increased in a  pattern of mild LVH. Systolic function was normal.   The estimated ejection fraction was in the range of 55% to 60%.   Wall motion was normal; there were no regional wall motion   abnormalities. Features are consistent with a pseudonormal left   ventricular filling pattern, with concomitant abnormal relaxation   and increased filling pressure (grade 2 diastolic  dysfunction). - Aortic valve: The aortic valve is markedly abnormal. Appears   trileaflet but I cannot see the left coronary cusp well. The   non-coronary cusp is markedly thickened and calcified. The right   coronary cusp may be fused. There is significant turbulence over   the valve with mild stenosis. Would strongly consider TEE. There   was trivial regurgitation. Valve area (VTI): 1.66 cm^2. Valve   area (Vmax): 1.66 cm^2. Valve area (Vmean): 1.72 cm^2. - Mitral valve: Calcified annulus. - Left atrium: The atrium was moderately dilated. - Atrial septum: There was increased thickness of the septum,   consistent with lipomatous hypertrophy. - Impressions: Abnormal aortic valve as described above. Also with   signifcant MAC for her age. Would strongly consider TEE.   Antimicrobials: Azithromycin 3/30 >> 4/3 Vancomycin 3/30 >> 4/1 Zosyn 3/30 >> 4/6    Subjective: She is alert, conversant, confuse.    Objective: Vitals:   02/11/18 1600 02/11/18 1958 02/12/18 0720 02/12/18 1215  BP: (!) 141/67 (!) 111/94 (!) 141/58 124/71  Pulse: 68 69 63 65  Resp: 17 (!) _0 Temp: 98.7 F (37.1 C) 98.7 F (37.1 C) 98.7 F (37.1 C) 98.9 F (37.2 C)  TempSrc: Oral Oral Oral Oral  SpO2: 99% 98% 98% 97%  Weight:   73.1 kg (161 lb 2.5 oz)   Height:   _1  (1.575 m)     Intake/Output Summary (Last 24 hours) at 02/12/2018 1310 Last data filed at 02/12/2018 1215 Gross per 24 hour  Intake 1663.4 ml  Output 700 ml  Net 963.4 ml   Filed Weights   02/10/18 0500 02/11/18 0818 02/12/18 0720  Weight: 72.1 kg (158 lb 15.2 oz) 70.6 kg (155 lb 10.3 oz) 73.1 kg (161 lb 2.5 oz)    Examination:  General exam: NAD Respiratory system; Normal respiratory effort.  Cardiovascular system: S 1, S 2 RRR Gastrointestinal system: BS present, soft, Central nervous system: Alert, follows command.  Extremities: no edema Skin: No rash.   Data Reviewed: I have personally reviewed following labs and  imaging studies  CBC: Recent Labs  Lab 02/08/18 0220 02/09/18 0351 02/10/18 0224 02/11/18 0407 02/12/18 0234  WBC 11.9* 5.9 5.9 7.6 6.8  HGB 10.4* 9.1* 9.9* 10.5* 10.0*  HCT 33.2* 29.7* 30.8* 31.7* 30.3*  MCV 100.3* 101.4* 95.7 94.3 94.4  PLT 433* 282 241 266 956   Basic Metabolic Panel: Recent Labs  Lab 02/06/18 0426  02/10/18 0814 02/10/18 1548 02/10/18 2053 02/11/18 0407 02/11/18 1939 02/12/18 0234  NA 140   < > 137 135  --  138 138 138  K 3.9   < > 2.6* 3.6  --  3.7 3.3* 3.8  CL 99*   < > 105 108  --  107 108 106  CO2 28   < > 23 21*  --  _2 GLUCOSE 208*   < > 195* 192*  --  218* 116* 226*  BUN 20   < > 11 10  --  _3 CREATININE 0.90   < > 0.73 0.67  --  0.82 0.78 0.80  CALCIUM 9.1   < > 7.6* 7.4*  --  7.7* 7.6* 7.8*  MG 1.9  --   --  1.4*  --  2.2  --  2.0  PHOS 4.3  --   --   --  2.1* 2.3*  --  2.7   < > = values in this interval not displayed.   GFR: Estimated Creatinine Clearance: 74.4 mL/min (by C-G formula based on SCr of 0.8 mg/dL). Liver Function Tests: Recent Labs  Lab 02/09/18 0351 02/11/18 0823  AST 19  --   ALT 22  --   ALKPHOS 79  --   BILITOT 0.6  --   PROT 5.6*  --   ALBUMIN 2.4* 2.5*   No results for input(s): LIPASE, AMYLASE in the last 168 hours. No results for input(s): AMMONIA in the last 168 hours. Coagulation Profile: Recent Labs  Lab 02/08/18 0220 02/09/18 0351 02/10/18 0224 02/11/18 0407 02/12/18 0234  INR 1.19 1.50 1.78 1.64 1.54   Cardiac Enzymes: No results for input(s): CKTOTAL, CKMB, CKMBINDEX, TROPONINI in the last 168 hours. BNP (last 3 results) No results for input(s): PROBNP in the last 8760 hours. HbA1C: No results for input(s): HGBA1C in the last 72 hours. CBG: Recent Labs  Lab 02/11/18 1246 02/11/18 1736 02/11/18 2037 02/12/18 0808 02/12/18 1213  GLUCAP 148* 135* 123* 160* 115*   Lipid Profile: No results for input(s): CHOL, HDL, LDLCALC, TRIG, CHOLHDL, LDLDIRECT in the last 72  hours. Thyroid Function Tests: No results for input(s): TSH, T4TOTAL, FREET4, T3FREE, THYROIDAB in the last 72 hours. Anemia Panel: No results for input(s): VITAMINB12, FOLATE, FERRITIN, TIBC, IRON, RETICCTPCT in the last 72 hours. Sepsis Labs: No results for input(s): PROCALCITON, LATICACIDVEN in the last 168 hours.  Recent Results (from the past 240 hour(s))  Culture, blood (routine x 2)     Status: None (Preliminary result)   Collection Time: 02/08/18  8:45 AM  Result Value Ref Range Status   Specimen Description BLOOD RIGHT HAND  Final   Special Requests   Final    BOTTLES DRAWN AEROBIC AND ANAEROBIC Blood Culture adequate volume   Culture   Final    NO GROWTH 3 DAYS Performed at [facility], [address] [place], [place] [address]    Report Status PENDING  Incomplete  Culture, blood (routine x 2)     Status: None (Preliminary result)   Collection Time: 02/08/18  8:45 AM  Result Value Ref Range Status   Specimen Description BLOOD LEFT HAND  Final   Special Requests   Final    BOTTLES DRAWN AEROBIC AND ANAEROBIC Blood Culture adequate volume   Culture   Final    NO GROWTH 3 DAYS Performed at [facility], [place] [address], [place], [place] [address]    Report Status PENDING  Incomplete  Urine Culture     Status: None   Collection Time: 02/08/18 10:00 AM  Result Value Ref Range Status   Specimen Description URINE, CATHETERIZED  Final   Special Requests NONE  Final   Culture   Final    NO GROWTH Performed at [facility], [place] [address], [place], [place] [address]    Report Status 02/09/2018 FINAL  Final  C difficile quick scan w PCR reflex     Status: None   Collection Time: 02/10/18 10:07 PM  Result Value Ref Range Status   C Diff antigen NEGATIVE NEGATIVE Final   C  Diff toxin NEGATIVE NEGATIVE Final   C Diff interpretation No C. difficile detected.  Final    Comment: Performed at [facility], [place] [address], [place], [place] [address]          Radiology Studies: Ct Angio Head W Or Wo Contrast  Result Date: 02/10/2018 CLINICAL DATA:  57 year old female with right upper extremity weakness. Unequal pupils, anisocoria. 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 EXAM: CT ANGIOGRAPHY HEAD AND NECK TECHNIQUE: Multidetector CT imaging of the head and neck was performed using the standard protocol during bolus administration of intravenous contrast. Multiplanar CT image reconstructions and MIPs were obtained to evaluate the vascular anatomy. Carotid stenosis measurements (when applicable) are obtained utilizing NASCET criteria, using the distal internal carotid diameter as the denominator. CONTRAST:  12m ISOVUE-370 IOPAMIDOL (ISOVUE-370) INJECTION 76% COMPARISON:  Brain MRI 0437 hours today. Head CT without contrast 02/09/2018, and earlier. Chest CTA 02/12/2015 FINDINGS: CT HEAD Brain: Stable gray-white matter differentiation throughout the brain. No midline shift, ventriculomegaly, mass effect, evidence of mass lesion, intracranial hemorrhage or evidence of cortically based acute infarction. Calvarium and skull base: Negative. Paranasal sinuses: Stable paranasal sinuses, tympanic cavities, and mastoids with mild bilateral mucosal thickening and mastoid effusions. Orbits: Visualized orbit soft tissues are within normal limits. Visualized scalp soft tissues are within normal limits. CTA NECK Skeleton: No acute osseous abnormality identified. 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 Mild upper thoracic scoliosis. Upper chest: Patchy peripheral upper lung opacity, probably atelectasis. No superior mediastinal lymphadenopathy. Other neck: The bilateral submandibular glands and parotid glands are completely fatty atrophied. Otherwise negative neck soft tissues. No neck mass or lymphadenopathy. Mild motion artifact at the thoracic inlet. Aortic  arch: Right side aortic arch with aberrancy left subclavian artery origin and ectatic distal arch measuring up to 31 millimeters diameter. This appears stable since 2016. No definite superimposed arch atherosclerosis. Right carotid system: Separate right CCA origin from the right side aortic arch is patent without stenosis. There is an early bifurcation of the right carotid artery occurring at the mid thyroid level. No atherosclerosis or stenosis. Mildly tortuous but otherwise normal right ICA to the skull base. Left carotid system: No left CCA origin stenosis. Calcified plaque in the distal left CCA just proximal to and leading up to the bifurcation continuing into the left ICA origin and bulb. However, no stenosis occurs. Mildly tortuous and otherwise negative left ICA to the skull base. Vertebral arteries: No proximal right subclavian artery stenosis. The right vertebral artery origin is early and without atherosclerosis or stenosis. The right vertebral artery is patent to the skull base without stenosis. Aberrancy left subclavian artery origin from the right side aortic arch with mild infundibulum. Mild proximal left subclavian calcified plaque without stenosis. The left vertebral artery origin appears normal (series 16, image 149). The left vertebral artery appears patent to the skull base without atherosclerosis or stenosis. CTA HEAD Posterior circulation: There is moderate to severe bilateral proximal V4 segment calcified plaque. Both distal vertebral arteries remain patent. There is mild stenosis on the left and moderate stenosis on the right. The distal left vertebral artery is mildly dominant. Normal PICA origins. Patent vertebrobasilar junction and basilar artery without plaque or stenosis. Normal SCA and PCA origins. Diminutive right posterior communicating artery, the left is smaller or absent. Bilateral PCA branches are within normal limits. Anterior circulation: Both ICA siphons are patent. There is  mild to moderate  calcified plaque on the left with no significant stenosis. There is mild calcified plaque on the right with no significant stenosis. Patent carotid termini. Normal MCA and ACA origins. Anterior communicating artery and bilateral ACA branches appear normal. The left MCA bifurcates early. The left M1, bifurcation, and left MCA branches are within normal limits. The right MCA M1 segment, bifurcation, and right MCA branches are within normal limits (simplified right MCA branching pattern). Venous sinuses: Patent on the delayed images. Anatomic variants: Right side aortic arch with aberrancy left subclavian artery origin. Early right carotid bifurcation (at the thyroid level). Early right vertebral artery origin. Mildly dominant left vertebral artery. Early branching of the left MCA. Simplified right MCA branching pattern. Delayed phase: No abnormal enhancement identified. Review of the MIP images confirms the above findings IMPRESSION: 1. Mild atherosclerosis with no hemodynamically significant arterial stenosis in the head or neck. Negative for dissection or intracranial aneurysm. 2. Multiple arterial anatomic variations, most notably right side aortic arch with abdomen and left subclavian artery. 3. No acute intracranial abnormality. Stable and negative noncontrast CT appearance of the brain. 4. 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 Consider annual imaging followup by CTA or MRA. This recommendation follows 2010 ACCF/AHA/AATS/ACR/ASA/SCA/SCAI/SIR/STS/SVM Guidelines for the Diagnosis and Management of Patients with Thoracic Aortic Disease. Circulation.2010; 121: O130-Q657. Electronically Signed   By: Genevie Ann M.D.   On: 02/10/2018 15:34   Ct Angio Neck W Or Wo Contrast  Result Date: 02/10/2018 CLINICAL DATA:  57 year old female with right upper extremity weakness. Unequal pupils, anisocoria. Known right-sided aortic arch with  aberrancy left subclavian artery. EXAM: CT ANGIOGRAPHY HEAD AND NECK TECHNIQUE: Multidetector CT imaging of the head and neck was performed using the standard protocol during bolus administration of intravenous contrast. Multiplanar CT image reconstructions and MIPs were obtained to evaluate the vascular anatomy. Carotid stenosis measurements (when applicable) are obtained utilizing NASCET criteria, using the distal internal carotid diameter as the denominator. CONTRAST:  66m ISOVUE-370 IOPAMIDOL (ISOVUE-370) INJECTION 76% COMPARISON:  Brain MRI 0437 hours today. Head CT without contrast 02/09/2018, and earlier. Chest CTA 02/12/2015 FINDINGS: CT HEAD Brain: Stable gray-white matter differentiation throughout the brain. No midline shift, ventriculomegaly, mass effect, evidence of mass lesion, intracranial hemorrhage or evidence of cortically based acute infarction. Calvarium and skull base: Negative. Paranasal sinuses: Stable paranasal sinuses, tympanic cavities, and mastoids with mild bilateral mucosal thickening and mastoid effusions. Orbits: Visualized orbit soft tissues are within normal limits. Visualized scalp soft tissues are within normal limits. CTA NECK Skeleton: No acute osseous abnormality identified. 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 Mild upper thoracic scoliosis. Upper chest: Patchy peripheral upper lung opacity, probably atelectasis. No superior mediastinal lymphadenopathy. Other neck: The bilateral submandibular glands and parotid glands are completely fatty atrophied. Otherwise negative neck soft tissues. No neck mass or lymphadenopathy. Mild motion artifact at the thoracic inlet. Aortic arch: Right side aortic arch with aberrancy left subclavian artery origin and ectatic distal arch measuring up to 31 millimeters diameter. This appears stable since 2016. No definite superimposed arch atherosclerosis. Right  carotid system: Separate right CCA origin from the right side aortic arch is patent without stenosis. There is an early bifurcation of the right carotid artery occurring at the mid thyroid level. No atherosclerosis or stenosis. Mildly tortuous but otherwise normal right ICA to the skull base. Left carotid system: No left CCA origin stenosis. Calcified  plaque in the distal left CCA just proximal to and leading up to the bifurcation continuing into the left ICA origin and bulb. However, no stenosis occurs. Mildly tortuous and otherwise negative left ICA to the skull base. Vertebral arteries: No proximal right subclavian artery stenosis. The right vertebral artery origin is early and without atherosclerosis or stenosis. The right vertebral artery is patent to the skull base without stenosis. Aberrancy left subclavian artery origin from the right side aortic arch with mild infundibulum. Mild proximal left subclavian calcified plaque without stenosis. The left vertebral artery origin appears normal (series 16, image 149). The left vertebral artery appears patent to the skull base without atherosclerosis or stenosis. CTA HEAD Posterior circulation: There is moderate to severe bilateral proximal V4 segment calcified plaque. Both distal vertebral arteries remain patent. There is mild stenosis on the left and moderate stenosis on the right. The distal left vertebral artery is mildly dominant. Normal PICA origins. Patent vertebrobasilar junction and basilar artery without plaque or stenosis. Normal SCA and PCA origins. Diminutive right posterior communicating artery, the left is smaller or absent. Bilateral PCA branches are within normal limits. Anterior circulation: Both ICA siphons are patent. There is mild to moderate calcified plaque on the left with no significant stenosis. There is mild calcified plaque on the right with no significant stenosis. Patent carotid termini. Normal MCA and ACA origins. Anterior communicating  artery and bilateral ACA branches appear normal. The left MCA bifurcates early. The left M1, bifurcation, and left MCA branches are within normal limits. The right MCA M1 segment, bifurcation, and right MCA branches are within normal limits (simplified right MCA branching pattern). Venous sinuses: Patent on the delayed images. Anatomic variants: Right side aortic arch with aberrancy left subclavian artery origin. Early right carotid bifurcation (at the thyroid level). Early right vertebral artery origin. Mildly dominant left vertebral artery. Early branching of the left MCA. Simplified right MCA branching pattern. Delayed phase: No abnormal enhancement identified. Review of the MIP images confirms the above findings IMPRESSION: 1. Mild atherosclerosis with no hemodynamically significant arterial stenosis in the head or neck. Negative for dissection or intracranial aneurysm. 2. Multiple arterial anatomic variations, most notably right side aortic arch with abdomen and left subclavian artery. 3. No acute intracranial abnormality. Stable and negative noncontrast CT appearance of the brain. 4. 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 Consider annual imaging followup by CTA or MRA. This recommendation follows 2010 ACCF/AHA/AATS/ACR/ASA/SCA/SCAI/SIR/STS/SVM Guidelines for the Diagnosis and Management of Patients with Thoracic Aortic Disease. Circulation.2010; 121: F643-P295. Electronically Signed   By: Genevie Ann M.D.   On: 02/10/2018 15:34        Scheduled Meds: . benztropine  0.5 mg Oral BID  . calcium citrate  200 mg of elemental calcium Oral Daily  . chlorhexidine  15 mL Mouth Rinse BID  . insulin aspart  0-15 Units Subcutaneous TID WC  . insulin aspart  0-5 Units Subcutaneous QHS  . insulin glargine  30 Units Subcutaneous Daily  . levETIRAcetam  1,000 mg Oral BID  . lurasidone  40 mg Oral BID  . mouth rinse  15 mL Mouth Rinse q12n4p  .  metoprolol succinate  50 mg Oral Daily  . mupirocin cream   Topical Daily  . pantoprazole  40 mg Oral QHS  . phosphorus  500 mg Oral BID  . potassium chloride  40 mEq Oral Daily  . saccharomyces boulardii  250 mg Oral BID  .  warfarin  5 mg Oral ONCE-1800  . Warfarin - Pharmacist Dosing Inpatient   Does not apply q1800   Continuous Infusions: . sodium chloride Stopped (02/08/18 1142)  . ampicillin-sulbactam (UNASYN) IV Stopped (02/12/18 8657)  . heparin 1,400 Units/hr (02/12/18 0827)     LOS: 16 days    Time spent: 35 minutes.     Elmarie Shiley, MD [facility] Pager [phone]  If 7PM-7AM, please contact night-coverage [email or web address] Password [email or web address] 02/12/2018, 1:10 PM

## 2018-02-12 NOTE — Clinical Social Work Note (Signed)
PASARR obtained: [id number] [phone] [id number]. SNF liaison notified.  Charlynn CourtSarah Maeryn Mcgath, CSW [phone]

## 2018-02-12 NOTE — Progress Notes (Addendum)
ANTICOAGULATION CONSULT NOTE - Follow Up Consult  Pharmacy Consult for Lovenox > warfarin Indication: atrial fibrillation  Allergies  Allergen Reactions  . Sulfa Antibiotics Rash    Patient Measurements: Height: 5\' 2"  (157.5 cm) Weight: 159 lb 9.8 oz (72.4 kg) IBW/kg (Calculated) : 50.1 Heparin Dosing Weight: 68 kg  Vital Signs: Temp: 97.9 F (36.6 C) (04/12 1214) Temp Source: Oral (04/12 1214) BP: 145/64 (04/12 1214) Pulse Rate: 71 (04/12 0922)  Labs: Recent Labs    02/07/18 0206  02/07/18 1538 02/08/18 0220 02/08/18 0723 02/09/18 0351 02/09/18 1315  HGB 9.8*  --   --  10.4*  --  9.1*  --   HCT 31.3*  --   --  33.2*  --  29.7*  --   PLT 409*  --   --  433*  --  282  --   LABPROT  --   --  15.3* 15.0  --  18.0*  --   INR  --   --  1.22 1.19  --  1.50  --   HEPARINUNFRC 0.89*   < >  --  0.56  --  0.86* 0.27*  CREATININE 0.99  --   --   --  1.06* 0.96  --    < > = values in this interval not displayed.    Estimated Creatinine Clearance: 61.7 mL/min (by C-G formula based on SCr of 0.96 mg/dL).  Assessment: [age] yoF with a history of AFib on warfarin PTA found unresponsive at home. Pharmacy consulted to start IV heparin while holding oral AC (previously failed swallow evaluation this admit). Warfarin resumed 4/9. Now transitioning from heparin bridge to Lovenox bridge while INR is subtherapeutic.  INR down this am to 1.54. Hgb stable, plt wnl. No bleeding documented. Noted resumed amiodarone from PTA 4/11, stopped 4/13 d/t prolonged qtc.  Home warfarin dose: 2mg  daily except 3mg  on Sat and Sun  Goal of Therapy:   INR 2-3 Monitor platelets by anticoagulation protocol: Yes   Plan:  -D/C heparin -Start Lovenox 75mg  (1mg /kg) sub-Q BID -Warfarin 5mg  tonight  Ruben Imony Maurica Omura, PharmD Clinical Pharmacist 02/12/2018 8:25 AM

## 2018-02-12 NOTE — Plan of Care (Signed)
Continue current care plan 

## 2018-02-13 DIAGNOSIS — Z5181 Encounter for therapeutic drug level monitoring: Secondary | ICD-10-CM | POA: Diagnosis not present

## 2018-02-13 DIAGNOSIS — Z79899 Other long term (current) drug therapy: Secondary | ICD-10-CM | POA: Diagnosis not present

## 2018-02-13 DIAGNOSIS — N390 Urinary tract infection, site not specified: Secondary | ICD-10-CM | POA: Diagnosis not present

## 2018-02-13 DIAGNOSIS — R9431 Abnormal electrocardiogram [ECG] [EKG]: Secondary | ICD-10-CM | POA: Diagnosis not present

## 2018-02-13 DIAGNOSIS — I080 Rheumatic disorders of both mitral and aortic valves: Secondary | ICD-10-CM | POA: Diagnosis not present

## 2018-02-13 DIAGNOSIS — S8000XD Contusion of unspecified knee, subsequent encounter: Secondary | ICD-10-CM | POA: Diagnosis not present

## 2018-02-13 DIAGNOSIS — F209 Schizophrenia, unspecified: Secondary | ICD-10-CM | POA: Diagnosis not present

## 2018-02-13 DIAGNOSIS — R6521 Severe sepsis with septic shock: Secondary | ICD-10-CM | POA: Diagnosis not present

## 2018-02-13 DIAGNOSIS — F319 Bipolar disorder, unspecified: Secondary | ICD-10-CM | POA: Diagnosis not present

## 2018-02-13 DIAGNOSIS — E785 Hyperlipidemia, unspecified: Secondary | ICD-10-CM | POA: Diagnosis not present

## 2018-02-13 DIAGNOSIS — F411 Generalized anxiety disorder: Secondary | ICD-10-CM | POA: Diagnosis not present

## 2018-02-13 DIAGNOSIS — I4581 Long QT syndrome: Secondary | ICD-10-CM | POA: Diagnosis not present

## 2018-02-13 DIAGNOSIS — J9600 Acute respiratory failure, unspecified whether with hypoxia or hypercapnia: Secondary | ICD-10-CM | POA: Diagnosis not present

## 2018-02-13 DIAGNOSIS — R4182 Altered mental status, unspecified: Secondary | ICD-10-CM | POA: Diagnosis not present

## 2018-02-13 DIAGNOSIS — I4510 Unspecified right bundle-branch block: Secondary | ICD-10-CM | POA: Diagnosis not present

## 2018-02-13 DIAGNOSIS — Z86718 Personal history of other venous thrombosis and embolism: Secondary | ICD-10-CM | POA: Diagnosis not present

## 2018-02-13 DIAGNOSIS — I4891 Unspecified atrial fibrillation: Secondary | ICD-10-CM | POA: Diagnosis not present

## 2018-02-13 DIAGNOSIS — M6281 Muscle weakness (generalized): Secondary | ICD-10-CM | POA: Diagnosis not present

## 2018-02-13 DIAGNOSIS — R296 Repeated falls: Secondary | ICD-10-CM | POA: Diagnosis not present

## 2018-02-13 DIAGNOSIS — E1169 Type 2 diabetes mellitus with other specified complication: Secondary | ICD-10-CM | POA: Diagnosis not present

## 2018-02-13 DIAGNOSIS — E871 Hypo-osmolality and hyponatremia: Secondary | ICD-10-CM | POA: Diagnosis not present

## 2018-02-13 DIAGNOSIS — R2689 Other abnormalities of gait and mobility: Secondary | ICD-10-CM | POA: Diagnosis not present

## 2018-02-13 DIAGNOSIS — J9601 Acute respiratory failure with hypoxia: Secondary | ICD-10-CM | POA: Diagnosis not present

## 2018-02-13 DIAGNOSIS — K449 Diaphragmatic hernia without obstruction or gangrene: Secondary | ICD-10-CM | POA: Diagnosis not present

## 2018-02-13 DIAGNOSIS — R197 Diarrhea, unspecified: Secondary | ICD-10-CM | POA: Diagnosis not present

## 2018-02-13 DIAGNOSIS — R1312 Dysphagia, oropharyngeal phase: Secondary | ICD-10-CM | POA: Diagnosis not present

## 2018-02-13 DIAGNOSIS — R279 Unspecified lack of coordination: Secondary | ICD-10-CM | POA: Diagnosis not present

## 2018-02-13 DIAGNOSIS — I10 Essential (primary) hypertension: Secondary | ICD-10-CM | POA: Diagnosis not present

## 2018-02-13 DIAGNOSIS — G4089 Other seizures: Secondary | ICD-10-CM | POA: Diagnosis not present

## 2018-02-13 LAB — BASIC METABOLIC PANEL
Anion gap: 10 (ref 5–15)
BUN: 9 mg/dL (ref 6–20)
CO2: 22 mmol/L (ref 22–32)
Calcium: 8.1 mg/dL — ABNORMAL LOW (ref 8.9–10.3)
Chloride: 105 mmol/L (ref 101–111)
Creatinine, Ser: 0.88 mg/dL (ref 0.44–1.00)
GFR calc Af Amer: >60 mL/min (ref >60)
GFR calc non Af Amer: >60 mL/min (ref >60)
Glucose, Bld: 159 mg/dL — ABNORMAL HIGH (ref 65–99)
POTASSIUM: 4.1 mmol/L (ref 3.5–5.1)
SODIUM: 137 mmol/L (ref 135–145)

## 2018-02-13 LAB — CBC
HEMATOCRIT: 31.9 % — AB (ref 36.0–46.0)
Hemoglobin: 10.4 g/dL — ABNORMAL LOW (ref 12.0–15.0)
MCH: 30.9 pg (ref 26.0–34.0)
MCHC: 32.6 g/dL (ref 30.0–36.0)
MCV: 94.7 fL (ref 78.0–100.0)
Platelets: 279 10*3/uL (ref 150–400)
RBC: 3.37 MIL/uL — ABNORMAL LOW (ref 3.87–5.11)
RDW: 14.7 % (ref 11.5–15.5)
WBC: 8.5 10*3/uL (ref 4.0–10.5)

## 2018-02-13 LAB — GLUCOSE, CAPILLARY
GLUCOSE-CAPILLARY: 121 mg/dL — AB (ref 65–99)
Glucose-Capillary: 204 mg/dL — ABNORMAL HIGH (ref 65–99)

## 2018-02-13 LAB — MAGNESIUM: MAGNESIUM: 1.7 mg/dL (ref 1.7–2.4)

## 2018-02-13 LAB — CULTURE, BLOOD (ROUTINE X 2)
Culture: NO GROWTH
Culture: NO GROWTH
Special Requests: ADEQUATE
Special Requests: ADEQUATE

## 2018-02-13 LAB — PROTIME-INR
INR: 1.83
Prothrombin Time: 21 seconds — ABNORMAL HIGH (ref 11.4–15.2)

## 2018-02-13 LAB — PHOSPHORUS: Phosphorus: 3.7 mg/dL (ref 2.5–4.6)

## 2018-02-13 MED ORDER — WARFARIN SODIUM 5 MG PO TABS: 5.0000 mg | ORAL_TABLET | Freq: Every day | ORAL | 0 refills | Status: AC

## 2018-02-13 MED ORDER — INSULIN GLARGINE 100 UNIT/ML ~~LOC~~ SOLN: 30.0000 [IU] | Freq: Every day | SUBCUTANEOUS | 11 refills | Status: AC

## 2018-02-13 MED ORDER — CALCIUM CITRATE 950 (200 CA) MG PO TABS: 200.0000 mg | ORAL_TABLET | Freq: Every day | ORAL | 0 refills | Status: AC

## 2018-02-13 MED ORDER — SACCHAROMYCES BOULARDII 250 MG PO CAPS: 250.0000 mg | ORAL_CAPSULE | Freq: Two times a day (BID) | ORAL | 0 refills | Status: AC

## 2018-02-13 MED ORDER — METOPROLOL SUCCINATE ER 50 MG PO TB24: 50.0000 mg | ORAL_TABLET | Freq: Every day | ORAL | 0 refills | Status: AC

## 2018-02-13 MED ORDER — DIAZEPAM 2 MG PO TABS: 2.0000 mg | ORAL_TABLET | Freq: Two times a day (BID) | ORAL | 0 refills | Status: DC | PRN

## 2018-02-13 MED ORDER — MAGNESIUM OXIDE 400 (241.3 MG) MG PO TABS: 200.0000 mg | ORAL_TABLET | Freq: Two times a day (BID) | ORAL | Status: DC

## 2018-02-13 MED ORDER — LURASIDONE HCL 40 MG PO TABS: 40.0000 mg | ORAL_TABLET | Freq: Two times a day (BID) | ORAL | 0 refills | Status: AC

## 2018-02-13 MED ORDER — LEVETIRACETAM 1000 MG PO TABS: 1000.0000 mg | ORAL_TABLET | Freq: Two times a day (BID) | ORAL | 0 refills | Status: AC

## 2018-02-13 MED ORDER — ENOXAPARIN SODIUM 80 MG/0.8ML ~~LOC~~ SOLN: 75.0000 mg | Freq: Two times a day (BID) | SUBCUTANEOUS | 0 refills | Status: DC

## 2018-02-13 MED ORDER — MAGNESIUM OXIDE 400 (241.3 MG) MG PO TABS: 200.0000 mg | ORAL_TABLET | Freq: Two times a day (BID) | ORAL | 0 refills | Status: AC

## 2018-02-13 MED ORDER — MAGNESIUM SULFATE 2 GM/50ML IV SOLN
2.0000 g | Freq: Once | INTRAVENOUS | Status: AC
  Administered 2018-02-13: 2 g via INTRAVENOUS
  Filled 2018-02-13: qty 50

## 2018-02-13 MED ORDER — WARFARIN SODIUM 5 MG PO TABS: 5.0000 mg | ORAL_TABLET | Freq: Once | ORAL | Status: DC

## 2018-02-13 MED ORDER — BENZTROPINE MESYLATE 0.5 MG PO TABS: 0.5000 mg | ORAL_TABLET | Freq: Two times a day (BID) | ORAL | 0 refills | Status: AC

## 2018-02-13 NOTE — Progress Notes (Signed)
ANTICOAGULATION CONSULT NOTE - Follow Up Consult  Pharmacy Consult for Lovenox > warfarin Indication: atrial fibrillation  Allergies  Allergen Reactions  . Sulfa Antibiotics Rash   Patient Measurements: Height: 5\' 2"  (157.5 cm) Weight: 159 lb 9.8 oz (72.4 kg) IBW/kg (Calculated) : 50.1 Heparin Dosing Weight: 68 kg  Vital Signs: Temp: 97.9 F (36.6 C) (04/12 1214) Temp Source: Oral (04/12 1214) BP: 145/64 (04/12 1214) Pulse Rate: 71 (04/12 0922)  Labs: Recent Labs    02/07/18 0206  02/07/18 1538 02/08/18 0220 02/08/18 0723 02/09/18 0351 02/09/18 1315  HGB 9.8*  --   --  10.4*  --  9.1*  --   HCT 31.3*  --   --  33.2*  --  29.7*  --   PLT 409*  --   --  433*  --  282  --   LABPROT  --   --  15.3* 15.0  --  18.0*  --   INR  --   --  1.22 1.19  --  1.50  --   HEPARINUNFRC 0.89*   < >  --  0.56  --  0.86* 0.27*  CREATININE 0.99  --   --   --  1.06* 0.96  --    < > = values in this interval not displayed.   Estimated Creatinine Clearance: 61.7 mL/min (by C-G formula based on SCr of 0.96 mg/dL).  Assessment: [age] yoF with a history of AFib on warfarin PTA found unresponsive at home. Pharmacy consulted to start IV heparin while holding oral AC (previously failed swallow evaluation this admit). Warfarin resumed 4/9. Now transitioning from heparin bridge to Lovenox bridge while INR is subtherapeutic.  INR up this am: 1.54>>1.83. Hgb stable, plt wnl. No bleeding documented. Noted resumed amiodarone from PTA 4/11, stopped 4/13 d/t prolonged qtc.  Home warfarin dose: 2mg  daily except 3mg  on Sat and Sun  Goal of Therapy:   INR 2-3 Monitor platelets by anticoagulation protocol: Yes   Plan:  -Lovenox 75mg  (1mg /kg) sub-Q BID -Warfarin 5mg  tonight -Monitor INR/CBC  Ruben Imony Evett Kassa, PharmD Clinical Pharmacist 02/13/2018 10:03 AM

## 2018-02-13 NOTE — Progress Notes (Signed)
0715 Bedside shift report. Pt resting in bed, alert, stating she's "wet". Night RN checked pt and purewick. Pt with small amount of leakage, pad changed under pt. Pt pulled up in bed and repositioned. VSS, pt denies pain. Pt asking for sister, [place]. RN informed pt will call after report. Fall precautions in place, WCTM  0800 Pt given bath, full linen change, pt assisted up to recliner. Pt tolerated well. Still asking for sister. RN called [place], pt's sister, no answer and could not leave message.   0915 Pt medicated per MAR. Pt's sister at bedside. Updated with POC. Pt eating breakfast. Medications given crushed with applesauce, pt tolerated well.   1030 Pt sitting in recliner coloring with markers. Stated a man said she could go to rehab tomorrow. Pt denies pain. NAD.   1115 RN to room to check on pt and give bedside shift report with Babette Relicammy, RN. Pt up on BSC. Stated she, "had to go." Reminded pt she needs to call before getting up. Pt demonstrated how to call on the call bell. NAD.

## 2018-02-13 NOTE — Clinical Social Work Note (Signed)
CSW facilitated patient discharge including contacting patient family and facility to confirm patient discharge plans. Clinical information faxed to facility and family agreeable with plan. CSW arranged ambulance transport via [facility] to [facility]. RN has already called report.  CSW will sign off for now as social work intervention is no longer needed. Please consult us again if new needs arise.  Charlynn CourtSarah Izadora Roehr, CSW [phone]

## 2018-02-13 NOTE — Discharge Summary (Addendum)
Physician Discharge Summary  Natasha Martin:[id number] DOB: [birth year] DOA: 01/27/2018  PCP: Toma Deiters, MD  Admit date: 01/27/2018 Discharge date: 02/13/2018  Admitted From: Home  Disposition:  SNF  Recommendations for Outpatient Follow-up:  1. Follow up with PCP in 1-2 weeks 2. Please obtain BMP/CBC, Mg, phosphorus in 48 hours. Replete electrolytes as needed.  3. Please check INR daily. Discontinue Lovenox when INR more than 2. Adjust coumadin dose depending on level.  4. Patient might need valium PRN at Bedtime to help with sleep.  5. Check EKG to monitor QT interval. Might be able to increase latuda to prior home dose of 80 mg BID if QT not prolong.     Discharge Condition: Stable.  CODE STATUS: Full code.  Diet recommendation: Dysphagia 3 diet   Brief/Interim Summary: Brief Narrative: this 57 year old with diabetes felt mental delay and schizophrenia who also has a reported history of seizures in the chart. For the past 2-3 days she has been having a productive cough and she was found in the bathroom this morning unresponsive. When EMS arrived her saturations were in the 20s. She was taking to [facility] where she was intubated and chest x-ray has shown diffuse bilateral infiltrates.Admitted3/30/2019  Significant Events: 01/27/2018: Transferred from [facility]; intubated; CXR with bilateral infiltrates 01/28/2018: ARDS protocol; Nimbex started 01/30/2018: Nimbex stopped; required propofol > drop in BP and levo started 01/31/2018: Bronchoscopy(cultures with yeast only) 02/05/2018: Extubated, A-line out  Assessment & Plan:   Principal Problem:   Schizophrenia (HCC) Active Problems:   Acute respiratory failure (HCC)   ARDS (adult respiratory distress syndrome) (HCC)  Acute resp failure2/2ARDSlikely 2/2aspirationevent.  Extubated 4-08. Now on 1 L oxygen.  Received 7 days of antibiotics.  Hold lasix. Taper off solumedrol.  Fevers; 4-11. Cover for  aspiration; now afebrile.  Pan-culture. blood culture 4-11; no growth to date. . Chest x ray improved  Unasyn to cover for aspiration. Started 4-11 plan to discontinue 4-15 to complete 5 days treatment,.  resolved.   Prolong Qt ; Magnesium was replaced.  Hold Seroquel.  Monitor QT. restarted on latuda, lower dose.  EKG today with decreased  QT Monitor electrolytes.  Discharge on magnesium supplementation.   Schizophrenia, depression;  Appreciate Psych;  Continue cogentin.  Patient develops prolongation of QT up to 600. Psych medications adjusted. Electrolytes replaced.  restarted on latuda, lower dose. Patient stable. Received a dose of valium last night which help with sleep. Per psych might be able to increase latuda in 3 to 5 days if QT is not prolong. If increase latuda dose patient will need EKG to monitor QT interval.   Acute encephalopathy, AMS;  On 4-11 she was more lethargic, had fever 103. Neuro exam was limited due to AMS. She was off of her Psych med.  4-12;  right Upper extremity weakness and left pupil more dilated than right.  On heparin for A fib.  Neuro consulted. Appreciate help. MRI negative for stroke. CT head negative.   CT angio head neck due to anisocoria. CT angio head and neck no significant finding.  resolved.   A fib;  Resume lopressor.  Transition form heparin to lovenox.  Started  Coumadin.  Cardiology recommended holding amiodarone.  INR at 1.8. On Lovenox. Discontinue Lovenox if INR more than 2. Might be able to discontinue lovenox soon.  Monitor INR daily, suspect she will need to be back on home dose of 2 -3 mg of coumadin soon.   HTN;  Resume  metoprolol. Daily dose.  Hydralazine PRN.   Septic shock; Off levophed.  Treated with IV antibiotics.  C diff negative.   Aortic and mitral valve diseases;  Cardiology was consulted, no work up during this admission.  needs to follow up with cardiology  Anemia;  Hb at 3, on admission,  probably not accurate.  Hb now at 10.Follow trend.   DM On lantus and SSI.   Mild mental delay.  B 12 731. , TSH 0.8.   Seizure. ?  She was stared on  keppra on admission. Per sister patient was not taking ; carbamazepine.   Hypernatremia; hold lasix. Encourage oral intake. Resolved. Stop D 5 IV   Hypokalemia; replaced. Resolved. Monitor.  Hypophosphatemia; replaced orally. normalized.   Diarrhea; c diff negative. Started florastore.     Discharge Diagnoses:  Principal Problem:   Schizophrenia [facility]) Active Problems:   Acute respiratory failure (HCC)   ARDS (adult respiratory distress syndrome) [facility])    Discharge Instructions  Discharge Instructions    Increase activity slowly   Complete by:  As directed      Allergies as of 02/13/2018      Reactions   Sulfa Antibiotics Rash      Medication List    STOP taking these medications   acetaminophen 325 MG tablet Commonly known as:  TYLENOL   acetaminophen-codeine 300-30 MG tablet Commonly known as:  TYLENOL #3   amiodarone 200 MG tablet Commonly known as:  PACERONE   atorvastatin 40 MG tablet Commonly known as:  LIPITOR   carbamazepine 200 MG tablet Commonly known as:  TEGRETOL   cetirizine 10 MG tablet Commonly known as:  ZYRTEC   clotrimazole-betamethasone cream Commonly known as:  LOTRISONE   glimepiride 4 MG tablet Commonly known as:  AMARYL   glyBURIDE 5 MG tablet Commonly known as:  DIABETA   guaiFENesin-dextromethorphan 100-10 MG/5ML syrup Commonly known as:  ROBITUSSIN DM   haloperidol 10 MG tablet Commonly known as:  HALDOL   HYDROcodone-acetaminophen 5-325 MG tablet Commonly known as:  NORCO/VICODIN   insulin aspart 100 UNIT/ML injection Commonly known as:  novoLOG   OMEGA-3 FISH OIL PO   omeprazole 20 MG capsule Commonly known as:  PRILOSEC   ondansetron 4 MG tablet Commonly known as:  ZOFRAN   QUEtiapine 200 MG tablet Commonly known as:  SEROQUEL   sodium  chloride 1 g tablet   sucralfate 1 g tablet Commonly known as:  CARAFATE   traZODone 50 MG tablet Commonly known as:  DESYREL     TAKE these medications   benztropine 0.5 MG tablet Commonly known as:  COGENTIN Take 1 tablet (0.5 mg total) by mouth 2 (two) times daily. What changed:    medication strength  how much to take   calcium citrate 950 MG tablet Commonly known as:  CALCITRATE - dosed in mg elemental calcium Take 1 tablet (200 mg of elemental calcium total) by mouth daily. Start taking on:  02/14/2018   CRESTOR 20 MG tablet Generic drug:  rosuvastatin Take 20 mg by mouth daily.   diazepam 2 MG tablet Commonly known as:  VALIUM Take 1 tablet (2 mg total) by mouth every 12 (twelve) hours as needed for anxiety. What changed:    medication strength  how much to take  when to take this  reasons to take this   enoxaparin 80 MG/0.8ML injection Commonly known as:  LOVENOX Inject 0.75 mLs (75 mg total) into the skin 2 (two) times daily.  fenofibrate 145 MG tablet Commonly known as:  TRICOR Take 145 mg by mouth daily.   insulin glargine 100 UNIT/ML injection Commonly known as:  LANTUS Inject 0.3 mLs (30 Units total) into the skin daily. Start taking on:  02/14/2018 What changed:    how much to take  when to take this   levETIRAcetam 1000 MG tablet Commonly known as:  KEPPRA Take 1 tablet (1,000 mg total) by mouth 2 (two) times daily.   lurasidone 40 MG Tabs tablet Commonly known as:  LATUDA Take 1 tablet (40 mg total) by mouth 2 (two) times daily. What changed:    medication strength  how much to take   magnesium oxide 400 (241.3 Mg) MG tablet Commonly known as:  MAG-OX Take 0.5 tablets (200 mg total) by mouth 2 (two) times daily. Start taking on:  02/14/2018   metFORMIN 1000 MG tablet Commonly known as:  GLUCOPHAGE Take 1,000 mg by mouth 2 (two) times daily with a meal.   metoprolol succinate 50 MG 24 hr tablet Commonly known as:   TOPROL-XL Take 1 tablet (50 mg total) by mouth daily. Take with or immediately following a meal. Start taking on:  02/14/2018 What changed:    medication strength  how much to take  when to take this   saccharomyces boulardii 250 MG capsule Commonly known as:  FLORASTOR Take 1 capsule (250 mg total) by mouth 2 (two) times daily.   warfarin 5 MG tablet Commonly known as:  COUMADIN Take 1 tablet (5 mg total) by mouth daily. What changed:  Another medication with the same name was removed. Continue taking this medication, and follow the directions you see here.      Contact information for after-discharge care    Destination    [name] .   Service:  Skilled Nursing Contact information: [address] [place] [address] [phone]             Allergies  Allergen Reactions  . Sulfa Antibiotics Rash    Consultations:  CCM  Psych    Procedures/Studies: Ct Angio Head W Or Wo Contrast  Result Date: 02/10/2018 CLINICAL DATA:  57 year old female with right upper extremity weakness. Unequal pupils, anisocoria. 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 EXAM: CT ANGIOGRAPHY HEAD AND NECK TECHNIQUE: Multidetector CT imaging of the head and neck was performed using the standard protocol during bolus administration of intravenous contrast. Multiplanar CT image reconstructions and MIPs were obtained to evaluate the vascular anatomy. Carotid stenosis measurements (when applicable) are obtained utilizing NASCET criteria, using the distal internal carotid diameter as the denominator. CONTRAST:  50mL ISOVUE-370 IOPAMIDOL (ISOVUE-370) INJECTION 76% COMPARISON:  Brain MRI 0437 hours today. Head CT without contrast 02/09/2018, and earlier. Chest CTA 02/12/2015 FINDINGS: CT HEAD Brain: Stable gray-white matter differentiation throughout the brain. No midline shift, ventriculomegaly, mass effect, evidence of mass lesion,  intracranial hemorrhage or evidence of cortically based acute infarction. Calvarium and skull base: Negative. Paranasal sinuses: Stable paranasal sinuses, tympanic cavities, and mastoids with mild bilateral mucosal thickening and mastoid effusions. Orbits: Visualized orbit soft tissues are within normal limits. Visualized scalp soft tissues are within normal limits. CTA NECK Skeleton: No acute osseous abnormality identified. 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 Mild upper thoracic scoliosis. Upper chest: Patchy peripheral upper lung opacity, probably atelectasis. No superior mediastinal lymphadenopathy. Other neck: The bilateral submandibular glands and parotid glands are completely  fatty atrophied. Otherwise negative neck soft tissues. No neck mass or lymphadenopathy. Mild motion artifact at the thoracic inlet. Aortic arch: Right side aortic arch with aberrancy left subclavian artery origin and ectatic distal arch measuring up to 31 millimeters diameter. This appears stable since 2016. No definite superimposed arch atherosclerosis. Right carotid system: Separate right CCA origin from the right side aortic arch is patent without stenosis. There is an early bifurcation of the right carotid artery occurring at the mid thyroid level. No atherosclerosis or stenosis. Mildly tortuous but otherwise normal right ICA to the skull base. Left carotid system: No left CCA origin stenosis. Calcified plaque in the distal left CCA just proximal to and leading up to the bifurcation continuing into the left ICA origin and bulb. However, no stenosis occurs. Mildly tortuous and otherwise negative left ICA to the skull base. Vertebral arteries: No proximal right subclavian artery stenosis. The right vertebral artery origin is early and without atherosclerosis or stenosis. The right vertebral artery is patent to the skull base without stenosis.  Aberrancy left subclavian artery origin from the right side aortic arch with mild infundibulum. Mild proximal left subclavian calcified plaque without stenosis. The left vertebral artery origin appears normal (series 16, image 149). The left vertebral artery appears patent to the skull base without atherosclerosis or stenosis. CTA HEAD Posterior circulation: There is moderate to severe bilateral proximal V4 segment calcified plaque. Both distal vertebral arteries remain patent. There is mild stenosis on the left and moderate stenosis on the right. The distal left vertebral artery is mildly dominant. Normal PICA origins. Patent vertebrobasilar junction and basilar artery without plaque or stenosis. Normal SCA and PCA origins. Diminutive right posterior communicating artery, the left is smaller or absent. Bilateral PCA branches are within normal limits. Anterior circulation: Both ICA siphons are patent. There is mild to moderate calcified plaque on the left with no significant stenosis. There is mild calcified plaque on the right with no significant stenosis. Patent carotid termini. Normal MCA and ACA origins. Anterior communicating artery and bilateral ACA branches appear normal. The left MCA bifurcates early. The left M1, bifurcation, and left MCA branches are within normal limits. The right MCA M1 segment, bifurcation, and right MCA branches are within normal limits (simplified right MCA branching pattern). Venous sinuses: Patent on the delayed images. Anatomic variants: Right side aortic arch with aberrancy left subclavian artery origin. Early right carotid bifurcation (at the thyroid level). Early right vertebral artery origin. Mildly dominant left vertebral artery. Early branching of the left MCA. Simplified right MCA branching pattern. Delayed phase: No abnormal enhancement identified. Review of the MIP images confirms the above findings IMPRESSION: 1. Mild atherosclerosis with no hemodynamically significant  arterial stenosis in the head or neck. Negative for dissection or intracranial aneurysm. 2. Multiple arterial anatomic variations, most notably right side aortic arch with abdomen and left subclavian artery. 3. No acute intracranial abnormality. Stable and negative noncontrast CT appearance of the brain. 4. 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 Consider annual imaging followup by CTA or MRA. This recommendation follows 2010 ACCF/AHA/AATS/ACR/ASA/SCA/SCAI/SIR/STS/SVM Guidelines for the Diagnosis and Management of Patients with Thoracic Aortic Disease. Circulation.2010; 121: W098-J191. Electronically Signed   By: Odessa Fleming M.D.   On: 02/10/2018 15:34   Ct Head Wo Contrast  Result Date: 01/27/2018 CLINICAL DATA:  57 year old female with altered level of consciousness. EXAM: CT HEAD WITHOUT CONTRAST TECHNIQUE: Contiguous axial images were obtained from the base  of the skull through the vertex without intravenous contrast. COMPARISON:  02/11/2015 and prior CTs FINDINGS: Brain: No evidence of acute infarction, hemorrhage, hydrocephalus, extra-axial collection or mass lesion/mass effect. Mild chronic small-vessel white matter ischemic changes are again noted. Vascular: Mild atherosclerotic calcifications noted. Skull: Normal. Negative for fracture or focal lesion. Sinuses/Orbits: No acute abnormality. Mild RIGHT maxillary sinus mucosal thickening noted. Other: None. IMPRESSION: 1. No evidence of acute intracranial abnormality. 2. Mild chronic small-vessel white matter ischemic changes. Electronically Signed   By: Harmon Pier M.D.   On: 01/27/2018 18:08   Ct Angio Neck W Or Wo Contrast  Result Date: 02/10/2018 CLINICAL DATA:  57 year old female with right upper extremity weakness. Unequal pupils, anisocoria. 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 EXAM: CT ANGIOGRAPHY HEAD AND NECK TECHNIQUE: Multidetector CT imaging of  the head and neck was performed using the standard protocol during bolus administration of intravenous contrast. Multiplanar CT image reconstructions and MIPs were obtained to evaluate the vascular anatomy. Carotid stenosis measurements (when applicable) are obtained utilizing NASCET criteria, using the distal internal carotid diameter as the denominator. CONTRAST:  50mL ISOVUE-370 IOPAMIDOL (ISOVUE-370) INJECTION 76% COMPARISON:  Brain MRI 0437 hours today. Head CT without contrast 02/09/2018, and earlier. Chest CTA 02/12/2015 FINDINGS: CT HEAD Brain: Stable gray-white matter differentiation throughout the brain. No midline shift, ventriculomegaly, mass effect, evidence of mass lesion, intracranial hemorrhage or evidence of cortically based acute infarction. Calvarium and skull base: Negative. Paranasal sinuses: Stable paranasal sinuses, tympanic cavities, and mastoids with mild bilateral mucosal thickening and mastoid effusions. Orbits: Visualized orbit soft tissues are within normal limits. Visualized scalp soft tissues are within normal limits. CTA NECK Skeleton: No acute osseous abnormality identified. 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 Mild upper thoracic scoliosis. Upper chest: Patchy peripheral upper lung opacity, probably atelectasis. No superior mediastinal lymphadenopathy. Other neck: The bilateral submandibular glands and parotid glands are completely fatty atrophied. Otherwise negative neck soft tissues. No neck mass or lymphadenopathy. Mild motion artifact at the thoracic inlet. Aortic arch: Right side aortic arch with aberrancy left subclavian artery origin and ectatic distal arch measuring up to 31 millimeters diameter. This appears stable since 2016. No definite superimposed arch atherosclerosis. Right carotid system: Separate right CCA origin from the right side aortic arch is patent without stenosis. There  is an early bifurcation of the right carotid artery occurring at the mid thyroid level. No atherosclerosis or stenosis. Mildly tortuous but otherwise normal right ICA to the skull base. Left carotid system: No left CCA origin stenosis. Calcified plaque in the distal left CCA just proximal to and leading up to the bifurcation continuing into the left ICA origin and bulb. However, no stenosis occurs. Mildly tortuous and otherwise negative left ICA to the skull base. Vertebral arteries: No proximal right subclavian artery stenosis. The right vertebral artery origin is early and without atherosclerosis or stenosis. The right vertebral artery is patent to the skull base without stenosis. Aberrancy left subclavian artery origin from the right side aortic arch with mild infundibulum. Mild proximal left subclavian calcified plaque without stenosis. The left vertebral artery origin appears normal (series 16, image 149). The left vertebral artery appears patent to the skull base without atherosclerosis or stenosis. CTA HEAD Posterior circulation: There is moderate to severe bilateral proximal V4 segment calcified plaque. Both distal vertebral arteries remain patent. There is mild stenosis on the left and moderate stenosis on the right. The distal  left vertebral artery is mildly dominant. Normal PICA origins. Patent vertebrobasilar junction and basilar artery without plaque or stenosis. Normal SCA and PCA origins. Diminutive right posterior communicating artery, the left is smaller or absent. Bilateral PCA branches are within normal limits. Anterior circulation: Both ICA siphons are patent. There is mild to moderate calcified plaque on the left with no significant stenosis. There is mild calcified plaque on the right with no significant stenosis. Patent carotid termini. Normal MCA and ACA origins. Anterior communicating artery and bilateral ACA branches appear normal. The left MCA bifurcates early. The left M1, bifurcation,  and left MCA branches are within normal limits. The right MCA M1 segment, bifurcation, and right MCA branches are within normal limits (simplified right MCA branching pattern). Venous sinuses: Patent on the delayed images. Anatomic variants: Right side aortic arch with aberrancy left subclavian artery origin. Early right carotid bifurcation (at the thyroid level). Early right vertebral artery origin. Mildly dominant left vertebral artery. Early branching of the left MCA. Simplified right MCA branching pattern. Delayed phase: No abnormal enhancement identified. Review of the MIP images confirms the above findings IMPRESSION: 1. Mild atherosclerosis with no hemodynamically significant arterial stenosis in the head or neck. Negative for dissection or intracranial aneurysm. 2. Multiple arterial anatomic variations, most notably right side aortic arch with abdomen and left subclavian artery. 3. No acute intracranial abnormality. Stable and negative noncontrast CT appearance of the brain. 4. 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 Consider annual imaging followup by CTA or MRA. This recommendation follows 2010 ACCF/AHA/AATS/ACR/ASA/SCA/SCAI/SIR/STS/SVM Guidelines for the Diagnosis and Management of Patients with Thoracic Aortic Disease. Circulation.2010; 121: Z610-R604. Electronically Signed   By: Odessa Fleming M.D.   On: 02/10/2018 15:34   Mr Brain Wo Contrast  Result Date: 02/10/2018 CLINICAL DATA:  Unequal pupils, RIGHT-sided weakness in the context of continued encephalopathy. History of hypertension, hyperlipidemia, diabetes, seizures. EXAM: MRI HEAD WITHOUT CONTRAST TECHNIQUE: Multiplanar, multiecho pulse sequences of the brain and surrounding structures were obtained without intravenous contrast. COMPARISON:  CT HEAD February 09, 2018 FINDINGS: Some of the sequences are mildly motion degraded. INTRACRANIAL CONTENTS: No reduced diffusion to suggest acute  ischemia or hyperacute demyelination. RIGHT frontal chronic microhemorrhage. The ventricles and sulci are upper limits of normal for patient's age. Mild periventricular and pontine white matter FLAIR T2 hyperintensities compatible chronic small vessel ischemic disease. No suspicious parenchymal signal, masses, mass effect. No abnormal extra-axial fluid collections. No extra-axial masses. VASCULAR: Normal major intracranial vascular flow voids present at skull base. SKULL AND UPPER CERVICAL SPINE: No abnormal sellar expansion. No suspicious calvarial bone marrow signal. Craniocervical junction maintained. SINUSES/ORBITS: Mild paranasal sinus mucosal thickening. Bilateral mastoid effusions.The included ocular globes and orbital contents are non-suspicious. OTHER: None. IMPRESSION: 1. No acute intracranial process on this motion degraded examination. 2. Borderline parenchymal brain volume loss and mild chronic small vessel ischemic disease. Electronically Signed   By: Awilda Metro M.D.   On: 02/10/2018 05:34   Dg Chest Port 1 View  Result Date: 02/08/2018 CLINICAL DATA:  High fever.  Lethargy. EXAM: PORTABLE CHEST 1 VIEW COMPARISON:  02/05/2018 FINDINGS: Prominent bronchovascular markings. Mild bilateral interstitial opacities. No airspace consolidation. No convincing pleural effusion or pneumothorax. All lines and tubes have been removed. IMPRESSION: 1. Significantly improved lung aeration when compared to multiple studies. No new lung abnormalities. 2. Lines and tubes have been removed.  No pneumothorax. Electronically Signed   By: Renard Hamper.D.  On: 02/08/2018 09:07   Dg Chest Port 1 View  Result Date: 02/05/2018 CLINICAL DATA:  ARDS EXAM: PORTABLE CHEST 1 VIEW COMPARISON:  02/04/2018. FINDINGS: No change BILATERAL pulmonary opacities. Support tubes and lines are stable. No pneumothorax. Chronic deformity RIGHT shoulder. IMPRESSION: Stable exam. Electronically Signed   By: Elsie Stain M.D.   On:  02/05/2018 07:18   Dg Chest Port 1 View  Result Date: 02/04/2018 CLINICAL DATA:  Acute respiratory failure EXAM: PORTABLE CHEST 1 VIEW COMPARISON:  02/02/2018 FINDINGS: Endotracheal tube 2 cm above the carina unchanged. Right jugular central venous catheter tip in the SVC also unchanged. NG enters the stomach. No pneumothorax Improvement in diffuse bilateral airspace disease now most prominent in the bases. No effusion IMPRESSION: Endotracheal tube 2 cm above the carina unchanged Improvement in diffuse bilateral airspace disease. Electronically Signed   By: Marlan Palau M.D.   On: 02/04/2018 08:27   Dg Chest Port 1 View  Result Date: 02/02/2018 CLINICAL DATA:  ARDS EXAM: PORTABLE CHEST 1 VIEW COMPARISON:  02/01/2018; 01/28/2018; 01/27/2018 FINDINGS: Grossly unchanged cardiac silhouette and mediastinal contours. Stable positioning of support apparatus. Extensive bilateral heterogeneous consolidative airspace opacities are unchanged. No new focal airspace opacities. Trace pleural effusions are not excluded. No pneumothorax. No definite evidence of edema. Grossly unchanged bones including deformity of the right proximal humerus, incompletely evaluated. IMPRESSION: 1.  Stable positioning of support apparatus.  No pneumothorax. 2. Grossly unchanged extensive bilateral heterogeneous consolidative airspace opacities with broad differential considerations including alveolar pulmonary edema, multifocal infection (including atypical etiologies), aspiration and hemorrhage. Electronically Signed   By: Simonne Come M.D.   On: 02/02/2018 07:39   Dg Chest Port 1 View  Result Date: 02/01/2018 CLINICAL DATA:  Follow-up, ARDS EXAM: PORTABLE CHEST 1 VIEW COMPARISON:  01/31/2018 FINDINGS: Endotracheal tube, nasogastric catheter and right jugular central line are again seen and stable. Cardiac shadow is stable. There has been some increase in the degree of diffuse airspace disease when compare with the prior exam particularly  in the bases. Likely increasing effusions are present as well. No bony abnormality is noted. IMPRESSION: Increase in bibasilar infiltrative changes as well as effusions. Tubes and lines as described. Electronically Signed   By: Alcide Clever M.D.   On: 02/01/2018 09:27   Dg Chest Port 1 View  Result Date: 01/31/2018 CLINICAL DATA:  ARDS. EXAM: PORTABLE CHEST 1 VIEW COMPARISON:  One-view chest x-ray 01/30/2018 FINDINGS: Heart is enlarged. Endotracheal tube, NG tube, and right IJ line are stable. Interstitial and airspace disease bilaterally is similar the prior exam. Bilateral effusions are present. IMPRESSION: 1. Stable appearance of diffuse interstitial and airspace disease compatible with ARDS. 2. Support apparatus is stable. Electronically Signed   By: Marin Roberts M.D.   On: 01/31/2018 09:43   Dg Chest Port 1 View  Result Date: 01/30/2018 CLINICAL DATA:  Followup ARDS.  Ventilator support. EXAM: PORTABLE CHEST 1 VIEW COMPARISON:  01/29/2018. FINDINGS: Endotracheal tube tip is 3 cm above the carina. Nasogastric tube enters the abdomen. Right internal jugular central line tip is in the SVC above the right atrium. Persistent bilateral lower lobe volume loss and infiltrate, with worsened consolidation in the left lower lobe. Small amount of pleural fluid probably present. IMPRESSION: Worsened consolidation/volume loss in the left lower lobe. Persistent bilateral infiltrates. Electronically Signed   By: Paulina Fusi M.D.   On: 01/30/2018 07:07   Dg Chest Port 1 View  Result Date: 01/29/2018 CLINICAL DATA:  Hypoxia EXAM: PORTABLE CHEST 1 VIEW  COMPARISON:  January 28, 2018 FINDINGS: Endotracheal tube tip is 3.5 cm above the carina. Central catheter tip is in superior vena cava. Nasogastric tube tip and side port are below the diaphragm. No pneumothorax. There is airspace consolidation in both lower lung zones with small pleural effusions bilaterally. There is also a degree of underlying interstitial  pulmonary edema. Cardiomegaly is present with pulmonary vascularity within normal limits. There old healed rib fractures on the left. 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 IMPRESSION: Tube and catheter positions as described without pneumothorax. Airspace consolidation concerning for pneumonia both lower lung zones with small pleural effusions. There is cardiomegaly with mild interstitial edema. Old fracture right humerus. Electronically Signed   By: Bretta Bang III M.D.   On: 01/29/2018 07:39   Dg Chest Port 1 View  Result Date: 01/28/2018 CLINICAL DATA:  Adult respiratory distress syndrome. EXAM: PORTABLE CHEST 1 VIEW COMPARISON:  01/27/2018. FINDINGS: There is a right IJ catheter with tip in the projection of the distal SVC. The enteric tube tip is below the GE junction. The heart size appears within normal limits. There has been interval improvement in aeration to both upper lobes. Persistent lower lobe opacities. Chronic fracture of the proximal right humerus. IMPRESSION: Interval improvement in aeration of both upper lobes with persistent lower lobe opacities. Electronically Signed   By: Signa Kell M.D.   On: 01/28/2018 09:55   Dg Chest Portable 1 View  Result Date: 01/27/2018 CLINICAL DATA:  Central line placement EXAM: PORTABLE CHEST 1 VIEW COMPARISON:  Chest radiograph from earlier today. FINDINGS: Endotracheal tube tip is 1.0 cm above the carina. Enteric tube enters stomach with the tip not seen on this image. Right internal jugular central venous catheter terminates in the lower third of the superior vena cava. Left subclavian central venous catheter courses superiorly in the neck with the tip not seen on this image, unchanged. Stable cardiomediastinal silhouette with poorly visualized cardiac silhouette. No pneumothorax. No appreciable significant pleural effusion. Severe patchy consolidation throughout both lungs, unchanged. IMPRESSION: 1. Endotracheal  tube tip is 1.0 cm above the carina. Consider retracting 1.0 cm. 2. Malpositioned left subclavian central venous catheter coursing superiorly in the left neck. This catheter has reportedly been removed by the time of this dictation. 3. Persistent severe patchy consolidation throughout both lungs, which could represent multifocal pneumonia, severe pulmonary edema and/or ARDS. These results were called by telephone at the time of interpretation on 01/27/2018 at 4:07 pm to [facility] RN , who verbally acknowledged these results. Electronically Signed   By: Delbert Phenix M.D.   On: 01/27/2018 16:10   Dg Chest Portable 1 View  Result Date: 01/27/2018 CLINICAL DATA:  evaluate central line placement. EXAM: PORTABLE CHEST 1 VIEW COMPARISON:  None FINDINGS: Interval placement of left subclavian catheter. The catheter enters left IJ vein and courses cranially. The tip is not visualized. Enteric tube tip is below the level of the GE junction. Diffuse bilateral pulmonary opacities are identified. Not changed from previous exam. IMPRESSION: 1. Malpositioned left subclavian catheter which enters the left IJ vein and extends cranially. 2. No change in aeration to the lungs compared with previous exam. Electronically Signed   By: Signa Kell M.D.   On: 01/27/2018 14:44   Dg Chest Port 1 View  Result Date: 01/27/2018 CLINICAL DATA:  Intubation. EXAM: PORTABLE CHEST 1 VIEW COMPARISON:  01/20/2016 FINDINGS: Endotracheal tube tip 16 mm above the carina. An orogastric tube reaches the stomach  at least. Low lung volumes with extensive bilateral airspace opacity. No visible pneumothorax. Heart size and mediastinal contours are obscured. Partially visualized right humeral head which shows possible dislocation and fragmentation. There was a displaced fracture by 2016 radiography. Remote left rib fracture. IMPRESSION: 1. Endotracheal tube tip 16 mm above the carina. An orogastric tube reaches the stomach. 2. Extensive airspace  disease which could reflect pneumonia, cardiogenic edema, and/or ARDS. Electronically Signed   By: Marnee Spring M.D.   On: 01/27/2018 08:15   Dg Swallowing Func-speech Pathology  Result Date: 02/07/2018 Objective Swallowing Evaluation: Type of Study: MBS-Modified Barium Swallow Study  Patient Details Name: SAKSHI SERMONS MRN: [id number] Date of Birth: 1961/06/21 Today's Date: 02/07/2018 Time: SLP Start Time (ACUTE ONLY): 1020 -SLP Stop Time (ACUTE ONLY): 1034 SLP Time Calculation (min) (ACUTE ONLY): 14 min Past Medical History: Past Medical History: Diagnosis Date . Anxiety  . Diabetes (HCC)  . Dysphagia  . Hyperlipidemia  . Hypertension  . Mild mental slowing  . Poor historian  . Schizophrenia (HCC)  . Seizures (HCC)   has not had a seizure since last year. Unknown orgin- no family available to come and patient does not know. Past Surgical History: Past Surgical History: Procedure Laterality Date . BIOPSY  01/28/2014  Procedure: BIOPSY (Gastric);  Surgeon: West Bali, MD;  Location: [facility];  Service: Endoscopy;; . COLONOSCOPY WITH PROPOFOL N/A 01/28/2014  NWG:NFAOZH mucosa in the terminal ileum/small internal and external hemorrhoids . ESOPHAGOGASTRODUODENOSCOPY (EGD) WITH PROPOFOL N/A 01/28/2014  YQM:VHQION esophagitis/stricture at the gastro junction/moderate erosive . 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 POLYPECTOMY  01/28/2014  Procedure: POLYPECTOMY (Rectal);  Surgeon: West Bali, MD;  Location: [facility];  Service: Endoscopy;; . SAVORY DILATION N/A 01/28/2014  Procedure: SAVORY DILATION  (12.44mm to 16mm);  Surgeon: West Bali, MD;  Location: [facility];  Service: Endoscopy;  Laterality: N/A; HPI: Pt is a 57 year old who presented to [facility] after being found unresponsive with report of productive cough, mental delay, and schizophrenia. She has a past medical history of Anxiety, Diabetes (HCC), Dysphagia, Hyperlipidemia, Hypertension, Mild mental slowing,  Poor historian, Schizophrenia (HCC), and Seizures (HCC). Surgical Hx includes Esophagogastroduodenoscopy (egd) with propofol (01/28/2014) and Savory dilation (01/28/2014). When EMS arrived her saturations were in the 20s. Pt was was intubated on arrival (01/27/18) and chest x-ray has showed diffuse bilateral infiltrates. ARDSlikely 2/2 aspirationevent. Extubated 4/8. CXR (4/9) reveals No change to BILATERAL pulmonary opacities. CT Head (3/30) revealed no evidence of acute intracranial abnormality and mild chronic small-vessel white matter ischemic changes.  Subjective: Pt alert with impaired comprehension but alert throughout session Assessment / Plan / Recommendation CHL IP CLINICAL IMPRESSIONS 02/07/2018 Clinical Impression -- SLP Visit Diagnosis Dysphagia, unspecified (R13.10) Attention and concentration deficit following -- Frontal lobe and executive function deficit following -- Impact on safety and function --   CHL IP TREATMENT RECOMMENDATION 02/07/2018 Treatment Recommendations Therapy as outlined in treatment plan below   Prognosis 02/07/2018 Prognosis for Safe Diet Advancement Good Barriers to Reach Goals Cognitive deficits Barriers/Prognosis Comment -- CHL IP DIET RECOMMENDATION 02/07/2018 SLP Diet Recommendations Honey thick liquids;Dysphagia 1 (Puree) solids Liquid Administration via Cup;No straw Medication Administration Crushed with puree Compensations Slow rate;Small sips/bites Postural Changes Seated upright at 90 degrees   CHL IP OTHER RECOMMENDATIONS 02/07/2018 Recommended Consults -- Oral Care Recommendations Oral care BID Other Recommendations --   CHL IP FOLLOW UP RECOMMENDATIONS 02/07/2018 Follow up Recommendations (No  Data)   CHL IP FREQUENCY AND DURATION 02/07/2018 Speech Therapy Frequency (ACUTE ONLY) min 2x/week Treatment Duration 2 weeks      CHL IP ORAL PHASE 02/07/2018 Oral Phase Impaired Oral - Pudding Teaspoon -- Oral - Pudding Cup -- Oral - Honey Teaspoon -- Oral - Honey Cup Lingual/palatal  residue;Decreased bolus cohesion;Right anterior bolus loss;Left anterior bolus loss Oral - Nectar Teaspoon -- Oral - Nectar Cup Right anterior bolus loss;Left anterior bolus loss;Lingual/palatal residue;Premature spillage;Decreased bolus cohesion Oral - Nectar Straw Right anterior bolus loss;Left anterior bolus loss;Lingual/palatal residue;Premature spillage;Decreased bolus cohesion Oral - Thin Teaspoon -- Oral - Thin Cup Right anterior bolus loss;Left anterior bolus loss;Lingual/palatal residue;Premature spillage;Decreased bolus cohesion Oral - Thin Straw -- Oral - Puree -- Oral - Mech Soft -- Oral - Regular Delayed oral transit;Weak lingual manipulation Oral - Multi-Consistency -- Oral - Pill -- Oral Phase - Comment --  CHL IP PHARYNGEAL PHASE 02/07/2018 Pharyngeal Phase Impaired Pharyngeal- Pudding Teaspoon -- Pharyngeal -- Pharyngeal- Pudding Cup -- Pharyngeal -- Pharyngeal- Honey Teaspoon -- Pharyngeal -- Pharyngeal- Honey Cup Pharyngeal residue - valleculae Pharyngeal -- Pharyngeal- Nectar Teaspoon -- Pharyngeal -- Pharyngeal- Nectar Cup Delayed swallow initiation-pyriform sinuses;Pharyngeal residue - valleculae Pharyngeal -- Pharyngeal- Nectar Straw Penetration/Aspiration during swallow;Reduced airway/laryngeal closure Pharyngeal Material enters airway, passes BELOW cords without attempt by patient to eject out (silent aspiration) Pharyngeal- Thin Teaspoon -- Pharyngeal -- Pharyngeal- Thin Cup Penetration/Aspiration before swallow;Penetration/Aspiration during swallow Pharyngeal Material enters airway, passes BELOW cords without attempt by patient to eject out (silent aspiration) Pharyngeal- Thin Straw -- Pharyngeal -- Pharyngeal- Puree -- Pharyngeal -- Pharyngeal- Mechanical Soft -- Pharyngeal -- Pharyngeal- Regular WFL Pharyngeal -- Pharyngeal- Multi-consistency -- Pharyngeal -- Pharyngeal- Pill -- Pharyngeal -- Pharyngeal Comment --  CHL IP CERVICAL ESOPHAGEAL PHASE 02/07/2018 Cervical Esophageal Phase  Impaired Pudding Teaspoon -- Pudding Cup -- Honey Teaspoon -- Honey Cup -- Nectar Teaspoon -- Nectar Cup -- Nectar Straw -- Thin Teaspoon -- Thin Cup -- Thin Straw -- Puree -- Mechanical Soft -- Regular -- Multi-consistency -- Pill -- Cervical Esophageal Comment -- No flowsheet data found. Royce Macadamia 02/07/2018, 1:47 PM  Breck Coons Lonell Face.Ed CCC-SLP Pager [phone]             Ct Head Code Stroke Wo Contrast`  Result Date: 02/09/2018 CLINICAL DATA:  Code stroke. Altered mental status. Asymmetric pupil dilation. EXAM: CT HEAD WITHOUT CONTRAST TECHNIQUE: Contiguous axial images were obtained from the base of the skull through the vertex without intravenous contrast. COMPARISON:  CT head without contrast 01/27/2018 FINDINGS: Brain: Mild atrophy and white matter changes are stable. No acute infarct, hemorrhage, or mass lesion is present. Ventricles are of normal size. No significant extra-axial fluid collection is present. The brainstem and cerebellum are normal. Vascular: Atherosclerotic changes are present in the cavernous internal carotid arteries and at the dural margin of both vertebral arteries. No hyperdense vessel is present. Skull: Calvarium is intact. 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 No focal lytic or blastic lesions are present. Sinuses/Orbits: Mucosal thickening is present inferiorly in the right maxillary sinus. There is minimal mucosal thickening in the posterior sphenoid sinuses bilaterally. Frontal sinuses are clear. Globes and orbits are within normal limits. ASPECTS [facility] Stroke Program Early CT Score) - Ganglionic level infarction (caudate, lentiform nuclei, internal capsule, insula, M1-M3 cortex): 7/7 - Supraganglionic infarction (M4-M6 cortex): 3/3 Total score (0-10 with 10 being normal): 10/10 IMPRESSION: 1. No acute intracranial abnormality or significant interval change. 2. Stable mild atrophy and  white matter disease. 3. ASPECTS is 10/10 The above  was relayed via text pager to Dr. Otelia Limes on 02/09/2018 at 10:17 . Electronically Signed   By: Marin Roberts M.D.   On: 02/09/2018 10:19   [place] Ekg Site Rite  Result Date: 01/30/2018 If Site Rite image not attached, placement could not be confirmed due to current cardiac rhythm.    Subjective: Feeling ok. Per report patient was able to sleep last night.   Discharge Exam: Vitals:   02/13/18 0700 02/13/18 1100  BP: 126/80 133/66  Pulse: 65 71  Resp: 18 15  Temp: 98.5 F (36.9 C) 98.6 F (37 C)  SpO2: 98% 99%   Vitals:   02/13/18 0352 02/13/18 0426 02/13/18 0700 02/13/18 1100  BP: (!) 127/59  126/80 133/66  Pulse: 61  65 71  Resp: 16  18 15   Temp: 98.7 F (37.1 C)  98.5 F (36.9 C) 98.6 F (37 C)  TempSrc: Oral  Oral Oral  SpO2: 100%  98% 99%  Weight:  73.2 kg (161 lb 6 oz)    Height:        General: Pt is alert, awake, not in acute distress Cardiovascular: RRR, S1/S2 +, no rubs, no gallops Respiratory: CTA bilaterally, no wheezing, no rhonchi Abdominal: Soft, NT, ND, bowel sounds + Extremities: no edema, no cyanosis    The results of significant diagnostics from this hospitalization (including imaging, microbiology, ancillary and laboratory) are listed below for reference.     Microbiology: Recent Results (from the past 240 hour(s))  Culture, blood (routine x 2)     Status: None (Preliminary result)   Collection Time: 02/08/18  8:45 AM  Result Value Ref Range Status   Specimen Description BLOOD RIGHT HAND  Final   Special Requests   Final    BOTTLES DRAWN AEROBIC AND ANAEROBIC Blood Culture adequate volume   Culture   Final    NO GROWTH 4 DAYS Performed at [facility], [address] [place], [place] [address]    Report Status PENDING  Incomplete  Culture, blood (routine x 2)     Status: None (Preliminary result)   Collection Time: 02/08/18  8:45 AM  Result Value Ref Range Status   Specimen Description BLOOD LEFT HAND  Final   Special Requests    Final    BOTTLES DRAWN AEROBIC AND ANAEROBIC Blood Culture adequate volume   Culture   Final    NO GROWTH 4 DAYS Performed at [facility], [address], [place], [place] [address]    Report Status PENDING  Incomplete  Urine Culture     Status: None   Collection Time: 02/08/18 10:00 AM  Result Value Ref Range Status   Specimen Description URINE, CATHETERIZED  Final   Special Requests NONE  Final   Culture   Final    NO GROWTH Performed at [facility], [address] [place], [place] [address]    Report Status 02/09/2018 FINAL  Final  C difficile quick scan w PCR reflex     Status: None   Collection Time: 02/10/18 10:07 PM  Result Value Ref Range Status   C Diff antigen NEGATIVE NEGATIVE Final   C Diff toxin NEGATIVE NEGATIVE Final   C Diff interpretation No C. difficile detected.  Final    Comment: Performed at [facility], [address] [place], [place] [address]     Labs: BNP (last 3 results) No results for input(s): BNP  in the last 8760 hours. Basic Metabolic Panel: Recent Labs  Lab 02/10/18 1548 02/10/18 2053 02/11/18 0407 02/11/18 1939 02/12/18 0234 02/13/18 0216  NA 135  --  138 138 138 137  K 3.6  --  3.7 3.3* 3.8 4.1  CL 108  --  107 108 106 105  CO2 21*  --  22 22 23 22   GLUCOSE 192*  --  218* 116* 226* 159*  BUN 10  --  14 10 11 9   CREATININE 0.67  --  0.82 0.78 0.80 0.88  CALCIUM 7.4*  --  7.7* 7.6* 7.8* 8.1*  MG 1.4*  --  2.2  --  2.0 1.7  PHOS  --  2.1* 2.3*  --  2.7 3.7   Liver Function Tests: Recent Labs  Lab 02/09/18 0351 02/11/18 0823  AST 19  --   ALT 22  --   ALKPHOS 79  --   BILITOT 0.6  --   PROT 5.6*  --   ALBUMIN 2.4* 2.5*   No results for input(s): LIPASE, AMYLASE in the last 168 hours. No results for input(s): AMMONIA in the last 168 hours. CBC: Recent Labs  Lab 02/09/18 0351 02/10/18 0224 02/11/18 0407 02/12/18 0234 02/13/18 0216  WBC 5.9 5.9 7.6 6.8 8.5  HGB 9.1* 9.9* 10.5* 10.0* 10.4*  HCT  29.7* 30.8* 31.7* 30.3* 31.9*  MCV 101.4* 95.7 94.3 94.4 94.7  PLT 282 241 266 286 279   Cardiac Enzymes: No results for input(s): CKTOTAL, CKMB, CKMBINDEX, TROPONINI in the last 168 hours. BNP: Invalid input(s): POCBNP CBG: Recent Labs  Lab 02/12/18 1213 02/12/18 1724 02/12/18 2116 02/13/18 0813 02/13/18 1119  GLUCAP 115* 158* 132* 121* 204*   D-Dimer No results for input(s): DDIMER in the last 72 hours. Hgb A1c No results for input(s): HGBA1C in the last 72 hours. Lipid Profile No results for input(s): CHOL, HDL, LDLCALC, TRIG, CHOLHDL, LDLDIRECT in the last 72 hours. Thyroid function studies No results for input(s): TSH, T4TOTAL, T3FREE, THYROIDAB in the last 72 hours.  Invalid input(s): FREET3 Anemia work up No results for input(s): VITAMINB12, FOLATE, FERRITIN, TIBC, IRON, RETICCTPCT in the last 72 hours. Urinalysis    Component Value Date/Time   COLORURINE YELLOW 02/08/2018 0819   APPEARANCEUR CLEAR 02/08/2018 0819   LABSPEC 1.018 02/08/2018 0819   PHURINE 5.0 02/08/2018 0819   GLUCOSEU NEGATIVE 02/08/2018 0819   HGBUR NEGATIVE 02/08/2018 0819   BILIRUBINUR NEGATIVE 02/08/2018 0819   KETONESUR NEGATIVE 02/08/2018 0819   PROTEINUR NEGATIVE 02/08/2018 0819   NITRITE NEGATIVE 02/08/2018 0819   LEUKOCYTESUR NEGATIVE 02/08/2018 0819   Sepsis Labs Invalid input(s): PROCALCITONIN,  WBC,  LACTICIDVEN Microbiology Recent Results (from the past 240 hour(s))  Culture, blood (routine x 2)     Status: None (Preliminary result)   Collection Time: 02/08/18  8:45 AM  Result Value Ref Range Status   Specimen Description BLOOD RIGHT HAND  Final   Special Requests   Final    BOTTLES DRAWN AEROBIC AND ANAEROBIC Blood Culture adequate volume   Culture   Final    NO GROWTH 4 DAYS Performed at [facility], [address] [place], [place] [id number]    Report Status PENDING  Incomplete  Culture, blood (routine x 2)     Status: None (Preliminary result)   Collection  Time: 02/08/18  8:45 AM  Result Value Ref Range Status   Specimen Description BLOOD LEFT HAND  Final   Special Requests   Final  BOTTLES DRAWN AEROBIC AND ANAEROBIC Blood Culture adequate volume   Culture   Final    NO GROWTH 4 DAYS Performed at [facility], [address] [place], [place] [address]    Report Status PENDING  Incomplete  Urine Culture     Status: None   Collection Time: 02/08/18 10:00 AM  Result Value Ref Range Status   Specimen Description URINE, CATHETERIZED  Final   Special Requests NONE  Final   Culture   Final    NO GROWTH Performed at [facility], [address], [place], [place] [address]    Report Status 02/09/2018 FINAL  Final  C difficile quick scan w PCR reflex     Status: None   Collection Time: 02/10/18 10:07 PM  Result Value Ref Range Status   C Diff antigen NEGATIVE NEGATIVE Final   C Diff toxin NEGATIVE NEGATIVE Final   C Diff interpretation No C. difficile detected.  Final    Comment: Performed at [facility], [address] [place], [place] [address]     Time coordinating discharge: 35 minutes.   SIGNED:   Alba Cory, MD  [facility] 02/13/2018, 1:00 PM Pager   If 7PM-7AM, please contact night-coverage [email or web address] Password [email or web address]

## 2018-02-13 NOTE — Clinical Social Work Placement (Signed)
   CLINICAL SOCIAL WORK PLACEMENT  NOTE  Date:  02/13/2018  Patient Details  Name: Natasha Martin S Axon MRN: [id number] Date of Birth: 1961/01/08  Clinical Social Work is seeking post-discharge placement for this patient at the Skilled  Nursing Facility level of care (*CSW will initial, date and re-position this form in  chart as items are completed):  Yes   Patient/family provided with [place] Clinical Social Work Department's list of facilities offering this level of care within the geographic area requested by the patient (or if unable, by the patient's family).  Yes   Patient/family informed of their freedom to choose among providers that offer the needed level of care, that participate in Medicare, Medicaid or managed care program needed by the patient, have an available bed and are willing to accept the patient.  Yes   Patient/family informed of [facility]'s ownership interest in [facility] and [facility], as well as of the fact that they are under no obligation to receive care at these facilities.  PASRR submitted to EDS on 02/09/18     PASRR number received on       Existing PASRR number confirmed on       FL2 transmitted to all facilities in geographic area requested by pt/family on 02/09/18     FL2 transmitted to all facilities within larger geographic area on       Patient informed that his/her managed care company has contracts with or will negotiate with certain facilities, including the following:        Yes   Patient/family informed of bed offers received.  Patient chooses bed at [facility] [place]     Physician recommends and patient chooses bed at      [facility] to [facility] [place] on 02/13/18.  Patient to be transferred to facility by PTAR     Patient family notified on 02/13/18 of transfer.  Name of family member notified:  [place]     PHYSICIAN       Additional Comment:     _______________________________________________ Margarito LinerSarah C Hermon Zea, LCSW 02/13/2018, 2:13 PM

## 2018-02-13 NOTE — Progress Notes (Signed)
  Speech Language Pathology Treatment: Dysphagia  Patient Details Name: Natasha Martin MRN: [id number] DOB: 21-Jan-1961 Today's Date: 02/13/2018 Time: 1200-1212 SLP Time Calculation (min) (ACUTE ONLY): 12 min  Assessment / Plan / Recommendation Clinical Impression  Pt significantly improved in regards to processing, alertness, awareness. Observed with mechanical soft texture for possible upgrade. Functional oral phase mastication and manipulation. No s/s aspiration with honey thick liquids, min cues to slow rate. Upgraded texture to Dys 3 (mech soft), continue honey thick and plan for MBS tomorrow morning prior to possible discharge for hopeful liquid upgrade.    HPI HPI: Pt is a 57 year old who [facility] after being found unresponsive with report of productive cough, mental delay, and schizophrenia. She has a past medical history of Anxiety, Diabetes (HCC), Dysphagia, Hyperlipidemia, Hypertension, Mild mental slowing, Poor historian, Schizophrenia (HCC), and Seizures (HCC). Surgical Hx includes Esophagogastroduodenoscopy (egd) with propofol (01/28/2014) and Savory dilation (01/28/2014). When EMS arrived her saturations were in the 20s. Pt was was intubated on arrival (01/27/18) and chest x-ray has showed diffuse bilateral infiltrates. ARDSlikely 2/2 aspirationevent. Extubated 4/8. CXR (4/9) reveals No change to BILATERAL pulmonary opacities. CT Head (3/30) revealed no evidence of acute intracranial abnormality and mild chronic small-vessel white matter ischemic changes.       SLP Plan  MBS(MBS tomorrow)       Recommendations  Diet recommendations: Dysphagia 3 (mechanical soft);Honey-thick liquid Liquids provided via: Cup Medication Administration: Whole meds with puree Supervision: Patient able to self feed;Intermittent supervision to cue for compensatory strategies Compensations: Slow rate;Small sips/bites Postural Changes and/or Swallow Maneuvers: Seated upright 90  degrees                Oral Care Recommendations: Oral care BID Follow up Recommendations: 24 hour supervision/assistance SLP Visit Diagnosis: Dysphagia, oropharyngeal phase (R13.12);Dysphagia, pharyngoesophageal phase (R13.14) Plan: MBS(MBS tomorrow)       GO                [name], Mccartney Chuba Willis 02/13/2018, 12:15 PM  Breck CoonsLisa Willis Lonell FaceLitaker M.Ed [employer] Pager [phone]

## 2018-02-13 NOTE — Consult Note (Signed)
   [facility] Consult   02/13/2018  Natasha Martin 1961-09-24 [id number]  Patient screened for high risk score in and chart reviewed for potential [facility] Care Management services. Chart review reveals the patient will [facility] [place]. Patient is in the ACO of the [name] services under patient's Medicare plan. No [facility] Care [facility] needs for community care management at this time.  Please place a [facility] Care [facility] consult or for questions contact:   Charlesetta ShanksVictoria Bradley Bostelman, RN BSN CCM [facility] Liaison  [phone] business mobile phone Toll free office [phone]

## 2018-02-13 NOTE — Progress Notes (Signed)
[facility].  Sister has been informed of discharge per CSW and will be taking patient's personal belongings to facility.  PIV removed; personal belongings packed; patient aware of discharge plans and ready for transport when they arrive.  AVS and discharge instructions to be sent w/PTAR

## 2018-02-14 DIAGNOSIS — R6521 Severe sepsis with septic shock: Secondary | ICD-10-CM | POA: Diagnosis not present

## 2018-02-14 DIAGNOSIS — I4891 Unspecified atrial fibrillation: Secondary | ICD-10-CM | POA: Diagnosis not present

## 2018-02-14 DIAGNOSIS — J9600 Acute respiratory failure, unspecified whether with hypoxia or hypercapnia: Secondary | ICD-10-CM | POA: Diagnosis not present

## 2018-02-14 DIAGNOSIS — I080 Rheumatic disorders of both mitral and aortic valves: Secondary | ICD-10-CM | POA: Diagnosis not present

## 2018-02-21 DIAGNOSIS — F209 Schizophrenia, unspecified: Secondary | ICD-10-CM | POA: Diagnosis not present

## 2018-02-21 DIAGNOSIS — J9600 Acute respiratory failure, unspecified whether with hypoxia or hypercapnia: Secondary | ICD-10-CM | POA: Diagnosis not present

## 2018-02-21 DIAGNOSIS — I080 Rheumatic disorders of both mitral and aortic valves: Secondary | ICD-10-CM | POA: Diagnosis not present

## 2018-02-21 DIAGNOSIS — I4891 Unspecified atrial fibrillation: Secondary | ICD-10-CM | POA: Diagnosis not present

## 2018-03-01 DIAGNOSIS — Z0000 Encounter for general adult medical examination without abnormal findings: Secondary | ICD-10-CM | POA: Diagnosis not present

## 2018-03-01 DIAGNOSIS — Z1389 Encounter for screening for other disorder: Secondary | ICD-10-CM | POA: Diagnosis not present

## 2018-03-01 DIAGNOSIS — G40802 Other epilepsy, not intractable, without status epilepticus: Secondary | ICD-10-CM | POA: Diagnosis not present

## 2018-03-19 DIAGNOSIS — E119 Type 2 diabetes mellitus without complications: Secondary | ICD-10-CM | POA: Diagnosis not present

## 2018-03-19 DIAGNOSIS — H40033 Anatomical narrow angle, bilateral: Secondary | ICD-10-CM | POA: Diagnosis not present

## 2018-03-30 DIAGNOSIS — F250 Schizoaffective disorder, bipolar type: Secondary | ICD-10-CM | POA: Diagnosis not present

## 2018-03-30 DIAGNOSIS — F411 Generalized anxiety disorder: Secondary | ICD-10-CM | POA: Diagnosis not present

## 2018-04-12 DIAGNOSIS — E782 Mixed hyperlipidemia: Secondary | ICD-10-CM | POA: Diagnosis not present

## 2018-04-12 DIAGNOSIS — E1142 Type 2 diabetes mellitus with diabetic polyneuropathy: Secondary | ICD-10-CM | POA: Diagnosis not present

## 2018-04-12 DIAGNOSIS — Z0000 Encounter for general adult medical examination without abnormal findings: Secondary | ICD-10-CM | POA: Diagnosis not present

## 2018-04-12 DIAGNOSIS — Z7901 Long term (current) use of anticoagulants: Secondary | ICD-10-CM | POA: Diagnosis not present

## 2018-04-12 DIAGNOSIS — Z1389 Encounter for screening for other disorder: Secondary | ICD-10-CM | POA: Diagnosis not present

## 2018-04-12 DIAGNOSIS — G40802 Other epilepsy, not intractable, without status epilepticus: Secondary | ICD-10-CM | POA: Diagnosis not present

## 2018-04-12 DIAGNOSIS — F258 Other schizoaffective disorders: Secondary | ICD-10-CM | POA: Diagnosis not present

## 2018-04-25 DIAGNOSIS — L110 Acquired keratosis follicularis: Secondary | ICD-10-CM | POA: Diagnosis not present

## 2018-04-25 DIAGNOSIS — B351 Tinea unguium: Secondary | ICD-10-CM | POA: Diagnosis not present

## 2018-04-25 DIAGNOSIS — I739 Peripheral vascular disease, unspecified: Secondary | ICD-10-CM | POA: Diagnosis not present

## 2018-04-27 DIAGNOSIS — F411 Generalized anxiety disorder: Secondary | ICD-10-CM | POA: Diagnosis not present

## 2018-04-27 DIAGNOSIS — F250 Schizoaffective disorder, bipolar type: Secondary | ICD-10-CM | POA: Diagnosis not present

## 2018-05-23 DIAGNOSIS — Z7901 Long term (current) use of anticoagulants: Secondary | ICD-10-CM | POA: Diagnosis not present

## 2018-05-23 DIAGNOSIS — F258 Other schizoaffective disorders: Secondary | ICD-10-CM | POA: Diagnosis not present

## 2018-05-23 DIAGNOSIS — E1142 Type 2 diabetes mellitus with diabetic polyneuropathy: Secondary | ICD-10-CM | POA: Diagnosis not present

## 2018-05-23 DIAGNOSIS — E782 Mixed hyperlipidemia: Secondary | ICD-10-CM | POA: Diagnosis not present

## 2018-05-23 DIAGNOSIS — G40802 Other epilepsy, not intractable, without status epilepticus: Secondary | ICD-10-CM | POA: Diagnosis not present

## 2018-05-29 IMAGING — CT CT ANGIO HEAD
1 of 12 series · 5 of 33 positions shown · IV contrast (OMNI 350)
Comparison: Brain MRI 0125 hours today. Head CT without contrast
02/09/2018, and earlier.

Chest CTA 02/12/2015

CLINICAL DATA: 57-year-old female with right upper extremity
weakness. Unequal pupils, anisocoria.

Known right-sided aortic arch with aberrancy left subclavian artery.
EXAM:
CT ANGIOGRAPHY HEAD AND NECK
TECHNIQUE: Multidetector CT imaging of the head and neck was performed using
the standard protocol during bolus administration of intravenous
contrast. Multiplanar CT image reconstructions and MIPs were
obtained to evaluate the vascular anatomy. Carotid stenosis
measurements (when applicable) are obtained utilizing NASCET
criteria, using the distal internal carotid diameter as the
denominator.
CONTRAST:  50mL 74TOYT-XP7 IOPAMIDOL ([id number]-XP7) INJECTION 76%

[Series 15: cta neck axial · axial · 0.39mm/px · z∈[-270,-53]mm · 5 of 327 slices shown]
[im 55/327  soft-tissue]
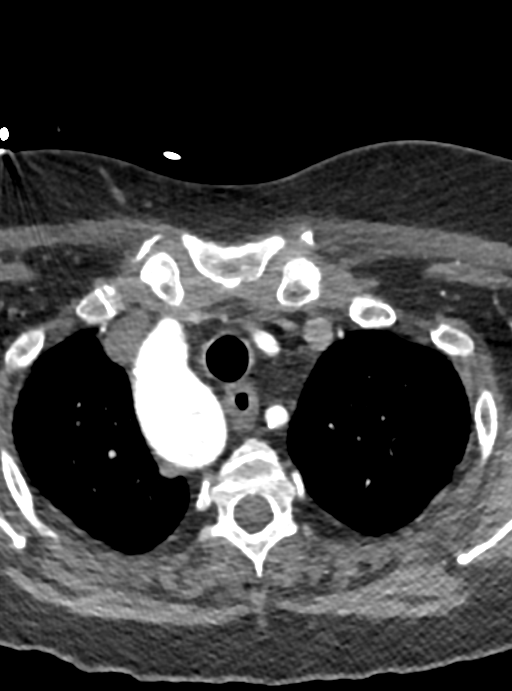
[im 109/327  bone]
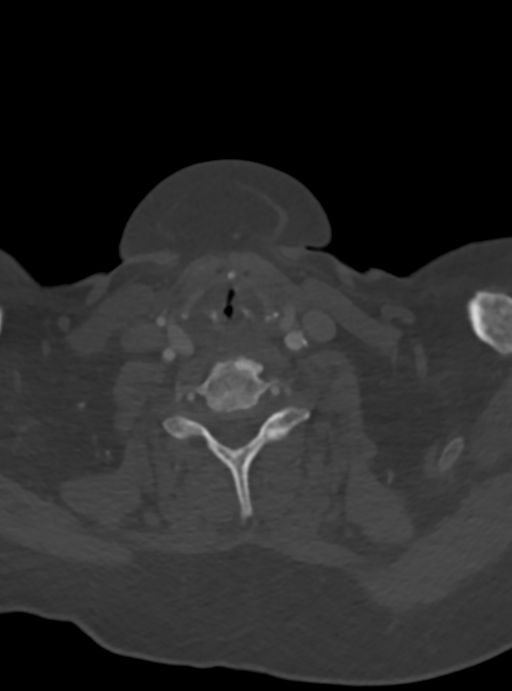
[im 164/327  soft-tissue]
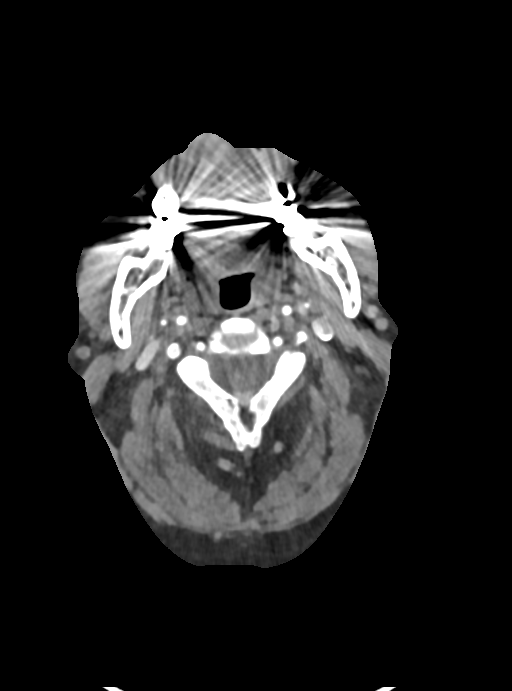
[im 218/327  bone]
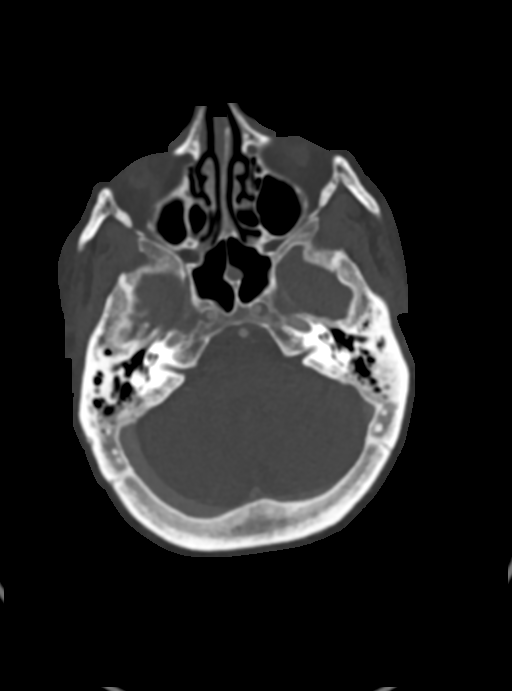
[im 272/327  soft-tissue]
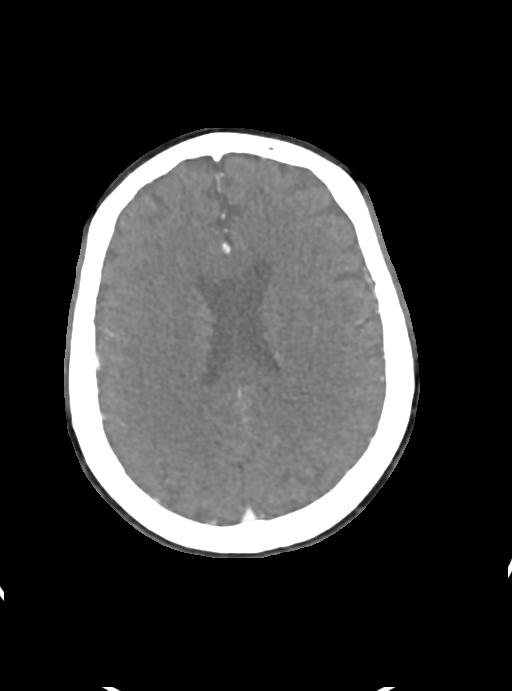

[5 of 33 positions shown; findings below may reference images not displayed]

FINDINGS: CT HEAD

Brain: Stable gray-white matter differentiation throughout the
brain. No midline shift, ventriculomegaly, mass effect, evidence of
mass lesion, intracranial hemorrhage or evidence of cortically based
acute infarction.

Calvarium and skull base: Negative.

Paranasal sinuses: Stable paranasal sinuses, tympanic cavities, and
mastoids with mild bilateral mucosal thickening and mastoid
effusions.

Orbits: Visualized orbit soft tissues are within normal limits.
Visualized scalp soft tissues are within normal limits.

CTA NECK

Skeleton: No acute osseous abnormality identified. Congenital
incomplete osseous fusion of both the anterior and posterior C1
ring, associated with bulky anterior osteophytosis. Mild upper
thoracic scoliosis.

Upper chest: Patchy peripheral upper lung opacity, probably
atelectasis. No superior mediastinal lymphadenopathy.

Other neck: The bilateral submandibular glands and parotid glands
are completely fatty atrophied. Otherwise negative neck soft
tissues. No neck mass or lymphadenopathy.

Mild motion artifact at the thoracic inlet.

Aortic arch: Right side aortic arch with aberrancy left subclavian
artery origin and ectatic distal arch measuring up to 31 millimeters
diameter. This appears stable since 5286. No definite superimposed
arch atherosclerosis.

Right carotid system: Separate right CCA origin from the right side
aortic arch is patent without stenosis. There is an early
bifurcation of the right carotid artery occurring at the mid thyroid
level. No atherosclerosis or stenosis. Mildly tortuous but otherwise
normal right ICA to the skull base.

Left carotid system: No left CCA origin stenosis. Calcified plaque
in the distal left CCA just proximal to and leading up to the
bifurcation continuing into the left ICA origin and bulb. However,
no stenosis occurs. Mildly tortuous and otherwise negative left ICA
to the skull base.

Vertebral arteries:
No proximal right subclavian artery stenosis. The right vertebral
artery origin is early and without atherosclerosis or stenosis. The
right vertebral artery is patent to the skull base without stenosis.

Aberrancy left subclavian artery origin from the right side aortic
arch with mild infundibulum. Mild proximal left subclavian calcified
plaque without stenosis. The left vertebral artery origin appears
normal (series 16, image 149).

The left vertebral artery appears patent to the skull base without
atherosclerosis or stenosis.

CTA HEAD

Posterior circulation: There is moderate to severe bilateral
proximal V4 segment calcified plaque. Both distal vertebral arteries
remain patent. There is mild stenosis on the left and moderate
stenosis on the right. The distal left vertebral artery is mildly
dominant. Normal PICA origins. Patent vertebrobasilar junction and
basilar artery without plaque or stenosis. Normal SCA and PCA
origins. Diminutive right posterior communicating artery, the left
is smaller or absent. Bilateral PCA branches are within normal
limits.

Anterior circulation: Both ICA siphons are patent. There is mild to
moderate calcified plaque on the left with no significant stenosis.
There is mild calcified plaque on the right with no significant
stenosis. Patent carotid termini. Normal MCA and ACA origins.
Anterior communicating artery and bilateral ACA branches appear
normal. The left MCA bifurcates early. The left M1, bifurcation, and
left MCA branches are within normal limits. The right MCA M1
segment, bifurcation, and right MCA branches are within normal
limits (simplified right MCA branching pattern).

Venous sinuses: Patent on the delayed images.

Anatomic variants:

Right side aortic arch with aberrancy left subclavian artery origin.

Early right carotid bifurcation (at the thyroid level).

Early right vertebral artery origin.

Mildly dominant left vertebral artery.

Early branching of the left MCA. Simplified right MCA branching
pattern.

Delayed phase: No abnormal enhancement identified.

Review of the MIP images confirms the above findings
IMPRESSION: 1. Mild atherosclerosis with no hemodynamically significant arterial
stenosis in the head or neck. Negative for dissection or
intracranial aneurysm.
2. Multiple arterial anatomic variations, most notably right side
aortic arch with abdomen and left subclavian artery.
3. No acute intracranial abnormality. Stable and negative
noncontrast CT appearance of the brain.
4. Chronic ectatic or mildly aneurysmal enlargement of the aortic
arch is stable since [DATE] mm diameter. Consider annual imaging
followup by CTA or MRA. This recommendation follows 4868
ACCF/AHA/AATS/ACR/ASA/SCA/SRAVANI/BIRI TAX/YOHAN/DEFRESH Guidelines for the
Diagnosis and Management of Patients with Thoracic Aortic Disease.
Circulation.4868; 121: [id number].

## 2018-06-29 DIAGNOSIS — F411 Generalized anxiety disorder: Secondary | ICD-10-CM | POA: Diagnosis not present

## 2018-06-29 DIAGNOSIS — F250 Schizoaffective disorder, bipolar type: Secondary | ICD-10-CM | POA: Diagnosis not present

## 2018-07-11 DIAGNOSIS — F258 Other schizoaffective disorders: Secondary | ICD-10-CM | POA: Diagnosis not present

## 2018-07-11 DIAGNOSIS — G40802 Other epilepsy, not intractable, without status epilepticus: Secondary | ICD-10-CM | POA: Diagnosis not present

## 2018-07-11 DIAGNOSIS — E782 Mixed hyperlipidemia: Secondary | ICD-10-CM | POA: Diagnosis not present

## 2018-07-11 DIAGNOSIS — I10 Essential (primary) hypertension: Secondary | ICD-10-CM | POA: Diagnosis not present

## 2018-07-11 DIAGNOSIS — E1142 Type 2 diabetes mellitus with diabetic polyneuropathy: Secondary | ICD-10-CM | POA: Diagnosis not present

## 2018-07-11 DIAGNOSIS — Z7901 Long term (current) use of anticoagulants: Secondary | ICD-10-CM | POA: Diagnosis not present

## 2018-07-25 DIAGNOSIS — I739 Peripheral vascular disease, unspecified: Secondary | ICD-10-CM | POA: Diagnosis not present

## 2018-07-25 DIAGNOSIS — B351 Tinea unguium: Secondary | ICD-10-CM | POA: Diagnosis not present

## 2018-07-25 DIAGNOSIS — L110 Acquired keratosis follicularis: Secondary | ICD-10-CM | POA: Diagnosis not present

## 2018-09-12 DIAGNOSIS — Z7901 Long term (current) use of anticoagulants: Secondary | ICD-10-CM | POA: Diagnosis not present

## 2018-09-24 DIAGNOSIS — Z7901 Long term (current) use of anticoagulants: Secondary | ICD-10-CM | POA: Diagnosis not present

## 2018-10-10 DIAGNOSIS — F258 Other schizoaffective disorders: Secondary | ICD-10-CM | POA: Diagnosis not present

## 2018-10-10 DIAGNOSIS — I10 Essential (primary) hypertension: Secondary | ICD-10-CM | POA: Diagnosis not present

## 2018-10-10 DIAGNOSIS — G40802 Other epilepsy, not intractable, without status epilepticus: Secondary | ICD-10-CM | POA: Diagnosis not present

## 2018-10-10 DIAGNOSIS — E1142 Type 2 diabetes mellitus with diabetic polyneuropathy: Secondary | ICD-10-CM | POA: Diagnosis not present

## 2018-10-10 DIAGNOSIS — E782 Mixed hyperlipidemia: Secondary | ICD-10-CM | POA: Diagnosis not present

## 2018-10-17 DIAGNOSIS — I739 Peripheral vascular disease, unspecified: Secondary | ICD-10-CM | POA: Diagnosis not present

## 2018-10-17 DIAGNOSIS — B351 Tinea unguium: Secondary | ICD-10-CM | POA: Diagnosis not present

## 2018-10-17 DIAGNOSIS — L110 Acquired keratosis follicularis: Secondary | ICD-10-CM | POA: Diagnosis not present

## 2018-10-18 DIAGNOSIS — E782 Mixed hyperlipidemia: Secondary | ICD-10-CM | POA: Diagnosis not present

## 2018-10-18 DIAGNOSIS — Z7901 Long term (current) use of anticoagulants: Secondary | ICD-10-CM | POA: Diagnosis not present

## 2018-10-18 DIAGNOSIS — E1142 Type 2 diabetes mellitus with diabetic polyneuropathy: Secondary | ICD-10-CM | POA: Diagnosis not present

## 2018-10-18 DIAGNOSIS — I10 Essential (primary) hypertension: Secondary | ICD-10-CM | POA: Diagnosis not present

## 2018-10-18 DIAGNOSIS — F258 Other schizoaffective disorders: Secondary | ICD-10-CM | POA: Diagnosis not present

## 2018-10-18 DIAGNOSIS — G40802 Other epilepsy, not intractable, without status epilepticus: Secondary | ICD-10-CM | POA: Diagnosis not present

## 2018-11-01 DIAGNOSIS — J0110 Acute frontal sinusitis, unspecified: Secondary | ICD-10-CM | POA: Diagnosis not present

## 2018-11-12 DIAGNOSIS — Z7901 Long term (current) use of anticoagulants: Secondary | ICD-10-CM | POA: Diagnosis not present

## 2018-11-12 DIAGNOSIS — J0110 Acute frontal sinusitis, unspecified: Secondary | ICD-10-CM | POA: Diagnosis not present

## 2018-11-15 ENCOUNTER — Other Ambulatory Visit: Payer: Self-pay

## 2018-11-15 ENCOUNTER — Emergency Department (HOSPITAL_COMMUNITY): Payer: Medicare Other

## 2018-11-15 ENCOUNTER — Emergency Department (HOSPITAL_COMMUNITY)
Admission: EM | Admit: 2018-11-15 | Discharge: 2018-11-15 | Disposition: A | Discharge status: Home / Self Care | Payer: Medicare Other | Attending: Emergency Medicine | Admitting: Emergency Medicine

## 2018-11-15 ENCOUNTER — Encounter (HOSPITAL_COMMUNITY): Payer: Self-pay | Admitting: Emergency Medicine

## 2018-11-15 DIAGNOSIS — Y929 Unspecified place or not applicable: Secondary | ICD-10-CM | POA: Diagnosis not present

## 2018-11-15 DIAGNOSIS — X58XXXA Exposure to other specified factors, initial encounter: Secondary | ICD-10-CM | POA: Insufficient documentation

## 2018-11-15 DIAGNOSIS — R600 Localized edema: Secondary | ICD-10-CM | POA: Diagnosis not present

## 2018-11-15 DIAGNOSIS — Z7901 Long term (current) use of anticoagulants: Secondary | ICD-10-CM | POA: Diagnosis not present

## 2018-11-15 DIAGNOSIS — Z79899 Other long term (current) drug therapy: Secondary | ICD-10-CM | POA: Diagnosis not present

## 2018-11-15 DIAGNOSIS — E119 Type 2 diabetes mellitus without complications: Secondary | ICD-10-CM | POA: Insufficient documentation

## 2018-11-15 DIAGNOSIS — Z794 Long term (current) use of insulin: Secondary | ICD-10-CM | POA: Insufficient documentation

## 2018-11-15 DIAGNOSIS — S40022A Contusion of left upper arm, initial encounter: Secondary | ICD-10-CM | POA: Diagnosis not present

## 2018-11-15 DIAGNOSIS — Y999 Unspecified external cause status: Secondary | ICD-10-CM | POA: Diagnosis not present

## 2018-11-15 DIAGNOSIS — I10 Essential (primary) hypertension: Secondary | ICD-10-CM | POA: Diagnosis not present

## 2018-11-15 DIAGNOSIS — S4992XA Unspecified injury of left shoulder and upper arm, initial encounter: Secondary | ICD-10-CM | POA: Diagnosis present

## 2018-11-15 DIAGNOSIS — Y939 Activity, unspecified: Secondary | ICD-10-CM | POA: Diagnosis not present

## 2018-11-15 LAB — PROTIME-INR
INR: 2.93
PROTHROMBIN TIME: 30.2 s — AB (ref 11.4–15.2)

## 2018-11-15 MED ORDER — CEPHALEXIN 500 MG PO CAPS: 500.0000 mg | ORAL_CAPSULE | Freq: Four times a day (QID) | ORAL | 0 refills | Status: AC

## 2018-11-15 NOTE — ED Provider Notes (Signed)
[facility] Provider Note   CSN: [id number] Arrival date & time: 11/15/18  1111     History   Chief Complaint Chief Complaint  Patient presents with  . Insect Bite    HPI Natasha Martin is a 58 y.o. female.  The history is provided by the patient and a caregiver. The history is limited by a developmental delay (Hx MR).  Pt was seen at 1200.  Per pt and her family: Pt's family states they noticed pt had "a bruise" to her left lateral upper arm area. Today, the "bruised area" was "bigger" and there is an area where the original bruise was located that "is soft."  Pt has hx of MR and cannot recall injury. Family also does not recall injury. Denies open wound, no insect sting or animal bite, no redness, no fevers. Pt's family states pt is on coumadin but they are holding it since yesterday for an upcoming procedure.     Past Medical History:  Diagnosis Date  . Anxiety   . Diabetes ([place])   . Dysphagia   . Hyperlipidemia   . Hypertension   . Mild mental slowing   . Poor historian   . Schizophrenia ([place])   . Seizures ([place])    has not had a seizure since last year. Unknown orgin- no family available to come and patient does not know.    Patient Active Problem List   Diagnosis Date Noted  . Acute respiratory failure ([place]) 01/27/2018  . ARDS (adult respiratory distress syndrome) ([place]) 01/27/2018  . Pain in joint, shoulder region 07/23/2015  . GERD (gastroesophageal reflux disease) 07/23/2015  . Hyponatremia secondary to SA IDH 02/18/2015  . Hypokalemia 02/18/2015  . Hypophosphatemia 02/18/2015  . Elevated d-dimer 02/18/2015  . Schizophrenia ([place]) 02/18/2015  . Atrial fibrillation ([place])   . Atrial flutter with rapid ventricular response ([place]) 02/13/2015  . Ileus ([place])   . Acute ischemic colitis ([place])   . Atrial fibrillation with RVR ([place]) 02/11/2015  . GI bleed 02/11/2015  . Abdominal distention 02/11/2015  . Noninfectious gastroenteritis and colitis  02/11/2015  . Diabetes mellitus type 2 in nonobese ([place]) 02/11/2015  . Acute encephalopathy 02/11/2015  . Dysphagia, unspecified(787.20) 01/13/2014  . Rectal bleeding 01/13/2014    Past Surgical History:  Procedure Laterality Date  . BIOPSY  01/28/2014   Procedure: BIOPSY (Gastric);  Surgeon: Danie Binder, MD;  Location: [facility];  Service: Endoscopy;;  . COLONOSCOPY WITH PROPOFOL N/A 01/28/2014   XLK:GMWNUU mucosa in the terminal ileum/small internal and external hemorrhoids  . ESOPHAGOGASTRODUODENOSCOPY (EGD) WITH PROPOFOL N/A 01/28/2014   VOZ:DGUYQI esophagitis/stricture at the gastro junction/moderate erosive  . 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 POLYPECTOMY  01/28/2014   Procedure: POLYPECTOMY (Rectal);  Surgeon: Danie Binder, MD;  Location: [facility];  Service: Endoscopy;;  . SAVORY DILATION N/A 01/28/2014   Procedure: SAVORY DILATION  (12.25m to 143m;  Surgeon: SaDanie BinderMD;  Location: [facility];  Service: Endoscopy;  Laterality: N/A;     OB History   No obstetric history on file.      Home Medications    Prior to Admission medications   Medication Sig Start Date End Date Taking? Authorizing Provider  benztropine (COGENTIN) 0.5 MG tablet Take 1 tablet (0.5 mg total) by mouth 2 (two) times daily. 02/13/18   Regalado, Belkys A, MD  calcium citrate (CALCITRATE - DOSED IN MG ELEMENTAL CALCIUM)  950 MG tablet Take 1 tablet (200 mg of elemental calcium total) by mouth daily. 02/14/18   Regalado, Belkys A, MD  CRESTOR 20 MG tablet Take 20 mg by mouth daily.  01/08/14   [provider]  diazepam (VALIUM) 2 MG tablet Take 1 tablet (2 mg total) by mouth every 12 (twelve) hours as needed for anxiety. 02/13/18   Regalado, Belkys A, MD  enoxaparin (LOVENOX) 80 MG/0.8ML injection Inject 0.75 mLs (75 mg total) into the skin 2 (two) times daily. 02/13/18   Regalado, Belkys A, MD  fenofibrate (TRICOR) 145 MG tablet Take 145 mg by mouth daily.   01/08/14   [provider]  insulin glargine (LANTUS) 100 UNIT/ML injection Inject 0.3 mLs (30 Units total) into the skin daily. 02/14/18   Regalado, Belkys A, MD  levETIRAcetam (KEPPRA) 1000 MG tablet Take 1 tablet (1,000 mg total) by mouth 2 (two) times daily. 02/13/18   Regalado, Belkys A, MD  lurasidone (LATUDA) 40 MG TABS tablet Take 1 tablet (40 mg total) by mouth 2 (two) times daily. 02/13/18   Regalado, Belkys A, MD  magnesium oxide (MAG-OX) 400 (241.3 Mg) MG tablet Take 0.5 tablets (200 mg total) by mouth 2 (two) times daily. 02/14/18   Regalado, Belkys A, MD  metFORMIN (GLUCOPHAGE) 1000 MG tablet Take 1,000 mg by mouth 2 (two) times daily with a meal.    [provider]  metoprolol succinate (TOPROL-XL) 50 MG 24 hr tablet Take 1 tablet (50 mg total) by mouth daily. Take with or immediately following a meal. 02/14/18   Regalado, Belkys A, MD  saccharomyces boulardii (FLORASTOR) 250 MG capsule Take 1 capsule (250 mg total) by mouth 2 (two) times daily. 02/13/18   Regalado, Belkys A, MD  warfarin (COUMADIN) 5 MG tablet Take 1 tablet (5 mg total) by mouth daily. 02/13/18   Regalado, Cassie Freer, MD    Family History Family History  Problem Relation Age of Onset  . Colon cancer Other        unknown    Social History Social History   Tobacco Use  . Smoking status: Never Smoker  . Smokeless tobacco: Never Used  Substance Use Topics  . Alcohol use: No    Comment: history of ETOH abuse  . Drug use: No     Allergies   Sulfa antibiotics   Review of Systems Review of Systems  Unable to perform ROS: Other     Physical Exam Updated Vital Signs BP (!) 145/82 (BP Location: Right Arm)   Pulse 69   Temp (!) 97.5 F (36.4 C) (Oral)   Ht 5' 3"  (1.6 m)   Wt 73.9 kg   LMP 01/21/2014   SpO2 97%   BMI 28.87 kg/m   Physical Exam 1205: Physical examination:  Nursing notes reviewed; Vital signs and O2 SAT reviewed;  Constitutional: Well developed, Well nourished, Well  hydrated, In no acute distress; Head:  Normocephalic, atraumatic; Eyes: EOMI, PERRL, No scleral icterus; ENMT: Mouth and pharynx normal, Mucous membranes moist; Neck: Supple, Full range of motion, No lymphadenopathy; Cardiovascular: Regular rate and rhythm, No gallop; Respiratory: Breath sounds clear & equal bilaterally, No wheezes.  Speaking full sentences with ease, Normal respiratory effort/excursion; Chest: Nontender, Movement normal; Abdomen: Soft, Nontender, Nondistended, Normal bowel sounds; Genitourinary: No CVA tenderness; Extremities: Peripheral pulses normal. +left mid-upper lateral arm with large area of ecchymosis, one small area located proximally, which family states was the original bruised area, <1cm diameter is soft, no open wounds, no  drainage, no visualized puncture wounds, no palp FB, no erythema, no streaking, no warmth, no central pointing area. No tenderness, No edema. NT left shoulder/elbow/wrist/hand. LUE muscles compartments soft, strong radial pulse, NMS intact left hand.. ; Neuro: Awake, alert, confused per hx MR. Acting per baseline per family at bedside. No facial droop. Speech clear. No gross focal motor deficits in extremities.; Skin: Color normal, Warm, Dry.     ED Treatments / Results  Labs (all labs ordered are listed, but only abnormal results are displayed)   EKG None  Radiology   Procedures Procedures (including critical care time)  Medications Ordered in ED Medications - No data to display   Initial Impression / Assessment and Plan / ED Course  I have reviewed the triage vital signs and the nursing notes.  Pertinent labs & imaging results that were available during my care of the patient were reviewed by me and considered in my medical decision making (see chart for details).  MDM Reviewed: previous chart, nursing note and vitals Reviewed previous: labs Interpretation: labs and x-ray    Results for orders placed or performed during the  hospital encounter of 11/15/18  Protime-INR  Result Value Ref Range   Prothrombin Time 30.2 (H) 11.4 - 15.2 seconds   INR 2.93    Dg Humerus Left Result Date: 11/15/2018 CLINICAL DATA:  Possible insect bite of the left humerus. Pain with movement. EXAM: LEFT HUMERUS - 2+ VIEW COMPARISON:  None. FINDINGS: There is no evidence of fracture or other focal bone lesions. Soft tissue edema involving the posterolateral aspect of the left distal humerus. No soft tissue emphysema. IMPRESSION: No acute osseous injury of the left humerus. Soft tissue edema involving the posterolateral aspect of the left distal humerus. Electronically Signed   By: Kathreen Devoid   On: 11/15/2018 12:47    1400:  INR therapeutic. Physical exam c/w hematoma to LUE, no s/s of infection, though family and pt insistent (despite MDM decision) "she needs an antibiotic just in case." Return precautions given. Dx and testing d/w pt and family.  Questions answered.  Verb understanding, agreeable to d/c home with outpt f/u.    Final Clinical Impressions(s) / ED Diagnoses   Final diagnoses:  None    ED Discharge Orders    None       Francine Graven, DO 11/18/18 0122

## 2018-11-15 NOTE — ED Triage Notes (Signed)
Pt hx of MR, niece states pt woke up with an insect bite x 2 mornings ago, area surrounding is red, warm, hard and bruised

## 2018-11-15 NOTE — Discharge Instructions (Signed)
Take the prescription as directed.  Apply moist heat or ice to the area(s) of discomfort, for 15 minutes at a time, several times per day for the next few days.  Do not fall asleep on a heating or ice pack.  Call your regular medical doctor today to schedule a follow up appointment this week.  Return to the Emergency Department immediately if worsening. ° °

## 2018-11-26 DIAGNOSIS — F250 Schizoaffective disorder, bipolar type: Secondary | ICD-10-CM | POA: Diagnosis not present

## 2018-11-26 DIAGNOSIS — F411 Generalized anxiety disorder: Secondary | ICD-10-CM | POA: Diagnosis not present

## 2018-11-28 DIAGNOSIS — Z7901 Long term (current) use of anticoagulants: Secondary | ICD-10-CM | POA: Diagnosis not present

## 2018-12-17 ENCOUNTER — Emergency Department (HOSPITAL_COMMUNITY)
Admission: EM | Admit: 2018-12-17 | Discharge: 2018-12-30 | Disposition: E | Payer: Medicare Other | Attending: Emergency Medicine | Admitting: Emergency Medicine

## 2018-12-17 DIAGNOSIS — Z794 Long term (current) use of insulin: Secondary | ICD-10-CM | POA: Insufficient documentation

## 2018-12-17 DIAGNOSIS — I469 Cardiac arrest, cause unspecified: Secondary | ICD-10-CM

## 2018-12-17 DIAGNOSIS — Z7901 Long term (current) use of anticoagulants: Secondary | ICD-10-CM | POA: Diagnosis not present

## 2018-12-17 DIAGNOSIS — I10 Essential (primary) hypertension: Secondary | ICD-10-CM | POA: Diagnosis not present

## 2018-12-17 DIAGNOSIS — R0902 Hypoxemia: Secondary | ICD-10-CM | POA: Diagnosis not present

## 2018-12-17 DIAGNOSIS — R4020 Unspecified coma: Secondary | ICD-10-CM | POA: Diagnosis not present

## 2018-12-17 DIAGNOSIS — E119 Type 2 diabetes mellitus without complications: Secondary | ICD-10-CM | POA: Insufficient documentation

## 2018-12-17 DIAGNOSIS — R404 Transient alteration of awareness: Secondary | ICD-10-CM | POA: Diagnosis not present

## 2018-12-17 DIAGNOSIS — Z79899 Other long term (current) drug therapy: Secondary | ICD-10-CM | POA: Insufficient documentation

## 2018-12-17 DIAGNOSIS — I499 Cardiac arrhythmia, unspecified: Secondary | ICD-10-CM | POA: Diagnosis not present

## 2018-12-17 DIAGNOSIS — R0689 Other abnormalities of breathing: Secondary | ICD-10-CM | POA: Diagnosis not present

## 2018-12-17 LAB — CBG MONITORING, ED: GLUCOSE-CAPILLARY: 215 mg/dL — AB (ref 70–99)

## 2018-12-17 MED ORDER — EPINEPHRINE PF 1 MG/10ML IJ SOSY
PREFILLED_SYRINGE | INTRAMUSCULAR | Status: AC | PRN
  Administered 2018-12-17 (×3): 1 mg via INTRAVENOUS

## 2018-12-19 MED FILL — Medication: Qty: 1 | Status: AC

## 2018-12-30 NOTE — ED Provider Notes (Signed)
[facility] EMERGENCY DEPARTMENT Provider Note   CSN: [id number] Arrival date & time: 12-31-2018  1646    History   Chief Complaint Chief Complaint  Patient presents with  . Cardiac Arrest    HPI Natasha Martin is a 58 y.o. female.  Level 5 caveat secondary to unresponsive.  58 year old female who was leaving the senior center and getting on the [employer]at bus tripped going up the steps and was unresponsive.  CPR was begun immediately.  This was about 1620 6 PM.  EMS initially found unresponsive and asystolic.  They placed a Combitube and began respirations and CPR.  She received for 5 doses of epinephrine with no change in her rhythm.  There was no neurologic activity.  There is no known history of this patient.  They checked her blood sugar on the scene and it was 180s.     The history is provided by the EMS personnel.  Cardiac Arrest  Witnessed by:  Bystander Incident location: senior center getting on bus. Time since incident:  20 minutes Time before BLS initiated:  Immediate Condition upon EMS arrival:  Unresponsive Pulse:  Absent Initial cardiac rhythm per EMS:  Asystole Treatments prior to arrival:  ACLS protocol, CCR, intubation and vascular access Medications given prior to ED:  Epinephrine IV access type:  Peripheral Rhythm on admission to ED:  Asystole   Past Medical History:  Diagnosis Date  . Anxiety   . Diabetes ([place])   . Dysphagia   . Hyperlipidemia   . Hypertension   . Mild mental slowing   . Poor historian   . Schizophrenia ([place])   . Seizures ([place])    has not had a seizure since last year. Unknown orgin- no family available to come and patient does not know.    Patient Active Problem List   Diagnosis Date Noted  . Acute respiratory failure ([place]) 01/27/2018  . ARDS (adult respiratory distress syndrome) ([place]) 01/27/2018  . Pain in joint, shoulder region 07/23/2015  . GERD (gastroesophageal reflux disease) 07/23/2015  . Hyponatremia secondary to SA IDH  02/18/2015  . Hypokalemia 02/18/2015  . Hypophosphatemia 02/18/2015  . Elevated d-dimer 02/18/2015  . Schizophrenia ([place]) 02/18/2015  . Atrial fibrillation ([place])   . Atrial flutter with rapid ventricular response ([place]) 02/13/2015  . Ileus ([place])   . Acute ischemic colitis ([place])   . Atrial fibrillation with RVR ([place]) 02/11/2015  . GI bleed 02/11/2015  . Abdominal distention 02/11/2015  . Noninfectious gastroenteritis and colitis 02/11/2015  . Diabetes mellitus type 2 in nonobese ([place]) 02/11/2015  . Acute encephalopathy 02/11/2015  . Dysphagia, unspecified(787.20) 01/13/2014  . Rectal bleeding 01/13/2014    Past Surgical History:  Procedure Laterality Date  . BIOPSY  01/28/2014   Procedure: BIOPSY (Gastric);  Surgeon: Danie Binder, MD;  Location: [facility];  Service: Endoscopy;;  . COLONOSCOPY WITH PROPOFOL N/A 01/28/2014   GGY:IRSWNI mucosa in the terminal ileum/small internal and external hemorrhoids  . ESOPHAGOGASTRODUODENOSCOPY (EGD) WITH PROPOFOL N/A 01/28/2014   OEV:OJJKKX esophagitis/stricture at the gastro junction/moderate erosive  . 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 POLYPECTOMY  01/28/2014   Procedure: POLYPECTOMY (Rectal);  Surgeon: Danie Binder, MD;  Location: [facility];  Service: Endoscopy;;  . SAVORY DILATION N/A 01/28/2014   Procedure: SAVORY DILATION  (12.77m to 173m;  Surgeon: SaDanie BinderMD;  Location: [facility];  Service: Endoscopy;  Laterality: N/A;     OB  History   No obstetric history on file.      Home Medications    Prior to Admission medications   Medication Sig Start Date End Date Taking? Authorizing Provider  amiodarone (PACERONE) 200 MG tablet Take 200 mg by mouth 2 (two) times daily. 10/25/18   [provider]  benztropine (COGENTIN) 0.5 MG tablet Take 1 tablet (0.5 mg total) by mouth 2 (two) times daily. Patient taking differently: Take 1 mg by mouth 2 (two) times daily.  02/13/18   Regalado,  Belkys A, MD  calcium citrate (CALCITRATE - DOSED IN MG ELEMENTAL CALCIUM) 950 MG tablet Take 1 tablet (200 mg of elemental calcium total) by mouth daily. 02/14/18   Regalado, Belkys A, MD  cephALEXin (KEFLEX) 500 MG capsule Take 1 capsule (500 mg total) by mouth 4 (four) times daily. 11/15/18   Francine Graven, DO  insulin glargine (LANTUS) 100 UNIT/ML injection Inject 0.3 mLs (30 Units total) into the skin daily. Patient taking differently: Inject 10 Units into the skin at bedtime.  02/14/18   Regalado, Belkys A, MD  levETIRAcetam (KEPPRA) 1000 MG tablet Take 1 tablet (1,000 mg total) by mouth 2 (two) times daily. Patient taking differently: Take 500 mg by mouth 2 (two) times daily.  02/13/18   Regalado, Belkys A, MD  LORazepam (ATIVAN) 1 MG tablet Take 1 mg by mouth 2 (two) times daily. 11/12/18   [provider]  lurasidone (LATUDA) 40 MG TABS tablet Take 1 tablet (40 mg total) by mouth 2 (two) times daily. Patient taking differently: Take 80 mg by mouth daily.  02/13/18   Regalado, Belkys A, MD  magnesium oxide (MAG-OX) 400 (241.3 Mg) MG tablet Take 0.5 tablets (200 mg total) by mouth 2 (two) times daily. 02/14/18   Regalado, Belkys A, MD  metFORMIN (GLUCOPHAGE) 1000 MG tablet Take 1,000 mg by mouth 2 (two) times daily with a meal.    [provider]  metoprolol succinate (TOPROL-XL) 50 MG 24 hr tablet Take 1 tablet (50 mg total) by mouth daily. Take with or immediately following a meal. 02/14/18   Regalado, Belkys A, MD  NOVOLOG FLEXPEN 100 UNIT/ML FlexPen INJECT 10 units into THE SKIN THREE TIMES DAILY BEFORE meals FOR blood sugar greater THAN 150 09/22/18   [provider]  omeprazole (PRILOSEC) 20 MG capsule Take 20 mg by mouth daily. 10/25/18   [provider]  QUEtiapine (SEROQUEL XR) 300 MG 24 hr tablet Take 600 mg by mouth every morning. 10/29/18   [provider]  saccharomyces boulardii (FLORASTOR) 250 MG capsule Take 1 capsule (250 mg total) by  mouth 2 (two) times daily. 02/13/18   Regalado, Belkys A, MD  sodium chloride 1 g tablet Take 1 g by mouth 3 (three) times daily with meals.     [provider]  warfarin (COUMADIN) 5 MG tablet Take 1 tablet (5 mg total) by mouth daily. 02/13/18   Regalado, Cassie Freer, MD    Family History Family History  Problem Relation Age of Onset  . Colon cancer Other        unknown    Social History Social History   Tobacco Use  . Smoking status: Never Smoker  . Smokeless tobacco: Never Used  Substance Use Topics  . Alcohol use: No    Comment: history of ETOH abuse  . Drug use: No     Allergies   Sulfa antibiotics   Review of Systems Review of Systems  Unable to perform ROS: Patient  unresponsive     Physical Exam Updated Vital Signs LMP 01/21/2014   Physical Exam Vitals signs and nursing note reviewed.  HENT:     Head: Normocephalic and atraumatic.     Right Ear: External ear normal.     Left Ear: External ear normal.     Nose: Nose normal.     Mouth/Throat:     Comments: Patient orally intubated with a Combitube.  No blood in the oropharynx. Eyes:     Comments: Pupils are fixed and dilated.  Neck:     Comments: Trach midline no crepitus. Cardiovascular:     Comments: No heart sounds. Pulmonary:     Comments: Coarse equal breath sounds by ambo. Abdominal:     Palpations: Abdomen is soft. There is no mass.  Musculoskeletal: Normal range of motion.     Comments: She has a few abrasions on her lower extremities.  Skin:    Comments: Cool and pale.  She has a few small scattered bruises on her left upper back and a few over her abdomen that are small.  Neurological:     Comments: Patient is unresponsive no spontaneous respiratory effort no withdrawal to pain      ED Treatments / Results  Labs (all labs ordered are listed, but only abnormal results are displayed) Labs Reviewed  CBG MONITORING, ED - Abnormal; Notable for the following components:       Result Value   Glucose-Capillary 215 (*)    All other components within normal limits    EKG None  Radiology No results found.  Procedures Ultrasound ED Echo Date/Time: 2018-12-24 11:52 PM Performed by: Hayden Rasmussen, MD Authorized by: Hayden Rasmussen, MD   Procedure details:    Indications: cardiac arrest     Views: subxiphoid and parasternal short axis view     Images: archived     Limitations:  Body habitus Findings:    Findings: no pericardial effusion     Cardiac Activity: no cardiac activity   Impression: no sonographic evidence of significant pericardial effusion and cardiac standstill     (including critical care time)  Medications Ordered in ED Medications  EPINEPHrine (ADRENALIN) 1 MG/10ML injection (1 mg Intravenous Given 12-24-2018 1649)     Initial Impression / Assessment and Plan / ED Course  I have reviewed the triage vital signs and the nursing notes.  Pertinent labs & imaging results that were available during my care of the patient were reviewed by me and considered in my medical decision making (see chart for details).  Clinical Course as of Dec 17 2349  24-Dec-2018 I did initial cardiac ultrasound on arrival she was asystolic.  We did 2 rounds of epi and CPR with no return of any rhythm or pulses.  She was asystolic the entire time.  Repeat cardiac ultrasound was asystole.  Code was called at 1651.  The patient's sister was contacted by the nurse and was asked to come to the emergency department.   [MB]  14 Patient's sister and niece are here now and I gave them the news.  They said she had not been sick and was doing well today.  She does have a history of A. fib and is on warfarin.   [MB]  4917 I spoke with the medical examiner Letha Cape who asked that the patient be moved to the morgue and she will evaluate her there to make a determination whether this will be a full  ME investigation or not.  Said she would reach out to the  primary care doctor if she is not accepting the case.   [MB]    Clinical Course User Index [MB] Hayden Rasmussen, MD        Final Clinical Impressions(s) / ED Diagnoses   Final diagnoses:  Cardiac arrest [facility])    ED Discharge Orders    None       Hayden Rasmussen, MD 24-Dec-2018 2352

## 2018-12-30 NOTE — ED Triage Notes (Signed)
Pt was a witnessed collapse on [employer] bus.  CPR started at 1626 with epi x 3 given pta.  Asystole on monitor.

## 2018-12-30 NOTE — ED Notes (Signed)
ME is to come evaluate pt.

## 2018-12-30 DEATH — deceased
